# Patient Record
Sex: Male | Born: 1950
Health system: Southern US, Community
[De-identification: ages and names within clinical notes are randomized; demographics above are authoritative.]

## PROBLEM LIST (undated history)

## (undated) DIAGNOSIS — G4733 Obstructive sleep apnea (adult) (pediatric): Secondary | ICD-10-CM

## (undated) DIAGNOSIS — M1990 Unspecified osteoarthritis, unspecified site: Secondary | ICD-10-CM

## (undated) DIAGNOSIS — R351 Nocturia: Secondary | ICD-10-CM

## (undated) DIAGNOSIS — G4730 Sleep apnea, unspecified: Secondary | ICD-10-CM

## (undated) DIAGNOSIS — E785 Hyperlipidemia, unspecified: Secondary | ICD-10-CM

## (undated) DIAGNOSIS — C801 Malignant (primary) neoplasm, unspecified: Secondary | ICD-10-CM

## (undated) DIAGNOSIS — E119 Type 2 diabetes mellitus without complications: Secondary | ICD-10-CM

## (undated) DIAGNOSIS — R7303 Prediabetes: Secondary | ICD-10-CM

## (undated) DIAGNOSIS — N471 Phimosis: Secondary | ICD-10-CM

## (undated) DIAGNOSIS — I10 Essential (primary) hypertension: Secondary | ICD-10-CM

## (undated) DIAGNOSIS — E7800 Pure hypercholesterolemia, unspecified: Secondary | ICD-10-CM

## (undated) DIAGNOSIS — Z973 Presence of spectacles and contact lenses: Secondary | ICD-10-CM

## (undated) HISTORY — PX: ROTATOR CUFF REPAIR: SHX139

## (undated) HISTORY — DX: Sleep apnea, unspecified: G47.30

## (undated) HISTORY — PX: APPENDECTOMY: SHX54

## (undated) HISTORY — DX: Malignant (primary) neoplasm, unspecified: C80.1

---

## 2001-01-27 ENCOUNTER — Ambulatory Visit (HOSPITAL_COMMUNITY): Admission: RE | Admit: 2001-01-27 | Discharge: 2001-01-27 | Payer: Self-pay | Admitting: General Surgery

## 2001-08-25 ENCOUNTER — Ambulatory Visit (HOSPITAL_COMMUNITY): Admission: RE | Admit: 2001-08-25 | Discharge: 2001-08-25 | Payer: Self-pay | Admitting: Family Medicine

## 2001-08-25 ENCOUNTER — Encounter: Payer: Self-pay | Admitting: Family Medicine

## 2002-05-18 ENCOUNTER — Ambulatory Visit (HOSPITAL_COMMUNITY): Admission: RE | Admit: 2002-05-18 | Discharge: 2002-05-18 | Payer: Self-pay | Admitting: Family Medicine

## 2002-05-18 ENCOUNTER — Encounter: Payer: Self-pay | Admitting: Family Medicine

## 2011-07-26 ENCOUNTER — Emergency Department (HOSPITAL_COMMUNITY)
Admission: EM | Admit: 2011-07-26 | Discharge: 2011-07-27 | Disposition: A | Discharge status: Home / Self Care | Attending: Emergency Medicine | Admitting: Emergency Medicine

## 2011-07-26 ENCOUNTER — Encounter (HOSPITAL_COMMUNITY): Payer: Self-pay | Admitting: *Deleted

## 2011-07-26 DIAGNOSIS — S40029A Contusion of unspecified upper arm, initial encounter: Secondary | ICD-10-CM

## 2011-07-26 DIAGNOSIS — E7800 Pure hypercholesterolemia, unspecified: Secondary | ICD-10-CM | POA: Insufficient documentation

## 2011-07-26 DIAGNOSIS — X58XXXA Exposure to other specified factors, initial encounter: Secondary | ICD-10-CM | POA: Insufficient documentation

## 2011-07-26 DIAGNOSIS — Z7982 Long term (current) use of aspirin: Secondary | ICD-10-CM | POA: Insufficient documentation

## 2011-07-26 DIAGNOSIS — I10 Essential (primary) hypertension: Secondary | ICD-10-CM | POA: Insufficient documentation

## 2011-07-26 DIAGNOSIS — S5010XA Contusion of unspecified forearm, initial encounter: Secondary | ICD-10-CM | POA: Insufficient documentation

## 2011-07-26 DIAGNOSIS — F17200 Nicotine dependence, unspecified, uncomplicated: Secondary | ICD-10-CM | POA: Insufficient documentation

## 2011-07-26 HISTORY — DX: Pure hypercholesterolemia, unspecified: E78.00

## 2011-07-26 HISTORY — DX: Prediabetes: R73.03

## 2011-07-26 HISTORY — DX: Essential (primary) hypertension: I10

## 2011-07-26 NOTE — ED Notes (Signed)
Pt states he was getting into the shower and noticed that his left elbow was swollen. Pt states has not happened before. Pt denies injury or pain, just when it happened left elbow felt tight.

## 2011-07-27 LAB — PROTIME-INR
INR: 0.93 (ref 0.00–1.49)
Prothrombin Time: 12.7 seconds (ref 11.6–15.2)

## 2011-07-27 LAB — CBC WITH DIFFERENTIAL/PLATELET
Basophils Absolute: 0 10*3/uL (ref 0.0–0.1)
Basophils Relative: 1 % (ref 0–1)
Eosinophils Absolute: 0.2 10*3/uL (ref 0.0–0.7)
Eosinophils Relative: 4 % (ref 0–5)
HCT: 43 % (ref 39.0–52.0)
MCH: 29.3 pg (ref 26.0–34.0)
MCHC: 33.5 g/dL (ref 30.0–36.0)
MCV: 87.6 fL (ref 78.0–100.0)
Monocytes Absolute: 0.4 10*3/uL (ref 0.1–1.0)
RDW: 14.2 % (ref 11.5–15.5)

## 2011-07-27 LAB — COMPREHENSIVE METABOLIC PANEL
AST: 20 U/L (ref 0–37)
Albumin: 3.9 g/dL (ref 3.5–5.2)
Calcium: 9.3 mg/dL (ref 8.4–10.5)
Creatinine, Ser: 0.87 mg/dL (ref 0.50–1.35)

## 2011-07-27 NOTE — ED Provider Notes (Signed)
History     CSN: [id number]  Arrival date & time 07/26/11  2224   None     Chief Complaint  Patient presents with  . Joint Swelling    (Consider location/radiation/quality/duration/timing/severity/associated sxs/prior treatment) HPI Comments: Patient is a small area of bruising on the medial aspect of his left upper arm and elbow swelling.  Tonight, when he was about to take a shower designs any pain or trauma.  He does take an aspirin a day as her regular medical care through his physician.  He states he did not work today as he had the day off and he, basically napped all day  The history is provided by the patient.    Past Medical History  Diagnosis Date  . Hypertension   . High cholesterol   . Borderline diabetic     History reviewed. No pertinent past surgical history.  History reviewed. No pertinent family history.  History  Substance Use Topics  . Smoking status: Current Everyday Smoker    Types: Cigars  . Smokeless tobacco: Not on file  . Alcohol Use: Yes      Review of Systems  Constitutional: Negative for fever and activity change.  Musculoskeletal: Positive for joint swelling.  Skin: Negative for wound.  Neurological: Negative for dizziness, weakness and numbness.    Allergies  Review of patient's allergies indicates no known allergies.  Home Medications   Current Outpatient Rx  Name Route Sig Dispense Refill  . AMLODIPINE BESY-BENAZEPRIL HCL 10-20 MG PO CAPS Oral Take 1 capsule by mouth daily.    . ASPIRIN EC 81 MG PO TBEC Oral Take 81 mg by mouth daily.    . ATORVASTATIN CALCIUM 20 MG PO TABS Oral Take 20 mg by mouth daily.    . COLESEVELAM HCL 625 MG PO TABS Oral Take 1,875 mg by mouth 2 (two) times daily with a meal.    . METHOCARBAMOL 500 MG PO TABS Oral Take 500 mg by mouth every 6 (six) hours as needed. For spasms    . ADULT MULTIVITAMIN W/MINERALS CH Oral Take 1 tablet by mouth daily.    Marland Kitchen PIOGLITAZONE HCL-METFORMIN ER 30-1000 MG PO TB24  Oral Take 1 tablet by mouth daily.      BP 153/78  Pulse 94  Temp 98.2 F (36.8 C) (Oral)  Resp 16  SpO2 97%  Physical Exam  Constitutional: He appears well-developed and well-nourished.  HENT:  Head: Normocephalic.  Neck: Normal range of motion.  Cardiovascular: Normal rate.   Pulmonary/Chest: He is in respiratory distress.  Musculoskeletal: Normal range of motion. He exhibits edema. He exhibits no tenderness.       Arms: Skin: Skin is warm. No erythema.    ED Course  Procedures (including critical care time)  Labs Reviewed  COMPREHENSIVE METABOLIC PANEL - Abnormal; Notable for the following:    Glucose, Bld 147 (*)     Total Bilirubin 0.2 (*)     All other components within normal limits  CBC WITH DIFFERENTIAL  PROTIME-INR   No results found.   1. Hematoma of arm       MDM   Discussed lab findings with Dr. on her as well as physical findings.  I discussed this with patient and his wife is well, and stress.  The fact that if he develops, warm to the joint.  Decreased range of motion, pain, numbness or tingling distal to the hematoma site.  He is to return immediately to the emergency department for further  evaluation.  Otherwise, followup with his primary care physician on Monday        Arman Filter, NP 07/27/11 0103  Arman Filter, NP 07/27/11 0104

## 2011-07-27 NOTE — ED Notes (Signed)
Pt for discharge.GCS 15 

## 2011-07-27 NOTE — ED Provider Notes (Signed)
Medical screening examination/treatment/procedure(s) were performed by non-physician practitioner and as supervising physician I was immediately available for consultation/collaboration.  Kendall Justo M Canon Gola, MD 07/27/11 2153 

## 2014-05-04 ENCOUNTER — Ambulatory Visit (HOSPITAL_BASED_OUTPATIENT_CLINIC_OR_DEPARTMENT_OTHER): Attending: Family Medicine

## 2014-05-04 VITALS — Ht 71.0 in | Wt 265.0 lb

## 2014-05-04 DIAGNOSIS — G4710 Hypersomnia, unspecified: Secondary | ICD-10-CM | POA: Diagnosis present

## 2014-05-04 DIAGNOSIS — G4733 Obstructive sleep apnea (adult) (pediatric): Secondary | ICD-10-CM | POA: Diagnosis not present

## 2014-05-04 DIAGNOSIS — R0683 Snoring: Secondary | ICD-10-CM | POA: Insufficient documentation

## 2014-05-04 DIAGNOSIS — E119 Type 2 diabetes mellitus without complications: Secondary | ICD-10-CM

## 2014-05-06 DIAGNOSIS — E119 Type 2 diabetes mellitus without complications: Secondary | ICD-10-CM | POA: Diagnosis not present

## 2014-05-06 NOTE — Sleep Study (Signed)
   NAME: Maurice Fox DATE OF BIRTH:  1950-07-03 MEDICAL RECORD NUMBER [id number]  LOCATION: [facility]  PHYSICIAN: YOUNG,CLINTON D  DATE OF STUDY: 05/04/2014  SLEEP STUDY TYPE: Nocturnal Polysomnogram               REFERRING PHYSICIAN: Talmadge Chad, MD  INDICATION FOR STUDY: Hypersomnia with sleep apnea  EPWORTH SLEEPINESS SCORE:   6/24 HEIGHT: 5\' 11"  (180.3 cm)  WEIGHT: 265 lb (120.203 kg)    Body mass index is 36.98 kg/(m^2).  NECK SIZE: 19 in.  MEDICATIONS: Charted for review  SLEEP ARCHITECTURE: Total sleep time 242 minutes with sleep efficiency 61.3%. Stage I was 7%, stage II 68%, stage III absent, REM 25% of total sleep time. Sleep latency 37.5 minutes, REM latency 227.5 minutes, awake after sleep onset 110.5 minutes, arousal index 4.5, bedtime medication: None. Significant difficulty maintaining sleep until after 2 AM.  RESPIRATORY DATA: Apnea hypopnea index (AHI) 17.9 per hour. 72 total events scored including 39 obstructive apneas, 9 central apneas, 4 mixed apneas, 20 hypopneas. All events were while nonsupine. REM AHI 44.6 per hour. There was not enough early sleep to permit application of split protocol CPAP titration.  OXYGEN DATA: Loud snoring with oxygen desaturation to a nadir of 86% and mean saturation 95.4% on room air  CARDIAC DATA: Sinus rhythm with PVCs  MOVEMENT/PARASOMNIA: A few incidental limb jerks were noted with no effect on sleep. Bathroom 1  IMPRESSION/ RECOMMENDATION:   1) Moderate obstructive sleep apnea/hypopnea syndrome, AHI 17.9 per hour with nonsupine events. REM AHI 44.6 per hour. Loud snoring with oxygen desaturation to a nadir of 86% and mean saturation 95.4% on room air. 2) The patient had significant difficulty maintaining sleep until after 2 AM. This prevented application of split protocol CPAP titration on this study night. The patient can return for a dedicated CPAP titration study if appropriate.   Deneise Lever Diplomate, American Board of Sleep Medicine  ELECTRONICALLY SIGNED ON:  05/06/2014, 12:08 PM [place] PH: [phone]   FX: [phone] [place]

## 2019-11-22 ENCOUNTER — Other Ambulatory Visit: Payer: Self-pay | Admitting: Urology

## 2019-11-23 ENCOUNTER — Encounter (HOSPITAL_BASED_OUTPATIENT_CLINIC_OR_DEPARTMENT_OTHER): Payer: Self-pay | Admitting: Urology

## 2019-11-23 ENCOUNTER — Other Ambulatory Visit: Payer: Self-pay

## 2019-11-23 NOTE — Progress Notes (Addendum)
ADDENDUM:  Pt called back via phone with medication list, updated in epic.  Pt will take norvasc, lipitor, flomax, and do nasal spray/ eye drops as usual morning of surgery.  Do not take diabetic medication morning of surgery.   Spoke w/ via phone for pre-op interview--- PT Lab needs dos---- Istat and EKG              Lab results------ no COVID test ------ 11-25-2019 @ 0815 Arrive at ------- 2395 NPO after MN NO Solid Food.  Clear liquids from MN until--- 0945 Medications to take morning of surgery ----- pt to call back today after work @ 1630 to complete med list Diabetic medication -----  Patient Special Instructions -----asked to bring cpap/ mask/ tubing dos Pre-Op special Istructions ----- n/a Patient verbalized understanding of instructions that were given at this phone interview. Patient denies shortness of breath, chest pain, fever, cough at this phone interview.

## 2019-11-25 ENCOUNTER — Other Ambulatory Visit (HOSPITAL_COMMUNITY)
Admission: RE | Admit: 2019-11-25 | Discharge: 2019-11-25 | Disposition: A | Discharge status: Home / Self Care | Payer: Medicare HMO | Source: Ambulatory Visit | Attending: Urology | Admitting: Urology

## 2019-11-25 DIAGNOSIS — Z8042 Family history of malignant neoplasm of prostate: Secondary | ICD-10-CM | POA: Diagnosis not present

## 2019-11-25 DIAGNOSIS — Z01812 Encounter for preprocedural laboratory examination: Secondary | ICD-10-CM | POA: Insufficient documentation

## 2019-11-25 DIAGNOSIS — E119 Type 2 diabetes mellitus without complications: Secondary | ICD-10-CM | POA: Diagnosis not present

## 2019-11-25 DIAGNOSIS — R234 Changes in skin texture: Secondary | ICD-10-CM | POA: Diagnosis not present

## 2019-11-25 DIAGNOSIS — Z7984 Long term (current) use of oral hypoglycemic drugs: Secondary | ICD-10-CM | POA: Diagnosis not present

## 2019-11-25 DIAGNOSIS — Z20822 Contact with and (suspected) exposure to covid-19: Secondary | ICD-10-CM | POA: Diagnosis not present

## 2019-11-25 DIAGNOSIS — F17200 Nicotine dependence, unspecified, uncomplicated: Secondary | ICD-10-CM | POA: Diagnosis not present

## 2019-11-25 DIAGNOSIS — N481 Balanitis: Secondary | ICD-10-CM | POA: Diagnosis not present

## 2019-11-25 DIAGNOSIS — I10 Essential (primary) hypertension: Secondary | ICD-10-CM | POA: Diagnosis not present

## 2019-11-25 DIAGNOSIS — N471 Phimosis: Secondary | ICD-10-CM | POA: Diagnosis present

## 2019-11-25 LAB — SARS CORONAVIRUS 2 (TAT 6-24 HRS): SARS Coronavirus 2: NEGATIVE

## 2019-11-28 NOTE — H&P (Signed)
Patient is a 69 year old African American male seen today for evaluation of foreskin issue. The patient states that over the last few months he has noted some tearing of the overlying penile foreskin after intercourse. The foreskin is now more difficult to retract and is painful to retract. Here for evaluation and management.  Second issue is that of erectile dysfunction. The patient has had difficulty maintaining adequate erections for intercourse. He has tried generic sildenafil through his PCP provider and this seems to work for him but I think he is having some issues with the effectiveness due to food absorption problems. He wanted to look at other options for management. We discussed options including vacuum erection device injection therapy Muse therapy IPP. He is not in a monogamous relationship and most of these would not be satisfactory for him.     ALLERGIES: None   MEDICATIONS: Aspirin 81 mg tablet,chewable  Tamsulosin Hcl 0.4 mg capsule  Amlodipine Besylate 5 mg tablet  Atorvastatin Calcium 10 mg tablet  Benazepril Hcl 40 mg tablet  Centrum Silver  Metformin Er Gastric 500 mg tablet, er gastric retention 24 hr  Modafinil 200 mg tablet     GU PSH: None   NON-GU PSH: Appendectomy Rotator cuff surgery     GU PMH: None   NON-GU PMH: Arthritis Diabetes Type 2 GERD Hypercholesterolemia Hypertension Sleep Apnea    FAMILY HISTORY: 2 sons - Runs in Family Prostate Cancer - Brother   SOCIAL HISTORY: Marital Status: Divorced Preferred Language: English; Ethnicity: Not Hispanic Or Latino; Race: Black or African American Current Smoking Status: Patient has never smoked.   Tobacco Use Assessment Completed: Used Tobacco in last 30 days? Does not drink anymore.  Drinks 2 caffeinated drinks per day.    REVIEW OF SYSTEMS:    GU Review Male:   Patient reports frequent urination, hard to postpone urination, get up at night to urinate, leakage of urine, stream starts and stops,  and erection problems. Patient denies burning/ pain with urination, trouble starting your stream, have to strain to urinate , and penile pain.  Gastrointestinal (Upper):   Patient denies nausea, vomiting, and indigestion/ heartburn.  Gastrointestinal (Lower):   Patient denies diarrhea and constipation.  Constitutional:   Patient denies fever, fatigue, weight loss, and night sweats.  Skin:   Patient denies skin rash/ lesion and itching.  Eyes:   Patient denies blurred vision and double vision.  Ears/ Nose/ Throat:   Patient reports sinus problems. Patient denies sore throat.  Hematologic/Lymphatic:   Patient denies swollen glands and easy bruising.  Cardiovascular:   Patient denies leg swelling and chest pains.  Respiratory:   Patient reports cough. Patient denies shortness of breath.  Endocrine:   Patient denies excessive thirst.  Musculoskeletal:   Patient denies back pain and joint pain.  Neurological:   Patient reports dizziness. Patient denies headaches.  Psychologic:   Patient denies depression and anxiety.   VITAL SIGNS:      11/17/2019 08:33 AM  Weight 245 lb / 111.13 kg  Height 70 in / 177.8 cm  BP 160/81 mmHg  Pulse 93 /min  Temperature 98.4 F / 36.8 C  BMI 35.2 kg/m   GU PHYSICAL EXAMINATION:    Scrotum: No lesions. No edema. No cysts. No warts.  Epididymides: Right: no spermatocele, no masses, no cysts, no tenderness, no induration, no enlargement. Left: no spermatocele, no masses, no cysts, no tenderness, no induration, no enlargement.  Testes: No tenderness, no swelling, no enlargement left testes. No  tenderness, no swelling, no enlargement right testes. Normal location left testes. Normal location right testes. No mass, no cyst, no varicocele, no hydrocele left testes. No mass, no cyst, no varicocele, no hydrocele right testes.  Urethral Meatus: Normal size. No lesion, no wart, no discharge, no polyp. Normal location.  Penis: Uncircumcised penis. There is some cracking of  the penile foreskin dorsally and when the foreskin is retracted put she had stress on this area is painful for the patient. Has some mild balanitis noted as well no other glanular lesions noted.   MULTI-SYSTEM PHYSICAL EXAMINATION:    Constitutional: Well-nourished. No physical deformities. Normally developed. Good grooming.  Neck: Neck symmetrical, not swollen. Normal tracheal position.  Respiratory: No labored breathing, no use of accessory muscles.   Cardiovascular: Normal temperature, normal extremity pulses, no swelling, no varicosities.  Lymphatic: No enlargement of neck, axillae, groin.  Skin: No paleness, no jaundice, no cyanosis. No lesion, no ulcer, no rash.  Neurologic / Psychiatric: Oriented to time, oriented to place, oriented to person. No depression, no anxiety, no agitation.  Eyes: Normal conjunctivae. Normal eyelids.  Ears, Nose, Mouth, and Throat: Left ear no scars, no lesions, no masses. Right ear no scars, no lesions, no masses. Nose no scars, no lesions, no masses. Normal hearing. Normal lips.  Musculoskeletal: Normal gait and station of head and neck.     PAST DATA REVIEW: None   PROCEDURES:          Urinalysis w/Scope - 81001 Dipstick Dipstick Cont'd Micro  Color: Yellow Bilirubin: Neg mg/dL WBC/hpf: 0 - 5/hpf  Appearance: Clear Ketones: Neg mg/dL RBC/hpf: 3 - 10/hpf  Specific Gravity: 1.025 Blood: 1+ ery/uL Bacteria: NS (Not Seen)  pH: 5.5 Protein: Trace mg/dL Cystals: NS (Not Seen)  Glucose: 2+ mg/dL Urobilinogen: 0.2 mg/dL Casts: NS (Not Seen)    Nitrites: Neg Trichomonas: Not Present    Leukocyte Esterase: Neg leu/uL Mucous: Not Present      Epithelial Cells: NS (Not Seen)      Yeast: NS (Not Seen)      Sperm: Not Present    ASSESSMENT:      ICD-10 Details  1 GU:   ED due to arterial insufficiency - N52.01 Chronic, Worsening  2   Phimosis - C62.3 Acute, Complicated Injury   PLAN:            Medications New Meds: Tadalafil 20 mg tablet 1 tablet PO  Daily PRN   #30  3 Refill(s)            Document Letter(s):  Created for Patient: Clinical Summary         Notes:   We discussed treatment options for the phimosis and cracking foreskin. Based on the severity of the cracking of the foreskin I recommended proceeding with circumcision. Risks and benefits discussed as outlined below. Will schedule accordingly in the near future.  Circumcision consent: I have discussed with the patient the risks and benefits of the procedure including but not limited to bleeding, infection, damage to adjacent structures including the urethra wth possible need for further procedures. Risk of numbness and decreased sensation of the penis, scarring of the penile skin and pain associated with scarring. I have also discussed with the patient the alternatives to circumcision. Patient voices understanding of the risks and benefits of the above procedure and consents.  From ED standpoint we talked about treatment based approach. He does have some response to sildenafil but I think he is having some issues taking  this on a full stomach and not getting is full effectiveness. We are going to try some generic Tadalafil and see how this works for him. Prescription given today.

## 2019-11-29 ENCOUNTER — Ambulatory Visit (HOSPITAL_BASED_OUTPATIENT_CLINIC_OR_DEPARTMENT_OTHER): Payer: Medicare HMO | Admitting: Anesthesiology

## 2019-11-29 ENCOUNTER — Encounter (HOSPITAL_BASED_OUTPATIENT_CLINIC_OR_DEPARTMENT_OTHER): Payer: Self-pay | Admitting: Urology

## 2019-11-29 ENCOUNTER — Encounter (HOSPITAL_BASED_OUTPATIENT_CLINIC_OR_DEPARTMENT_OTHER)
Admission: RE | Disposition: A | Discharge status: Home / Self Care | Payer: Self-pay | Source: Home / Self Care | Attending: Urology

## 2019-11-29 ENCOUNTER — Ambulatory Visit (HOSPITAL_BASED_OUTPATIENT_CLINIC_OR_DEPARTMENT_OTHER)
Admission: RE | Admit: 2019-11-29 | Discharge: 2019-11-29 | Disposition: A | Discharge status: Home / Self Care | Payer: Medicare HMO | Attending: Urology | Admitting: Urology

## 2019-11-29 ENCOUNTER — Other Ambulatory Visit: Payer: Self-pay

## 2019-11-29 DIAGNOSIS — R234 Changes in skin texture: Secondary | ICD-10-CM | POA: Insufficient documentation

## 2019-11-29 DIAGNOSIS — Z7984 Long term (current) use of oral hypoglycemic drugs: Secondary | ICD-10-CM | POA: Insufficient documentation

## 2019-11-29 DIAGNOSIS — N471 Phimosis: Secondary | ICD-10-CM | POA: Diagnosis present

## 2019-11-29 DIAGNOSIS — E119 Type 2 diabetes mellitus without complications: Secondary | ICD-10-CM | POA: Diagnosis not present

## 2019-11-29 DIAGNOSIS — Z8042 Family history of malignant neoplasm of prostate: Secondary | ICD-10-CM | POA: Diagnosis not present

## 2019-11-29 DIAGNOSIS — F17200 Nicotine dependence, unspecified, uncomplicated: Secondary | ICD-10-CM | POA: Insufficient documentation

## 2019-11-29 DIAGNOSIS — I10 Essential (primary) hypertension: Secondary | ICD-10-CM | POA: Diagnosis not present

## 2019-11-29 DIAGNOSIS — N481 Balanitis: Secondary | ICD-10-CM | POA: Diagnosis not present

## 2019-11-29 HISTORY — DX: Unspecified osteoarthritis, unspecified site: M19.90

## 2019-11-29 HISTORY — DX: Obstructive sleep apnea (adult) (pediatric): G47.33

## 2019-11-29 HISTORY — DX: Nocturia: R35.1

## 2019-11-29 HISTORY — DX: Presence of spectacles and contact lenses: Z97.3

## 2019-11-29 HISTORY — DX: Phimosis: N47.1

## 2019-11-29 HISTORY — DX: Hyperlipidemia, unspecified: E78.5

## 2019-11-29 HISTORY — DX: Type 2 diabetes mellitus without complications: E11.9

## 2019-11-29 HISTORY — PX: CIRCUMCISION: SHX1350

## 2019-11-29 LAB — POCT I-STAT, CHEM 8
BUN: 20 mg/dL (ref 8–23)
Calcium, Ion: 1.18 mmol/L (ref 1.15–1.40)
Chloride: 107 mmol/L (ref 98–111)
Creatinine, Ser: 0.8 mg/dL (ref 0.61–1.24)
Glucose, Bld: 121 mg/dL — ABNORMAL HIGH (ref 70–99)
HCT: 54 % — ABNORMAL HIGH (ref 39.0–52.0)
Hemoglobin: 18.4 g/dL — ABNORMAL HIGH (ref 13.0–17.0)
Potassium: 3.6 mmol/L (ref 3.5–5.1)
Sodium: 143 mmol/L (ref 135–145)
TCO2: 23 mmol/L (ref 22–32)

## 2019-11-29 LAB — GLUCOSE, CAPILLARY: Glucose-Capillary: 128 mg/dL — ABNORMAL HIGH (ref 70–99)

## 2019-11-29 SURGERY — CIRCUMCISION, ADULT
Anesthesia: General | Site: Penis

## 2019-11-29 MED ORDER — OXYCODONE HCL 5 MG PO TABS: 5.0000 mg | ORAL_TABLET | Freq: Once | ORAL | Status: DC | PRN

## 2019-11-29 MED ORDER — BACIT-POLY-NEO HC 1 % EX OINT
TOPICAL_OINTMENT | CUTANEOUS | Status: DC | PRN
  Administered 2019-11-29: 1

## 2019-11-29 MED ORDER — FENTANYL CITRATE (PF) 100 MCG/2ML IJ SOLN
INTRAMUSCULAR | Status: DC | PRN
  Administered 2019-11-29 (×2): 50 ug via INTRAVENOUS

## 2019-11-29 MED ORDER — ONDANSETRON HCL 4 MG/2ML IJ SOLN
INTRAMUSCULAR | Status: AC
  Filled 2019-11-29: qty 2

## 2019-11-29 MED ORDER — DEXAMETHASONE SODIUM PHOSPHATE 10 MG/ML IJ SOLN
INTRAMUSCULAR | Status: DC | PRN
  Administered 2019-11-29: 5 mg via INTRAVENOUS

## 2019-11-29 MED ORDER — PROPOFOL 10 MG/ML IV BOLUS
INTRAVENOUS | Status: DC | PRN
  Administered 2019-11-29: 200 mg via INTRAVENOUS

## 2019-11-29 MED ORDER — PROPOFOL 10 MG/ML IV BOLUS
INTRAVENOUS | Status: AC
  Filled 2019-11-29: qty 20

## 2019-11-29 MED ORDER — LIDOCAINE 2% (20 MG/ML) 5 ML SYRINGE
INTRAMUSCULAR | Status: AC
  Filled 2019-11-29: qty 5

## 2019-11-29 MED ORDER — ONDANSETRON HCL 4 MG/2ML IJ SOLN: 4.0000 mg | Freq: Once | INTRAMUSCULAR | Status: DC | PRN

## 2019-11-29 MED ORDER — ONDANSETRON HCL 4 MG/2ML IJ SOLN
INTRAMUSCULAR | Status: DC | PRN
  Administered 2019-11-29: 4 mg via INTRAVENOUS

## 2019-11-29 MED ORDER — OXYCODONE-ACETAMINOPHEN 5-325 MG PO TABS
1.0000 | ORAL_TABLET | ORAL | 0 refills | Status: DC | PRN
Start: 2019-11-29 — End: 2020-07-10

## 2019-11-29 MED ORDER — FENTANYL CITRATE (PF) 100 MCG/2ML IJ SOLN
INTRAMUSCULAR | Status: AC
  Filled 2019-11-29: qty 2

## 2019-11-29 MED ORDER — HYDROMORPHONE HCL 1 MG/ML IJ SOLN: 0.2500 mg | INTRAMUSCULAR | Status: DC | PRN

## 2019-11-29 MED ORDER — OXYCODONE HCL 5 MG/5ML PO SOLN: 5.0000 mg | Freq: Once | ORAL | Status: DC | PRN

## 2019-11-29 MED ORDER — LACTATED RINGERS IV SOLN: INTRAVENOUS | Status: DC

## 2019-11-29 MED ORDER — CEFAZOLIN SODIUM-DEXTROSE 2-4 GM/100ML-% IV SOLN
2.0000 g | INTRAVENOUS | Status: AC
  Administered 2019-11-29: 2 g via INTRAVENOUS

## 2019-11-29 MED ORDER — ACETAMINOPHEN 10 MG/ML IV SOLN: 1000.0000 mg | Freq: Once | INTRAVENOUS | Status: DC | PRN

## 2019-11-29 MED ORDER — BUPIVACAINE HCL 0.5 % IJ SOLN
INTRAMUSCULAR | Status: DC | PRN
  Administered 2019-11-29: 20 mL

## 2019-11-29 MED ORDER — DEXAMETHASONE SODIUM PHOSPHATE 10 MG/ML IJ SOLN
INTRAMUSCULAR | Status: AC
  Filled 2019-11-29: qty 1

## 2019-11-29 MED ORDER — LIDOCAINE 2% (20 MG/ML) 5 ML SYRINGE
INTRAMUSCULAR | Status: DC | PRN
  Administered 2019-11-29: 60 mg via INTRAVENOUS

## 2019-11-29 MED ORDER — CEFAZOLIN SODIUM-DEXTROSE 2-4 GM/100ML-% IV SOLN
INTRAVENOUS | Status: AC
  Filled 2019-11-29: qty 100

## 2019-11-29 SURGICAL SUPPLY — 34 items
BLADE CLIPPER SENSICLIP SURGIC (BLADE) IMPLANT
BLADE SURG 15 STRL LF DISP TIS (BLADE) ×1 IMPLANT
BLADE SURG 15 STRL SS (BLADE) ×2
BNDG COHESIVE 1X5 TAN STRL LF (GAUZE/BANDAGES/DRESSINGS) ×2 IMPLANT
BNDG COHESIVE 2X5 TAN STRL LF (GAUZE/BANDAGES/DRESSINGS) ×1 IMPLANT
BNDG CONFORM 2 STRL LF (GAUZE/BANDAGES/DRESSINGS) ×2 IMPLANT
CLEANER CAUTERY TIP 5X5 PAD (MISCELLANEOUS) IMPLANT
COVER BACK TABLE 60X90IN (DRAPES) ×2 IMPLANT
COVER MAYO STAND STRL (DRAPES) ×2 IMPLANT
COVER WAND RF STERILE (DRAPES) ×2 IMPLANT
DRAPE LAPAROTOMY 100X72 PEDS (DRAPES) ×2 IMPLANT
ELECT REM PT RETURN 9FT ADLT (ELECTROSURGICAL) ×2
ELECTRODE REM PT RTRN 9FT ADLT (ELECTROSURGICAL) ×1 IMPLANT
GAUZE PETROLATUM 1 X8 (GAUZE/BANDAGES/DRESSINGS) ×2 IMPLANT
GAUZE SPONGE 4X4 12PLY STRL (GAUZE/BANDAGES/DRESSINGS) ×2 IMPLANT
GLOVE BIO SURGEON STRL SZ7.5 (GLOVE) ×2 IMPLANT
GLOVE SURG SS PI 7.0 STRL IVOR (GLOVE) ×1 IMPLANT
GOWN STRL REUS W/TWL LRG LVL3 (GOWN DISPOSABLE) ×1 IMPLANT
GOWN STRL REUS W/TWL XL LVL3 (GOWN DISPOSABLE) ×2 IMPLANT
KIT TURNOVER CYSTO (KITS) ×2 IMPLANT
NEEDLE HYPO 25X1 1.5 SAFETY (NEEDLE) ×2 IMPLANT
NS IRRIG 500ML POUR BTL (IV SOLUTION) ×2 IMPLANT
PACK BASIN DAY SURGERY FS (CUSTOM PROCEDURE TRAY) ×2 IMPLANT
PAD CLEANER CAUTERY TIP 5X5 (MISCELLANEOUS) ×1
PENCIL SMOKE EVACUATOR (MISCELLANEOUS) ×2 IMPLANT
SUT CHROMIC 3 0 PS 2 (SUTURE) IMPLANT
SUT CHROMIC 3 0 SH 27 (SUTURE) ×2 IMPLANT
SUT CHROMIC 4 0 RB 1X27 (SUTURE) ×2 IMPLANT
SUT SILK 2 0 TIES 17X18 (SUTURE)
SUT SILK 2-0 18XBRD TIE BLK (SUTURE) IMPLANT
SYR CONTROL 10ML LL (SYRINGE) IMPLANT
TOWEL OR 17X26 10 PK STRL BLUE (TOWEL DISPOSABLE) ×2 IMPLANT
TRAY DSU PREP LF (CUSTOM PROCEDURE TRAY) ×2 IMPLANT
WATER STERILE IRR 500ML POUR (IV SOLUTION) ×2 IMPLANT

## 2019-11-29 NOTE — Op Note (Signed)
Operative report  Preop diagnosis: Phimosis and chronic balanitis Postop diagnosis: Phimosis and chronic balanitis Procedure: Circumcision Surgeon Milford Cage Anesthesia: General Estimated blood loss: Minimal Operative findings: Tight foreskin circumcision performed without difficulty. Operative note: After obtaining for consent for the patient is taken the major OR suite placed under general anesthesia.  Genitalia prepped draped in usual sterile fashion.  Proper pause and timeout was performed.  The coronal sulcus could be seen through the overlying foreskin was marked with a marking pen.  Circumcising incision was then made over this line.  The foreskin was then retracted and a similar circumcising incision was made approximately 2 mm beneath the coronal sulcus circumferentially.  The ring of skin between the 2 incisions was sharply dissected free in the avascular plane and excised.  The proximal shaft skin was then reapproximated to the skin but beneath the coronal sulcus utilizing 4 and 3-0 chromic sutures taking care to align the frenular line ventrally.  Good hemostasis was noted.  A Vaseline gauze wrap and compression wrap with Kerlix and Coban was then placed at the termination of the case.  Dorsal block with half percent Marcaine was placed at the termination of the case for postoperative analgesia.  He tolerated procedure awakened from anesthesia and returned to the recovery room in stable condition.  No immediate complication from the procedure

## 2019-11-29 NOTE — Anesthesia Postprocedure Evaluation (Signed)
Anesthesia Post Note  Patient: SOUA CALTAGIRONE  Procedure(s) Performed: CIRCUMCISION ADULT (N/A Penis)     Patient location during evaluation: PACU Anesthesia Type: General Level of consciousness: awake and alert Pain management: pain level controlled Vital Signs Assessment: post-procedure vital signs reviewed and stable Respiratory status: spontaneous breathing, nonlabored ventilation and respiratory function stable Cardiovascular status: blood pressure returned to baseline and stable Postop Assessment: no apparent nausea or vomiting Anesthetic complications: no   No complications documented.  Last Vitals:  Vitals:   11/29/19 1042 11/29/19 1503  BP: (!) 150/93 (!) 155/73  Pulse: 98 (!) 108  Resp: 16 18  Temp: 36.9 C (!) 36.4 C  SpO2: 100% 94%    Last Pain:  Vitals:   11/29/19 1503  TempSrc:   PainSc: 0-No pain                 Lidia Collum

## 2019-11-29 NOTE — Anesthesia Procedure Notes (Signed)
Procedure Name: LMA Insertion Date/Time: 11/29/2019 1:56 PM Performed by: Mechele Claude, CRNA Pre-anesthesia Checklist: Patient identified, Emergency Drugs available, Suction available and Patient being monitored Patient Re-evaluated:Patient Re-evaluated prior to induction Oxygen Delivery Method: Circle system utilized Preoxygenation: Pre-oxygenation with 100% oxygen Induction Type: IV induction Ventilation: Mask ventilation without difficulty LMA: LMA inserted LMA Size: 5.0 Number of attempts: 1 Airway Equipment and Method: Bite block Placement Confirmation: positive ETCO2 Tube secured with: Tape Dental Injury: Teeth and Oropharynx as per pre-operative assessment

## 2019-11-29 NOTE — Interval H&P Note (Signed)
History and Physical Interval Note:  11/29/2019 1:22 PM  Maurice Fox  has presented today for surgery, with the diagnosis of PHIMOSIS.  The various methods of treatment have been discussed with the patient and family. After consideration of risks, benefits and other options for treatment, the patient has consented to  Procedure(s) with comments: CIRCUMCISION ADULT (N/A) - 45 MINS as a surgical intervention.  The patient's history has been reviewed, patient examined, no change in status, stable for surgery.  I have reviewed the patient's chart and labs.  Questions were answered to the patient's satisfaction.     [name]

## 2019-11-29 NOTE — Discharge Instructions (Signed)

## 2019-11-29 NOTE — Anesthesia Preprocedure Evaluation (Signed)
Anesthesia Evaluation  Patient identified by MRN, date of birth, ID band Patient awake    Reviewed: Patient's Chart, lab work & pertinent test results  Airway Mallampati: III  TM Distance: >3 FB Neck ROM: Full    Dental  (+) Poor Dentition, Teeth Intact   Pulmonary sleep apnea and Continuous Positive Airway Pressure Ventilation , Current Smoker,    Pulmonary exam normal        Cardiovascular hypertension, Pt. on medications  Rhythm:Regular Rate:Normal     Neuro/Psych negative neurological ROS  negative psych ROS   GI/Hepatic negative GI ROS, Neg liver ROS,   Endo/Other  diabetes, Well Controlled, Oral Hypoglycemic Agents  Renal/GU negative Renal ROS   phimosis    Musculoskeletal  (+) Arthritis ,   Abdominal (+)  Abdomen: soft. Bowel sounds: normal.  Peds  Hematology negative hematology ROS (+)   Anesthesia Other Findings   Reproductive/Obstetrics                             Anesthesia Physical Anesthesia Plan  ASA: II  Anesthesia Plan: General   Post-op Pain Management:    Induction: Intravenous  PONV Risk Score and Plan: 1 and Ondansetron, Dexamethasone and Treatment may vary due to age or medical condition  Airway Management Planned: Mask and LMA  Additional Equipment: None  Intra-op Plan:   Post-operative Plan: Extubation in OR  Informed Consent: I have reviewed the patients History and Physical, chart, labs and discussed the procedure including the risks, benefits and alternatives for the proposed anesthesia with the patient or authorized representative who has indicated his/her understanding and acceptance.     Dental advisory given  Plan Discussed with: CRNA  Anesthesia Plan Comments: (Lab Results      Component                Value               Date                      WBC                      5.1                 07/26/2011                HGB                       18.4 (H)            11/29/2019                HCT                      54.0 (H)            11/29/2019                MCV                      87.6                07/26/2011                PLT                      222  07/26/2011           Lab Results      Component                Value               Date                      NA                       143                 11/29/2019                K                        3.6                 11/29/2019                CO2                      23                  07/26/2011                GLUCOSE                  121 (H)             11/29/2019                BUN                      20                  11/29/2019                CREATININE               0.80                11/29/2019                CALCIUM                  9.3                 07/26/2011                GFRNONAA                 >90                 07/26/2011                GFRAA                    >90                 07/26/2011          )        Anesthesia Quick Evaluation

## 2019-11-29 NOTE — Transfer of Care (Signed)
Immediate Anesthesia Transfer of Care Note  Patient: Maurice Fox  Procedure(s) Performed: Procedure(s) (LRB): CIRCUMCISION ADULT (N/A)  Patient Location: PACU  Anesthesia Type: General  Level of Consciousness: awake, alert  and oriented  Airway & Oxygen Therapy: Patient Spontanous Breathing and Patient connected to face mask oxygen  Post-op Assessment: Report given to PACU RN and Post -op Vital signs reviewed and stable  Post vital signs: Reviewed and stable  Complications: No apparent anesthesia complications Last Vitals:  Vitals Value Taken Time  BP 155/73 11/29/19 1503  Temp 36.4 C 11/29/19 1503  Pulse 102 11/29/19 1505  Resp 19 11/29/19 1505  SpO2 95 % 11/29/19 1505  Vitals shown include unvalidated device data.  Last Pain:  Vitals:   11/29/19 1042  TempSrc: Oral  PainSc: 0-No pain         Complications: No complications documented.

## 2019-11-30 ENCOUNTER — Encounter (HOSPITAL_BASED_OUTPATIENT_CLINIC_OR_DEPARTMENT_OTHER): Payer: Self-pay | Admitting: Urology

## 2019-11-30 LAB — SURGICAL PATHOLOGY

## 2020-01-26 DIAGNOSIS — G4733 Obstructive sleep apnea (adult) (pediatric): Secondary | ICD-10-CM | POA: Diagnosis not present

## 2020-02-27 DIAGNOSIS — G4733 Obstructive sleep apnea (adult) (pediatric): Secondary | ICD-10-CM | POA: Diagnosis not present

## 2020-03-27 ENCOUNTER — Other Ambulatory Visit: Payer: Self-pay | Admitting: Family Medicine

## 2020-03-27 DIAGNOSIS — E782 Mixed hyperlipidemia: Secondary | ICD-10-CM | POA: Diagnosis not present

## 2020-03-27 DIAGNOSIS — E1143 Type 2 diabetes mellitus with diabetic autonomic (poly)neuropathy: Secondary | ICD-10-CM | POA: Diagnosis not present

## 2020-03-27 DIAGNOSIS — Z720 Tobacco use: Secondary | ICD-10-CM | POA: Diagnosis not present

## 2020-03-27 DIAGNOSIS — Z87891 Personal history of nicotine dependence: Secondary | ICD-10-CM

## 2020-03-27 DIAGNOSIS — N529 Male erectile dysfunction, unspecified: Secondary | ICD-10-CM | POA: Diagnosis not present

## 2020-03-27 DIAGNOSIS — J309 Allergic rhinitis, unspecified: Secondary | ICD-10-CM | POA: Diagnosis not present

## 2020-03-27 DIAGNOSIS — I10 Essential (primary) hypertension: Secondary | ICD-10-CM | POA: Diagnosis not present

## 2020-03-27 DIAGNOSIS — E1165 Type 2 diabetes mellitus with hyperglycemia: Secondary | ICD-10-CM | POA: Diagnosis not present

## 2020-03-28 DIAGNOSIS — I10 Essential (primary) hypertension: Secondary | ICD-10-CM | POA: Diagnosis not present

## 2020-03-28 DIAGNOSIS — R739 Hyperglycemia, unspecified: Secondary | ICD-10-CM | POA: Diagnosis not present

## 2020-03-28 DIAGNOSIS — E782 Mixed hyperlipidemia: Secondary | ICD-10-CM | POA: Diagnosis not present

## 2020-03-28 DIAGNOSIS — M19012 Primary osteoarthritis, left shoulder: Secondary | ICD-10-CM | POA: Diagnosis not present

## 2020-03-28 DIAGNOSIS — M19019 Primary osteoarthritis, unspecified shoulder: Secondary | ICD-10-CM | POA: Diagnosis not present

## 2020-03-28 DIAGNOSIS — E1169 Type 2 diabetes mellitus with other specified complication: Secondary | ICD-10-CM | POA: Diagnosis not present

## 2020-03-28 DIAGNOSIS — Z720 Tobacco use: Secondary | ICD-10-CM | POA: Diagnosis not present

## 2020-03-28 DIAGNOSIS — E1165 Type 2 diabetes mellitus with hyperglycemia: Secondary | ICD-10-CM | POA: Diagnosis not present

## 2020-03-28 DIAGNOSIS — M19011 Primary osteoarthritis, right shoulder: Secondary | ICD-10-CM | POA: Diagnosis not present

## 2020-04-12 ENCOUNTER — Ambulatory Visit
Admission: RE | Admit: 2020-04-12 | Discharge: 2020-04-12 | Disposition: A | Discharge status: Home / Self Care | Payer: Medicare HMO | Source: Ambulatory Visit | Attending: Family Medicine | Admitting: Family Medicine

## 2020-04-12 DIAGNOSIS — Z87891 Personal history of nicotine dependence: Secondary | ICD-10-CM

## 2020-04-12 DIAGNOSIS — F17210 Nicotine dependence, cigarettes, uncomplicated: Secondary | ICD-10-CM | POA: Diagnosis not present

## 2020-04-13 DIAGNOSIS — Z6839 Body mass index (BMI) 39.0-39.9, adult: Secondary | ICD-10-CM | POA: Diagnosis not present

## 2020-04-13 DIAGNOSIS — E1165 Type 2 diabetes mellitus with hyperglycemia: Secondary | ICD-10-CM | POA: Diagnosis not present

## 2020-04-13 DIAGNOSIS — I10 Essential (primary) hypertension: Secondary | ICD-10-CM | POA: Diagnosis not present

## 2020-04-13 DIAGNOSIS — E782 Mixed hyperlipidemia: Secondary | ICD-10-CM | POA: Diagnosis not present

## 2020-04-13 DIAGNOSIS — E6609 Other obesity due to excess calories: Secondary | ICD-10-CM | POA: Diagnosis not present

## 2020-04-16 ENCOUNTER — Other Ambulatory Visit: Payer: Medicare HMO

## 2020-04-16 ENCOUNTER — Ambulatory Visit: Payer: Medicare HMO | Admitting: Hematology and Oncology

## 2020-04-18 ENCOUNTER — Other Ambulatory Visit: Payer: Self-pay | Admitting: Family Medicine

## 2020-04-18 DIAGNOSIS — N289 Disorder of kidney and ureter, unspecified: Secondary | ICD-10-CM

## 2020-04-19 DIAGNOSIS — I10 Essential (primary) hypertension: Secondary | ICD-10-CM | POA: Diagnosis not present

## 2020-04-19 DIAGNOSIS — E782 Mixed hyperlipidemia: Secondary | ICD-10-CM | POA: Diagnosis not present

## 2020-04-23 DIAGNOSIS — I10 Essential (primary) hypertension: Secondary | ICD-10-CM | POA: Diagnosis not present

## 2020-04-23 DIAGNOSIS — E782 Mixed hyperlipidemia: Secondary | ICD-10-CM | POA: Diagnosis not present

## 2020-04-23 DIAGNOSIS — N529 Male erectile dysfunction, unspecified: Secondary | ICD-10-CM | POA: Diagnosis not present

## 2020-04-23 DIAGNOSIS — E1165 Type 2 diabetes mellitus with hyperglycemia: Secondary | ICD-10-CM | POA: Diagnosis not present

## 2020-05-05 ENCOUNTER — Ambulatory Visit
Admission: RE | Admit: 2020-05-05 | Discharge: 2020-05-05 | Disposition: A | Discharge status: Home / Self Care | Payer: Medicare HMO | Source: Ambulatory Visit | Attending: Family Medicine | Admitting: Family Medicine

## 2020-05-05 ENCOUNTER — Other Ambulatory Visit: Payer: Self-pay

## 2020-05-05 DIAGNOSIS — N289 Disorder of kidney and ureter, unspecified: Secondary | ICD-10-CM

## 2020-05-05 DIAGNOSIS — N281 Cyst of kidney, acquired: Secondary | ICD-10-CM | POA: Diagnosis not present

## 2020-05-05 DIAGNOSIS — K8020 Calculus of gallbladder without cholecystitis without obstruction: Secondary | ICD-10-CM | POA: Diagnosis not present

## 2020-05-05 MED ORDER — GADOBENATE DIMEGLUMINE 529 MG/ML IV SOLN
20.0000 mL | Freq: Once | INTRAVENOUS | Status: AC | PRN
  Administered 2020-05-05: 20 mL via INTRAVENOUS

## 2020-05-08 ENCOUNTER — Other Ambulatory Visit: Payer: Self-pay | Admitting: Family Medicine

## 2020-05-08 DIAGNOSIS — N281 Cyst of kidney, acquired: Secondary | ICD-10-CM | POA: Diagnosis not present

## 2020-05-08 DIAGNOSIS — K8080 Other cholelithiasis without obstruction: Secondary | ICD-10-CM

## 2020-05-08 DIAGNOSIS — K8020 Calculus of gallbladder without cholecystitis without obstruction: Secondary | ICD-10-CM | POA: Diagnosis not present

## 2020-05-08 DIAGNOSIS — I10 Essential (primary) hypertension: Secondary | ICD-10-CM | POA: Diagnosis not present

## 2020-05-28 DIAGNOSIS — G4733 Obstructive sleep apnea (adult) (pediatric): Secondary | ICD-10-CM | POA: Diagnosis not present

## 2020-06-04 ENCOUNTER — Ambulatory Visit
Admission: RE | Admit: 2020-06-04 | Discharge: 2020-06-04 | Disposition: A | Discharge status: Home / Self Care | Payer: TRICARE For Life (TFL) | Source: Ambulatory Visit | Attending: Family Medicine | Admitting: Family Medicine

## 2020-06-04 DIAGNOSIS — N281 Cyst of kidney, acquired: Secondary | ICD-10-CM | POA: Diagnosis not present

## 2020-06-04 DIAGNOSIS — K8080 Other cholelithiasis without obstruction: Secondary | ICD-10-CM

## 2020-06-04 DIAGNOSIS — K8020 Calculus of gallbladder without cholecystitis without obstruction: Secondary | ICD-10-CM | POA: Diagnosis not present

## 2020-06-07 DIAGNOSIS — G4733 Obstructive sleep apnea (adult) (pediatric): Secondary | ICD-10-CM | POA: Diagnosis not present

## 2020-06-07 DIAGNOSIS — N281 Cyst of kidney, acquired: Secondary | ICD-10-CM | POA: Diagnosis not present

## 2020-06-07 DIAGNOSIS — K8020 Calculus of gallbladder without cholecystitis without obstruction: Secondary | ICD-10-CM | POA: Diagnosis not present

## 2020-06-15 DIAGNOSIS — I10 Essential (primary) hypertension: Secondary | ICD-10-CM | POA: Diagnosis not present

## 2020-06-15 DIAGNOSIS — Z6834 Body mass index (BMI) 34.0-34.9, adult: Secondary | ICD-10-CM | POA: Diagnosis not present

## 2020-06-15 DIAGNOSIS — E1165 Type 2 diabetes mellitus with hyperglycemia: Secondary | ICD-10-CM | POA: Diagnosis not present

## 2020-06-15 DIAGNOSIS — K8020 Calculus of gallbladder without cholecystitis without obstruction: Secondary | ICD-10-CM | POA: Diagnosis not present

## 2020-06-15 DIAGNOSIS — E6609 Other obesity due to excess calories: Secondary | ICD-10-CM | POA: Diagnosis not present

## 2020-06-22 DIAGNOSIS — G4733 Obstructive sleep apnea (adult) (pediatric): Secondary | ICD-10-CM | POA: Diagnosis not present

## 2020-06-27 ENCOUNTER — Ambulatory Visit: Payer: Self-pay | Admitting: General Surgery

## 2020-06-27 DIAGNOSIS — K8020 Calculus of gallbladder without cholecystitis without obstruction: Secondary | ICD-10-CM | POA: Diagnosis not present

## 2020-07-07 ENCOUNTER — Other Ambulatory Visit: Payer: Self-pay

## 2020-07-07 ENCOUNTER — Emergency Department (HOSPITAL_COMMUNITY): Payer: Medicare HMO

## 2020-07-07 ENCOUNTER — Observation Stay (HOSPITAL_COMMUNITY)
Admission: EM | Admit: 2020-07-07 | Discharge: 2020-07-10 | Disposition: A | Discharge status: Home / Self Care | Payer: Medicare HMO | Attending: General Surgery | Admitting: General Surgery

## 2020-07-07 ENCOUNTER — Encounter (HOSPITAL_COMMUNITY): Payer: Self-pay | Admitting: *Deleted

## 2020-07-07 DIAGNOSIS — R1011 Right upper quadrant pain: Secondary | ICD-10-CM

## 2020-07-07 DIAGNOSIS — J9811 Atelectasis: Secondary | ICD-10-CM | POA: Diagnosis not present

## 2020-07-07 DIAGNOSIS — K810 Acute cholecystitis: Secondary | ICD-10-CM | POA: Diagnosis present

## 2020-07-07 DIAGNOSIS — K8012 Calculus of gallbladder with acute and chronic cholecystitis without obstruction: Secondary | ICD-10-CM | POA: Diagnosis not present

## 2020-07-07 DIAGNOSIS — I10 Essential (primary) hypertension: Secondary | ICD-10-CM | POA: Diagnosis present

## 2020-07-07 DIAGNOSIS — E119 Type 2 diabetes mellitus without complications: Secondary | ICD-10-CM

## 2020-07-07 DIAGNOSIS — Z419 Encounter for procedure for purposes other than remedying health state, unspecified: Secondary | ICD-10-CM

## 2020-07-07 DIAGNOSIS — R7989 Other specified abnormal findings of blood chemistry: Secondary | ICD-10-CM | POA: Diagnosis present

## 2020-07-07 DIAGNOSIS — Z20822 Contact with and (suspected) exposure to covid-19: Secondary | ICD-10-CM | POA: Insufficient documentation

## 2020-07-07 DIAGNOSIS — I517 Cardiomegaly: Secondary | ICD-10-CM | POA: Diagnosis not present

## 2020-07-07 DIAGNOSIS — C8339 Diffuse large B-cell lymphoma, extranodal and solid organ sites: Secondary | ICD-10-CM | POA: Diagnosis not present

## 2020-07-07 DIAGNOSIS — K439 Ventral hernia without obstruction or gangrene: Secondary | ICD-10-CM | POA: Diagnosis not present

## 2020-07-07 DIAGNOSIS — K8010 Calculus of gallbladder with chronic cholecystitis without obstruction: Secondary | ICD-10-CM | POA: Diagnosis not present

## 2020-07-07 DIAGNOSIS — Z79899 Other long term (current) drug therapy: Secondary | ICD-10-CM | POA: Diagnosis not present

## 2020-07-07 DIAGNOSIS — K8050 Calculus of bile duct without cholangitis or cholecystitis without obstruction: Secondary | ICD-10-CM | POA: Diagnosis not present

## 2020-07-07 DIAGNOSIS — R778 Other specified abnormalities of plasma proteins: Secondary | ICD-10-CM | POA: Diagnosis present

## 2020-07-07 DIAGNOSIS — R0789 Other chest pain: Secondary | ICD-10-CM | POA: Diagnosis not present

## 2020-07-07 DIAGNOSIS — C866 Primary cutaneous CD30-positive T-cell proliferations: Secondary | ICD-10-CM | POA: Diagnosis not present

## 2020-07-07 DIAGNOSIS — K8020 Calculus of gallbladder without cholecystitis without obstruction: Secondary | ICD-10-CM | POA: Diagnosis not present

## 2020-07-07 DIAGNOSIS — F17210 Nicotine dependence, cigarettes, uncomplicated: Secondary | ICD-10-CM | POA: Diagnosis not present

## 2020-07-07 DIAGNOSIS — G4733 Obstructive sleep apnea (adult) (pediatric): Secondary | ICD-10-CM

## 2020-07-07 DIAGNOSIS — K8080 Other cholelithiasis without obstruction: Secondary | ICD-10-CM

## 2020-07-07 DIAGNOSIS — R109 Unspecified abdominal pain: Secondary | ICD-10-CM | POA: Diagnosis not present

## 2020-07-07 DIAGNOSIS — E785 Hyperlipidemia, unspecified: Secondary | ICD-10-CM | POA: Diagnosis present

## 2020-07-07 DIAGNOSIS — R079 Chest pain, unspecified: Secondary | ICD-10-CM | POA: Diagnosis not present

## 2020-07-07 LAB — COMPREHENSIVE METABOLIC PANEL
ALT: 25 U/L (ref 0–44)
AST: 60 U/L — ABNORMAL HIGH (ref 15–41)
Albumin: 3.9 g/dL (ref 3.5–5.0)
Alkaline Phosphatase: 95 U/L (ref 38–126)
Anion gap: 8 (ref 5–15)
BUN: 20 mg/dL (ref 8–23)
CO2: 24 mmol/L (ref 22–32)
Calcium: 9.1 mg/dL (ref 8.9–10.3)
Chloride: 106 mmol/L (ref 98–111)
Creatinine, Ser: 1.08 mg/dL (ref 0.61–1.24)
GFR, Estimated: >60 mL/min (ref >60)
Glucose, Bld: 115 mg/dL — ABNORMAL HIGH (ref 70–99)
Potassium: 6.1 mmol/L — ABNORMAL HIGH (ref 3.5–5.1)
Sodium: 138 mmol/L (ref 135–145)
Total Bilirubin: 1.7 mg/dL — ABNORMAL HIGH (ref 0.3–1.2)
Total Protein: 7.7 g/dL (ref 6.5–8.1)

## 2020-07-07 LAB — URINALYSIS, ROUTINE W REFLEX MICROSCOPIC
Bacteria, UA: NONE SEEN
Bilirubin Urine: NEGATIVE
Glucose, UA: >=500 mg/dL — AB
Ketones, ur: 20 mg/dL — AB
Leukocytes,Ua: NEGATIVE
Nitrite: NEGATIVE
Protein, ur: NEGATIVE mg/dL
Specific Gravity, Urine: 1.02 (ref 1.005–1.030)
pH: 5 (ref 5.0–8.0)

## 2020-07-07 LAB — POTASSIUM: Potassium: 3.9 mmol/L (ref 3.5–5.1)

## 2020-07-07 LAB — RESP PANEL BY RT-PCR (FLU A&B, COVID) ARPGX2
Influenza A by PCR: NEGATIVE
Influenza B by PCR: NEGATIVE
SARS Coronavirus 2 by RT PCR: NEGATIVE

## 2020-07-07 LAB — CBC
HCT: 49.4 % (ref 39.0–52.0)
Hemoglobin: 16.3 g/dL (ref 13.0–17.0)
MCH: 29 pg (ref 26.0–34.0)
MCHC: 33 g/dL (ref 30.0–36.0)
MCV: 87.9 fL (ref 80.0–100.0)
Platelets: 240 10*3/uL (ref 150–400)
RBC: 5.62 MIL/uL (ref 4.22–5.81)
RDW: 13.6 % (ref 11.5–15.5)
WBC: 5.8 10*3/uL (ref 4.0–10.5)
nRBC: 0 % (ref 0.0–0.2)

## 2020-07-07 LAB — GLUCOSE, CAPILLARY
Glucose-Capillary: 118 mg/dL — ABNORMAL HIGH (ref 70–99)
Glucose-Capillary: 127 mg/dL — ABNORMAL HIGH (ref 70–99)
Glucose-Capillary: 132 mg/dL — ABNORMAL HIGH (ref 70–99)

## 2020-07-07 LAB — LIPASE, BLOOD: Lipase: 41 U/L (ref 11–51)

## 2020-07-07 LAB — TROPONIN I (HIGH SENSITIVITY)
Troponin I (High Sensitivity): 48 ng/L — ABNORMAL HIGH (ref <18)
Troponin I (High Sensitivity): 46 ng/L — ABNORMAL HIGH (ref <18)

## 2020-07-07 LAB — SURGICAL PCR SCREEN
MRSA, PCR: NEGATIVE
Staphylococcus aureus: NEGATIVE

## 2020-07-07 LAB — BRAIN NATRIURETIC PEPTIDE: B Natriuretic Peptide: 25 pg/mL (ref 0.0–100.0)

## 2020-07-07 MED ORDER — DIPHENHYDRAMINE HCL 12.5 MG/5ML PO ELIX: 12.5000 mg | ORAL_SOLUTION | Freq: Four times a day (QID) | ORAL | Status: DC | PRN

## 2020-07-07 MED ORDER — POLYVINYL ALCOHOL 1.4 % OP SOLN
1.0000 [drp] | Freq: Two times a day (BID) | OPHTHALMIC | Status: DC
  Administered 2020-07-07 – 2020-07-10 (×5): 1 [drp] via OPHTHALMIC
  Filled 2020-07-07: qty 15

## 2020-07-07 MED ORDER — HEPARIN SODIUM (PORCINE) 5000 UNIT/ML IJ SOLN
5000.0000 [IU] | Freq: Once | INTRAMUSCULAR | Status: AC
  Administered 2020-07-07: 5000 [IU] via SUBCUTANEOUS
  Filled 2020-07-07: qty 1

## 2020-07-07 MED ORDER — ONDANSETRON HCL 4 MG/2ML IJ SOLN: 4.0000 mg | Freq: Four times a day (QID) | INTRAMUSCULAR | Status: DC | PRN

## 2020-07-07 MED ORDER — SODIUM CHLORIDE (PF) 0.9 % IJ SOLN
INTRAMUSCULAR | Status: AC
  Filled 2020-07-07: qty 50

## 2020-07-07 MED ORDER — ATORVASTATIN CALCIUM 10 MG PO TABS
10.0000 mg | ORAL_TABLET | Freq: Every day | ORAL | Status: DC
  Administered 2020-07-07 – 2020-07-09 (×3): 10 mg via ORAL
  Filled 2020-07-07 (×3): qty 1

## 2020-07-07 MED ORDER — MORPHINE SULFATE (PF) 4 MG/ML IV SOLN
4.0000 mg | Freq: Once | INTRAVENOUS | Status: AC
Start: 2020-07-07 — End: 2020-07-07
  Administered 2020-07-07: 4 mg via INTRAVENOUS
  Filled 2020-07-07: qty 1

## 2020-07-07 MED ORDER — METRONIDAZOLE 500 MG/100ML IV SOLN
500.0000 mg | Freq: Three times a day (TID) | INTRAVENOUS | Status: DC
  Administered 2020-07-07 – 2020-07-10 (×9): 500 mg via INTRAVENOUS
  Filled 2020-07-07 (×9): qty 100

## 2020-07-07 MED ORDER — MORPHINE SULFATE (PF) 2 MG/ML IV SOLN: 1.0000 mg | INTRAVENOUS | Status: DC | PRN

## 2020-07-07 MED ORDER — DIPHENHYDRAMINE HCL 50 MG/ML IJ SOLN: 12.5000 mg | Freq: Four times a day (QID) | INTRAMUSCULAR | Status: DC | PRN

## 2020-07-07 MED ORDER — HYDROMORPHONE HCL 1 MG/ML IJ SOLN
1.0000 mg | INTRAMUSCULAR | Status: DC | PRN
  Administered 2020-07-07: 1 mg via INTRAVENOUS
  Filled 2020-07-07: qty 1

## 2020-07-07 MED ORDER — AMLODIPINE BESYLATE 5 MG PO TABS
5.0000 mg | ORAL_TABLET | Freq: Every day | ORAL | Status: DC
  Administered 2020-07-07 – 2020-07-09 (×3): 5 mg via ORAL
  Filled 2020-07-07 (×3): qty 1

## 2020-07-07 MED ORDER — HYDROMORPHONE HCL 1 MG/ML IJ SOLN
1.0000 mg | Freq: Once | INTRAMUSCULAR | Status: AC
  Administered 2020-07-07: 1 mg via INTRAVENOUS
  Filled 2020-07-07: qty 1

## 2020-07-07 MED ORDER — PANTOPRAZOLE SODIUM 40 MG IV SOLR
40.0000 mg | Freq: Every day | INTRAVENOUS | Status: DC
  Administered 2020-07-07 – 2020-07-09 (×3): 40 mg via INTRAVENOUS
  Filled 2020-07-07 (×3): qty 40

## 2020-07-07 MED ORDER — ACETAMINOPHEN 500 MG PO TABS
1000.0000 mg | ORAL_TABLET | Freq: Three times a day (TID) | ORAL | Status: DC
  Administered 2020-07-07 – 2020-07-09 (×5): 1000 mg via ORAL
  Filled 2020-07-07 (×5): qty 2

## 2020-07-07 MED ORDER — ACETAMINOPHEN 325 MG PO TABS: 650.0000 mg | ORAL_TABLET | Freq: Four times a day (QID) | ORAL | Status: DC | PRN

## 2020-07-07 MED ORDER — PROPYLENE GLYCOL 0.6 % OP SOLN: Freq: Two times a day (BID) | OPHTHALMIC | Status: DC

## 2020-07-07 MED ORDER — IOHEXOL 300 MG/ML  SOLN
100.0000 mL | Freq: Once | INTRAMUSCULAR | Status: AC | PRN
  Administered 2020-07-07: 100 mL via INTRAVENOUS

## 2020-07-07 MED ORDER — PIPERACILLIN-TAZOBACTAM 3.375 G IVPB: 3.3750 g | Freq: Three times a day (TID) | INTRAVENOUS | Status: DC

## 2020-07-07 MED ORDER — POTASSIUM CHLORIDE IN NACL 20-0.45 MEQ/L-% IV SOLN
INTRAVENOUS | Status: DC
  Filled 2020-07-07 (×2): qty 1000

## 2020-07-07 MED ORDER — OXYCODONE HCL 5 MG PO TABS
5.0000 mg | ORAL_TABLET | ORAL | Status: DC | PRN
  Administered 2020-07-08 (×2): 10 mg via ORAL
  Filled 2020-07-07 (×2): qty 2

## 2020-07-07 MED ORDER — SODIUM CHLORIDE 0.9 % IV SOLN: INTRAVENOUS | Status: DC

## 2020-07-07 MED ORDER — ENOXAPARIN SODIUM 40 MG/0.4ML IJ SOSY: 40.0000 mg | PREFILLED_SYRINGE | INTRAMUSCULAR | Status: DC

## 2020-07-07 MED ORDER — FLUTICASONE PROPIONATE 50 MCG/ACT NA SUSP
2.0000 | Freq: Every day | NASAL | Status: DC
  Administered 2020-07-08 – 2020-07-10 (×2): 2 via NASAL
  Filled 2020-07-07: qty 16

## 2020-07-07 MED ORDER — MELATONIN 3 MG PO TABS: 3.0000 mg | ORAL_TABLET | Freq: Every evening | ORAL | Status: DC | PRN

## 2020-07-07 MED ORDER — ONDANSETRON HCL 4 MG/2ML IJ SOLN
4.0000 mg | Freq: Once | INTRAMUSCULAR | Status: AC
  Administered 2020-07-07: 4 mg via INTRAVENOUS
  Filled 2020-07-07: qty 2

## 2020-07-07 MED ORDER — INSULIN ASPART 100 UNIT/ML IJ SOLN
0.0000 [IU] | INTRAMUSCULAR | Status: DC
  Administered 2020-07-07: 2 [IU] via SUBCUTANEOUS
  Administered 2020-07-08: 4 [IU] via SUBCUTANEOUS
  Administered 2020-07-08 (×2): 3 [IU] via SUBCUTANEOUS
  Administered 2020-07-09: 7 [IU] via SUBCUTANEOUS
  Administered 2020-07-09 – 2020-07-10 (×2): 3 [IU] via SUBCUTANEOUS

## 2020-07-07 MED ORDER — SIMETHICONE 80 MG PO CHEW: 40.0000 mg | CHEWABLE_TABLET | Freq: Four times a day (QID) | ORAL | Status: DC | PRN

## 2020-07-07 MED ORDER — ACETAMINOPHEN 650 MG RE SUPP: 650.0000 mg | Freq: Four times a day (QID) | RECTAL | Status: DC | PRN

## 2020-07-07 MED ORDER — SODIUM CHLORIDE 0.9 % IV SOLN
2.0000 g | INTRAVENOUS | Status: DC
  Administered 2020-07-07 – 2020-07-09 (×3): 2 g via INTRAVENOUS
  Filled 2020-07-07: qty 2
  Filled 2020-07-07: qty 20
  Filled 2020-07-07 (×2): qty 2

## 2020-07-07 MED ORDER — PIPERACILLIN-TAZOBACTAM 3.375 G IVPB 30 MIN: 3.3750 g | Freq: Three times a day (TID) | INTRAVENOUS | Status: DC

## 2020-07-07 MED ORDER — TAMSULOSIN HCL 0.4 MG PO CAPS
0.4000 mg | ORAL_CAPSULE | Freq: Every day | ORAL | Status: DC
  Administered 2020-07-07 – 2020-07-08 (×2): 0.4 mg via ORAL
  Filled 2020-07-07 (×3): qty 1

## 2020-07-07 MED ORDER — ONDANSETRON HCL 4 MG PO TABS
4.0000 mg | ORAL_TABLET | Freq: Four times a day (QID) | ORAL | Status: DC | PRN
Start: 2020-07-07 — End: 2020-07-10

## 2020-07-07 MED ORDER — KETOROLAC TROMETHAMINE 15 MG/ML IJ SOLN: 15.0000 mg | Freq: Four times a day (QID) | INTRAMUSCULAR | Status: DC | PRN

## 2020-07-07 MED ORDER — ONDANSETRON 4 MG PO TBDP: 4.0000 mg | ORAL_TABLET | Freq: Four times a day (QID) | ORAL | Status: DC | PRN

## 2020-07-07 NOTE — Consult Note (Addendum)
Medical Consultation   Maurice Fox  ACZ:[id number]  DOB: Aug 05, 1950  DOA: 07/07/2020  PCP: Fanny Bien, MD  Outpatient Specialists:   Requesting physician: Dr. Greer Pickerel.  Reason for consultation: Symptomatic cholelithiasis/early acute cholecystitis.   History of Present Illness: Maurice Fox is an 70 y.o. male osteoarthritis, hyperlipidemia, hypertension, nocturia, OSA on CPAP, phimosis, type II DM, appendectomy who stated he was recently diagnosed with cholelithiasis about 2 months ago, but since then has been experiencing increased frequency and intensity of RUQ pain.  The patient stated that he has been having frequent constipation lately.  Yesterday, he describes that he had to strain significantly to have partial BM.  He did not have any major discomfort afterwards and went to bed later in the evening.  Around 0300 the patient woke up with the most intense RUQ pain that he has had so far.  He rated it a 10 out of 10.  It was sharp and radiated to his back.  He was associated with nausea, but denies emesis, diarrhea, melena or hematochezia.  No GU symptoms other than nocturia.  He denies fever, chills, night sweats, but states his appetite has been decreased.  No rhinorrhea, sore throat, dyspnea wheezing or hemoptysis.  Denied typical chest pain, but felt like his abdominal pain was radiated upwards.  No dyspnea, dizziness, diaphoresis, PND, orthopnea or pitting edema of the lower extremities.  Denies polyuria, polydipsia, polyphagia or blurred vision.  He does not check his CBGs regularly at home.  ED course: Initial vital signs were temperature 98 F, pulse 73, respirations 20, BP 97/74 mmHg and O2 sat 99% on room air.  The patient received morphine 4 mg IVP x1, ondansetron 4 mg IVP x1 and then hydromorphone 1 mg IVP x1.  I ordered another milligram of hydromorphone IV and another dose of ondansetron 4 mg IV.  Lab work: CBC was normal with a white count of  5.8, hemoglobin 16.3 g/dL platelets 240.  Lipase was 41 units/L.  Troponin was 48 and then 46 ng/L.  BNP was 25.0 pg/mL.  CMP showed a sodium 6.1 mmol/L but repeat measurement was only 3.9 mmol/L.  Glucose 115 and total bilirubin 1.7 mg/dL.  AST was 60 units/L.  The rest of the CMP measurements are within expected values.  Imaging: A single view portable chest radiograph showed cardiomegaly with shallow inspiration and mild right perihilar/right basilar atelectasis.  No edema or airspace consolidation seen.  CT abdomen/pelvis with contrast showed cholelithiasis without evidence of acute cholecystitis.  There was moderate volume of stool in the colon.  RUQ ultrasound shows chronic cholelithiasis without evidence of acute cholecystitis.  Suspect increased size of hepatic veins from the prior study earlier this year.  Please see images and full radiology report for further detail.  Review of Systems:  ROS As per HPI otherwise 10 point review of systems negative.  Past Medical History: Past Medical History:  Diagnosis Date   Arthritis    Hyperlipidemia    Hypertension    followed by pcp  (11-23-2019  per pt had stress test greater than 20 yrs ago, told ok)   Nocturia    OSA on CPAP    per pt uses nightly   Phimosis    Type 2 diabetes mellitus ([place])    followed by pcp   (11-23-2019 per pt does not check blood sugar at home)   Wears glasses  Past Surgical History: Past Surgical History:  Procedure Laterality Date   APPENDECTOMY  child   CIRCUMCISION N/A 11/29/2019   Procedure: CIRCUMCISION ADULT;  Surgeon: [name], MD;  Location: [facility];  Service: Urology;  Laterality: N/A;   ROTATOR CUFF REPAIR Left chld   Allergies:  No Known Allergies  Social History:  reports that he has been smoking cigars and cigarettes. He has a 49.00 pack-year smoking history. He has never used smokeless tobacco. He reports previous alcohol use. He reports that he does not use  drugs.  Family History: Family History  Problem Relation Age of Onset   Hypotension Mother    Diabetes Mellitus II Father    Prostate cancer Brother    Prostate cancer Brother    Physical Exam: Vitals:   07/07/20 1100 07/07/20 1130 07/07/20 1403 07/07/20 1505  BP: (!) 156/88 (!) 161/88 (!) 156/89 (!) 165/89  Pulse: 89 91 99 (!) 105  Resp: 17 (!) 21 18 (!) 28  Temp:      TempSrc:      SpO2: 99% 97% 100% 100%   Constitutional: Alert and awake, oriented x3, not in any acute distress. Eyes: PERLA, EOMI, irises appear normal, anicteric sclera,  ENMT: external ears and nose appear normal, normal hearing.            Lips appears mildly dry, oropharynx mucosa, tongue, posterior pharynx appear normal  Neck: neck appears normal, no masses, normal ROM, no thyromegaly, no JVD  CVS: S1-S2 clear, no murmur rubs or gallops, no LE edema, normal pedal pulses  Respiratory: Decreased breath sounds in bases, otherwise clear to auscultation bilaterally, no wheezing, rales or rhonchi. Respiratory effort normal. No accessory muscle use.  Abdomen: No distention, obese, positive reducible supraumbilical hernia.  Bowel sounds positive.  Soft, positive RUQ tenderness without guarding or rebound, nondistended, normal bowel sounds, no hepatosplenomegaly, no hernias  Musculoskeletal: : Mild generalized weakness.  No cyanosis, clubbing or edema noted bilaterally Neuro: Cranial nerves II-XII intact, strength, sensation, reflexes Psych: judgement and insight appear normal, stable mood and affect, mental status Skin: no rashes or lesions or ulcers, no induration or nodules   Data reviewed:  I have personally reviewed following labs and imaging studies Labs:  CBC: Recent Labs  Lab 07/07/20 0811  WBC 5.8  HGB 16.3  HCT 49.4  MCV 87.9  PLT 546   Basic Metabolic Panel: Recent Labs  Lab 07/07/20 0811 07/07/20 1016  NA 138  --   K 6.1* 3.9  CL 106  --   CO2 24  --   GLUCOSE 115*  --   BUN 20  --    CREATININE 1.08  --   CALCIUM 9.1  --    GFR CrCl cannot be calculated (Unknown ideal weight.). Liver Function Tests: Recent Labs  Lab 07/07/20 0811  AST 60*  ALT 25  ALKPHOS 95  BILITOT 1.7*  PROT 7.7  ALBUMIN 3.9   Recent Labs  Lab 07/07/20 0811  LIPASE 41   No results for input(s): AMMONIA in the last 168 hours. Coagulation profile No results for input(s): INR, PROTIME in the last 168 hours.  Cardiac Enzymes: No results for input(s): CKTOTAL, CKMB, CKMBINDEX, TROPONINI in the last 168 hours. BNP: Invalid input(s): POCBNP CBG: No results for input(s): GLUCAP in the last 168 hours. D-Dimer No results for input(s): DDIMER in the last 72 hours. Hgb A1c No results for input(s): HGBA1C in the last 72 hours. Lipid Profile No results for  input(s): CHOL, HDL, LDLCALC, TRIG, CHOLHDL, LDLDIRECT in the last 72 hours. Thyroid function studies No results for input(s): TSH, T4TOTAL, T3FREE, THYROIDAB in the last 72 hours.  Invalid input(s): FREET3 Anemia work up No results for input(s): VITAMINB12, FOLATE, FERRITIN, TIBC, IRON, RETICCTPCT in the last 72 hours. Urinalysis    Component Value Date/Time   COLORURINE YELLOW 07/07/2020 0830   APPEARANCEUR CLEAR 07/07/2020 0830   LABSPEC 1.020 07/07/2020 0830   PHURINE 5.0 07/07/2020 0830   GLUCOSEU >=500 (A) 07/07/2020 0830   HGBUR SMALL (A) 07/07/2020 0830   BILIRUBINUR NEGATIVE 07/07/2020 0830   KETONESUR 20 (A) 07/07/2020 0830   PROTEINUR NEGATIVE 07/07/2020 0830   NITRITE NEGATIVE 07/07/2020 0830   LEUKOCYTESUR NEGATIVE 07/07/2020 0830   Sepsis Labs Invalid input(s): PROCALCITONIN,  WBC,  LACTICIDVEN Microbiology Recent Results (from the past 240 hour(s))  Resp Panel by RT-PCR (Flu A&B, Covid) Nasopharyngeal Swab     Status: None   Collection Time: 07/07/20  8:45 AM   Specimen: Nasopharyngeal Swab; Nasopharyngeal(NP) swabs in vial transport medium  Result Value Ref Range Status   SARS Coronavirus 2 by RT PCR  NEGATIVE NEGATIVE Final    Comment: (NOTE) SARS-CoV-2 target nucleic acids are NOT DETECTED.  The SARS-CoV-2 RNA is generally detectable in upper respiratory specimens during the acute phase of infection. The lowest concentration of SARS-CoV-2 viral copies this assay can detect is 138 copies/mL. A negative result does not preclude SARS-Cov-2 infection and should not be used as the sole basis for treatment or other patient management decisions. A negative result may occur with  improper specimen collection/handling, submission of specimen other than nasopharyngeal swab, presence of viral mutation(s) within the areas targeted by this assay, and inadequate number of viral copies(<138 copies/mL). A negative result must be combined with clinical observations, patient history, and epidemiological information. The expected result is Negative.  Fact Sheet for Patients:  EntrepreneurPulse.com.au  Fact Sheet for Healthcare Providers:  IncredibleEmployment.be  This test is no t yet approved or cleared by the [place] FDA and  has been authorized for detection and/or diagnosis of SARS-CoV-2 by FDA under an Emergency Use Authorization (EUA). This EUA will remain  in effect (meaning this test can be used) for the duration of the COVID-19 declaration under Section 564(b)(1) of the Act, 21 U.S.C.section 360bbb-3(b)(1), unless the authorization is terminated  or revoked sooner.       Influenza A by PCR NEGATIVE NEGATIVE Final   Influenza B by PCR NEGATIVE NEGATIVE Final    Comment: (NOTE) The Xpert Xpress SARS-CoV-2/FLU/RSV plus assay is intended as an aid in the diagnosis of influenza from Nasopharyngeal swab specimens and should not be used as a sole basis for treatment. Nasal washings and aspirates are unacceptable for Xpert Xpress SARS-CoV-2/FLU/RSV testing.  Fact Sheet for Patients: EntrepreneurPulse.com.au  Fact Sheet for  Healthcare Providers: IncredibleEmployment.be  This test is not yet approved or cleared by the [place] FDA and has been authorized for detection and/or diagnosis of SARS-CoV-2 by FDA under an Emergency Use Authorization (EUA). This EUA will remain in effect (meaning this test can be used) for the duration of the COVID-19 declaration under Section 564(b)(1) of the Act, 21 U.S.C. section 360bbb-3(b)(1), unless the authorization is terminated or revoked.  Performed at [facility], [place] [address], [place], [place] [address]    Inpatient Medications:   Scheduled Meds:  sodium chloride (PF)       Continuous Infusions:  sodium chloride     piperacillin-tazobactam (  ZOSYN)  IV     Radiological Exams on Admission: CT ABDOMEN PELVIS W CONTRAST  Result Date: 07/07/2020 CLINICAL DATA:  Upper abdominal pain for 1 month. History of gallstones EXAM: CT ABDOMEN AND PELVIS WITH CONTRAST TECHNIQUE: Multidetector CT imaging of the abdomen and pelvis was performed using the standard protocol following bolus administration of intravenous contrast. CONTRAST:  18mL OMNIPAQUE IOHEXOL 300 MG/ML  SOLN COMPARISON:  Ultrasound 07/07/2020, MRI 05/05/2020 FINDINGS: Lower chest: Mild dependent subsegmental atelectasis. Lung bases are otherwise clear. Patulous distal esophagus. Heart size within normal limits. Hepatobiliary: Numerous small primarily subcentimeter low-density lesions scattered throughout both hepatic lobes, possibly small cysts or biliary hamartomas. Multiple stones again seen within the gallbladder lumen. No pericholecystic inflammatory changes by CT. No biliary dilatation. Pancreas: Unremarkable. No pancreatic ductal dilatation or surrounding inflammatory changes. Spleen: Normal in size without focal abnormality. Adrenals/Urinary Tract: Indeterminate density 1.8 cm left adrenal nodule. Unremarkable right adrenal gland. Redemonstrated 2.4 cm hyperdense right  renal cyst, recently classified as a benign hyperdense cyst on MRI. Additional smaller cysts within the right kidney measuring up to 1.8 cm are unchanged. No left-sided renal lesion. No renal stone or hydronephrosis. Urinary bladder is unremarkable. Stomach/Bowel: Stomach is within normal limits. Appendix not definitively seen. Moderate volume of stool within the colon. No evidence of bowel wall thickening, distention, or inflammatory changes. Vascular/Lymphatic: Atherosclerosis throughout the aortoiliac axis without aneurysm. No abdominopelvic lymphadenopathy. Reproductive: Prostate is unremarkable. Other: No free fluid. No abdominopelvic fluid collection. No pneumoperitoneum. Small fat containing supraumbilical hernia. Musculoskeletal: No acute or significant osseous findings. Partially visualized intramuscular lipoma within the left quadratus femoris muscle measuring at least 5.4 x 2.7 cm. Numerous tiny intra-articular calcifications within the right hip joint suggesting synovial chondromatosis. IMPRESSION: 1. No acute abdominopelvic findings. 2. Cholelithiasis without evidence of acute cholecystitis. 3. Moderate volume of stool within the colon. 4. Small fat containing supraumbilical hernia. 5. Indeterminate 1.8 cm left adrenal nodule. 6. Partially visualized intramuscular lipoma within the left quadratus femoris muscle measuring at least 5.4 cm. 7. Numerous tiny intra-articular calcifications within the right hip joint suggesting synovial chondromatosis. Aortic Atherosclerosis (ICD10-I70.0). Electronically Signed   By: Davina Poke D.O.   On: 07/07/2020 12:31   DG Chest Portable 1 View  Result Date: 07/07/2020 CLINICAL DATA:  Chest pain. Additional provided: Patient reports upper abdominal pain intermittently for 1 month since being diagnosed with gallstones. Pain worse this morning. History of diabetes and hypertension. EXAM: PORTABLE CHEST 1 VIEW COMPARISON:  Prior chest CT 04/12/2020. FINDINGS:  Please note portions of the lateral costophrenic angles are excluded from the field of view bilaterally. Shallow inspiration radiograph. Cardiomegaly. Mild right perihilar/right basilar atelectasis. No appreciable airspace consolidation. No evidence of pleural effusion or pneumothorax. No acute bony abnormality identified IMPRESSION: Please note portions of the lateral costophrenic angles are excluded from the field of view bilaterally. Cardiomegaly. Shallow inspiration radiograph with mild right perihilar/right basilar atelectasis. No appreciable airspace consolidation or pulmonary edema. Electronically Signed   By: Kellie Simmering DO   On: 07/07/2020 10:02   US Abdomen Limited RUQ (LIVER/GB)  Result Date: 07/07/2020 CLINICAL DATA:  70 year old male with right upper quadrant abdominal pain this morning. EXAM: ULTRASOUND ABDOMEN LIMITED RIGHT UPPER QUADRANT COMPARISON:  Abdomen MRI 05/05/2020. ultrasound 06/04/2020. FINDINGS: Gallbladder: Gallstone again noted, this measured up to 2.7 cm on the April MRI. Gallbladder wall thickness remains normal at 2 mm. No pericholecystic fluid. No sonographic Murphy sign elicited. Common bile duct: Diameter: 4 mm, normal. Liver: There was no  discrete liver lesion on the April MRI without and with contrast, or the ultrasound last month. Therefore, the circumscribed anechoic appearing areas identified today (such as on image 21) are felt to be prominent hepatic veins. Portal vein is patent on color Doppler imaging with normal direction of blood flow towards the liver. Other: Negative visible right kidney. IMPRESSION: 1. Chronic cholelithiasis without evidence of acute cholecystitis. 2. Suspect increased size of the hepatic veins from the prior studies this year. Query volume overload, CHF. Electronically Signed   By: Genevie Ann M.D.   On: 07/07/2020 09:41    Impression/Recommendations Principal Problem:   Acute cholecystitis/symptomatic cholelithiasis Continue IV  fluids. Analgesics as needed. Antiemetics as needed. Continue ceftriaxone 2 g q 24 hours. Metronidazole 500 mg IVPB every 8 hours. Follow CBC and CMP. Preop/postop care per surgery.  Active Problems:   Hyperlipidemia On atorvastatin 10 mg p.o. daily.    Type 2 diabetes mellitus (HCC) Currently NPO. Hold Synjardy. Check hemoglobin A1c. CBG monitoring every 6 hours.    OSA on CPAP Continue CPAP at bedtime.    Hypertension On amlodipine 10 mg p.o. daily. Monitor blood pressure and heart rate..    Elevated troponin Has known RBBB on EKG. Check echocardiogram in AM.  Thank you for this consultation.  Our [facility] hospitalist team will follow the patient with you.  Time Spent: About 55 minutes were spent during the process of this consultation.  Reubin Milan M.D. [facility] 07/07/2020, 5:05 PM  This document was prepared using Dragon voice recognition software and may contain some unintended transcription errors

## 2020-07-07 NOTE — ED Notes (Signed)
Pt ambulatory to bathroom at this time.

## 2020-07-07 NOTE — ED Notes (Signed)
PA-C at the bedside.  ?

## 2020-07-07 NOTE — H&P (Signed)
CC: upper abdominal pain  Requesting provider: dr zammit  HPI: Maurice Fox is an 70 y.o. male who is here for evaluation for recurrent epigastric and right upper quadrant pain that started around 3 AM this morning.  The pain was most intense he had experienced to date.  It radiated to his back.  It was constant.  He had some nausea without emesis.  No fevers or chills.  He denies any angina or shortness of breath.  He has had similar episodes in the past but not nearly as intense as this episode.  He saw one of the surgeons in our office a few days ago and has already been booked for surgery for July 22.  He denies any jaw pain, left upper extremity pain, dyspnea on exertion, orthopnea.  Comorbidities include diabetes mellitus type 2, hypertension, hyperlipidemia, obstructive sleep apnea on CPAP.  He does smoke.  A pack last 1 day  No TIAs or amaurosis fugax  Although there was no signs of cholecystitis on imaging the patient has remained in fair amount of pain since presenting to the emergency room earlier this morning we were called for consultation.  T bili is elevated at 1.7 with mild elevation of his AST.  He also had mildly elevated troponins.  Per the ER team his EKG was unchanged  Past Medical History:  Diagnosis Date   Arthritis    Hyperlipidemia    Hypertension    followed by pcp  (11-23-2019  per pt had stress test greater than 20 yrs ago, told ok)   Nocturia    OSA on CPAP    per pt uses nightly   Phimosis    Type 2 diabetes mellitus ([place])    followed by pcp   (11-23-2019 per pt does not check blood sugar at home)   Wears glasses     Past Surgical History:  Procedure Laterality Date   APPENDECTOMY  child   CIRCUMCISION N/A 11/29/2019   Procedure: CIRCUMCISION ADULT;  Surgeon: [name], MD;  Location: [facility];  Service: Urology;  Laterality: N/A;   ROTATOR CUFF REPAIR Left chld    No family history on file.  Social:  reports that  he has been smoking cigars and cigarettes. He has a 49.00 pack-year smoking history. He has never used smokeless tobacco. He reports previous alcohol use. He reports that he does not use drugs.  Allergies: No Known Allergies  Medications: I have reviewed the patient's current medications.   ROS - all of the below systems have been reviewed with the patient and positives are indicated with bold text General: chills, fever or night sweats Eyes: blurry vision or double vision ENT: epistaxis or sore throat Allergy/Immunology: itchy/watery eyes or nasal congestion Hematologic/Lymphatic: bleeding problems, blood clots or swollen lymph nodes Endocrine: temperature intolerance or unexpected weight changes Breast: new or changing breast lumps or nipple discharge Resp: cough, shortness of breath, or wheezing CV: chest pain or dyspnea on exertion GI: as per HPI GU: dysuria, trouble voiding, or hematuria MSK: joint pain or joint stiffness Neuro: TIA or stroke symptoms Derm: pruritus and skin lesion changes Psych: anxiety and depression  PE Blood pressure (!) 156/89, pulse 99, temperature 98.4 F (36.9 C), temperature source Oral, resp. rate 18, SpO2 100 %. Constitutional: NAD; conversant; no deformities Eyes: Moist conjunctiva; no lid lag; anicteric; PERRL Neck: Trachea midline; no thyromegaly Lungs: Normal respiratory effort; no tactile fremitus CV: RRR; no palpable thrills; no pitting edema GI:  Abd soft, obese, +supraumbilical hernia - defect about 2 cm, soft, reducible, TTP RUQ; no palpable hepatosplenomegaly MSK: Normal gait; no clubbing/cyanosis Psychiatric: Appropriate affect; alert and oriented x3 Lymphatic: No palpable cervical or axillary lymphadenopathy Skin:no rash/jaundice/lesions  Results for orders placed or performed during the hospital encounter of 07/07/20 (from the past 48 hour(s))  Lipase, blood     Status: None   Collection Time: 07/07/20  8:11 AM  Result Value Ref  Range   Lipase 41 11 - 51 U/L    Comment: Performed at [facility], [place] [address], [place], [place] [address]  Comprehensive metabolic panel     Status: Abnormal   Collection Time: 07/07/20  8:11 AM  Result Value Ref Range   Sodium 138 135 - 145 mmol/L   Potassium 6.1 (H) 3.5 - 5.1 mmol/L   Chloride 106 98 - 111 mmol/L   CO2 24 22 - 32 mmol/L   Glucose, Bld 115 (H) 70 - 99 mg/dL    Comment: Glucose reference range applies only to samples taken after fasting for at least 8 hours.   BUN 20 8 - 23 mg/dL   Creatinine, Ser 1.08 0.61 - 1.24 mg/dL   Calcium 9.1 8.9 - 10.3 mg/dL   Total Protein 7.7 6.5 - 8.1 g/dL   Albumin 3.9 3.5 - 5.0 g/dL   AST 60 (H) 15 - 41 U/L   ALT 25 0 - 44 U/L   Alkaline Phosphatase 95 38 - 126 U/L   Total Bilirubin 1.7 (H) 0.3 - 1.2 mg/dL   GFR, Estimated >60 >60 mL/min    Comment: (NOTE) Calculated using the CKD-EPI Creatinine Equation (2021)    Anion gap 8 5 - 15    Comment: Performed at [facility], [place] [address], [place], [place] [address]  CBC     Status: None   Collection Time: 07/07/20  8:11 AM  Result Value Ref Range   WBC 5.8 4.0 - 10.5 K/uL   RBC 5.62 4.22 - 5.81 MIL/uL   Hemoglobin 16.3 13.0 - 17.0 g/dL   HCT 49.4 39.0 - 52.0 %   MCV 87.9 80.0 - 100.0 fL   MCH 29.0 26.0 - 34.0 pg   MCHC 33.0 30.0 - 36.0 g/dL   RDW 13.6 11.5 - 15.5 %   Platelets 240 150 - 400 K/uL   nRBC 0.0 0.0 - 0.2 %    Comment: Performed at [name], [place] [address] [place], [place] [address]  Troponin I (High Sensitivity)     Status: Abnormal   Collection Time: 07/07/20  8:16 AM  Result Value Ref Range   Troponin I (High Sensitivity) 48 (H) <18 ng/L    Comment: (NOTE) Elevated high sensitivity troponin I (hsTnI) values and significant  changes across serial measurements may suggest ACS but many other  chronic and acute conditions are known to elevate hsTnI results.  Refer to the "Links" section for chest  pain algorithms and additional  guidance. Performed at [facility], [place] [address], [place], [place] [address]   Urinalysis, Routine w reflex microscopic Nasopharyngeal Swab     Status: Abnormal   Collection Time: 07/07/20  8:30 AM  Result Value Ref Range   Color, Urine YELLOW YELLOW   APPearance CLEAR CLEAR   Specific Gravity, Urine 1.020 1.005 - 1.030   pH 5.0 5.0 - 8.0   Glucose, UA >=500 (A) NEGATIVE mg/dL   Hgb urine dipstick SMALL (A) NEGATIVE  Bilirubin Urine NEGATIVE NEGATIVE   Ketones, ur 20 (A) NEGATIVE mg/dL   Protein, ur NEGATIVE NEGATIVE mg/dL   Nitrite NEGATIVE NEGATIVE   Leukocytes,Ua NEGATIVE NEGATIVE   RBC / HPF 0-5 0 - 5 RBC/hpf   WBC, UA 0-5 0 - 5 WBC/hpf   Bacteria, UA NONE SEEN NONE SEEN   Squamous Epithelial / LPF 0-5 0 - 5   Mucus PRESENT     Comment: Performed at [facility], [place] [address], [place], [place] [address]  Resp Panel by RT-PCR (Flu A&B, Covid) Nasopharyngeal Swab     Status: None   Collection Time: 07/07/20  8:45 AM   Specimen: Nasopharyngeal Swab; Nasopharyngeal(NP) swabs in vial transport medium  Result Value Ref Range   SARS Coronavirus 2 by RT PCR NEGATIVE NEGATIVE    Comment: (NOTE) SARS-CoV-2 target nucleic acids are NOT DETECTED.  The SARS-CoV-2 RNA is generally detectable in upper respiratory specimens during the acute phase of infection. The lowest concentration of SARS-CoV-2 viral copies this assay can detect is 138 copies/mL. A negative result does not preclude SARS-Cov-2 infection and should not be used as the sole basis for treatment or other patient management decisions. A negative result may occur with  improper specimen collection/handling, submission of specimen other than nasopharyngeal swab, presence of viral mutation(s) within the areas targeted by this assay, and inadequate number of viral copies(<138 copies/mL). A negative result must be combined with clinical observations,  patient history, and epidemiological information. The expected result is Negative.  Fact Sheet for Patients:  EntrepreneurPulse.com.au  Fact Sheet for Healthcare Providers:  IncredibleEmployment.be  This test is no t yet approved or cleared by the [place] FDA and  has been authorized for detection and/or diagnosis of SARS-CoV-2 by FDA under an Emergency Use Authorization (EUA). This EUA will remain  in effect (meaning this test can be used) for the duration of the COVID-19 declaration under Section 564(b)(1) of the Act, 21 U.S.C.section 360bbb-3(b)(1), unless the authorization is terminated  or revoked sooner.       Influenza A by PCR NEGATIVE NEGATIVE   Influenza B by PCR NEGATIVE NEGATIVE    Comment: (NOTE) The Xpert Xpress SARS-CoV-2/FLU/RSV plus assay is intended as an aid in the diagnosis of influenza from Nasopharyngeal swab specimens and should not be used as a sole basis for treatment. Nasal washings and aspirates are unacceptable for Xpert Xpress SARS-CoV-2/FLU/RSV testing.  Fact Sheet for Patients: EntrepreneurPulse.com.au  Fact Sheet for Healthcare Providers: IncredibleEmployment.be  This test is not yet approved or cleared by the [place] FDA and has been authorized for detection and/or diagnosis of SARS-CoV-2 by FDA under an Emergency Use Authorization (EUA). This EUA will remain in effect (meaning this test can be used) for the duration of the COVID-19 declaration under Section 564(b)(1) of the Act, 21 U.S.C. section 360bbb-3(b)(1), unless the authorization is terminated or revoked.  Performed at [facility], [place] [address], [place], [place] [address]   Brain natriuretic peptide     Status: None   Collection Time: 07/07/20 10:00 AM  Result Value Ref Range   B Natriuretic Peptide 25.0 0.0 - 100.0 pg/mL    Comment: Performed at [facility], [place] [address] [place], [place] [address]  Troponin I (High Sensitivity)     Status: Abnormal   Collection Time: 07/07/20 10:16 AM  Result Value Ref Range   Troponin I (High Sensitivity) 46 (H) <18 ng/L    Comment: (NOTE) Elevated high  sensitivity troponin I (hsTnI) values and significant  changes across serial measurements may suggest ACS but many other  chronic and acute conditions are known to elevate hsTnI results.  Refer to the "Links" section for chest pain algorithms and additional  guidance. Performed at [facility], [place] [address] [place], [place] [address]   Potassium     Status: None   Collection Time: 07/07/20 10:16 AM  Result Value Ref Range   Potassium 3.9 3.5 - 5.1 mmol/L    Comment: DELTA CHECK NOTED Performed at [place] [address], [place], [place] [address]     CT ABDOMEN PELVIS W CONTRAST  Result Date: 07/07/2020 CLINICAL DATA:  Upper abdominal pain for 1 month. History of gallstones EXAM: CT ABDOMEN AND PELVIS WITH CONTRAST TECHNIQUE: Multidetector CT imaging of the abdomen and pelvis was performed using the standard protocol following bolus administration of intravenous contrast. CONTRAST:  177mL OMNIPAQUE IOHEXOL 300 MG/ML  SOLN COMPARISON:  Ultrasound 07/07/2020, MRI 05/05/2020 FINDINGS: Lower chest: Mild dependent subsegmental atelectasis. Lung bases are otherwise clear. Patulous distal esophagus. Heart size within normal limits. Hepatobiliary: Numerous small primarily subcentimeter low-density lesions scattered throughout both hepatic lobes, possibly small cysts or biliary hamartomas. Multiple stones again seen within the gallbladder lumen. No pericholecystic inflammatory changes by CT. No biliary dilatation. Pancreas: Unremarkable. No pancreatic ductal dilatation or surrounding inflammatory changes. Spleen: Normal in size without focal abnormality. Adrenals/Urinary Tract: Indeterminate density 1.8 cm left  adrenal nodule. Unremarkable right adrenal gland. Redemonstrated 2.4 cm hyperdense right renal cyst, recently classified as a benign hyperdense cyst on MRI. Additional smaller cysts within the right kidney measuring up to 1.8 cm are unchanged. No left-sided renal lesion. No renal stone or hydronephrosis. Urinary bladder is unremarkable. Stomach/Bowel: Stomach is within normal limits. Appendix not definitively seen. Moderate volume of stool within the colon. No evidence of bowel wall thickening, distention, or inflammatory changes. Vascular/Lymphatic: Atherosclerosis throughout the aortoiliac axis without aneurysm. No abdominopelvic lymphadenopathy. Reproductive: Prostate is unremarkable. Other: No free fluid. No abdominopelvic fluid collection. No pneumoperitoneum. Small fat containing supraumbilical hernia. Musculoskeletal: No acute or significant osseous findings. Partially visualized intramuscular lipoma within the left quadratus femoris muscle measuring at least 5.4 x 2.7 cm. Numerous tiny intra-articular calcifications within the right hip joint suggesting synovial chondromatosis. IMPRESSION: 1. No acute abdominopelvic findings. 2. Cholelithiasis without evidence of acute cholecystitis. 3. Moderate volume of stool within the colon. 4. Small fat containing supraumbilical hernia. 5. Indeterminate 1.8 cm left adrenal nodule. 6. Partially visualized intramuscular lipoma within the left quadratus femoris muscle measuring at least 5.4 cm. 7. Numerous tiny intra-articular calcifications within the right hip joint suggesting synovial chondromatosis. Aortic Atherosclerosis (ICD10-I70.0). Electronically Signed   By: Davina Poke D.O.   On: 07/07/2020 12:31   DG Chest Portable 1 View  Result Date: 07/07/2020 CLINICAL DATA:  Chest pain. Additional provided: Patient reports upper abdominal pain intermittently for 1 month since being diagnosed with gallstones. Pain worse this morning. History of diabetes and  hypertension. EXAM: PORTABLE CHEST 1 VIEW COMPARISON:  Prior chest CT 04/12/2020. FINDINGS: Please note portions of the lateral costophrenic angles are excluded from the field of view bilaterally. Shallow inspiration radiograph. Cardiomegaly. Mild right perihilar/right basilar atelectasis. No appreciable airspace consolidation. No evidence of pleural effusion or pneumothorax. No acute bony abnormality identified IMPRESSION: Please note portions of the lateral costophrenic angles are excluded from the field of view bilaterally. Cardiomegaly. Shallow inspiration radiograph with mild right perihilar/right basilar atelectasis. No appreciable airspace  consolidation or pulmonary edema. Electronically Signed   By: Kellie Simmering DO   On: 07/07/2020 10:02   US Abdomen Limited RUQ (LIVER/GB)  Result Date: 07/07/2020 CLINICAL DATA:  70 year old male with right upper quadrant abdominal pain this morning. EXAM: ULTRASOUND ABDOMEN LIMITED RIGHT UPPER QUADRANT COMPARISON:  Abdomen MRI 05/05/2020. ultrasound 06/04/2020. FINDINGS: Gallbladder: Gallstone again noted, this measured up to 2.7 cm on the April MRI. Gallbladder wall thickness remains normal at 2 mm. No pericholecystic fluid. No sonographic Murphy sign elicited. Common bile duct: Diameter: 4 mm, normal. Liver: There was no discrete liver lesion on the April MRI without and with contrast, or the ultrasound last month. Therefore, the circumscribed anechoic appearing areas identified today (such as on image 21) are felt to be prominent hepatic veins. Portal vein is patent on color Doppler imaging with normal direction of blood flow towards the liver. Other: Negative visible right kidney. IMPRESSION: 1. Chronic cholelithiasis without evidence of acute cholecystitis. 2. Suspect increased size of the hepatic veins from the prior studies this year. Query volume overload, CHF. Electronically Signed   By: Genevie Ann M.D.   On: 07/07/2020 09:41    Imaging: reviewed  A/P: JANCARLO BIERMANN is an 70 y.o. male with  Symptomatic cholelithiasis, probably early acute cholecystitis given the fact that he is still uncomfortable and having pain Obesity OSA on cpap HPL HTN DM2  He has failed outpatient management for his symptomatic cholelithiasis. Admit for observation IV antibiotic Plan laparoscopic cholecystectomy with possible cholangiogram for Sunday.  We discussed the possibility that it may be Monday depending on surgical acuity. Chemical VTE prophylaxis, SCDs Home medications ER will consult TRH to help manage his medical comorbidities Sliding scale insulin CPAP  I believe the patient's symptoms are consistent with gallbladder disease.  We discussed gallbladder disease. We discussed non-operative and operative management. We discussed the signs & symptoms of acute cholecystitis  I discussed laparoscopic cholecystectomy with IOC in detail.  The patient was shown diagrams detailing the procedure.  We discussed the risks and benefits of a laparoscopic cholecystectomy including, but not limited to bleeding, infection, injury to surrounding structures such as the intestine or liver, bile leak, retained gallstones, need to convert to an open procedure, prolonged diarrhea, blood clots such as  DVT, common bile duct injury, anesthesia risks, and possible need for additional procedures.  We discussed the typical post-operative recovery course. I explained that the likelihood of improvement of their symptoms is good.   Maurice Fox. Redmond Pulling, MD, FACS General, Bariatric, & Minimally Invasive Surgery [facility] Surgery, [place]

## 2020-07-07 NOTE — ED Notes (Signed)
EKG obtained

## 2020-07-07 NOTE — ED Triage Notes (Signed)
Pt complains of upper abdominal pain intermittently x 1 month since being diagnosed with gallstones. Reports pain was worse this morning.

## 2020-07-07 NOTE — ED Notes (Signed)
Report given to Alphonzo Lemmings, RN.

## 2020-07-07 NOTE — ED Notes (Signed)
Pt back from bathroom at this time. Family member at the bedside.

## 2020-07-07 NOTE — ED Provider Notes (Addendum)
Maurice Fox Provider Note   CSN: [id number] Arrival date & time: 07/07/20  9937     History Chief Complaint  Patient presents with   Abdominal Pain    Maurice Fox is a 70 y.o. male with past medical history significant for hyperlipidemia, hypertension, type 2 diabetes, obstructive sleep apnea.  Abdominal surgical history includes appendectomy.  Takes daily aspirin.  HPI Patient presents to emergency room today with chief complaint abdominal pain that has been intermittent x1 month.  The pain became worse this morning.  Pain is located in right upper abdomen and radiates around to his back.  He states the pain has been constant.  He also had chest pain when the pain first started.  He described the chest pain as a soreness.  He rates his abdominal pain 10 of 10 in severity.  He denies any associated shortness of breath.  Patient states he is scheduled to have his gallbladder removed next month.  He also feels like his abdomen is distended which started today.  He has not taken any over-the-counter medication for symptoms prior to arrival.  He endorses associated nausea without emesis.  He denies any fever, chills, gross hematuria, urinary frequency, dysuria.  Past Medical History:  Diagnosis Date   Arthritis    Hyperlipidemia    Hypertension    followed by pcp  (11-23-2019  per pt had stress test greater than 20 yrs ago, told ok)   Nocturia    OSA on CPAP    per pt uses nightly   Phimosis    Type 2 diabetes mellitus ([place])    followed by pcp   (11-23-2019 per pt does not check blood sugar at home)   Wears glasses     There are no problems to display for this patient.   Past Surgical History:  Procedure Laterality Date   APPENDECTOMY  child   CIRCUMCISION N/A 11/29/2019   Procedure: CIRCUMCISION ADULT;  Surgeon: [name], MD;  Location: [facility];  Service: Urology;  Laterality: N/A;   ROTATOR CUFF REPAIR Left chld        No family history on file.  Social History   Tobacco Use   Smoking status: Every Day    Packs/day: 1.00    Years: 49.00    Pack years: 49.00    Types: Cigars, Cigarettes   Smokeless tobacco: Never  Vaping Use   Vaping Use: Never used  Substance Use Topics   Alcohol use: Not Currently   Drug use: Never    Home Medications Prior to Admission medications   Medication Sig Start Date End Date Taking? Authorizing Provider  amLODipine (NORVASC) 5 MG tablet Take 5 mg by mouth daily.    [provider]  atorvastatin (LIPITOR) 10 MG tablet Take 10 mg by mouth daily.    [provider]  benazepril (LOTENSIN) 40 MG tablet Take 40 mg by mouth daily.    [provider]  Empagliflozin-metFORMIN HCl ER 25-1000 MG TB24 Take by mouth daily.    [provider]  fluticasone (FLONASE) 50 MCG/ACT nasal spray Place into both nostrils in the morning and at bedtime.    [provider]  modafinil (PROVIGIL) 200 MG tablet Take 200 mg by mouth daily.    [provider]  Multiple Vitamins-Minerals (CENTRUM SILVER PO) Take by mouth daily.    [provider]  oxyCODONE-acetaminophen (PERCOCET) 5-325 MG tablet Take 1 tablet by mouth every 4 (four) hours  as needed for severe pain. 11/29/19 11/28/20  [name], MD  Propylene Glycol (SYSTANE BALANCE OP) Apply to eye 4 (four) times daily.    [provider]  Sildenafil Citrate (VIAGRA PO) Take by mouth daily as needed.    [provider]  tamsulosin (FLOMAX) 0.4 MG CAPS capsule Take 0.4 mg by mouth daily.    [provider]    Allergies    Patient has no known allergies.  Review of Systems   Review of Systems  Cardiovascular:  Positive for chest pain.  Gastrointestinal:  Positive for abdominal pain, nausea and vomiting.  All other systems reviewed and are negative.  Physical Exam Updated Vital Signs BP (!) 161/88   Pulse 91   Temp 98.4 F (36.9 C)  (Oral)   Resp (!) 21   SpO2 97%   Physical Exam Vitals and nursing note reviewed.  Constitutional:      General: He is not in acute distress.    Appearance: He is not ill-appearing.  HENT:     Head: Normocephalic and atraumatic.     Right Ear: Tympanic membrane and external ear normal.     Left Ear: Tympanic membrane and external ear normal.     Nose: Nose normal.     Mouth/Throat:     Mouth: Mucous membranes are moist.     Pharynx: Oropharynx is clear.  Eyes:     General: No scleral icterus.       Right eye: No discharge.        Left eye: No discharge.     Extraocular Movements: Extraocular movements intact.     Conjunctiva/sclera: Conjunctivae normal.     Pupils: Pupils are equal, round, and reactive to light.  Neck:     Vascular: No JVD.  Cardiovascular:     Rate and Rhythm: Normal rate and regular rhythm.     Pulses: Normal pulses.          Radial pulses are 2+ on the right side and 2+ on the left side.     Heart sounds: Normal heart sounds.  Pulmonary:     Comments: Lungs clear to auscultation in all fields. Symmetric chest rise. No wheezing, rales, or rhonchi. Abdominal:     Comments: Abdomen is soft, minimally distended.  Tenderness to palpation of right upper quadrant and epigastric regions.  No peritoneal signs.  No CVA tenderness.  Musculoskeletal:        General: Normal range of motion.     Cervical back: Normal range of motion.  Skin:    General: Skin is warm and dry.     Capillary Refill: Capillary refill takes less than 2 seconds.  Neurological:     Mental Status: He is oriented to person, place, and time.     GCS: GCS eye subscore is 4. GCS verbal subscore is 5. GCS motor subscore is 6.     Comments: Fluent speech, no facial droop.  Psychiatric:        Behavior: Behavior normal.    ED Results / Procedures / Treatments   Labs (all labs ordered are listed, but only abnormal results are displayed) Labs Reviewed  COMPREHENSIVE METABOLIC PANEL -  Abnormal; Notable for the following components:      Result Value   Potassium 6.1 (*)    Glucose, Bld 115 (*)    AST 60 (*)    Total Bilirubin 1.7 (*)    All other components within normal limits  URINALYSIS, ROUTINE W  REFLEX MICROSCOPIC - Abnormal; Notable for the following components:   Glucose, UA >=500 (*)    Hgb urine dipstick SMALL (*)    Ketones, ur 20 (*)    All other components within normal limits  TROPONIN I (HIGH SENSITIVITY) - Abnormal; Notable for the following components:   Troponin I (High Sensitivity) 48 (*)    All other components within normal limits  TROPONIN I (HIGH SENSITIVITY) - Abnormal; Notable for the following components:   Troponin I (High Sensitivity) 46 (*)    All other components within normal limits  RESP PANEL BY RT-PCR (FLU A&B, COVID) ARPGX2  LIPASE, BLOOD  CBC  BRAIN NATRIURETIC PEPTIDE  POTASSIUM    EKG EKG Interpretation  Date/Time:  Saturday July 07 2020 08:10:35 EDT Ventricular Rate:  89 PR Interval:  211 QRS Duration: 142 QT Interval:  403 QTC Calculation: 491 R Axis:   -81 Text Interpretation: Sinus rhythm Consider left atrial enlargement Right bundle branch block Inferior infarct, old Lateral leads are also involved Confirmed by Milton Ferguson [phone]) on 07/07/2020 11:04:35 AM  Radiology CT ABDOMEN PELVIS W CONTRAST  Result Date: 07/07/2020 CLINICAL DATA:  Upper abdominal pain for 1 month. History of gallstones EXAM: CT ABDOMEN AND PELVIS WITH CONTRAST TECHNIQUE: Multidetector CT imaging of the abdomen and pelvis was performed using the standard protocol following bolus administration of intravenous contrast. CONTRAST:  162mL OMNIPAQUE IOHEXOL 300 MG/ML  SOLN COMPARISON:  Ultrasound 07/07/2020, MRI 05/05/2020 FINDINGS: Lower chest: Mild dependent subsegmental atelectasis. Lung bases are otherwise clear. Patulous distal esophagus. Heart size within normal limits. Hepatobiliary: Numerous small primarily subcentimeter low-density lesions  scattered throughout both hepatic lobes, possibly small cysts or biliary hamartomas. Multiple stones again seen within the gallbladder lumen. No pericholecystic inflammatory changes by CT. No biliary dilatation. Pancreas: Unremarkable. No pancreatic ductal dilatation or surrounding inflammatory changes. Spleen: Normal in size without focal abnormality. Adrenals/Urinary Tract: Indeterminate density 1.8 cm left adrenal nodule. Unremarkable right adrenal gland. Redemonstrated 2.4 cm hyperdense right renal cyst, recently classified as a benign hyperdense cyst on MRI. Additional smaller cysts within the right kidney measuring up to 1.8 cm are unchanged. No left-sided renal lesion. No renal stone or hydronephrosis. Urinary bladder is unremarkable. Stomach/Bowel: Stomach is within normal limits. Appendix not definitively seen. Moderate volume of stool within the colon. No evidence of bowel wall thickening, distention, or inflammatory changes. Vascular/Lymphatic: Atherosclerosis throughout the aortoiliac axis without aneurysm. No abdominopelvic lymphadenopathy. Reproductive: Prostate is unremarkable. Other: No free fluid. No abdominopelvic fluid collection. No pneumoperitoneum. Small fat containing supraumbilical hernia. Musculoskeletal: No acute or significant osseous findings. Partially visualized intramuscular lipoma within the left quadratus femoris muscle measuring at least 5.4 x 2.7 cm. Numerous tiny intra-articular calcifications within the right hip joint suggesting synovial chondromatosis. IMPRESSION: 1. No acute abdominopelvic findings. 2. Cholelithiasis without evidence of acute cholecystitis. 3. Moderate volume of stool within the colon. 4. Small fat containing supraumbilical hernia. 5. Indeterminate 1.8 cm left adrenal nodule. 6. Partially visualized intramuscular lipoma within the left quadratus femoris muscle measuring at least 5.4 cm. 7. Numerous tiny intra-articular calcifications within the right hip joint  suggesting synovial chondromatosis. Aortic Atherosclerosis (ICD10-I70.0). Electronically Signed   By: Davina Poke D.O.   On: 07/07/2020 12:31   DG Chest Portable 1 View  Result Date: 07/07/2020 CLINICAL DATA:  Chest pain. Additional provided: Patient reports upper abdominal pain intermittently for 1 month since being diagnosed with gallstones. Pain worse this morning. History of diabetes and hypertension. EXAM: PORTABLE CHEST 1 VIEW COMPARISON:  Prior chest CT 04/12/2020. FINDINGS: Please note portions of the lateral costophrenic angles are excluded from the field of view bilaterally. Shallow inspiration radiograph. Cardiomegaly. Mild right perihilar/right basilar atelectasis. No appreciable airspace consolidation. No evidence of pleural effusion or pneumothorax. No acute bony abnormality identified IMPRESSION: Please note portions of the lateral costophrenic angles are excluded from the field of view bilaterally. Cardiomegaly. Shallow inspiration radiograph with mild right perihilar/right basilar atelectasis. No appreciable airspace consolidation or pulmonary edema. Electronically Signed   By: Kellie Simmering DO   On: 07/07/2020 10:02   US Abdomen Limited RUQ (LIVER/GB)  Result Date: 07/07/2020 CLINICAL DATA:  70 year old male with right upper quadrant abdominal pain this morning. EXAM: ULTRASOUND ABDOMEN LIMITED RIGHT UPPER QUADRANT COMPARISON:  Abdomen MRI 05/05/2020. ultrasound 06/04/2020. FINDINGS: Gallbladder: Gallstone again noted, this measured up to 2.7 cm on the April MRI. Gallbladder wall thickness remains normal at 2 mm. No pericholecystic fluid. No sonographic Murphy sign elicited. Common bile duct: Diameter: 4 mm, normal. Liver: There was no discrete liver lesion on the April MRI without and with contrast, or the ultrasound last month. Therefore, the circumscribed anechoic appearing areas identified today (such as on image 21) are felt to be prominent hepatic veins. Portal vein is patent on  color Doppler imaging with normal direction of blood flow towards the liver. Other: Negative visible right kidney. IMPRESSION: 1. Chronic cholelithiasis without evidence of acute cholecystitis. 2. Suspect increased size of the hepatic veins from the prior studies this year. Query volume overload, CHF. Electronically Signed   By: Genevie Ann M.D.   On: 07/07/2020 09:41    Procedures Procedures   Medications Ordered in ED Medications  sodium chloride (PF) 0.9 % injection (has no administration in time range)  morphine 4 MG/ML injection 4 mg (4 mg Intravenous Given 07/07/20 0839)  ondansetron (ZOFRAN) injection 4 mg (4 mg Intravenous Given 07/07/20 0838)  HYDROmorphone (DILAUDID) injection 1 mg (1 mg Intravenous Given 07/07/20 1050)  iohexol (OMNIPAQUE) 300 MG/ML solution 100 mL (100 mLs Intravenous Contrast Given 07/07/20 1134)    ED Course  I have reviewed the triage vital signs and the nursing notes.  Pertinent labs & imaging results that were available during my care of the patient were reviewed by me and considered in my medical decision making (see chart for details).    MDM Rules/Calculators/A&P                         History provided by patient with additional history obtained from chart review.    Patient presenting with right upper quadrant abdominal pain and chest pain.  He has history of cholelithiasis and is scheduled to have gallbladder removed in 1 month.  Pain acutely worsened today.  On ED arrival he was afebrile, hemodynamically stable.  Patient has tenderness to palpation of right upper quadrant and epigastric regions.  No peritoneal signs.  Abdomen is mildly distended.  CBC unremarkable.  CMP shows hyperkalemia with potassium of 6.1, no renal insufficiency, AST slightly elevated at 60 and a total bili of 1.7.  Potassium was rechecked and is normal at 3.9.  Lipase is within normal range.  Troponin is 48, repeat was 46.  EKG shows no ischemic changes.  Chest x-ray shows cardiomegaly  with shallow inspiration radiograph with mild right perihilar/right basilar atelectasis.  I viewed image and agree with radiologist impression.  Right upper quadrant abdominal ultrasound shows chronic cholelithiasis without evidence of acute cholecystitis.  Radiologist also commented  on increased size of hepatic veins compared to prior studies.  Patient does not appear volume overload on chest x-ray.  BNP was added to work-up and is normal at 25.  He has no history of CHF diagnosis.  COVID and flu test are negative.  Patient has been given morphine which did not help his pain so he was given a dose of Dilaudid.  Patient also given Zofran for nausea.  UA without signs of infection. CT abdomen pelvis shows no acute findings.  This is patient and he still having significant pain. Consulted on-call general surgeon given ongoing pain and will case discussed with Dr. Redmond Pulling who is recommending surgical admission.  He will see the patient.  Patient updated on plan of care and is appreciative.  Discussed HPI, physical exam and plan of care for this patient with attending Dr. Roderic Palau. The attending physician evaluated this patient as part of a shared visit and agrees with plan of care.  Dr. Redmond Pulling has evaluated the patient is asking for medical consult for management of his chronic conditions including hypertension, diabetes and hyperlipidemia.  Consulted hospitalist Dr. Olevia Bowens who agrees to follow in consult.  Portions of this note were generated with [employer]. Dictation errors may occur despite best attempts at proofreading.    Final Clinical Impression(s) / ED Diagnoses Final diagnoses:  RUQ abdominal pain  Biliary calculus of other site without obstruction    Rx / DC Orders ED Discharge Orders     None        Barrie Folk, PA-C 07/07/20 1336    Barrie Folk, PA-C 07/07/20 1552    Milton Ferguson, MD 07/09/20 1005

## 2020-07-07 NOTE — ED Notes (Signed)
Report called to Alphonzo Lemmings, RN.

## 2020-07-08 ENCOUNTER — Observation Stay (HOSPITAL_BASED_OUTPATIENT_CLINIC_OR_DEPARTMENT_OTHER): Payer: Medicare HMO

## 2020-07-08 DIAGNOSIS — I10 Essential (primary) hypertension: Secondary | ICD-10-CM

## 2020-07-08 DIAGNOSIS — G4733 Obstructive sleep apnea (adult) (pediatric): Secondary | ICD-10-CM | POA: Diagnosis not present

## 2020-07-08 DIAGNOSIS — K810 Acute cholecystitis: Secondary | ICD-10-CM

## 2020-07-08 DIAGNOSIS — E1165 Type 2 diabetes mellitus with hyperglycemia: Secondary | ICD-10-CM | POA: Diagnosis not present

## 2020-07-08 DIAGNOSIS — R778 Other specified abnormalities of plasma proteins: Secondary | ICD-10-CM

## 2020-07-08 DIAGNOSIS — Z9989 Dependence on other enabling machines and devices: Secondary | ICD-10-CM | POA: Diagnosis not present

## 2020-07-08 DIAGNOSIS — C8339 Diffuse large B-cell lymphoma, extranodal and solid organ sites: Secondary | ICD-10-CM | POA: Diagnosis not present

## 2020-07-08 DIAGNOSIS — K812 Acute cholecystitis with chronic cholecystitis: Secondary | ICD-10-CM | POA: Diagnosis not present

## 2020-07-08 DIAGNOSIS — R9431 Abnormal electrocardiogram [ECG] [EKG]: Secondary | ICD-10-CM

## 2020-07-08 DIAGNOSIS — E785 Hyperlipidemia, unspecified: Secondary | ICD-10-CM | POA: Diagnosis not present

## 2020-07-08 DIAGNOSIS — K8010 Calculus of gallbladder with chronic cholecystitis without obstruction: Secondary | ICD-10-CM | POA: Diagnosis not present

## 2020-07-08 LAB — CBC WITH DIFFERENTIAL/PLATELET
Abs Immature Granulocytes: 0.02 10*3/uL (ref 0.00–0.07)
Basophils Absolute: 0 10*3/uL (ref 0.0–0.1)
Basophils Relative: 1 %
Eosinophils Absolute: 0.1 10*3/uL (ref 0.0–0.5)
Eosinophils Relative: 1 %
HCT: 48.8 % (ref 39.0–52.0)
Hemoglobin: 15.8 g/dL (ref 13.0–17.0)
Immature Granulocytes: 0 %
Lymphocytes Relative: 18 %
Lymphs Abs: 1.2 10*3/uL (ref 0.7–4.0)
MCH: 28.5 pg (ref 26.0–34.0)
MCHC: 32.4 g/dL (ref 30.0–36.0)
MCV: 88.1 fL (ref 80.0–100.0)
Monocytes Absolute: 0.9 10*3/uL (ref 0.1–1.0)
Monocytes Relative: 14 %
Neutro Abs: 4.3 10*3/uL (ref 1.7–7.7)
Neutrophils Relative %: 66 %
Platelets: 242 10*3/uL (ref 150–400)
RBC: 5.54 MIL/uL (ref 4.22–5.81)
RDW: 13.4 % (ref 11.5–15.5)
WBC: 6.5 10*3/uL (ref 4.0–10.5)
nRBC: 0 % (ref 0.0–0.2)

## 2020-07-08 LAB — COMPREHENSIVE METABOLIC PANEL
ALT: 24 U/L (ref 0–44)
AST: 39 U/L (ref 15–41)
Albumin: 3.8 g/dL (ref 3.5–5.0)
Alkaline Phosphatase: 98 U/L (ref 38–126)
Anion gap: 8 (ref 5–15)
BUN: 17 mg/dL (ref 8–23)
CO2: 26 mmol/L (ref 22–32)
Calcium: 9 mg/dL (ref 8.9–10.3)
Chloride: 104 mmol/L (ref 98–111)
Creatinine, Ser: 0.87 mg/dL (ref 0.61–1.24)
GFR, Estimated: >60 mL/min (ref >60)
Glucose, Bld: 77 mg/dL (ref 70–99)
Potassium: 3.5 mmol/L (ref 3.5–5.1)
Sodium: 138 mmol/L (ref 135–145)
Total Bilirubin: 0.7 mg/dL (ref 0.3–1.2)
Total Protein: 7.7 g/dL (ref 6.5–8.1)

## 2020-07-08 LAB — ECHOCARDIOGRAM COMPLETE
Area-P 1/2: 5.54 cm2
Calc EF: 51.4 %
S' Lateral: 3.7 cm
Single Plane A2C EF: 52.1 %
Single Plane A4C EF: 50.7 %

## 2020-07-08 LAB — GLUCOSE, CAPILLARY
Glucose-Capillary: 111 mg/dL — ABNORMAL HIGH (ref 70–99)
Glucose-Capillary: 126 mg/dL — ABNORMAL HIGH (ref 70–99)
Glucose-Capillary: 146 mg/dL — ABNORMAL HIGH (ref 70–99)
Glucose-Capillary: 162 mg/dL — ABNORMAL HIGH (ref 70–99)
Glucose-Capillary: 71 mg/dL (ref 70–99)
Glucose-Capillary: 79 mg/dL (ref 70–99)

## 2020-07-08 LAB — MAGNESIUM: Magnesium: 2 mg/dL (ref 1.7–2.4)

## 2020-07-08 LAB — PHOSPHORUS: Phosphorus: 3.9 mg/dL (ref 2.5–4.6)

## 2020-07-08 MED ORDER — SUGAMMADEX SODIUM 500 MG/5ML IV SOLN
INTRAVENOUS | Status: AC
  Filled 2020-07-08: qty 5

## 2020-07-08 MED ORDER — FENTANYL CITRATE (PF) 250 MCG/5ML IJ SOLN
INTRAMUSCULAR | Status: AC
  Filled 2020-07-08: qty 5

## 2020-07-08 MED ORDER — DEXAMETHASONE SODIUM PHOSPHATE 10 MG/ML IJ SOLN
INTRAMUSCULAR | Status: AC
  Filled 2020-07-08: qty 1

## 2020-07-08 MED ORDER — ROCURONIUM BROMIDE 10 MG/ML (PF) SYRINGE
PREFILLED_SYRINGE | INTRAVENOUS | Status: AC
  Filled 2020-07-08: qty 10

## 2020-07-08 MED ORDER — PHENYLEPHRINE HCL (PRESSORS) 10 MG/ML IV SOLN
INTRAVENOUS | Status: AC
  Filled 2020-07-08: qty 1

## 2020-07-08 MED ORDER — ONDANSETRON HCL 4 MG/2ML IJ SOLN
INTRAMUSCULAR | Status: AC
  Filled 2020-07-08: qty 2

## 2020-07-08 MED ORDER — PROPOFOL 10 MG/ML IV BOLUS
INTRAVENOUS | Status: AC
  Filled 2020-07-08: qty 20

## 2020-07-08 MED ORDER — ENOXAPARIN SODIUM 40 MG/0.4ML IJ SOSY
40.0000 mg | PREFILLED_SYRINGE | INTRAMUSCULAR | Status: DC
  Administered 2020-07-08 – 2020-07-10 (×2): 40 mg via SUBCUTANEOUS
  Filled 2020-07-08 (×2): qty 0.4

## 2020-07-08 MED ORDER — LIDOCAINE 2% (20 MG/ML) 5 ML SYRINGE
INTRAMUSCULAR | Status: AC
  Filled 2020-07-08: qty 5

## 2020-07-08 NOTE — Progress Notes (Signed)
Was talking with patient about what time he would like for me to come and assist with putting his CPAP machine on patient statement "I am 70 years old, I am not a child, I do not need your help, I can put it on myself." I responded "ok thank you sir"

## 2020-07-08 NOTE — Progress Notes (Signed)
PROGRESS NOTE    Maurice Fox  TKP:[id number] DOB: [birth year] DOA: 07/07/2020 PCP: Fanny Bien, MD  Brief Narrative:  The patient is an obese 70 year old African-American male with a past medical history significant for but not limited to osteoarthritis, hyperlipidemia, hypertension, nocturia, OSA on CPAP, phimosis, type 2 diabetes mellitus, history of appendectomy as well as other comorbidities who states that he was recently diagnosed with cholelithiasis about 2 months ago but then since then has been experiencing increased frequency and intensity of right upper quadrant pain.  Patient states that he been having frequent constipation lately and yesterday he states that he had to strain the have a partial bowel movement.  He did not have any major discomfort afterwards and went to bed later in the evening.  Around 3 AM he woke up with the most intense right upper quadrant pain is overhead so far and he rated a 10 out of 10 with sharp and that radiated to his back.  It is associate with nausea but denies any emesis, diarrhea, melena or hematochezia.  He had no other GU symptoms other than nocturia.  Because of his symptoms he presented to the ED and received IV morphine and ondansetron as well as an IV hydromorphone.  Patient underwent further imaging and had a x-ray which showed shallow ulceration and mild right perihilar/basilar atelectasis.  CT of the abdomen pelvis was done with contrast and showed cholelithiasis without evidence of acute cholecystitis.  There is moderate volume of stool in the colon.  Right upper quadrant ultrasound shows chronic cholelithiasis without evidence of acute cholecystitis.  Patient was admitted by general surgery and TRH was consulted given his elevated troponins and medical management.  Currently he is being admitted and treated for acute cholecystitis with symptomatic cholelithiasis and he has been placed on IV fluids and analgesics.  He started on IV antibiotics  and surgery is planning to take the patient for surgical intervention in the morning.   Assessment & Plan:   Principal Problem:   Acute cholecystitis Active Problems:   Hyperlipidemia   Type 2 diabetes mellitus (HCC)   OSA on CPAP   Hypertension   Elevated troponin  Acute cholecystitis/symptomatic cholelithiasis -Continue IV fluids but reduce rate to 75 mL/hr -Analgesics as needed with Ketorolac, Morphine and Acetaminophen -Antiemetics as needed with po/IV Ondansetron -Continue ceftriaxone 2 g q 24 hours and Metronidazole 500 mg IVPB every 8 hours. -Follow CBC and CMP. -Preop/postop care per surgery.   GERD -C/w Pantoprazole 40 mg IV   BPH -C/w Tamsulosin 0.4 mg sq qHS  Hyperlipidemia -On atorvastatin 10 mg p.o. daily.   Type 2 diabetes mellitus (HCC) -Was NPO But because Surgery is going to be tomorrow will be placed on a Diet. -Holding Synjardy. -Check hemoglobin A1c in the AM. -CBG monitoring every 6 hours; CBGs ranging from 71-162 -Continuing Resistant SSI q4h and will need to change to AC/HS   OSA on CPAP -Continue CPAP at bedtime.   Hypertension -On amlodipine 10 mg p.o. daily. -Continue to Monitor BP per Protocol and heart rate. -Last BP was 160/86 -If necessary will place on PRN Hydralazine   Elevated Troponin -Has known RBBB on EKG. -Troponin was Flat and went from 48 -> 46; Not indicative of ACS and likely demand ischemia  -Checked Echocardiogram and showed EF of 50-55% with No Regional Wall motion Abnormalities but did have Grade 1 DD -Currently Asymptomatic and has no Chest Pain   Obesity -Complicates overall prognosis and care -Estimated body  mass index is 34.72 kg/m as calculated from the following:   Height as of 11/29/19: 5\' 10"  (1.778 m).   Weight as of 11/29/19: 109.8 kg. -Weight Loss and Dietary Counseling given    DVT prophylaxis: Enoxaparin 40 mg sq q24h Code Status: FULL CODE Family Communication: No family present at bedside   Disposition Plan: Pending Surgery and Further Surgical Clearance   Status is: Observation  The patient will require care spanning > 2 midnights and should be moved to inpatient because: Unsafe d/c plan, IV treatments appropriate due to intensity of illness or inability to take PO, and Inpatient level of care appropriate due to severity of illness  Dispo: The patient is from: Home              Anticipated d/c is to: Home              Patient currently is not medically stable to d/c.   Difficult to place patient No   Consultants:  [facility] is Primary   Procedures:  ECHOCARDIOGRAM IMPRESSIONS     1. Left ventricular ejection fraction, by estimation, is 50 to 55%. The  left ventricle has low normal function. The left ventricle has no regional  wall motion abnormalities. There is moderate asymmetric left ventricular  hypertrophy of the basal-septal  segment. Left ventricular diastolic parameters are consistent with Grade I  diastolic dysfunction (impaired relaxation). Elevated left ventricular  end-diastolic pressure.   2. Right ventricular systolic function is normal. The right ventricular  size is normal.   3. The mitral valve is grossly normal. Trivial mitral valve  regurgitation.   4. The aortic valve is tricuspid. Aortic valve regurgitation is not  visualized.   5. Aortic dilatation noted. There is borderline dilatation of the aortic  root, measuring 38 mm.   6. The inferior vena cava is normal in size with greater than 50%  respiratory variability, suggesting right atrial pressure of 3 mmHg.   Comparison(s): No prior Echocardiogram.   FINDINGS   Left Ventricle: Left ventricular ejection fraction, by estimation, is 50  to 55%. The left ventricle has low normal function. The left ventricle has  no regional wall motion abnormalities. The left ventricular internal  cavity size was normal in size.  There is moderate asymmetric left ventricular hypertrophy of the   basal-septal segment. Left ventricular diastolic parameters are consistent  with Grade I diastolic dysfunction (impaired relaxation). Elevated left  ventricular end-diastolic pressure.   Right Ventricle: The right ventricular size is normal. No increase in  right ventricular wall thickness. Right ventricular systolic function is  normal.   Left Atrium: Left atrial size was normal in size.   Right Atrium: Right atrial size was normal in size.   Pericardium: There is no evidence of pericardial effusion.   Mitral Valve: The mitral valve is grossly normal. Trivial mitral valve  regurgitation.   Tricuspid Valve: The tricuspid valve is grossly normal. Tricuspid valve  regurgitation is trivial.   Aortic Valve: The aortic valve is tricuspid. Aortic valve regurgitation is  not visualized.   Pulmonic Valve: The pulmonic valve was grossly normal. Pulmonic valve  regurgitation is not visualized.   Aorta: Aortic dilatation noted. There is borderline dilatation of the  aortic root, measuring 38 mm.   Venous: The inferior vena cava is normal in size with greater than 50%  respiratory variability, suggesting right atrial pressure of 3 mmHg.   IAS/Shunts: No atrial level shunt detected by color flow Doppler.  LEFT VENTRICLE  PLAX 2D  LVIDd:         4.70 cm      Diastology  LVIDs:         3.70 cm      LV e' medial:    4.87 cm/s  LV PW:         1.20 cm      LV E/e' medial:  16.3  LV IVS:        1.80 cm      LV e' lateral:   4.56 cm/s  LVOT diam:     2.40 cm      LV E/e' lateral: 17.4  LV SV:         79  LV SV Index:   35  LVOT Area:     4.52 cm     LV Volumes (MOD)  LV vol d, MOD A2C: 153.0 ml  LV vol d, MOD A4C: 201.0 ml  LV vol s, MOD A2C: 73.3 ml  LV vol s, MOD A4C: 99.1 ml  LV SV MOD A2C:     79.7 ml  LV SV MOD A4C:     201.0 ml  LV SV MOD BP:      90.3 ml   RIGHT VENTRICLE  RV S prime:     13.30 cm/s  TAPSE (M-mode): 1.7 cm   LEFT ATRIUM             Index  LA  diam:        3.90 cm 1.72 cm/m  LA Vol (A2C):   39.7 ml 17.54 ml/m  LA Vol (A4C):   29.5 ml 13.03 ml/m  LA Biplane Vol: 34.8 ml 15.37 ml/m   AORTIC VALVE  LVOT Vmax:   93.55 cm/s  LVOT Vmean:  61.650 cm/s  LVOT VTI:    0.174 m     AORTA  Ao Root diam: 3.90 cm  Ao Asc diam:  3.70 cm   MITRAL VALVE  MV Area (PHT): 5.54 cm     SHUNTS  MV Decel Time: 137 msec     Systemic VTI:  0.17 m  MV E velocity: 79.40 cm/s   Systemic Diam: 2.40 cm  MV A velocity: 114.00 cm/s  MV E/A ratio:  0.70   Antimicrobials:  Anti-infectives (From admission, onward)    Start     Dose/Rate Route Frequency Ordered Stop   07/07/20 1800  cefTRIAXone (ROCEPHIN) 2 g in sodium chloride 0.9 % 100 mL IVPB        2 g 200 mL/hr over 30 Minutes Intravenous Every 24 hours 07/07/20 1721     07/07/20 1800  metroNIDAZOLE (FLAGYL) IVPB 500 mg        500 mg 100 mL/hr over 60 Minutes Intravenous Every 8 hours 07/07/20 1732     07/07/20 1700  piperacillin-tazobactam (ZOSYN) IVPB 3.375 g  Status:  Discontinued        3.375 g 12.5 mL/hr over 240 Minutes Intravenous Every 8 hours 07/07/20 1612 07/07/20 1732   07/07/20 1615  piperacillin-tazobactam (ZOSYN) IVPB 3.375 g  Status:  Discontinued        3.375 g 100 mL/hr over 30 Minutes Intravenous Every 8 hours 07/07/20 1606 07/07/20 1611        Subjective: Seen and examined at bedside and is still complaining some right upper quadrant pain.  No nausea or vomiting but had some earlier.  Denies any lightheadedness or dizziness.  No shortness breath or chest pain but states  that he had pain radiating to chest from his abdomen prior to admission.  No other concerns or complaints at this time.  Objective: Vitals:   07/07/20 2057 07/08/20 0146 07/08/20 0540 07/08/20 1334  BP: (!) 166/89 135/72 (!) 147/88 (!) 160/86  Pulse: 92 87 93 89  Resp: 20 18 18 18   Temp: 99.8 F (37.7 C) 98.7 F (37.1 C) 98.4 F (36.9 C) (!) 97.5 F (36.4 C)  TempSrc: Oral Oral Oral Oral  SpO2:  96% 100% 99% 99%    Intake/Output Summary (Last 24 hours) at 07/08/2020 1735 Last data filed at 07/08/2020 1400 Gross per 24 hour  Intake 3614.68 ml  Output 0 ml  Net 3614.68 ml   There were no vitals filed for this visit.  Examination: Physical Exam:  Constitutional: WN/WD obese AAM in NAD and appears calm and comfortable Eyes: Lids and conjunctivae normal, sclerae anicteric  ENMT: External Ears, Nose appear normal. Grossly normal hearing.  Neck: Appears normal, supple, no cervical masses, normal ROM, no appreciable thyromegaly; no JVD Respiratory: Diminished to auscultation bilaterally, no wheezing, rales, rhonchi or crackles. Normal respiratory effort and patient is not tachypenic. No accessory muscle use. Unlabored breathing  Cardiovascular: RRR, no murmurs / rubs / gallops. S1 and S2 auscultated. Trace LE edema Abdomen: Soft, Tender in the RUQ, Distended 2/2 body habitus. Bowel sounds positive.  GU: Deferred. Musculoskeletal: No clubbing / cyanosis of digits/nails. No joint deformity upper and lower extremities. Skin: No rashes, lesions, ulcers on a limited skin evaluation. No induration; Warm and dry.  Neurologic: CN 2-12 grossly intact with no focal deficits. Romberg sign and cerebellar reflexes not assessed.  Psychiatric: Normal judgment and insight. Alert and oriented x 3. Normal mood and appropriate affect.   Data Reviewed: I have personally reviewed following labs and imaging studies  CBC: Recent Labs  Lab 07/07/20 0811 07/08/20 0527  WBC 5.8 6.5  NEUTROABS  --  4.3  HGB 16.3 15.8  HCT 49.4 48.8  MCV 87.9 88.1  PLT 240 825   Basic Metabolic Panel: Recent Labs  Lab 07/07/20 0811 07/07/20 1016 07/08/20 0527  NA 138  --  138  K 6.1* 3.9 3.5  CL 106  --  104  CO2 24  --  26  GLUCOSE 115*  --  77  BUN 20  --  17  CREATININE 1.08  --  0.87  CALCIUM 9.1  --  9.0  MG  --   --  2.0  PHOS  --   --  3.9   GFR: CrCl cannot be calculated (Unknown ideal  weight.). Liver Function Tests: Recent Labs  Lab 07/07/20 0811 07/08/20 0527  AST 60* 39  ALT 25 24  ALKPHOS 95 98  BILITOT 1.7* 0.7  PROT 7.7 7.7  ALBUMIN 3.9 3.8   Recent Labs  Lab 07/07/20 0811  LIPASE 41   No results for input(s): AMMONIA in the last 168 hours. Coagulation Profile: No results for input(s): INR, PROTIME in the last 168 hours. Cardiac Enzymes: No results for input(s): CKTOTAL, CKMB, CKMBINDEX, TROPONINI in the last 168 hours. BNP (last 3 results) No results for input(s): PROBNP in the last 8760 hours. HbA1C: No results for input(s): HGBA1C in the last 72 hours. CBG: Recent Labs  Lab 07/07/20 2357 07/08/20 0350 07/08/20 0737 07/08/20 1139 07/08/20 1608  GLUCAP 132* 79 71 111* 162*   Lipid Profile: No results for input(s): CHOL, HDL, LDLCALC, TRIG, CHOLHDL, LDLDIRECT in the last 72 hours. Thyroid Function Tests:  No results for input(s): TSH, T4TOTAL, FREET4, T3FREE, THYROIDAB in the last 72 hours. Anemia Panel: No results for input(s): VITAMINB12, FOLATE, FERRITIN, TIBC, IRON, RETICCTPCT in the last 72 hours. Sepsis Labs: No results for input(s): PROCALCITON, LATICACIDVEN in the last 168 hours.  Recent Results (from the past 240 hour(s))  Resp Panel by RT-PCR (Flu A&B, Covid) Nasopharyngeal Swab     Status: None   Collection Time: 07/07/20  8:45 AM   Specimen: Nasopharyngeal Swab; Nasopharyngeal(NP) swabs in vial transport medium  Result Value Ref Range Status   SARS Coronavirus 2 by RT PCR NEGATIVE NEGATIVE Final    Comment: (NOTE) SARS-CoV-2 target nucleic acids are NOT DETECTED.  The SARS-CoV-2 RNA is generally detectable in upper respiratory specimens during the acute phase of infection. The lowest concentration of SARS-CoV-2 viral copies this assay can detect is 138 copies/mL. A negative result does not preclude SARS-Cov-2 infection and should not be used as the sole basis for treatment or other patient management decisions. A negative  result may occur with  improper specimen collection/handling, submission of specimen other than nasopharyngeal swab, presence of viral mutation(s) within the areas targeted by this assay, and inadequate number of viral copies(<138 copies/mL). A negative result must be combined with clinical observations, patient history, and epidemiological information. The expected result is Negative.  Fact Sheet for Patients:  EntrepreneurPulse.com.au  Fact Sheet for Healthcare Providers:  IncredibleEmployment.be  This test is no t yet approved or cleared by the [place] FDA and  has been authorized for detection and/or diagnosis of SARS-CoV-2 by FDA under an Emergency Use Authorization (EUA). This EUA will remain  in effect (meaning this test can be used) for the duration of the COVID-19 declaration under Section 564(b)(1) of the Act, 21 U.S.C.section 360bbb-3(b)(1), unless the authorization is terminated  or revoked sooner.       Influenza A by PCR NEGATIVE NEGATIVE Final   Influenza B by PCR NEGATIVE NEGATIVE Final    Comment: (NOTE) The Xpert Xpress SARS-CoV-2/FLU/RSV plus assay is intended as an aid in the diagnosis of influenza from Nasopharyngeal swab specimens and should not be used as a sole basis for treatment. Nasal washings and aspirates are unacceptable for Xpert Xpress SARS-CoV-2/FLU/RSV testing.  Fact Sheet for Patients: EntrepreneurPulse.com.au  Fact Sheet for Healthcare Providers: IncredibleEmployment.be  This test is not yet approved or cleared by the [place] FDA and has been authorized for detection and/or diagnosis of SARS-CoV-2 by FDA under an Emergency Use Authorization (EUA). This EUA will remain in effect (meaning this test can be used) for the duration of the COVID-19 declaration under Section 564(b)(1) of the Act, 21 U.S.C. section 360bbb-3(b)(1), unless the authorization is  terminated or revoked.  Performed at [facility], [place] [address], [place], [place] [address]   Surgical pcr screen     Status: None   Collection Time: 07/07/20  8:06 PM   Specimen: Nasal Mucosa; Nasal Swab  Result Value Ref Range Status   MRSA, PCR NEGATIVE NEGATIVE Final   Staphylococcus aureus NEGATIVE NEGATIVE Final    Comment: (NOTE) The Xpert SA Assay (FDA approved for NASAL specimens in patients 10 years of age and older), is one component of a comprehensive surveillance program. It is not intended to diagnose infection nor to guide or monitor treatment. Performed at [facility], [place] [address], [place], [place] [address]     RN Pressure Injury Documentation:     Estimated body mass index is 34.72 kg/m as  calculated from the following:   Height as of 11/29/19: 5\' 10"  (1.778 m).   Weight as of 11/29/19: 109.8 kg.  Malnutrition Type:   Malnutrition Characteristics:   Nutrition Interventions:   Radiology Studies: CT ABDOMEN PELVIS W CONTRAST  Result Date: 07/07/2020 CLINICAL DATA:  Upper abdominal pain for 1 month. History of gallstones EXAM: CT ABDOMEN AND PELVIS WITH CONTRAST TECHNIQUE: Multidetector CT imaging of the abdomen and pelvis was performed using the standard protocol following bolus administration of intravenous contrast. CONTRAST:  16mL OMNIPAQUE IOHEXOL 300 MG/ML  SOLN COMPARISON:  Ultrasound 07/07/2020, MRI 05/05/2020 FINDINGS: Lower chest: Mild dependent subsegmental atelectasis. Lung bases are otherwise clear. Patulous distal esophagus. Heart size within normal limits. Hepatobiliary: Numerous small primarily subcentimeter low-density lesions scattered throughout both hepatic lobes, possibly small cysts or biliary hamartomas. Multiple stones again seen within the gallbladder lumen. No pericholecystic inflammatory changes by CT. No biliary dilatation. Pancreas: Unremarkable. No pancreatic ductal dilatation or surrounding  inflammatory changes. Spleen: Normal in size without focal abnormality. Adrenals/Urinary Tract: Indeterminate density 1.8 cm left adrenal nodule. Unremarkable right adrenal gland. Redemonstrated 2.4 cm hyperdense right renal cyst, recently classified as a benign hyperdense cyst on MRI. Additional smaller cysts within the right kidney measuring up to 1.8 cm are unchanged. No left-sided renal lesion. No renal stone or hydronephrosis. Urinary bladder is unremarkable. Stomach/Bowel: Stomach is within normal limits. Appendix not definitively seen. Moderate volume of stool within the colon. No evidence of bowel wall thickening, distention, or inflammatory changes. Vascular/Lymphatic: Atherosclerosis throughout the aortoiliac axis without aneurysm. No abdominopelvic lymphadenopathy. Reproductive: Prostate is unremarkable. Other: No free fluid. No abdominopelvic fluid collection. No pneumoperitoneum. Small fat containing supraumbilical hernia. Musculoskeletal: No acute or significant osseous findings. Partially visualized intramuscular lipoma within the left quadratus femoris muscle measuring at least 5.4 x 2.7 cm. Numerous tiny intra-articular calcifications within the right hip joint suggesting synovial chondromatosis. IMPRESSION: 1. No acute abdominopelvic findings. 2. Cholelithiasis without evidence of acute cholecystitis. 3. Moderate volume of stool within the colon. 4. Small fat containing supraumbilical hernia. 5. Indeterminate 1.8 cm left adrenal nodule. 6. Partially visualized intramuscular lipoma within the left quadratus femoris muscle measuring at least 5.4 cm. 7. Numerous tiny intra-articular calcifications within the right hip joint suggesting synovial chondromatosis. Aortic Atherosclerosis (ICD10-I70.0). Electronically Signed   By: Davina Poke D.O.   On: 07/07/2020 12:31   DG Chest Portable 1 View  Result Date: 07/07/2020 CLINICAL DATA:  Chest pain. Additional provided: Patient reports upper  abdominal pain intermittently for 1 month since being diagnosed with gallstones. Pain worse this morning. History of diabetes and hypertension. EXAM: PORTABLE CHEST 1 VIEW COMPARISON:  Prior chest CT 04/12/2020. FINDINGS: Please note portions of the lateral costophrenic angles are excluded from the field of view bilaterally. Shallow inspiration radiograph. Cardiomegaly. Mild right perihilar/right basilar atelectasis. No appreciable airspace consolidation. No evidence of pleural effusion or pneumothorax. No acute bony abnormality identified IMPRESSION: Please note portions of the lateral costophrenic angles are excluded from the field of view bilaterally. Cardiomegaly. Shallow inspiration radiograph with mild right perihilar/right basilar atelectasis. No appreciable airspace consolidation or pulmonary edema. Electronically Signed   By: Kellie Simmering DO   On: 07/07/2020 10:02   ECHOCARDIOGRAM COMPLETE  Result Date: 07/08/2020    ECHOCARDIOGRAM REPORT   Patient Name:   Maurice Fox Date of Exam: 07/08/2020 Medical Rec #:  [id number]       Height:       70.0 in Accession #:    [id number]  Weight:       242.0 lb Date of Birth:  06-27-1950       BSA:          2.263 m Patient Age:    70 years        BP:           147/88 mmHg Patient Gender: M               HR:           93 bpm. Exam Location:  Inpatient Procedure: 2D Echo, Cardiac Doppler, Color Doppler and Strain Analysis Indications:    Elevated Troponin                 Abnormal ECG R94.31  History:        Patient has no prior history of Echocardiogram examinations.                 Risk Factors:Hypertension, Dyslipidemia, Sleep Apnea and                 Diabetes. Cholelithiasis.  Sonographer:    Darlina Sicilian RDCS Referring Phys: [id number] DAVID MANUEL [place]  1. Left ventricular ejection fraction, by estimation, is 50 to 55%. The left ventricle has low normal function. The left ventricle has no regional wall motion abnormalities. There is moderate  asymmetric left ventricular hypertrophy of the basal-septal segment. Left ventricular diastolic parameters are consistent with Grade I diastolic dysfunction (impaired relaxation). Elevated left ventricular end-diastolic pressure.  2. Right ventricular systolic function is normal. The right ventricular size is normal.  3. The mitral valve is grossly normal. Trivial mitral valve regurgitation.  4. The aortic valve is tricuspid. Aortic valve regurgitation is not visualized.  5. Aortic dilatation noted. There is borderline dilatation of the aortic root, measuring 38 mm.  6. The inferior vena cava is normal in size with greater than 50% respiratory variability, suggesting right atrial pressure of 3 mmHg. Comparison(s): No prior Echocardiogram. FINDINGS  Left Ventricle: Left ventricular ejection fraction, by estimation, is 50 to 55%. The left ventricle has low normal function. The left ventricle has no regional wall motion abnormalities. The left ventricular internal cavity size was normal in size. There is moderate asymmetric left ventricular hypertrophy of the basal-septal segment. Left ventricular diastolic parameters are consistent with Grade I diastolic dysfunction (impaired relaxation). Elevated left ventricular end-diastolic pressure. Right Ventricle: The right ventricular size is normal. No increase in right ventricular wall thickness. Right ventricular systolic function is normal. Left Atrium: Left atrial size was normal in size. Right Atrium: Right atrial size was normal in size. Pericardium: There is no evidence of pericardial effusion. Mitral Valve: The mitral valve is grossly normal. Trivial mitral valve regurgitation. Tricuspid Valve: The tricuspid valve is grossly normal. Tricuspid valve regurgitation is trivial. Aortic Valve: The aortic valve is tricuspid. Aortic valve regurgitation is not visualized. Pulmonic Valve: The pulmonic valve was grossly normal. Pulmonic valve regurgitation is not visualized.  Aorta: Aortic dilatation noted. There is borderline dilatation of the aortic root, measuring 38 mm. Venous: The inferior vena cava is normal in size with greater than 50% respiratory variability, suggesting right atrial pressure of 3 mmHg. IAS/Shunts: No atrial level shunt detected by color flow Doppler.  LEFT VENTRICLE PLAX 2D LVIDd:         4.70 cm      Diastology LVIDs:         3.70 cm      LV e' medial:  4.87 cm/s LV PW:         1.20 cm      LV E/e' medial:  16.3 LV IVS:        1.80 cm      LV e' lateral:   4.56 cm/s LVOT diam:     2.40 cm      LV E/e' lateral: 17.4 LV SV:         79 LV SV Index:   35 LVOT Area:     4.52 cm  LV Volumes (MOD) LV vol d, MOD A2C: 153.0 ml LV vol d, MOD A4C: 201.0 ml LV vol s, MOD A2C: 73.3 ml LV vol s, MOD A4C: 99.1 ml LV SV MOD A2C:     79.7 ml LV SV MOD A4C:     201.0 ml LV SV MOD BP:      90.3 ml RIGHT VENTRICLE RV S prime:     13.30 cm/s TAPSE (M-mode): 1.7 cm LEFT ATRIUM             Index LA diam:        3.90 cm 1.72 cm/m LA Vol (A2C):   39.7 ml 17.54 ml/m LA Vol (A4C):   29.5 ml 13.03 ml/m LA Biplane Vol: 34.8 ml 15.37 ml/m  AORTIC VALVE LVOT Vmax:   93.55 cm/s LVOT Vmean:  61.650 cm/s LVOT VTI:    0.174 m  AORTA Ao Root diam: 3.90 cm Ao Asc diam:  3.70 cm MITRAL VALVE MV Area (PHT): 5.54 cm     SHUNTS MV Decel Time: 137 msec     Systemic VTI:  0.17 m MV E velocity: 79.40 cm/s   Systemic Diam: 2.40 cm MV A velocity: 114.00 cm/s MV E/A ratio:  0.70 Lyman Bishop MD Electronically signed by Lyman Bishop MD Signature Date/Time: 07/08/2020/12:17:34 PM    Final    US Abdomen Limited RUQ (LIVER/GB)  Result Date: 07/07/2020 CLINICAL DATA:  70 year old male with right upper quadrant abdominal pain this morning. EXAM: ULTRASOUND ABDOMEN LIMITED RIGHT UPPER QUADRANT COMPARISON:  Abdomen MRI 05/05/2020. ultrasound 06/04/2020. FINDINGS: Gallbladder: Gallstone again noted, this measured up to 2.7 cm on the April MRI. Gallbladder wall thickness remains normal at 2 mm. No  pericholecystic fluid. No sonographic Murphy sign elicited. Common bile duct: Diameter: 4 mm, normal. Liver: There was no discrete liver lesion on the April MRI without and with contrast, or the ultrasound last month. Therefore, the circumscribed anechoic appearing areas identified today (such as on image 21) are felt to be prominent hepatic veins. Portal vein is patent on color Doppler imaging with normal direction of blood flow towards the liver. Other: Negative visible right kidney. IMPRESSION: 1. Chronic cholelithiasis without evidence of acute cholecystitis. 2. Suspect increased size of the hepatic veins from the prior studies this year. Query volume overload, CHF. Electronically Signed   By: Genevie Ann M.D.   On: 07/07/2020 09:41    Scheduled Meds:  acetaminophen  1,000 mg Oral Q8H   amLODipine  5 mg Oral QHS   atorvastatin  10 mg Oral QHS   enoxaparin (LOVENOX) injection  40 mg Subcutaneous Q24H   fluticasone  2 spray Each Nare Daily   insulin aspart  0-20 Units Subcutaneous Q4H   pantoprazole (PROTONIX) IV  40 mg Intravenous QHS   polyvinyl alcohol  1 drop Both Eyes BID   tamsulosin  0.4 mg Oral QHS   Continuous Infusions:  sodium chloride 100 mL/hr at 07/08/20 0821   cefTRIAXone (ROCEPHIN)  IV Stopped (07/07/20 1949)   metronidazole 500 mg (07/08/20 1040)    LOS: 0 days   Kerney Elbe, DO [facility] PAGER is on AMION  If 7PM-7AM, please contact night-coverage [email or web address]

## 2020-07-08 NOTE — Progress Notes (Signed)
  Echocardiogram 2D Echocardiogram with strain has been performed.  Darlina Sicilian M 07/08/2020, 8:08 AM

## 2020-07-08 NOTE — H&P (View-Only) (Signed)
* Surgery Date in Future *   Subjective/Chief Complaint: Pain coming and going at times No n/v TRH ordered echo which was just completed   Objective: Vital signs in last 24 hours: Temp:  [98.4 F (36.9 C)-99.8 F (37.7 C)] 98.4 F (36.9 C) (06/19 0540) Pulse Rate:  [87-105] 93 (06/19 0540) Resp:  [17-28] 18 (06/19 0540) BP: (135-166)/(72-91) 147/88 (06/19 0540) SpO2:  [96 %-100 %] 99 % (06/19 0540) Last BM Date: 07/06/20  Intake/Output from previous day: 06/18 0701 - 06/19 0700 In: 1840.6 [P.O.:600; I.V.:840.6; IV Piggyback:400] Out: 0  Intake/Output this shift: No intake/output data recorded.  Alert, nontoxic, nad Looks more comfortable this am than yesterday in ed Symm chest rise Reg Soft, mild TTP in RUQ, periumbilical hernia -soft No edema  Lab Results:  Recent Labs    07/07/20 0811 07/08/20 0527  WBC 5.8 6.5  HGB 16.3 15.8  HCT 49.4 48.8  PLT 240 242   BMET Recent Labs    07/07/20 0811 07/07/20 1016 07/08/20 0527  NA 138  --  138  K 6.1* 3.9 3.5  CL 106  --  104  CO2 24  --  26  GLUCOSE 115*  --  77  BUN 20  --  17  CREATININE 1.08  --  0.87  CALCIUM 9.1  --  9.0   PT/INR No results for input(s): LABPROT, INR in the last 72 hours. ABG No results for input(s): PHART, HCO3 in the last 72 hours.  Invalid input(s): PCO2, PO2  Studies/Results: CT ABDOMEN PELVIS W CONTRAST  Result Date: 07/07/2020 CLINICAL DATA:  Upper abdominal pain for 1 month. History of gallstones EXAM: CT ABDOMEN AND PELVIS WITH CONTRAST TECHNIQUE: Multidetector CT imaging of the abdomen and pelvis was performed using the standard protocol following bolus administration of intravenous contrast. CONTRAST:  166mL OMNIPAQUE IOHEXOL 300 MG/ML  SOLN COMPARISON:  Ultrasound 07/07/2020, MRI 05/05/2020 FINDINGS: Lower chest: Mild dependent subsegmental atelectasis. Lung bases are otherwise clear. Patulous distal esophagus. Heart size within normal limits. Hepatobiliary: Numerous small  primarily subcentimeter low-density lesions scattered throughout both hepatic lobes, possibly small cysts or biliary hamartomas. Multiple stones again seen within the gallbladder lumen. No pericholecystic inflammatory changes by CT. No biliary dilatation. Pancreas: Unremarkable. No pancreatic ductal dilatation or surrounding inflammatory changes. Spleen: Normal in size without focal abnormality. Adrenals/Urinary Tract: Indeterminate density 1.8 cm left adrenal nodule. Unremarkable right adrenal gland. Redemonstrated 2.4 cm hyperdense right renal cyst, recently classified as a benign hyperdense cyst on MRI. Additional smaller cysts within the right kidney measuring up to 1.8 cm are unchanged. No left-sided renal lesion. No renal stone or hydronephrosis. Urinary bladder is unremarkable. Stomach/Bowel: Stomach is within normal limits. Appendix not definitively seen. Moderate volume of stool within the colon. No evidence of bowel wall thickening, distention, or inflammatory changes. Vascular/Lymphatic: Atherosclerosis throughout the aortoiliac axis without aneurysm. No abdominopelvic lymphadenopathy. Reproductive: Prostate is unremarkable. Other: No free fluid. No abdominopelvic fluid collection. No pneumoperitoneum. Small fat containing supraumbilical hernia. Musculoskeletal: No acute or significant osseous findings. Partially visualized intramuscular lipoma within the left quadratus femoris muscle measuring at least 5.4 x 2.7 cm. Numerous tiny intra-articular calcifications within the right hip joint suggesting synovial chondromatosis. IMPRESSION: 1. No acute abdominopelvic findings. 2. Cholelithiasis without evidence of acute cholecystitis. 3. Moderate volume of stool within the colon. 4. Small fat containing supraumbilical hernia. 5. Indeterminate 1.8 cm left adrenal nodule. 6. Partially visualized intramuscular lipoma within the left quadratus femoris muscle measuring at least 5.4 cm. 7.  Numerous tiny  intra-articular calcifications within the right hip joint suggesting synovial chondromatosis. Aortic Atherosclerosis (ICD10-I70.0). Electronically Signed   By: Davina Poke D.O.   On: 07/07/2020 12:31   DG Chest Portable 1 View  Result Date: 07/07/2020 CLINICAL DATA:  Chest pain. Additional provided: Patient reports upper abdominal pain intermittently for 1 month since being diagnosed with gallstones. Pain worse this morning. History of diabetes and hypertension. EXAM: PORTABLE CHEST 1 VIEW COMPARISON:  Prior chest CT 04/12/2020. FINDINGS: Please note portions of the lateral costophrenic angles are excluded from the field of view bilaterally. Shallow inspiration radiograph. Cardiomegaly. Mild right perihilar/right basilar atelectasis. No appreciable airspace consolidation. No evidence of pleural effusion or pneumothorax. No acute bony abnormality identified IMPRESSION: Please note portions of the lateral costophrenic angles are excluded from the field of view bilaterally. Cardiomegaly. Shallow inspiration radiograph with mild right perihilar/right basilar atelectasis. No appreciable airspace consolidation or pulmonary edema. Electronically Signed   By: Kellie Simmering DO   On: 07/07/2020 10:02   US Abdomen Limited RUQ (LIVER/GB)  Result Date: 07/07/2020 CLINICAL DATA:  70 year old male with right upper quadrant abdominal pain this morning. EXAM: ULTRASOUND ABDOMEN LIMITED RIGHT UPPER QUADRANT COMPARISON:  Abdomen MRI 05/05/2020. ultrasound 06/04/2020. FINDINGS: Gallbladder: Gallstone again noted, this measured up to 2.7 cm on the April MRI. Gallbladder wall thickness remains normal at 2 mm. No pericholecystic fluid. No sonographic Murphy sign elicited. Common bile duct: Diameter: 4 mm, normal. Liver: There was no discrete liver lesion on the April MRI without and with contrast, or the ultrasound last month. Therefore, the circumscribed anechoic appearing areas identified today (such as on image 21) are felt  to be prominent hepatic veins. Portal vein is patent on color Doppler imaging with normal direction of blood flow towards the liver. Other: Negative visible right kidney. IMPRESSION: 1. Chronic cholelithiasis without evidence of acute cholecystitis. 2. Suspect increased size of the hepatic veins from the prior studies this year. Query volume overload, CHF. Electronically Signed   By: Genevie Ann M.D.   On: 07/07/2020 09:41    Anti-infectives: Anti-infectives (From admission, onward)    Start     Dose/Rate Route Frequency Ordered Stop   07/07/20 1800  cefTRIAXone (ROCEPHIN) 2 g in sodium chloride 0.9 % 100 mL IVPB        2 g 200 mL/hr over 30 Minutes Intravenous Every 24 hours 07/07/20 1721     07/07/20 1800  metroNIDAZOLE (FLAGYL) IVPB 500 mg        500 mg 100 mL/hr over 60 Minutes Intravenous Every 8 hours 07/07/20 1732     07/07/20 1700  piperacillin-tazobactam (ZOSYN) IVPB 3.375 g  Status:  Discontinued        3.375 g 12.5 mL/hr over 240 Minutes Intravenous Every 8 hours 07/07/20 1612 07/07/20 1732   07/07/20 1615  piperacillin-tazobactam (ZOSYN) IVPB 3.375 g  Status:  Discontinued        3.375 g 100 mL/hr over 30 Minutes Intravenous Every 8 hours 07/07/20 1606 07/07/20 1611       Assessment/Plan: Symptomatic cholelithiasis, probably early acute cholecystitis given the fact that he is still uncomfortable and having pain Obesity OSA on cpap HPL HTN DM2  Await echo read  Therefore will postpone surgery to Monday AM Discussed reasoning for this with pt Can have diet today NPO x meds after MN Cont IV abx Cont vte prophylaxis - lovenox Cpap for osa Home meds Appreciate TRH assist  Leighton Ruff. Redmond Pulling, MD, FACS General, Bariatric, & Minimally  Invasive Surgery [facility], [place]   LOS: 0 days    Greer Pickerel 07/08/2020

## 2020-07-08 NOTE — Progress Notes (Addendum)
* Surgery Date in Future *   Subjective/Chief Complaint: Pain coming and going at times No n/v TRH ordered echo which was just completed   Objective: Vital signs in last 24 hours: Temp:  [98.4 F (36.9 C)-99.8 F (37.7 C)] 98.4 F (36.9 C) (06/19 0540) Pulse Rate:  [87-105] 93 (06/19 0540) Resp:  [17-28] 18 (06/19 0540) BP: (135-166)/(72-91) 147/88 (06/19 0540) SpO2:  [96 %-100 %] 99 % (06/19 0540) Last BM Date: 07/06/20  Intake/Output from previous day: 06/18 0701 - 06/19 0700 In: 1840.6 [P.O.:600; I.V.:840.6; IV Piggyback:400] Out: 0  Intake/Output this shift: No intake/output data recorded.  Alert, nontoxic, nad Looks more comfortable this am than yesterday in ed Symm chest rise Reg Soft, mild TTP in RUQ, periumbilical hernia -soft No edema  Lab Results:  Recent Labs    07/07/20 0811 07/08/20 0527  WBC 5.8 6.5  HGB 16.3 15.8  HCT 49.4 48.8  PLT 240 242   BMET Recent Labs    07/07/20 0811 07/07/20 1016 07/08/20 0527  NA 138  --  138  K 6.1* 3.9 3.5  CL 106  --  104  CO2 24  --  26  GLUCOSE 115*  --  77  BUN 20  --  17  CREATININE 1.08  --  0.87  CALCIUM 9.1  --  9.0   PT/INR No results for input(s): LABPROT, INR in the last 72 hours. ABG No results for input(s): PHART, HCO3 in the last 72 hours.  Invalid input(s): PCO2, PO2  Studies/Results: CT ABDOMEN PELVIS W CONTRAST  Result Date: 07/07/2020 CLINICAL DATA:  Upper abdominal pain for 1 month. History of gallstones EXAM: CT ABDOMEN AND PELVIS WITH CONTRAST TECHNIQUE: Multidetector CT imaging of the abdomen and pelvis was performed using the standard protocol following bolus administration of intravenous contrast. CONTRAST:  126mL OMNIPAQUE IOHEXOL 300 MG/ML  SOLN COMPARISON:  Ultrasound 07/07/2020, MRI 05/05/2020 FINDINGS: Lower chest: Mild dependent subsegmental atelectasis. Lung bases are otherwise clear. Patulous distal esophagus. Heart size within normal limits. Hepatobiliary: Numerous small  primarily subcentimeter low-density lesions scattered throughout both hepatic lobes, possibly small cysts or biliary hamartomas. Multiple stones again seen within the gallbladder lumen. No pericholecystic inflammatory changes by CT. No biliary dilatation. Pancreas: Unremarkable. No pancreatic ductal dilatation or surrounding inflammatory changes. Spleen: Normal in size without focal abnormality. Adrenals/Urinary Tract: Indeterminate density 1.8 cm left adrenal nodule. Unremarkable right adrenal gland. Redemonstrated 2.4 cm hyperdense right renal cyst, recently classified as a benign hyperdense cyst on MRI. Additional smaller cysts within the right kidney measuring up to 1.8 cm are unchanged. No left-sided renal lesion. No renal stone or hydronephrosis. Urinary bladder is unremarkable. Stomach/Bowel: Stomach is within normal limits. Appendix not definitively seen. Moderate volume of stool within the colon. No evidence of bowel wall thickening, distention, or inflammatory changes. Vascular/Lymphatic: Atherosclerosis throughout the aortoiliac axis without aneurysm. No abdominopelvic lymphadenopathy. Reproductive: Prostate is unremarkable. Other: No free fluid. No abdominopelvic fluid collection. No pneumoperitoneum. Small fat containing supraumbilical hernia. Musculoskeletal: No acute or significant osseous findings. Partially visualized intramuscular lipoma within the left quadratus femoris muscle measuring at least 5.4 x 2.7 cm. Numerous tiny intra-articular calcifications within the right hip joint suggesting synovial chondromatosis. IMPRESSION: 1. No acute abdominopelvic findings. 2. Cholelithiasis without evidence of acute cholecystitis. 3. Moderate volume of stool within the colon. 4. Small fat containing supraumbilical hernia. 5. Indeterminate 1.8 cm left adrenal nodule. 6. Partially visualized intramuscular lipoma within the left quadratus femoris muscle measuring at least 5.4 cm. 7.  Numerous tiny  intra-articular calcifications within the right hip joint suggesting synovial chondromatosis. Aortic Atherosclerosis (ICD10-I70.0). Electronically Signed   By: Davina Poke D.O.   On: 07/07/2020 12:31   DG Chest Portable 1 View  Result Date: 07/07/2020 CLINICAL DATA:  Chest pain. Additional provided: Patient reports upper abdominal pain intermittently for 1 month since being diagnosed with gallstones. Pain worse this morning. History of diabetes and hypertension. EXAM: PORTABLE CHEST 1 VIEW COMPARISON:  Prior chest CT 04/12/2020. FINDINGS: Please note portions of the lateral costophrenic angles are excluded from the field of view bilaterally. Shallow inspiration radiograph. Cardiomegaly. Mild right perihilar/right basilar atelectasis. No appreciable airspace consolidation. No evidence of pleural effusion or pneumothorax. No acute bony abnormality identified IMPRESSION: Please note portions of the lateral costophrenic angles are excluded from the field of view bilaterally. Cardiomegaly. Shallow inspiration radiograph with mild right perihilar/right basilar atelectasis. No appreciable airspace consolidation or pulmonary edema. Electronically Signed   By: Kellie Simmering DO   On: 07/07/2020 10:02   US Abdomen Limited RUQ (LIVER/GB)  Result Date: 07/07/2020 CLINICAL DATA:  70 year old male with right upper quadrant abdominal pain this morning. EXAM: ULTRASOUND ABDOMEN LIMITED RIGHT UPPER QUADRANT COMPARISON:  Abdomen MRI 05/05/2020. ultrasound 06/04/2020. FINDINGS: Gallbladder: Gallstone again noted, this measured up to 2.7 cm on the April MRI. Gallbladder wall thickness remains normal at 2 mm. No pericholecystic fluid. No sonographic Murphy sign elicited. Common bile duct: Diameter: 4 mm, normal. Liver: There was no discrete liver lesion on the April MRI without and with contrast, or the ultrasound last month. Therefore, the circumscribed anechoic appearing areas identified today (such as on image 21) are felt  to be prominent hepatic veins. Portal vein is patent on color Doppler imaging with normal direction of blood flow towards the liver. Other: Negative visible right kidney. IMPRESSION: 1. Chronic cholelithiasis without evidence of acute cholecystitis. 2. Suspect increased size of the hepatic veins from the prior studies this year. Query volume overload, CHF. Electronically Signed   By: Genevie Ann M.D.   On: 07/07/2020 09:41    Anti-infectives: Anti-infectives (From admission, onward)    Start     Dose/Rate Route Frequency Ordered Stop   07/07/20 1800  cefTRIAXone (ROCEPHIN) 2 g in sodium chloride 0.9 % 100 mL IVPB        2 g 200 mL/hr over 30 Minutes Intravenous Every 24 hours 07/07/20 1721     07/07/20 1800  metroNIDAZOLE (FLAGYL) IVPB 500 mg        500 mg 100 mL/hr over 60 Minutes Intravenous Every 8 hours 07/07/20 1732     07/07/20 1700  piperacillin-tazobactam (ZOSYN) IVPB 3.375 g  Status:  Discontinued        3.375 g 12.5 mL/hr over 240 Minutes Intravenous Every 8 hours 07/07/20 1612 07/07/20 1732   07/07/20 1615  piperacillin-tazobactam (ZOSYN) IVPB 3.375 g  Status:  Discontinued        3.375 g 100 mL/hr over 30 Minutes Intravenous Every 8 hours 07/07/20 1606 07/07/20 1611       Assessment/Plan: Symptomatic cholelithiasis, probably early acute cholecystitis given the fact that he is still uncomfortable and having pain Obesity OSA on cpap HPL HTN DM2  Await echo read  Therefore will postpone surgery to Monday AM Discussed reasoning for this with pt Can have diet today NPO x meds after MN Cont IV abx Cont vte prophylaxis - lovenox Cpap for osa Home meds Appreciate TRH assist  Leighton Ruff. Redmond Pulling, MD, FACS General, Bariatric, & Minimally  Invasive Surgery [facility], [place]   LOS: 0 days    Greer Pickerel 07/08/2020

## 2020-07-09 ENCOUNTER — Observation Stay (HOSPITAL_COMMUNITY): Payer: Medicare HMO | Admitting: Certified Registered Nurse Anesthetist

## 2020-07-09 ENCOUNTER — Observation Stay (HOSPITAL_COMMUNITY): Payer: Medicare HMO

## 2020-07-09 ENCOUNTER — Encounter (HOSPITAL_COMMUNITY)
Admission: EM | Disposition: A | Discharge status: Home / Self Care | Payer: Self-pay | Source: Home / Self Care | Attending: Emergency Medicine

## 2020-07-09 DIAGNOSIS — K812 Acute cholecystitis with chronic cholecystitis: Secondary | ICD-10-CM | POA: Diagnosis not present

## 2020-07-09 DIAGNOSIS — Z20822 Contact with and (suspected) exposure to covid-19: Secondary | ICD-10-CM | POA: Diagnosis not present

## 2020-07-09 DIAGNOSIS — E785 Hyperlipidemia, unspecified: Secondary | ICD-10-CM | POA: Diagnosis not present

## 2020-07-09 DIAGNOSIS — K8010 Calculus of gallbladder with chronic cholecystitis without obstruction: Secondary | ICD-10-CM | POA: Diagnosis not present

## 2020-07-09 DIAGNOSIS — R778 Other specified abnormalities of plasma proteins: Secondary | ICD-10-CM | POA: Diagnosis not present

## 2020-07-09 DIAGNOSIS — K439 Ventral hernia without obstruction or gangrene: Secondary | ICD-10-CM | POA: Diagnosis not present

## 2020-07-09 DIAGNOSIS — C866 Primary cutaneous CD30-positive T-cell proliferations: Secondary | ICD-10-CM | POA: Diagnosis not present

## 2020-07-09 DIAGNOSIS — C8339 Diffuse large B-cell lymphoma, extranodal and solid organ sites: Secondary | ICD-10-CM | POA: Diagnosis not present

## 2020-07-09 DIAGNOSIS — Z9049 Acquired absence of other specified parts of digestive tract: Secondary | ICD-10-CM | POA: Diagnosis not present

## 2020-07-09 DIAGNOSIS — K8012 Calculus of gallbladder with acute and chronic cholecystitis without obstruction: Secondary | ICD-10-CM | POA: Diagnosis not present

## 2020-07-09 DIAGNOSIS — E1165 Type 2 diabetes mellitus with hyperglycemia: Secondary | ICD-10-CM | POA: Diagnosis not present

## 2020-07-09 DIAGNOSIS — K769 Liver disease, unspecified: Secondary | ICD-10-CM | POA: Diagnosis not present

## 2020-07-09 DIAGNOSIS — Z79899 Other long term (current) drug therapy: Secondary | ICD-10-CM | POA: Diagnosis not present

## 2020-07-09 DIAGNOSIS — F17210 Nicotine dependence, cigarettes, uncomplicated: Secondary | ICD-10-CM | POA: Diagnosis not present

## 2020-07-09 DIAGNOSIS — K810 Acute cholecystitis: Secondary | ICD-10-CM | POA: Diagnosis not present

## 2020-07-09 DIAGNOSIS — G4733 Obstructive sleep apnea (adult) (pediatric): Secondary | ICD-10-CM | POA: Diagnosis not present

## 2020-07-09 DIAGNOSIS — I10 Essential (primary) hypertension: Secondary | ICD-10-CM | POA: Diagnosis not present

## 2020-07-09 DIAGNOSIS — E119 Type 2 diabetes mellitus without complications: Secondary | ICD-10-CM | POA: Diagnosis not present

## 2020-07-09 DIAGNOSIS — Z9989 Dependence on other enabling machines and devices: Secondary | ICD-10-CM | POA: Diagnosis not present

## 2020-07-09 HISTORY — PX: SUPRA-UMBILICAL HERNIA: SHX6105

## 2020-07-09 HISTORY — PX: LIVER BIOPSY: SHX301

## 2020-07-09 HISTORY — PX: CHOLECYSTECTOMY: SHX55

## 2020-07-09 LAB — GLUCOSE, CAPILLARY
Glucose-Capillary: 70 mg/dL (ref 70–99)
Glucose-Capillary: 85 mg/dL (ref 70–99)
Glucose-Capillary: 78 mg/dL (ref 70–99)
Glucose-Capillary: 149 mg/dL — ABNORMAL HIGH (ref 70–99)
Glucose-Capillary: 122 mg/dL — ABNORMAL HIGH (ref 70–99)
Glucose-Capillary: 219 mg/dL — ABNORMAL HIGH (ref 70–99)

## 2020-07-09 LAB — CBC WITH DIFFERENTIAL/PLATELET
Abs Immature Granulocytes: 0.02 10*3/uL (ref 0.00–0.07)
Basophils Absolute: 0 10*3/uL (ref 0.0–0.1)
Basophils Relative: 1 %
Eosinophils Absolute: 0.1 10*3/uL (ref 0.0–0.5)
Eosinophils Relative: 1 %
HCT: 44.9 % (ref 39.0–52.0)
Hemoglobin: 14.7 g/dL (ref 13.0–17.0)
Immature Granulocytes: 0 %
Lymphocytes Relative: 19 %
Lymphs Abs: 1.2 10*3/uL (ref 0.7–4.0)
MCH: 28.7 pg (ref 26.0–34.0)
MCHC: 32.7 g/dL (ref 30.0–36.0)
MCV: 87.5 fL (ref 80.0–100.0)
Monocytes Absolute: 1 10*3/uL (ref 0.1–1.0)
Monocytes Relative: 16 %
Neutro Abs: 4 10*3/uL (ref 1.7–7.7)
Neutrophils Relative %: 63 %
Platelets: 214 10*3/uL (ref 150–400)
RBC: 5.13 MIL/uL (ref 4.22–5.81)
RDW: 13 % (ref 11.5–15.5)
WBC: 6.4 10*3/uL (ref 4.0–10.5)
nRBC: 0 % (ref 0.0–0.2)

## 2020-07-09 LAB — COMPREHENSIVE METABOLIC PANEL
ALT: 26 U/L (ref 0–44)
AST: 36 U/L (ref 15–41)
Albumin: 3.4 g/dL — ABNORMAL LOW (ref 3.5–5.0)
Alkaline Phosphatase: 97 U/L (ref 38–126)
Anion gap: 9 (ref 5–15)
BUN: 19 mg/dL (ref 8–23)
CO2: 23 mmol/L (ref 22–32)
Calcium: 8.8 mg/dL — ABNORMAL LOW (ref 8.9–10.3)
Chloride: 106 mmol/L (ref 98–111)
Creatinine, Ser: 0.85 mg/dL (ref 0.61–1.24)
GFR, Estimated: >60 mL/min (ref >60)
Glucose, Bld: 79 mg/dL (ref 70–99)
Potassium: 3.5 mmol/L (ref 3.5–5.1)
Sodium: 138 mmol/L (ref 135–145)
Total Bilirubin: 0.7 mg/dL (ref 0.3–1.2)
Total Protein: 6.9 g/dL (ref 6.5–8.1)

## 2020-07-09 LAB — HEMOGLOBIN A1C
Hgb A1c MFr Bld: 7.1 % — ABNORMAL HIGH (ref 4.8–5.6)
Mean Plasma Glucose: 157 mg/dL

## 2020-07-09 LAB — PHOSPHORUS: Phosphorus: 3.9 mg/dL (ref 2.5–4.6)

## 2020-07-09 LAB — MAGNESIUM: Magnesium: 2 mg/dL (ref 1.7–2.4)

## 2020-07-09 SURGERY — LAPAROSCOPIC CHOLECYSTECTOMY WITH INTRAOPERATIVE CHOLANGIOGRAM
Anesthesia: General

## 2020-07-09 MED ORDER — DEXMEDETOMIDINE (PRECEDEX) IN NS 20 MCG/5ML (4 MCG/ML) IV SYRINGE
PREFILLED_SYRINGE | INTRAVENOUS | Status: DC | PRN
  Administered 2020-07-09: 8 ug via INTRAVENOUS
  Administered 2020-07-09: 12 ug via INTRAVENOUS

## 2020-07-09 MED ORDER — FENTANYL CITRATE (PF) 100 MCG/2ML IJ SOLN
INTRAMUSCULAR | Status: DC | PRN
  Administered 2020-07-09: 100 ug via INTRAVENOUS
  Administered 2020-07-09 (×2): 50 ug via INTRAVENOUS

## 2020-07-09 MED ORDER — ONDANSETRON HCL 4 MG/2ML IJ SOLN: 4.0000 mg | Freq: Once | INTRAMUSCULAR | Status: DC | PRN

## 2020-07-09 MED ORDER — DEXAMETHASONE SODIUM PHOSPHATE 10 MG/ML IJ SOLN
INTRAMUSCULAR | Status: AC
  Filled 2020-07-09: qty 1

## 2020-07-09 MED ORDER — OXYCODONE HCL 5 MG PO TABS: 5.0000 mg | ORAL_TABLET | Freq: Once | ORAL | Status: DC | PRN

## 2020-07-09 MED ORDER — PROPOFOL 10 MG/ML IV BOLUS
INTRAVENOUS | Status: DC | PRN
  Administered 2020-07-09: 150 mg via INTRAVENOUS
  Administered 2020-07-09: 50 mg via INTRAVENOUS

## 2020-07-09 MED ORDER — OXYCODONE HCL 5 MG/5ML PO SOLN: 5.0000 mg | Freq: Once | ORAL | Status: DC | PRN

## 2020-07-09 MED ORDER — MIDAZOLAM HCL 2 MG/2ML IJ SOLN
INTRAMUSCULAR | Status: AC
  Filled 2020-07-09: qty 2

## 2020-07-09 MED ORDER — BUPIVACAINE-EPINEPHRINE 0.25% -1:200000 IJ SOLN
INTRAMUSCULAR | Status: DC | PRN
  Administered 2020-07-09: 22 mL

## 2020-07-09 MED ORDER — SODIUM CHLORIDE 0.9 % IV SOLN
INTRAVENOUS | Status: DC | PRN
  Administered 2020-07-09: 10 mL

## 2020-07-09 MED ORDER — CHLORHEXIDINE GLUCONATE CLOTH 2 % EX PADS: 6.0000 | MEDICATED_PAD | Freq: Once | CUTANEOUS | Status: DC

## 2020-07-09 MED ORDER — MORPHINE SULFATE (PF) 2 MG/ML IV SOLN: 1.0000 mg | INTRAVENOUS | Status: DC | PRN

## 2020-07-09 MED ORDER — ACETAMINOPHEN 500 MG PO TABS: 1000.0000 mg | ORAL_TABLET | ORAL | Status: DC

## 2020-07-09 MED ORDER — ALBUMIN HUMAN 5 % IV SOLN
INTRAVENOUS | Status: AC
  Filled 2020-07-09: qty 250

## 2020-07-09 MED ORDER — CEFAZOLIN SODIUM-DEXTROSE 2-4 GM/100ML-% IV SOLN: 2.0000 g | INTRAVENOUS | Status: DC

## 2020-07-09 MED ORDER — FENTANYL CITRATE (PF) 100 MCG/2ML IJ SOLN
INTRAMUSCULAR | Status: AC
  Filled 2020-07-09: qty 2

## 2020-07-09 MED ORDER — ACETAMINOPHEN 160 MG/5ML PO SOLN: 325.0000 mg | ORAL | Status: DC | PRN

## 2020-07-09 MED ORDER — GABAPENTIN 300 MG PO CAPS: 300.0000 mg | ORAL_CAPSULE | ORAL | Status: DC

## 2020-07-09 MED ORDER — OXYCODONE HCL 5 MG PO TABS
5.0000 mg | ORAL_TABLET | ORAL | Status: DC | PRN
  Administered 2020-07-09: 10 mg via ORAL
  Filled 2020-07-09: qty 2

## 2020-07-09 MED ORDER — SUGAMMADEX SODIUM 200 MG/2ML IV SOLN
INTRAVENOUS | Status: DC | PRN
  Administered 2020-07-09: 200 mg via INTRAVENOUS

## 2020-07-09 MED ORDER — DEXAMETHASONE SODIUM PHOSPHATE 10 MG/ML IJ SOLN
INTRAMUSCULAR | Status: DC | PRN
  Administered 2020-07-09: 10 mg via INTRAVENOUS

## 2020-07-09 MED ORDER — ONDANSETRON HCL 4 MG/2ML IJ SOLN
INTRAMUSCULAR | Status: DC | PRN
  Administered 2020-07-09: 4 mg via INTRAVENOUS

## 2020-07-09 MED ORDER — MEPERIDINE HCL 50 MG/ML IJ SOLN: 6.2500 mg | INTRAMUSCULAR | Status: DC | PRN

## 2020-07-09 MED ORDER — LACTATED RINGERS IV SOLN: INTRAVENOUS | Status: DC | PRN

## 2020-07-09 MED ORDER — MIDAZOLAM HCL 5 MG/5ML IJ SOLN
INTRAMUSCULAR | Status: DC | PRN
  Administered 2020-07-09: 2 mg via INTRAVENOUS

## 2020-07-09 MED ORDER — BUPIVACAINE-EPINEPHRINE (PF) 0.25% -1:200000 IJ SOLN
INTRAMUSCULAR | Status: AC
  Filled 2020-07-09: qty 30

## 2020-07-09 MED ORDER — ROCURONIUM BROMIDE 10 MG/ML (PF) SYRINGE
PREFILLED_SYRINGE | INTRAVENOUS | Status: AC
  Filled 2020-07-09: qty 10

## 2020-07-09 MED ORDER — ROCURONIUM BROMIDE 10 MG/ML (PF) SYRINGE
PREFILLED_SYRINGE | INTRAVENOUS | Status: DC | PRN
  Administered 2020-07-09: 20 mg via INTRAVENOUS
  Administered 2020-07-09: 50 mg via INTRAVENOUS
  Administered 2020-07-09: 30 mg via INTRAVENOUS

## 2020-07-09 MED ORDER — CELECOXIB 200 MG PO CAPS: 200.0000 mg | ORAL_CAPSULE | ORAL | Status: DC

## 2020-07-09 MED ORDER — ONDANSETRON HCL 4 MG/2ML IJ SOLN
INTRAMUSCULAR | Status: AC
  Filled 2020-07-09: qty 2

## 2020-07-09 MED ORDER — LIDOCAINE 2% (20 MG/ML) 5 ML SYRINGE
INTRAMUSCULAR | Status: DC | PRN
  Administered 2020-07-09: 80 mg via INTRAVENOUS

## 2020-07-09 MED ORDER — ACETAMINOPHEN 325 MG PO TABS: 325.0000 mg | ORAL_TABLET | ORAL | Status: DC | PRN

## 2020-07-09 MED ORDER — LIDOCAINE 2% (20 MG/ML) 5 ML SYRINGE
INTRAMUSCULAR | Status: AC
  Filled 2020-07-09: qty 5

## 2020-07-09 MED ORDER — FENTANYL CITRATE (PF) 100 MCG/2ML IJ SOLN: 25.0000 ug | INTRAMUSCULAR | Status: DC | PRN

## 2020-07-09 SURGICAL SUPPLY — 64 items
ADH SKN CLS APL DERMABOND .7 (GAUZE/BANDAGES/DRESSINGS)
APL PRP STRL LF DISP 70% ISPRP (MISCELLANEOUS) ×1
APL SKNCLS STERI-STRIP NONHPOA (GAUZE/BANDAGES/DRESSINGS)
APL SRG 38 LTWT LNG FL B (MISCELLANEOUS)
APPLICATOR ARISTA FLEXITIP XL (MISCELLANEOUS) IMPLANT
APPLIER CLIP 5 13 M/L LIGAMAX5 (MISCELLANEOUS) ×2
APPLIER CLIP ROT 10 11.4 M/L (STAPLE)
APR CLP MED LRG 11.4X10 (STAPLE)
APR CLP MED LRG 5 ANG JAW (MISCELLANEOUS) ×1
BAG COUNTER SPONGE SURGICOUNT (BAG) IMPLANT
BAG SPEC RTRVL 10 TROC 200 (ENDOMECHANICALS) ×1
BAG SPNG CNTER NS LX DISP (BAG)
BENZOIN TINCTURE PRP APPL 2/3 (GAUZE/BANDAGES/DRESSINGS) IMPLANT
BNDG ADH 1X3 SHEER STRL LF (GAUZE/BANDAGES/DRESSINGS) ×8 IMPLANT
BNDG ADH THN 3X1 STRL LF (GAUZE/BANDAGES/DRESSINGS) ×4
CABLE HIGH FREQUENCY MONO STRZ (ELECTRODE) ×2 IMPLANT
CHLORAPREP W/TINT 26 (MISCELLANEOUS) ×2 IMPLANT
CLIP APPLIE 5 13 M/L LIGAMAX5 (MISCELLANEOUS) IMPLANT
CLIP APPLIE ROT 10 11.4 M/L (STAPLE) IMPLANT
CLIP VESOLOCK MED LG 6/CT (CLIP) IMPLANT
COVER MAYO STAND STRL (DRAPES) IMPLANT
COVER SURGICAL LIGHT HANDLE (MISCELLANEOUS) ×2 IMPLANT
DECANTER SPIKE VIAL GLASS SM (MISCELLANEOUS) ×2 IMPLANT
DERMABOND ADVANCED (GAUZE/BANDAGES/DRESSINGS)
DERMABOND ADVANCED .7 DNX12 (GAUZE/BANDAGES/DRESSINGS) IMPLANT
DRAPE C-ARM 42X120 X-RAY (DRAPES) IMPLANT
DRSG OPSITE POSTOP 3X4 (GAUZE/BANDAGES/DRESSINGS) ×1 IMPLANT
DRSG TEGADERM 2-3/8X2-3/4 SM (GAUZE/BANDAGES/DRESSINGS) IMPLANT
DRSG TELFA 3X8 NADH (GAUZE/BANDAGES/DRESSINGS) ×2 IMPLANT
ELECT REM PT RETURN 15FT ADLT (MISCELLANEOUS) ×2 IMPLANT
GAUZE SPONGE 2X2 8PLY STRL LF (GAUZE/BANDAGES/DRESSINGS) IMPLANT
GLOVE SURG POLY ORTHO LF SZ7.5 (GLOVE) ×2 IMPLANT
GOWN STRL REUS W/TWL XL LVL3 (GOWN DISPOSABLE) ×6 IMPLANT
GRASPER SUT TROCAR 14GX15 (MISCELLANEOUS) IMPLANT
HEMOSTAT ARISTA ABSORB 3G PWDR (HEMOSTASIS) IMPLANT
HEMOSTAT SNOW SURGICEL 2X4 (HEMOSTASIS) ×2 IMPLANT
KIT BASIN OR (CUSTOM PROCEDURE TRAY) ×2 IMPLANT
KIT TURNOVER KIT A (KITS) ×2 IMPLANT
L-HOOK LAP DISP 36CM (ELECTROSURGICAL)
LHOOK LAP DISP 36CM (ELECTROSURGICAL) IMPLANT
PAD DRESSING TELFA 3X8 NADH (GAUZE/BANDAGES/DRESSINGS) IMPLANT
POUCH RETRIEVAL ECOSAC 10 (ENDOMECHANICALS) ×1 IMPLANT
POUCH RETRIEVAL ECOSAC 10MM (ENDOMECHANICALS) ×2
SCISSORS LAP 5X35 DISP (ENDOMECHANICALS) ×2 IMPLANT
SET CHOLANGIOGRAPH MIX (MISCELLANEOUS) IMPLANT
SET IRRIG TUBING LAPAROSCOPIC (IRRIGATION / IRRIGATOR) ×2 IMPLANT
SET TUBE SMOKE EVAC HIGH FLOW (TUBING) ×2 IMPLANT
SHEARS HARMONIC ACE PLUS 45CM (MISCELLANEOUS) ×1 IMPLANT
SLEEVE XCEL OPT CAN 5 100 (ENDOMECHANICALS) ×4 IMPLANT
SPONGE GAUZE 2X2 8PLY STRL LF (GAUZE/BANDAGES/DRESSINGS) ×1 IMPLANT
SPONGE GAUZE 2X2 STER 10/PKG (GAUZE/BANDAGES/DRESSINGS)
STRIP CLOSURE SKIN 1/2X4 (GAUZE/BANDAGES/DRESSINGS) IMPLANT
SUT MNCRL AB 4-0 PS2 18 (SUTURE) ×2 IMPLANT
SUT VIC AB 0 UR5 27 (SUTURE) IMPLANT
SUT VICRYL 0 TIES 12 18 (SUTURE) ×1 IMPLANT
SUT VICRYL 0 UR6 27IN ABS (SUTURE) IMPLANT
SYR 20ML LL LF (SYRINGE) ×1 IMPLANT
TAPE STRIPS DRAPE STRL (GAUZE/BANDAGES/DRESSINGS) ×1 IMPLANT
TOWEL OR 17X26 10 PK STRL BLUE (TOWEL DISPOSABLE) ×2 IMPLANT
TOWEL OR NON WOVEN STRL DISP B (DISPOSABLE) ×2 IMPLANT
TRAY LAPAROSCOPIC (CUSTOM PROCEDURE TRAY) ×2 IMPLANT
TROCAR BLADELESS OPT 5 100 (ENDOMECHANICALS) ×2 IMPLANT
TROCAR XCEL BLUNT TIP 100MML (ENDOMECHANICALS) ×1 IMPLANT
TROCAR XCEL NON-BLD 11X100MML (ENDOMECHANICALS) IMPLANT

## 2020-07-09 NOTE — Transfer of Care (Signed)
Immediate Anesthesia Transfer of Care Note  Patient: Maurice Fox  Procedure(s) Performed: Procedure(s): LAPAROSCOPIC CHOLECYSTECTOMY WITH INTRAOPERATIVE CHOLANGIOGRAM, (N/A) LAP LIVER BIOPSY (N/A) PRIMARY REPAIR SUPRA-UMBILICAL HERNIA (N/A)  Patient Location: PACU  Anesthesia Type:General  Level of Consciousness: Alert, Awake, Oriented  Airway & Oxygen Therapy: Patient Spontanous Breathing  Post-op Assessment: Report given to RN  Post vital signs: Reviewed and stable  Last Vitals:  Vitals:   07/09/20 0448 07/09/20 0923  BP: 135/65 (!) 157/76  Pulse: 81 89  Resp: 18 18  Temp: 36.7 C 37.2 C  SpO2: 21% 11%    Complications: No apparent anesthesia complications

## 2020-07-09 NOTE — Plan of Care (Signed)
  Problem: Coping: Goal: Level of anxiety will decrease Outcome: Progressing   Problem: Pain Managment: Goal: General experience of comfort will improve Outcome: Progressing   Problem: Safety: Goal: Ability to remain free from injury will improve Outcome: Progressing   

## 2020-07-09 NOTE — Interval H&P Note (Signed)
History and Physical Interval Note:  07/09/2020 9:44 AM  Maurice Fox  has presented today for surgery, with the diagnosis of Cholecystitis.  The various methods of treatment have been discussed with the patient and family. After consideration of risks, benefits and other options for treatment, the patient has consented to  Procedure(s): LAPAROSCOPIC CHOLECYSTECTOMY WITH INTRAOPERATIVE CHOLANGIOGRAM (N/A) as a surgical intervention.  The patient's history has been reviewed, patient examined, no change in status, stable for surgery.  I have reviewed the patient's chart and labs.  Questions were answered to the patient's satisfaction.    Leighton Ruff. Redmond Pulling, MD, FACS General, Bariatric, & Minimally Invasive Surgery [facility], [place]  Greer Pickerel

## 2020-07-09 NOTE — Anesthesia Procedure Notes (Signed)
Procedure Name: Intubation Date/Time: 07/09/2020 10:20 AM Performed by: Gerald Leitz, CRNA Pre-anesthesia Checklist: Patient identified, Patient being monitored, Timeout performed, Emergency Drugs available and Suction available Patient Re-evaluated:Patient Re-evaluated prior to induction Oxygen Delivery Method: Circle system utilized Preoxygenation: Pre-oxygenation with 100% oxygen Induction Type: IV induction Ventilation: Mask ventilation without difficulty Laryngoscope Size: Mac and 3 Grade View: Grade I Tube type: Oral Tube size: 7.5 mm Number of attempts: 1 Airway Equipment and Method: Stylet Placement Confirmation: ETT inserted through vocal cords under direct vision, positive ETCO2 and breath sounds checked- equal and bilateral Secured at: 22 cm Tube secured with: Tape Dental Injury: Teeth and Oropharynx as per pre-operative assessment

## 2020-07-09 NOTE — Discharge Instructions (Signed)
[facility]  Please arrive at least 30 min before your appointment to complete your check in paperwork.  If you are unable to arrive 30 min prior to your appointment time we may have to cancel or reschedule you. LAPAROSCOPIC SURGERY: POST OP INSTRUCTIONS Always review your discharge instruction sheet given to you by the facility where your surgery was performed. IF YOU HAVE DISABILITY OR FAMILY LEAVE FORMS, YOU MUST BRING THEM TO THE OFFICE FOR PROCESSING.   DO NOT GIVE THEM TO YOUR DOCTOR.  PAIN CONTROL  First take acetaminophen (Tylenol) AND/or ibuprofen (Advil) to control your pain after surgery.  Follow directions on package.  Taking acetaminophen (Tylenol) and/or ibuprofen (Advil) regularly after surgery will help to control your pain and lower the amount of prescription pain medication you may need.  You should not take more than 4,000 mg (4 grams) of acetaminophen (Tylenol) in 24 hours.  You should not take ibuprofen (Advil), aleve, motrin, naprosyn or other NSAIDS if you have a history of stomach ulcers or chronic kidney disease.  A prescription for pain medication may be given to you upon discharge.  Take your pain medication as prescribed, if you still have uncontrolled pain after taking acetaminophen (Tylenol) or ibuprofen (Advil). Use ice packs to help control pain. If you need a refill on your pain medication, please contact your pharmacy.  They will contact our office to request authorization. Prescriptions will not be filled after 5pm or on week-ends.  HOME MEDICATIONS Take your usually prescribed medications unless otherwise directed.  DIET You should follow a light diet the first few days after arrival home.  Be sure to include lots of fluids daily. Avoid fatty, fried foods.   CONSTIPATION It is common to experience some constipation after surgery and if you are taking pain medication.  Increasing fluid intake and taking a stool softener (such as Colace)  will usually help or prevent this problem from occurring.  A mild laxative (Milk of Magnesia or Miralax) should be taken according to package instructions if there are no bowel movements after 48 hours.  WOUND/INCISION CARE Most patients will experience some swelling and bruising in the area of the incisions.  Ice packs will help.  Swelling and bruising can take several days to resolve.  Unless discharge instructions indicate otherwise, follow guidelines below  STERI-STRIPS - you may remove your outer bandages 48 hours after surgery, and you may shower at that time.  You have steri-strips (small skin tapes) in place directly over the incision.  These strips should be left on the skin for 7-10 days.   DERMABOND/SKIN GLUE - you may shower in 24 hours.  The glue will flake off over the next 2-3 weeks. Any sutures or staples will be removed at the office during your follow-up visit.  ACTIVITIES You may resume regular (light) daily activities beginning the next day--such as daily self-care, walking, climbing stairs--gradually increasing activities as tolerated.  You may have sexual intercourse when it is comfortable.  Refrain from any heavy lifting or straining until approved by your doctor. You may drive when you are no longer taking prescription pain medication, you can comfortably wear a seatbelt, and you can safely maneuver your car and apply brakes.  FOLLOW-UP You should see your doctor in the office for a follow-up appointment approximately 2-3 weeks after your surgery.  You should have been given your post-op/follow-up appointment when your surgery was scheduled.  If you did not receive a post-op/follow-up appointment, make sure   that you call for this appointment within a day or two after you arrive home to insure a convenient appointment time.   WHEN TO CALL YOUR DOCTOR: Fever over 101.0 Inability to urinate Continued bleeding from incision. Increased pain, redness, or drainage from the  incision. Increasing abdominal pain  The clinic staff is available to answer your questions during regular business hours.  Please don't hesitate to call and ask to speak to one of the nurses for clinical concerns.  If you have a medical emergency, go to the nearest emergency room or call 911.  A surgeon from [facility] is always on call at the hospital. [address], [address], [place], [place]  [address] ? [address] [address], [place], [place]   [address] [phone] ? [phone] ? FAX [phone]  

## 2020-07-09 NOTE — Progress Notes (Signed)
PROGRESS NOTE    Maurice Fox  LEX:[id number] DOB: Dec 05, 1950 DOA: 07/07/2020 PCP: Fanny Bien, MD  Brief Narrative:  The patient is an obese 70 year old African-American male with a past medical history significant for but not limited to osteoarthritis, hyperlipidemia, hypertension, nocturia, OSA on CPAP, phimosis, type 2 diabetes mellitus, history of appendectomy as well as other comorbidities who states that he was recently diagnosed with cholelithiasis about 2 months ago but then since then has been experiencing increased frequency and intensity of right upper quadrant pain.  Patient states that he been having frequent constipation lately and yesterday he states that he had to strain the have a partial bowel movement.  He did not have any major discomfort afterwards and went to bed later in the evening.  Around 3 AM he woke up with the most intense right upper quadrant pain is overhead so far and he rated a 10 out of 10 with sharp and that radiated to his back.  It is associate with nausea but denies any emesis, diarrhea, melena or hematochezia.  He had no other GU symptoms other than nocturia.  Because of his symptoms he presented to the ED and received IV morphine and ondansetron as well as an IV hydromorphone.  Patient underwent further imaging and had a x-ray which showed shallow ulceration and mild right perihilar/basilar atelectasis.  CT of the abdomen pelvis was done with contrast and showed cholelithiasis without evidence of acute cholecystitis.  There is moderate volume of stool in the colon.  Right upper quadrant ultrasound shows chronic cholelithiasis without evidence of acute cholecystitis.  Patient was admitted by general surgery and [facility] was consulted given his elevated troponins and medical management.  Currently he is being admitted and treated for acute cholecystitis with symptomatic cholelithiasis and he has been placed on IV fluids and analgesics.  He started on IV antibiotics  and surgery is planning to take the patient for surgical intervention today. Overnight he had a mild temp of 100.4. He remains on Antibiotics    Assessment & Plan:   Principal Problem:   Acute cholecystitis Active Problems:   Hyperlipidemia   Type 2 diabetes mellitus (HCC)   OSA on CPAP   Hypertension   Elevated troponin  Acute on Chronic Calculos cholecystitis/symptomatic cholelithiasis -Continue IV fluids with NS but reduced rate to 75 mL/hr yesterday -MRI of the Abdomen on 05/05/20 showed "Small benign Bosniak category 1 and 2 renal cysts, largest in right kidney corresponding with lesion seen on recent CT. No evidence of renal neoplasm or  hydronephrosis. Cholelithiasis. No radiographic evidence of cholecystitis or biliary ductal dilatation." -CT Scan showed "No acute abdominopelvic findings. Cholelithiasis without evidence of acute cholecystitis.  Moderate volume of stool within the colon. Small fat containing supraumbilical hernia.  Indeterminate 1.8 cm left adrenal nodule. Partially visualized intramuscular lipoma within the left quadratus femoris muscle measuring at least 5.4 cm. Numerous tiny intra-articular calcifications within the right hip joint suggesting synovial chondromatosis." -Analgesics as needed with Ketorolac, Morphine and Acetaminophen -Antiemetics as needed with po/IV Ondansetron -Continue ceftriaxone 2 g q 24 hours and Metronidazole 500 mg IVPB every 8 hours. -Follow CBC and CMP. -Preop/postop care per surgery. He is s/p Laparoscopic Cholecystectomy with IOC, laparoscopic wedge biopsy of left lateral hepatic lobe, laparoscopic assisted repair of supraumbilical hernia  -IOC was Normal appearing -Liver Bx done and will need to follow     Liver Cysts vs Biliary Hamartomas -Seen on CT Scan of the Abd/Pelvis; Read was showing "Numerous small  primarily subcentimeter low-density lesions scattered throughout both hepatic lobes, possibly small cysts or biliary hamartomas.  Multiple stones again seen within the gallbladder lumen. No pericholecystic inflammatory changes by CT. No biliary dilatation." -Patient under went Wed Bx of Left Lateral Hepatic Lobe -Follow up Bx Results  GERD -C/w Pantoprazole 40 mg IV for now as he is NPO  BPH -C/w Tamsulosin 0.4 mg sq qHS  Hyperlipidemia -On Atorvastatin 10 mg p.o. daily.   Type 2 diabetes mellitus ([place]) -Was NPO But because Surgery is going to be tomorrow will be placed on a Diet; NPO this AM but further diet advancement per General Surgery  -Holding Synjardy. -Check hemoglobin A1c in the AM. -CBG monitoring every 6 hours; CBGs ranging from 70-149 -Continuing Resistant SSI q4h and will need to change to AC/HS   OSA on CPAP -Continue CPAP at bedtime.   Hypertension -On amlodipine 10 mg p.o. daily. -Continue to Monitor BP per Protocol and heart rate. -Last BP was 147/75 -If necessary will place on PRN Hydralazine   Elevated Troponin -Has known RBBB on EKG. -Troponin was Flat and went from 48 -> 46; Not indicative of ACS and likely demand ischemia  -Checked Echocardiogram and showed EF of 50-55% with No Regional Wall motion Abnormalities but did have Grade 1 DD -Currently Asymptomatic and has no Chest Pain   Obesity -Complicates overall prognosis and care -Estimated body mass index is 34.72 kg/m as calculated from the following:   Height as of 11/29/19: 5\' 10"  (1.778 m).   Weight as of 11/29/19: 109.8 kg. -Weight Loss and Dietary Counseling given    DVT prophylaxis: Enoxaparin 40 mg sq q24h Code Status: FULL CODE Family Communication: No family present at bedside  Disposition Plan: Pending Surgery and Further Surgical Clearance   Status is: Observation  The patient will require care spanning > 2 midnights and should be moved to inpatient because: Unsafe d/c plan, IV treatments appropriate due to intensity of illness or inability to take PO, and Inpatient level of care appropriate due to severity  of illness  Dispo: The patient is from: Home              Anticipated d/c is to: Home              Patient currently is not medically stable to d/c.   Difficult to place patient No   Consultants:  [facility] is Primary   Procedures:  ECHOCARDIOGRAM IMPRESSIONS     1. Left ventricular ejection fraction, by estimation, is 50 to 55%. The  left ventricle has low normal function. The left ventricle has no regional  wall motion abnormalities. There is moderate asymmetric left ventricular  hypertrophy of the basal-septal  segment. Left ventricular diastolic parameters are consistent with Grade I  diastolic dysfunction (impaired relaxation). Elevated left ventricular  end-diastolic pressure.   2. Right ventricular systolic function is normal. The right ventricular  size is normal.   3. The mitral valve is grossly normal. Trivial mitral valve  regurgitation.   4. The aortic valve is tricuspid. Aortic valve regurgitation is not  visualized.   5. Aortic dilatation noted. There is borderline dilatation of the aortic  root, measuring 38 mm.   6. The inferior vena cava is normal in size with greater than 50%  respiratory variability, suggesting right atrial pressure of 3 mmHg.   Comparison(s): No prior Echocardiogram.   FINDINGS   Left Ventricle: Left ventricular ejection fraction, by estimation, is 50  to 55%. The  left ventricle has low normal function. The left ventricle has  no regional wall motion abnormalities. The left ventricular internal  cavity size was normal in size.  There is moderate asymmetric left ventricular hypertrophy of the  basal-septal segment. Left ventricular diastolic parameters are consistent  with Grade I diastolic dysfunction (impaired relaxation). Elevated left  ventricular end-diastolic pressure.   Right Ventricle: The right ventricular size is normal. No increase in  right ventricular wall thickness. Right ventricular systolic function is   normal.   Left Atrium: Left atrial size was normal in size.   Right Atrium: Right atrial size was normal in size.   Pericardium: There is no evidence of pericardial effusion.   Mitral Valve: The mitral valve is grossly normal. Trivial mitral valve  regurgitation.   Tricuspid Valve: The tricuspid valve is grossly normal. Tricuspid valve  regurgitation is trivial.   Aortic Valve: The aortic valve is tricuspid. Aortic valve regurgitation is  not visualized.   Pulmonic Valve: The pulmonic valve was grossly normal. Pulmonic valve  regurgitation is not visualized.   Aorta: Aortic dilatation noted. There is borderline dilatation of the  aortic root, measuring 38 mm.   Venous: The inferior vena cava is normal in size with greater than 50%  respiratory variability, suggesting right atrial pressure of 3 mmHg.   IAS/Shunts: No atrial level shunt detected by color flow Doppler.      LEFT VENTRICLE  PLAX 2D  LVIDd:         4.70 cm      Diastology  LVIDs:         3.70 cm      LV e' medial:    4.87 cm/s  LV PW:         1.20 cm      LV E/e' medial:  16.3  LV IVS:        1.80 cm      LV e' lateral:   4.56 cm/s  LVOT diam:     2.40 cm      LV E/e' lateral: 17.4  LV SV:         79  LV SV Index:   35  LVOT Area:     4.52 cm     LV Volumes (MOD)  LV vol d, MOD A2C: 153.0 ml  LV vol d, MOD A4C: 201.0 ml  LV vol s, MOD A2C: 73.3 ml  LV vol s, MOD A4C: 99.1 ml  LV SV MOD A2C:     79.7 ml  LV SV MOD A4C:     201.0 ml  LV SV MOD BP:      90.3 ml   RIGHT VENTRICLE  RV S prime:     13.30 cm/s  TAPSE (M-mode): 1.7 cm   LEFT ATRIUM             Index  LA diam:        3.90 cm 1.72 cm/m  LA Vol (A2C):   39.7 ml 17.54 ml/m  LA Vol (A4C):   29.5 ml 13.03 ml/m  LA Biplane Vol: 34.8 ml 15.37 ml/m   AORTIC VALVE  LVOT Vmax:   93.55 cm/s  LVOT Vmean:  61.650 cm/s  LVOT VTI:    0.174 m     AORTA  Ao Root diam: 3.90 cm  Ao Asc diam:  3.70 cm   MITRAL VALVE  MV Area (PHT): 5.54 cm      SHUNTS  MV Decel Time: 137 msec  Systemic VTI:  0.17 m  MV E velocity: 79.40 cm/s   Systemic Diam: 2.40 cm  MV A velocity: 114.00 cm/s  MV E/A ratio:  0.70   Laparoscopic Cholecystectomy with IOC, laparoscopic wedge biopsy of left lateral hepatic lobe, laparoscopic assisted repair of supraumbilical hernia Procedure Note per Dr. Greer Pickerel on 07/09/20    Antimicrobials:  Anti-infectives (From admission, onward)    Start     Dose/Rate Route Frequency Ordered Stop   07/07/20 1800  cefTRIAXone (ROCEPHIN) 2 g in sodium chloride 0.9 % 100 mL IVPB        2 g 200 mL/hr over 30 Minutes Intravenous Every 24 hours 07/07/20 1721     07/07/20 1800  metroNIDAZOLE (FLAGYL) IVPB 500 mg        500 mg 100 mL/hr over 60 Minutes Intravenous Every 8 hours 07/07/20 1732     07/07/20 1700  piperacillin-tazobactam (ZOSYN) IVPB 3.375 g  Status:  Discontinued        3.375 g 12.5 mL/hr over 240 Minutes Intravenous Every 8 hours 07/07/20 1612 07/07/20 1732   07/07/20 1615  piperacillin-tazobactam (ZOSYN) IVPB 3.375 g  Status:  Discontinued        3.375 g 100 mL/hr over 30 Minutes Intravenous Every 8 hours 07/07/20 1606 07/07/20 1611        Subjective: Seen and examined at bedside after his surgery and he is feeling well.  Any nausea, vomiting.  Had some soreness.  No chest pain or lightheadedness or dizziness.  No other concerns or complaints at this time.  Objective: Vitals:   07/08/20 0540 07/08/20 1334 07/08/20 2127 07/09/20 0448  BP: (!) 147/88 (!) 160/86 (!) 151/85 135/65  Pulse: 93 89 87 81  Resp: 18 18 18 18   Temp: 98.4 F (36.9 C) (!) 97.5 F (36.4 C) (!) 100.4 F (38 C) 98 F (36.7 C)  TempSrc: Oral Oral Oral Oral  SpO2: 99% 99% 99% 99%    Intake/Output Summary (Last 24 hours) at 07/09/2020 0759 Last data filed at 07/09/2020 0600 Gross per 24 hour  Intake 3679.18 ml  Output 0 ml  Net 3679.18 ml    There were no vitals filed for this visit.  Examination:  Physical  Exam:  Constitutional: WN/WD obese AAM in NAD and appears calm and comfortable Eyes: Lids and conjunctivae normal, sclerae anicteric  ENMT: External Ears, Nose appear normal. Grossly normal hearing.  Neck: Appears normal, supple, no cervical masses, normal ROM, no appreciable thyromegaly; no JVD Respiratory: Diminished to auscultation bilaterally, no wheezing, rales, rhonchi or crackles. Normal respiratory effort and patient is not tachypenic. No accessory muscle use. Unlabored breathing  Cardiovascular: RRR, no murmurs / rubs / gallops. S1 and S2 auscultated. No extremity edema. Abdomen: Soft, milldy-tender, distended 2/2 body habitus. Bowel sounds positive. Has 4 laproscopic incisions noted GU: Deferred. Musculoskeletal: No clubbing / cyanosis of digits/nails. No joint deformity upper and lower extremities.  Skin: No rashes, lesions, ulcers on a limited skin evaluation No induration; Warm and dry.  Neurologic: CN 2-12 grossly intact with no focal deficits. Romberg sign and cerebellar reflexes not assessed.  Psychiatric: Normal judgment and insight. Alert and oriented x 3. Normal mood and appropriate affect.   Data Reviewed: I have personally reviewed following labs and imaging studies  CBC: Recent Labs  Lab 07/07/20 0811 07/08/20 0527 07/09/20 0344  WBC 5.8 6.5 6.4  NEUTROABS  --  4.3 4.0  HGB 16.3 15.8 14.7  HCT 49.4 48.8 44.9  MCV 87.9 88.1  87.5  PLT 240 242 211    Basic Metabolic Panel: Recent Labs  Lab 07/07/20 0811 07/07/20 1016 07/08/20 0527 07/09/20 0344  NA 138  --  138 138  K 6.1* 3.9 3.5 3.5  CL 106  --  104 106  CO2 24  --  26 23  GLUCOSE 115*  --  77 79  BUN 20  --  17 19  CREATININE 1.08  --  0.87 0.85  CALCIUM 9.1  --  9.0 8.8*  MG  --   --  2.0 2.0  PHOS  --   --  3.9 3.9    GFR: CrCl cannot be calculated (Unknown ideal weight.). Liver Function Tests: Recent Labs  Lab 07/07/20 0811 07/08/20 0527 07/09/20 0344  AST 60* 39 36  ALT 25 24 26    ALKPHOS 95 98 97  BILITOT 1.7* 0.7 0.7  PROT 7.7 7.7 6.9  ALBUMIN 3.9 3.8 3.4*    Recent Labs  Lab 07/07/20 0811  LIPASE 41    No results for input(s): AMMONIA in the last 168 hours. Coagulation Profile: No results for input(s): INR, PROTIME in the last 168 hours. Cardiac Enzymes: No results for input(s): CKTOTAL, CKMB, CKMBINDEX, TROPONINI in the last 168 hours. BNP (last 3 results) No results for input(s): PROBNP in the last 8760 hours. HbA1C: No results for input(s): HGBA1C in the last 72 hours. CBG: Recent Labs  Lab 07/08/20 1608 07/08/20 1934 07/08/20 2353 07/09/20 0445 07/09/20 0731  GLUCAP 162* 146* 126* 70 85    Lipid Profile: No results for input(s): CHOL, HDL, LDLCALC, TRIG, CHOLHDL, LDLDIRECT in the last 72 hours. Thyroid Function Tests: No results for input(s): TSH, T4TOTAL, FREET4, T3FREE, THYROIDAB in the last 72 hours. Anemia Panel: No results for input(s): VITAMINB12, FOLATE, FERRITIN, TIBC, IRON, RETICCTPCT in the last 72 hours. Sepsis Labs: No results for input(s): PROCALCITON, LATICACIDVEN in the last 168 hours.  Recent Results (from the past 240 hour(s))  Resp Panel by RT-PCR (Flu A&B, Covid) Nasopharyngeal Swab     Status: None   Collection Time: 07/07/20  8:45 AM   Specimen: Nasopharyngeal Swab; Nasopharyngeal(NP) swabs in vial transport medium  Result Value Ref Range Status   SARS Coronavirus 2 by RT PCR NEGATIVE NEGATIVE Final    Comment: (NOTE) SARS-CoV-2 target nucleic acids are NOT DETECTED.  The SARS-CoV-2 RNA is generally detectable in upper respiratory specimens during the acute phase of infection. The lowest concentration of SARS-CoV-2 viral copies this assay can detect is 138 copies/mL. A negative result does not preclude SARS-Cov-2 infection and should not be used as the sole basis for treatment or other patient management decisions. A negative result may occur with  improper specimen collection/handling, submission of  specimen other than nasopharyngeal swab, presence of viral mutation(s) within the areas targeted by this assay, and inadequate number of viral copies(<138 copies/mL). A negative result must be combined with clinical observations, patient history, and epidemiological information. The expected result is Negative.  Fact Sheet for Patients:  EntrepreneurPulse.com.au  Fact Sheet for Healthcare Providers:  IncredibleEmployment.be  This test is no t yet approved or cleared by the [place] FDA and  has been authorized for detection and/or diagnosis of SARS-CoV-2 by FDA under an Emergency Use Authorization (EUA). This EUA will remain  in effect (meaning this test can be used) for the duration of the COVID-19 declaration under Section 564(b)(1) of the Act, 21 U.S.C.section 360bbb-3(b)(1), unless the authorization is terminated  or revoked sooner.  Influenza A by PCR NEGATIVE NEGATIVE Final   Influenza B by PCR NEGATIVE NEGATIVE Final    Comment: (NOTE) The Xpert Xpress SARS-CoV-2/FLU/RSV plus assay is intended as an aid in the diagnosis of influenza from Nasopharyngeal swab specimens and should not be used as a sole basis for treatment. Nasal washings and aspirates are unacceptable for Xpert Xpress SARS-CoV-2/FLU/RSV testing.  Fact Sheet for Patients: EntrepreneurPulse.com.au  Fact Sheet for Healthcare Providers: IncredibleEmployment.be  This test is not yet approved or cleared by the [place] FDA and has been authorized for detection and/or diagnosis of SARS-CoV-2 by FDA under an Emergency Use Authorization (EUA). This EUA will remain in effect (meaning this test can be used) for the duration of the COVID-19 declaration under Section 564(b)(1) of the Act, 21 U.S.C. section 360bbb-3(b)(1), unless the authorization is terminated or revoked.  Performed at [facility], [place]  [address] [place], [place] [address]   Surgical pcr screen     Status: None   Collection Time: 07/07/20  8:06 PM   Specimen: Nasal Mucosa; Nasal Swab  Result Value Ref Range Status   MRSA, PCR NEGATIVE NEGATIVE Final   Staphylococcus aureus NEGATIVE NEGATIVE Final    Comment: (NOTE) The Xpert SA Assay (FDA approved for NASAL specimens in patients 62 years of age and older), is one component of a comprehensive surveillance program. It is not intended to diagnose infection nor to guide or monitor treatment. Performed at [facility], [place] [address], [place], [place] [address]      RN Pressure Injury Documentation:     Estimated body mass index is 34.72 kg/m as calculated from the following:   Height as of 11/29/19: 5\' 10"  (1.778 m).   Weight as of 11/29/19: 109.8 kg.  Malnutrition Type:   Malnutrition Characteristics:   Nutrition Interventions:   Radiology Studies: CT ABDOMEN PELVIS W CONTRAST  Result Date: 07/07/2020 CLINICAL DATA:  Upper abdominal pain for 1 month. History of gallstones EXAM: CT ABDOMEN AND PELVIS WITH CONTRAST TECHNIQUE: Multidetector CT imaging of the abdomen and pelvis was performed using the standard protocol following bolus administration of intravenous contrast. CONTRAST:  165mL OMNIPAQUE IOHEXOL 300 MG/ML  SOLN COMPARISON:  Ultrasound 07/07/2020, MRI 05/05/2020 FINDINGS: Lower chest: Mild dependent subsegmental atelectasis. Lung bases are otherwise clear. Patulous distal esophagus. Heart size within normal limits. Hepatobiliary: Numerous small primarily subcentimeter low-density lesions scattered throughout both hepatic lobes, possibly small cysts or biliary hamartomas. Multiple stones again seen within the gallbladder lumen. No pericholecystic inflammatory changes by CT. No biliary dilatation. Pancreas: Unremarkable. No pancreatic ductal dilatation or surrounding inflammatory changes. Spleen: Normal in size without focal abnormality.  Adrenals/Urinary Tract: Indeterminate density 1.8 cm left adrenal nodule. Unremarkable right adrenal gland. Redemonstrated 2.4 cm hyperdense right renal cyst, recently classified as a benign hyperdense cyst on MRI. Additional smaller cysts within the right kidney measuring up to 1.8 cm are unchanged. No left-sided renal lesion. No renal stone or hydronephrosis. Urinary bladder is unremarkable. Stomach/Bowel: Stomach is within normal limits. Appendix not definitively seen. Moderate volume of stool within the colon. No evidence of bowel wall thickening, distention, or inflammatory changes. Vascular/Lymphatic: Atherosclerosis throughout the aortoiliac axis without aneurysm. No abdominopelvic lymphadenopathy. Reproductive: Prostate is unremarkable. Other: No free fluid. No abdominopelvic fluid collection. No pneumoperitoneum. Small fat containing supraumbilical hernia. Musculoskeletal: No acute or significant osseous findings. Partially visualized intramuscular lipoma within the left quadratus femoris muscle measuring at least 5.4 x 2.7 cm. Numerous tiny intra-articular calcifications within the right  hip joint suggesting synovial chondromatosis. IMPRESSION: 1. No acute abdominopelvic findings. 2. Cholelithiasis without evidence of acute cholecystitis. 3. Moderate volume of stool within the colon. 4. Small fat containing supraumbilical hernia. 5. Indeterminate 1.8 cm left adrenal nodule. 6. Partially visualized intramuscular lipoma within the left quadratus femoris muscle measuring at least 5.4 cm. 7. Numerous tiny intra-articular calcifications within the right hip joint suggesting synovial chondromatosis. Aortic Atherosclerosis (ICD10-I70.0). Electronically Signed   By: Davina Poke D.O.   On: 07/07/2020 12:31   DG Chest Portable 1 View  Result Date: 07/07/2020 CLINICAL DATA:  Chest pain. Additional provided: Patient reports upper abdominal pain intermittently for 1 month since being diagnosed with gallstones.  Pain worse this morning. History of diabetes and hypertension. EXAM: PORTABLE CHEST 1 VIEW COMPARISON:  Prior chest CT 04/12/2020. FINDINGS: Please note portions of the lateral costophrenic angles are excluded from the field of view bilaterally. Shallow inspiration radiograph. Cardiomegaly. Mild right perihilar/right basilar atelectasis. No appreciable airspace consolidation. No evidence of pleural effusion or pneumothorax. No acute bony abnormality identified IMPRESSION: Please note portions of the lateral costophrenic angles are excluded from the field of view bilaterally. Cardiomegaly. Shallow inspiration radiograph with mild right perihilar/right basilar atelectasis. No appreciable airspace consolidation or pulmonary edema. Electronically Signed   By: Kellie Simmering DO   On: 07/07/2020 10:02   ECHOCARDIOGRAM COMPLETE  Result Date: 07/08/2020    ECHOCARDIOGRAM REPORT   Patient Name:   CONNERY SHIFFLER Date of Exam: 07/08/2020 Medical Rec #:  [id number]       Height:       70.0 in Accession #:    [id number]      Weight:       242.0 lb Date of Birth:  [birth year]       BSA:          2.263 m Patient Age:    28 years        BP:           147/88 mmHg Patient Gender: M               HR:           93 bpm. Exam Location:  Inpatient Procedure: 2D Echo, Cardiac Doppler, Color Doppler and Strain Analysis Indications:    Elevated Troponin                 Abnormal ECG R94.31  History:        Patient has no prior history of Echocardiogram examinations.                 Risk Factors:Hypertension, Dyslipidemia, Sleep Apnea and                 Diabetes. Cholelithiasis.  Sonographer:    Darlina Sicilian RDCS Referring Phys: [id number] DAVID MANUEL [place]  1. Left ventricular ejection fraction, by estimation, is 50 to 55%. The left ventricle has low normal function. The left ventricle has no regional wall motion abnormalities. There is moderate asymmetric left ventricular hypertrophy of the basal-septal segment. Left ventricular  diastolic parameters are consistent with Grade I diastolic dysfunction (impaired relaxation). Elevated left ventricular end-diastolic pressure.  2. Right ventricular systolic function is normal. The right ventricular size is normal.  3. The mitral valve is grossly normal. Trivial mitral valve regurgitation.  4. The aortic valve is tricuspid. Aortic valve regurgitation is not visualized.  5. Aortic dilatation noted. There is borderline dilatation of the aortic root, measuring 38 mm.  6.  The inferior vena cava is normal in size with greater than 50% respiratory variability, suggesting right atrial pressure of 3 mmHg. Comparison(s): No prior Echocardiogram. FINDINGS  Left Ventricle: Left ventricular ejection fraction, by estimation, is 50 to 55%. The left ventricle has low normal function. The left ventricle has no regional wall motion abnormalities. The left ventricular internal cavity size was normal in size. There is moderate asymmetric left ventricular hypertrophy of the basal-septal segment. Left ventricular diastolic parameters are consistent with Grade I diastolic dysfunction (impaired relaxation). Elevated left ventricular end-diastolic pressure. Right Ventricle: The right ventricular size is normal. No increase in right ventricular wall thickness. Right ventricular systolic function is normal. Left Atrium: Left atrial size was normal in size. Right Atrium: Right atrial size was normal in size. Pericardium: There is no evidence of pericardial effusion. Mitral Valve: The mitral valve is grossly normal. Trivial mitral valve regurgitation. Tricuspid Valve: The tricuspid valve is grossly normal. Tricuspid valve regurgitation is trivial. Aortic Valve: The aortic valve is tricuspid. Aortic valve regurgitation is not visualized. Pulmonic Valve: The pulmonic valve was grossly normal. Pulmonic valve regurgitation is not visualized. Aorta: Aortic dilatation noted. There is borderline dilatation of the aortic root,  measuring 38 mm. Venous: The inferior vena cava is normal in size with greater than 50% respiratory variability, suggesting right atrial pressure of 3 mmHg. IAS/Shunts: No atrial level shunt detected by color flow Doppler.  LEFT VENTRICLE PLAX 2D LVIDd:         4.70 cm      Diastology LVIDs:         3.70 cm      LV e' medial:    4.87 cm/s LV PW:         1.20 cm      LV E/e' medial:  16.3 LV IVS:        1.80 cm      LV e' lateral:   4.56 cm/s LVOT diam:     2.40 cm      LV E/e' lateral: 17.4 LV SV:         79 LV SV Index:   35 LVOT Area:     4.52 cm  LV Volumes (MOD) LV vol d, MOD A2C: 153.0 ml LV vol d, MOD A4C: 201.0 ml LV vol s, MOD A2C: 73.3 ml LV vol s, MOD A4C: 99.1 ml LV SV MOD A2C:     79.7 ml LV SV MOD A4C:     201.0 ml LV SV MOD BP:      90.3 ml RIGHT VENTRICLE RV S prime:     13.30 cm/s TAPSE (M-mode): 1.7 cm LEFT ATRIUM             Index LA diam:        3.90 cm 1.72 cm/m LA Vol (A2C):   39.7 ml 17.54 ml/m LA Vol (A4C):   29.5 ml 13.03 ml/m LA Biplane Vol: 34.8 ml 15.37 ml/m  AORTIC VALVE LVOT Vmax:   93.55 cm/s LVOT Vmean:  61.650 cm/s LVOT VTI:    0.174 m  AORTA Ao Root diam: 3.90 cm Ao Asc diam:  3.70 cm MITRAL VALVE MV Area (PHT): 5.54 cm     SHUNTS MV Decel Time: 137 msec     Systemic VTI:  0.17 m MV E velocity: 79.40 cm/s   Systemic Diam: 2.40 cm MV A velocity: 114.00 cm/s MV E/A ratio:  0.70 Lyman Bishop MD Electronically signed by Lyman Bishop MD Signature Date/Time: 07/08/2020/12:17:34 PM  Final    US Abdomen Limited RUQ (LIVER/GB)  Result Date: 07/07/2020 CLINICAL DATA:  70 year old male with right upper quadrant abdominal pain this morning. EXAM: ULTRASOUND ABDOMEN LIMITED RIGHT UPPER QUADRANT COMPARISON:  Abdomen MRI 05/05/2020. ultrasound 06/04/2020. FINDINGS: Gallbladder: Gallstone again noted, this measured up to 2.7 cm on the April MRI. Gallbladder wall thickness remains normal at 2 mm. No pericholecystic fluid. No sonographic Murphy sign elicited. Common bile duct: Diameter: 4  mm, normal. Liver: There was no discrete liver lesion on the April MRI without and with contrast, or the ultrasound last month. Therefore, the circumscribed anechoic appearing areas identified today (such as on image 21) are felt to be prominent hepatic veins. Portal vein is patent on color Doppler imaging with normal direction of blood flow towards the liver. Other: Negative visible right kidney. IMPRESSION: 1. Chronic cholelithiasis without evidence of acute cholecystitis. 2. Suspect increased size of the hepatic veins from the prior studies this year. Query volume overload, CHF. Electronically Signed   By: Genevie Ann M.D.   On: 07/07/2020 09:41    Scheduled Meds:  acetaminophen  1,000 mg Oral Q8H   amLODipine  5 mg Oral QHS   atorvastatin  10 mg Oral QHS   enoxaparin (LOVENOX) injection  40 mg Subcutaneous Q24H   fluticasone  2 spray Each Nare Daily   insulin aspart  0-20 Units Subcutaneous Q4H   pantoprazole (PROTONIX) IV  40 mg Intravenous QHS   polyvinyl alcohol  1 drop Both Eyes BID   tamsulosin  0.4 mg Oral QHS   Continuous Infusions:  sodium chloride 75 mL/hr at 07/08/20 2141   cefTRIAXone (ROCEPHIN)  IV 2 g (07/08/20 1832)   metronidazole 500 mg (07/09/20 0438)    LOS: 0 days   Kerney Elbe, DO [facility] PAGER is on AMION  If 7PM-7AM, please contact night-coverage [email or web address]

## 2020-07-09 NOTE — Anesthesia Preprocedure Evaluation (Signed)
Anesthesia Evaluation  Patient identified by MRN, date of birth, ID band Patient awake    Reviewed: Patient's Chart, lab work & pertinent test results  Airway Mallampati: III  TM Distance: >3 FB Neck ROM: Full    Dental  (+) Poor Dentition, Teeth Intact   Pulmonary sleep apnea and Continuous Positive Airway Pressure Ventilation , Current Smoker,    Pulmonary exam normal        Cardiovascular hypertension, Pt. on medications  Rhythm:Regular Rate:Normal     Neuro/Psych negative neurological ROS  negative psych ROS   GI/Hepatic negative GI ROS, Neg liver ROS,   Endo/Other  diabetes, Well Controlled, Oral Hypoglycemic Agents  Renal/GU negative Renal ROS   phimosis    Musculoskeletal  (+) Arthritis ,   Abdominal (+)  Abdomen: soft. Bowel sounds: normal.  Peds  Hematology negative hematology ROS (+)   Anesthesia Other Findings   Reproductive/Obstetrics                             Anesthesia Physical  Anesthesia Plan  ASA: 3  Anesthesia Plan: General   Post-op Pain Management:    Induction: Intravenous  PONV Risk Score and Plan: 1 and Ondansetron, Dexamethasone and Treatment may vary due to age or medical condition  Airway Management Planned: Oral ETT and LMA  Additional Equipment: None  Intra-op Plan:   Post-operative Plan: Extubation in OR  Informed Consent: I have reviewed the patients History and Physical, chart, labs and discussed the procedure including the risks, benefits and alternatives for the proposed anesthesia with the patient or authorized representative who has indicated his/her understanding and acceptance.     Dental advisory given  Plan Discussed with: CRNA  Anesthesia Plan Comments:         Anesthesia Quick Evaluation

## 2020-07-09 NOTE — Anesthesia Postprocedure Evaluation (Signed)
Anesthesia Post Note  Patient: Maurice Fox  Procedure(s) Performed: LAPAROSCOPIC CHOLECYSTECTOMY WITH INTRAOPERATIVE CHOLANGIOGRAM, LAP LIVER BIOPSY PRIMARY REPAIR SUPRA-UMBILICAL HERNIA     Patient location during evaluation: PACU Anesthesia Type: General Level of consciousness: awake and alert Pain management: pain level controlled Vital Signs Assessment: post-procedure vital signs reviewed and stable Respiratory status: spontaneous breathing, nonlabored ventilation, respiratory function stable and patient connected to nasal cannula oxygen Cardiovascular status: blood pressure returned to baseline and stable Postop Assessment: no apparent nausea or vomiting Anesthetic complications: no   No notable events documented.  Last Vitals:  Vitals:   07/09/20 1757 07/09/20 2105  BP: (!) 164/81 (!) 164/81  Pulse: 88 93  Resp:  17  Temp: 36.8 C 37.5 C  SpO2: 97% 97%    Last Pain:  Vitals:   07/09/20 2105  TempSrc: Oral  PainSc:                  Korrie Hofbauer

## 2020-07-09 NOTE — Op Note (Signed)
Maurice Fox [id number] 05/13/1950 07/09/2020  Laparoscopic Cholecystectomy with IOC, laparoscopic wedge biopsy of left lateral hepatic lobe, laparoscopic assisted repair of supraumbilical hernia Procedure Note  Indications: This patient presents with symptomatic gallbladder disease and will undergo laparoscopic cholecystectomy.  He had been seen as an outpatient by one of my partners and scheduled for laparoscopic cholecystectomy in July but he presented to the emergency room over the weekend with a severe episode that was not getting better.  He had failed outpatient management so he was admitted and started on IV antibiotics.  Patient had a prior CT scan and MRI of his abdomen.  The CT scan demonstrated multiple bilateral lesions of the liver consistent with either benign cyst or hamartomas.  Pre-operative Diagnosis: Calculus of gallbladder with acute cholecystitis, without mention of obstruction  Post-operative Diagnosis: acute and chronic calculus cholecystitis, multiple bilateral liver lesions, supraumbilical hernia  Surgeon: Greer Pickerel MD FACS  Assistants: Judyann Munson, RNFA  Anesthesia: General endotracheal anesthesia  Procedure Details  The patient was seen again in the Holding Room. The risks, benefits, complications, treatment options, and expected outcomes were discussed with the patient. The possibilities of reaction to medication, pulmonary aspiration, perforation of viscus, bleeding, recurrent infection, finding a normal gallbladder, the need for additional procedures, failure to diagnose a condition, the possible need to convert to an open procedure, and creating a complication requiring transfusion or operation were discussed with the patient. The likelihood of improving the patient's symptoms with return to their baseline status is good.  The patient and/or family concurred with the proposed plan, giving informed consent. The site of surgery properly noted. The patient was  taken to Operating Room, identified as Maurice Fox and the procedure verified as Laparoscopic Cholecystectomy with Intraoperative Cholangiogram. A Time Out was held and the above information confirmed. Antibiotic prophylaxis was administered.   Prior to the induction of general anesthesia, antibiotic prophylaxis was administered. General endotracheal anesthesia was then administered and tolerated well. After the induction, the abdomen was prepped with Chloraprep and draped in the sterile fashion. The patient was positioned in the supine position.  He had a small umbilical hernia so therefore I decided to make an incision above the umbilicus supraumbilical position.  Local anesthetic agent was injected into the skin above the umbilicus and an incision made. We dissected down to the abdominal fascia with blunt dissection.  He was also found to have a supraumbilical hernia as well.  Contain fat.  This point I decided to Optiview in the left upper quadrant.  A small incision was made near the midline to the left.  Then using a 0 degree 5 mm laparoscope I advanced it through all layers of the abdominal wall and entered the abdominal cavity.  Pneumoperitoneum was smoothly established without any change in patient vital signs.  The laparoscope was advanced and the abdominal cavity was surveilled.  He had the known small umbilical hernia but also the supraumbilical hernia.  At this point I was able to place my [facility] trocar through this and placed a sutures on the fascia to anchor the [place] trocar.  The camera was switched to the [facility] trocar location.  The liver was visualized.  The patient had numerous bilateral lesions.  They were somewhat atypical in appearance.  2 of my partners came into the operating room and also agreed therefore I decided that I would do a liver biopsy at the conclusion of the procedure.   Two 5-mm ports were placed in the right  upper quadrant. All skin incisions were infiltrated with a  local anesthetic agent before making the incision and placing the trocars.   We positioned the patient in reverse Trendelenburg, tilted slightly to the patient's left.  The gallbladder was intrahepatic.  Very long gallbladder.  The gallbladder was identified, the fundus grasped and retracted cephalad. Adhesions were lysed bluntly and with the electrocautery where indicated, taking care not to injure any adjacent organs or viscus. The infundibulum was grasped and retracted laterally, exposing the peritoneum overlying the triangle of Calot. This was then divided and exposed in a blunt fashion. A large critical view of the cystic duct and cystic artery was obtained.  He had both an anterior and posterior branch of the cystic artery going into the gallbladder.  There were no other structures entering the gallbladder.  The cystic duct was clearly identified and bluntly dissected circumferentially. The cystic duct was ligated with a clip distally.   An incision was made in the cystic duct and the [facility] cholangiogram catheter introduced. The catheter was secured using a clip. A cholangiogram was then obtained which showed good visualization of the distal and proximal biliary tree with no sign of filling defects or obstruction.  There was initially what appeared to be an air bubble in the common hepatic duct so another run of the cholangiogram was performed which demonstrated no air bubbles.  Contrast flowed easily into the duodenum. The catheter was then removed.   The cystic duct was then ligated with clips and divided. The cystic artery both an anterior and posterior branch which had been identified & dissected free were ligated with clips and divided as well.   The gallbladder was dissected from the liver bed in retrograde fashion with the electrocautery. The gallbladder was removed and placed in an Ecco sac.  The gallbladder and Ecco sac were then removed through the umbilical port site. The liver bed was  irrigated and inspected. Hemostasis was achieved with the electrocautery. Copious irrigation was utilized and was repeatedly aspirated until clear.   At this point I decided to do a wedge biopsy of the left lateral hepatic lobe.  It was and sized with harmonic scalpel in a serial fashion for about a 1 and half centimeter section of tissue.  The edges were cauterized with high cautery.  A piece of surgical snow was placed in the gallbladder fossa and along where the liver biopsy wedge was performed.  He had omentum adhesed from the falciform down to the umbilicus to the anterior abdominal wall this was taken down with harmonic scalpel.  This was done in order to facilitate closure of the supraumbilical fascial defect.  In doing so I found another epigastric hernia and reduced the fat from that.  Several interrupted interrupted 0 Vicryl sutures were placed in the supraumbilical & epigastric fascia location.  I then placed 2 additional interrupted 0 Vicryl's using the PMI suture passer with laparoscopic assistance.  We again inspected the right upper quadrant for hemostasis.  The umbilical closure was inspected and there was no air leak and nothing trapped within the closure. Pneumoperitoneum was released as we removed the trocars.  I cleaned up the skin along the supraumbilical incision with a fresh blade.  The RNFA close the skin.  4-0 Monocryl was used to close the skin.    steri-strips, and clean dressings were applied. The patient was then extubated and brought to the recovery room in stable condition. Instrument, sponge, and needle counts were correct at closure  and at the conclusion of the case.   Findings: Chronic cholecystitis with Cholelithiasis Normal IOC Abnormal appearing bilateral liver lesions biopsy taken  Estimated Blood Loss: Minimal         Drains: none         Specimens: Gallbladder           Complications: None; patient tolerated the procedure well.         Disposition: PACU -  hemodynamically stable.         Condition: stable  Maurice Ruff. Redmond Pulling, MD, FACS General, Bariatric, & Minimally Invasive Surgery [facility] Surgery, [place]

## 2020-07-10 ENCOUNTER — Encounter (HOSPITAL_COMMUNITY): Payer: Self-pay | Admitting: General Surgery

## 2020-07-10 DIAGNOSIS — I10 Essential (primary) hypertension: Secondary | ICD-10-CM | POA: Diagnosis not present

## 2020-07-10 DIAGNOSIS — K812 Acute cholecystitis with chronic cholecystitis: Secondary | ICD-10-CM

## 2020-07-10 DIAGNOSIS — R778 Other specified abnormalities of plasma proteins: Secondary | ICD-10-CM | POA: Diagnosis not present

## 2020-07-10 DIAGNOSIS — E785 Hyperlipidemia, unspecified: Secondary | ICD-10-CM | POA: Diagnosis not present

## 2020-07-10 DIAGNOSIS — G4733 Obstructive sleep apnea (adult) (pediatric): Secondary | ICD-10-CM | POA: Diagnosis not present

## 2020-07-10 DIAGNOSIS — Z9989 Dependence on other enabling machines and devices: Secondary | ICD-10-CM | POA: Diagnosis not present

## 2020-07-10 DIAGNOSIS — E1165 Type 2 diabetes mellitus with hyperglycemia: Secondary | ICD-10-CM | POA: Diagnosis not present

## 2020-07-10 DIAGNOSIS — K810 Acute cholecystitis: Secondary | ICD-10-CM | POA: Diagnosis not present

## 2020-07-10 LAB — COMPREHENSIVE METABOLIC PANEL
ALT: 63 U/L — ABNORMAL HIGH (ref 0–44)
AST: 85 U/L — ABNORMAL HIGH (ref 15–41)
Albumin: 3.6 g/dL (ref 3.5–5.0)
Alkaline Phosphatase: 97 U/L (ref 38–126)
Anion gap: 10 (ref 5–15)
BUN: 15 mg/dL (ref 8–23)
CO2: 21 mmol/L — ABNORMAL LOW (ref 22–32)
Calcium: 9.1 mg/dL (ref 8.9–10.3)
Chloride: 108 mmol/L (ref 98–111)
Creatinine, Ser: 0.87 mg/dL (ref 0.61–1.24)
GFR, Estimated: >60 mL/min (ref >60)
Glucose, Bld: 105 mg/dL — ABNORMAL HIGH (ref 70–99)
Potassium: 4.1 mmol/L (ref 3.5–5.1)
Sodium: 139 mmol/L (ref 135–145)
Total Bilirubin: 0.6 mg/dL (ref 0.3–1.2)
Total Protein: 7.4 g/dL (ref 6.5–8.1)

## 2020-07-10 LAB — CBC
HCT: 47.1 % (ref 39.0–52.0)
Hemoglobin: 15.5 g/dL (ref 13.0–17.0)
MCH: 29 pg (ref 26.0–34.0)
MCHC: 32.9 g/dL (ref 30.0–36.0)
MCV: 88.2 fL (ref 80.0–100.0)
Platelets: 252 10*3/uL (ref 150–400)
RBC: 5.34 MIL/uL (ref 4.22–5.81)
RDW: 13.3 % (ref 11.5–15.5)
WBC: 8 10*3/uL (ref 4.0–10.5)
nRBC: 0 % (ref 0.0–0.2)

## 2020-07-10 LAB — GLUCOSE, CAPILLARY
Glucose-Capillary: 100 mg/dL — ABNORMAL HIGH (ref 70–99)
Glucose-Capillary: 117 mg/dL — ABNORMAL HIGH (ref 70–99)
Glucose-Capillary: 119 mg/dL — ABNORMAL HIGH (ref 70–99)
Glucose-Capillary: 136 mg/dL — ABNORMAL HIGH (ref 70–99)

## 2020-07-10 MED ORDER — ATORVASTATIN CALCIUM 10 MG PO TABS: 10.0000 mg | ORAL_TABLET | Freq: Every day | ORAL | Status: DC

## 2020-07-10 MED ORDER — OXYCODONE HCL 5 MG PO TABS: 5.0000 mg | ORAL_TABLET | Freq: Four times a day (QID) | ORAL | 0 refills | Status: DC | PRN

## 2020-07-10 NOTE — Progress Notes (Signed)
PROGRESS NOTE    Maurice Fox  QVZ:[id number] DOB: 21-Mar-1950 DOA: 07/07/2020 PCP: Fanny Bien, MD  Brief Narrative:  The patient is an obese 70 year old African-American male with a past medical history significant for but not limited to osteoarthritis, hyperlipidemia, hypertension, nocturia, OSA on CPAP, phimosis, type 2 diabetes mellitus, history of appendectomy as well as other comorbidities who states that he was recently diagnosed with cholelithiasis about 2 months ago but then since then has been experiencing increased frequency and intensity of right upper quadrant pain.  Patient states that he been having frequent constipation lately and yesterday he states that he had to strain the have a partial bowel movement.  He did not have any major discomfort afterwards and went to bed later in the evening.  Around 3 AM he woke up with the most intense right upper quadrant pain is overhead so far and he rated a 10 out of 10 with sharp and that radiated to his back.  It is associate with nausea but denies any emesis, diarrhea, melena or hematochezia.  He had no other GU symptoms other than nocturia.  Because of his symptoms he presented to the ED and received IV morphine and ondansetron as well as an IV hydromorphone.  Patient underwent further imaging and had a x-ray which showed shallow ulceration and mild right perihilar/basilar atelectasis.  CT of the abdomen pelvis was done with contrast and showed cholelithiasis without evidence of acute cholecystitis.  There is moderate volume of stool in the colon.  Right upper quadrant ultrasound shows chronic cholelithiasis without evidence of acute cholecystitis.  Patient was admitted by general surgery and [facility] was consulted given his elevated troponins and medical management.  Currently he is being admitted and treated for acute cholecystitis with symptomatic cholelithiasis and he has been placed on IV fluids and analgesics.  He started on IV antibiotics  and surgery is planning to take the patient for surgical intervention today. The night before last he had a mild temp of 100.4. He remains on Antibiotics and is improved after his surgery. He is stable from a Medical perspective to D/C Home and follow up with his PCP and have repeat bloodwork done within 1-week. He will need to be followed and have Primary or General Surgery discuss liver bx findings with him.    Assessment & Plan:   Principal Problem:   Acute cholecystitis Active Problems:   Hyperlipidemia   Type 2 diabetes mellitus (HCC)   OSA on CPAP   Hypertension   Elevated troponin  Acute on Chronic Calculos cholecystitis/symptomatic cholelithiasis -Continue IV fluids with NS but reduced rate to 75 mL/hr yesterday -MRI of the Abdomen on 05/05/20 showed "Small benign Bosniak category 1 and 2 renal cysts, largest in right kidney corresponding with lesion seen on recent CT. No evidence of renal neoplasm or  hydronephrosis. Cholelithiasis. No radiographic evidence of cholecystitis or biliary ductal dilatation." -CT Scan showed "No acute abdominopelvic findings. Cholelithiasis without evidence of acute cholecystitis.  Moderate volume of stool within the colon. Small fat containing supraumbilical hernia.  Indeterminate 1.8 cm left adrenal nodule. Partially visualized intramuscular lipoma within the left quadratus femoris muscle measuring at least 5.4 cm. Numerous tiny intra-articular calcifications within the right hip joint suggesting synovial chondromatosis." -Analgesics as needed with Ketorolac, Morphine and Acetaminophen -Antiemetics as needed with po/IV Ondansetron -Continue ceftriaxone 2 g q 24 hours and Metronidazole 500 mg IVPB every 8 hours. -Follow CBC and CMP. -Preop/postop care per surgery. He is s/p Laparoscopic Cholecystectomy  with IOC, laparoscopic wedge biopsy of left lateral hepatic lobe, laparoscopic assisted repair of supraumbilical hernia  -IOC was Normal appearing -Liver  Bx done and will need to follow     Liver Cysts vs Biliary Hamartomas -Seen on CT Scan of the Abd/Pelvis; Read was showing "Numerous small primarily subcentimeter low-density lesions scattered throughout both hepatic lobes, possibly small cysts or biliary hamartomas. Multiple stones again seen within the gallbladder lumen. No pericholecystic inflammatory changes by CT. No biliary dilatation." -Patient under went Wed Bx of Left Lateral Hepatic Lobe -Follow up Bx Results  GERD -C/w Pantoprazole 40 mg IV for now as he is NPO and change back to PO   BPH -C/w Tamsulosin 0.4 mg sq qHS  Hyperlipidemia -On Atorvastatin 10 mg p.o. daily but recommending holding for a week until LFTs improve and follow with PCP to recheck  Abnormal LFTs, mild  -Mild. In the setting of Surgery -Patient's AST went from 36 -> 85 -Patient's ALT went from 26 -> 63 -Continue to Monitor and Trend and Repeat CMP within 1 week. Hold Statin until re-evaluated and LFTs normalize    Type 2 diabetes mellitus ([place]) -Was NPO But because Surgery is going to be tomorrow will be placed on a Diet; NPO this AM but further diet advancement per General Surgery  -Holding Synjardy. -Check hemoglobin A1c in the AM. -CBG monitoring every 6 hours; CBGs ranging from 100-219 -Continuing Resistant SSI q4h and will need to change to AC/HS while hospitalized but can be Discharged on Home Med    OSA on CPAP -Continue CPAP at bedtime.   Hypertension -On Amlodipine 10 mg p.o. daily and an continue at D/C  -Continue to Monitor BP per Protocol and heart rate. -Last BP was 134/57 -If necessary will place on PRN Hydralazine   Elevated Troponin -Has known RBBB on EKG. -Troponin was Flat and went from 48 -> 46; Not indicative of ACS and likely demand ischemia  -Checked Echocardiogram and showed EF of 50-55% with No Regional Wall motion Abnormalities but did have Grade 1 DD -Currently Asymptomatic and has no Chest Pain    Obesity -Complicates overall prognosis and care -Estimated body mass index is 34.72 kg/m as calculated from the following:   Height as of 11/29/19: 5\' 10"  (1.778 m).   Weight as of 11/29/19: 109.8 kg. -Weight Loss and Dietary Counseling given   DVT prophylaxis: Enoxaparin 40 mg sq q24h Code Status: FULL CODE Family Communication: Discussed with wife at bedside Disposition Plan: Anticipating D/C home   Status is: Observation  The patient will require care spanning > 2 midnights and should be moved to inpatient because: Unsafe d/c plan, IV treatments appropriate due to intensity of illness or inability to take PO, and Inpatient level of care appropriate due to severity of illness  Dispo: The patient is from: Home              Anticipated d/c is to: Home              Patient currently is not medically stable to d/c.   Difficult to place patient No   Consultants:  [facility] is Primary   Procedures:  ECHOCARDIOGRAM IMPRESSIONS     1. Left ventricular ejection fraction, by estimation, is 50 to 55%. The  left ventricle has low normal function. The left ventricle has no regional  wall motion abnormalities. There is moderate asymmetric left ventricular  hypertrophy of the basal-septal  segment. Left ventricular diastolic parameters are consistent with  Grade I  diastolic dysfunction (impaired relaxation). Elevated left ventricular  end-diastolic pressure.   2. Right ventricular systolic function is normal. The right ventricular  size is normal.   3. The mitral valve is grossly normal. Trivial mitral valve  regurgitation.   4. The aortic valve is tricuspid. Aortic valve regurgitation is not  visualized.   5. Aortic dilatation noted. There is borderline dilatation of the aortic  root, measuring 38 mm.   6. The inferior vena cava is normal in size with greater than 50%  respiratory variability, suggesting right atrial pressure of 3 mmHg.   Comparison(s): No prior  Echocardiogram.   FINDINGS   Left Ventricle: Left ventricular ejection fraction, by estimation, is 50  to 55%. The left ventricle has low normal function. The left ventricle has  no regional wall motion abnormalities. The left ventricular internal  cavity size was normal in size.  There is moderate asymmetric left ventricular hypertrophy of the  basal-septal segment. Left ventricular diastolic parameters are consistent  with Grade I diastolic dysfunction (impaired relaxation). Elevated left  ventricular end-diastolic pressure.   Right Ventricle: The right ventricular size is normal. No increase in  right ventricular wall thickness. Right ventricular systolic function is  normal.   Left Atrium: Left atrial size was normal in size.   Right Atrium: Right atrial size was normal in size.   Pericardium: There is no evidence of pericardial effusion.   Mitral Valve: The mitral valve is grossly normal. Trivial mitral valve  regurgitation.   Tricuspid Valve: The tricuspid valve is grossly normal. Tricuspid valve  regurgitation is trivial.   Aortic Valve: The aortic valve is tricuspid. Aortic valve regurgitation is  not visualized.   Pulmonic Valve: The pulmonic valve was grossly normal. Pulmonic valve  regurgitation is not visualized.   Aorta: Aortic dilatation noted. There is borderline dilatation of the  aortic root, measuring 38 mm.   Venous: The inferior vena cava is normal in size with greater than 50%  respiratory variability, suggesting right atrial pressure of 3 mmHg.   IAS/Shunts: No atrial level shunt detected by color flow Doppler.      LEFT VENTRICLE  PLAX 2D  LVIDd:         4.70 cm      Diastology  LVIDs:         3.70 cm      LV e' medial:    4.87 cm/s  LV PW:         1.20 cm      LV E/e' medial:  16.3  LV IVS:        1.80 cm      LV e' lateral:   4.56 cm/s  LVOT diam:     2.40 cm      LV E/e' lateral: 17.4  LV SV:         79  LV SV Index:   35  LVOT Area:      4.52 cm     LV Volumes (MOD)  LV vol d, MOD A2C: 153.0 ml  LV vol d, MOD A4C: 201.0 ml  LV vol s, MOD A2C: 73.3 ml  LV vol s, MOD A4C: 99.1 ml  LV SV MOD A2C:     79.7 ml  LV SV MOD A4C:     201.0 ml  LV SV MOD BP:      90.3 ml   RIGHT VENTRICLE  RV S prime:     13.30 cm/s  TAPSE (M-mode):  1.7 cm   LEFT ATRIUM             Index  LA diam:        3.90 cm 1.72 cm/m  LA Vol (A2C):   39.7 ml 17.54 ml/m  LA Vol (A4C):   29.5 ml 13.03 ml/m  LA Biplane Vol: 34.8 ml 15.37 ml/m   AORTIC VALVE  LVOT Vmax:   93.55 cm/s  LVOT Vmean:  61.650 cm/s  LVOT VTI:    0.174 m     AORTA  Ao Root diam: 3.90 cm  Ao Asc diam:  3.70 cm   MITRAL VALVE  MV Area (PHT): 5.54 cm     SHUNTS  MV Decel Time: 137 msec     Systemic VTI:  0.17 m  MV E velocity: 79.40 cm/s   Systemic Diam: 2.40 cm  MV A velocity: 114.00 cm/s  MV E/A ratio:  0.70   Laparoscopic Cholecystectomy with IOC, laparoscopic wedge biopsy of left lateral hepatic lobe, laparoscopic assisted repair of supraumbilical hernia Procedure Note per Dr. Greer Pickerel on 07/09/20    Antimicrobials:  Anti-infectives (From admission, onward)    Start     Dose/Rate Route Frequency Ordered Stop   07/09/20 1000  ceFAZolin (ANCEF) IVPB 2g/100 mL premix  Status:  Discontinued        2 g 200 mL/hr over 30 Minutes Intravenous On call to O.R. 07/09/20 0957 07/09/20 1002   07/07/20 1800  cefTRIAXone (ROCEPHIN) 2 g in sodium chloride 0.9 % 100 mL IVPB        2 g 200 mL/hr over 30 Minutes Intravenous Every 24 hours 07/07/20 1721     07/07/20 1800  metroNIDAZOLE (FLAGYL) IVPB 500 mg        500 mg 100 mL/hr over 60 Minutes Intravenous Every 8 hours 07/07/20 1732     07/07/20 1700  piperacillin-tazobactam (ZOSYN) IVPB 3.375 g  Status:  Discontinued        3.375 g 12.5 mL/hr over 240 Minutes Intravenous Every 8 hours 07/07/20 1612 07/07/20 1732   07/07/20 1615  piperacillin-tazobactam (ZOSYN) IVPB 3.375 g  Status:  Discontinued        3.375 g 100  mL/hr over 30 Minutes Intravenous Every 8 hours 07/07/20 1606 07/07/20 1611        Subjective: Seen and examined at bedside and doing well. Awaiting to eat his breakfast. No nausea or vomiting. Denies any other concerns or complaints at this time and is ready to go home.   Objective: Vitals:   07/09/20 1757 07/09/20 2105 07/10/20 0118 07/10/20 0544  BP: (!) 164/81 (!) 164/81 (!) 156/79 (!) 134/57  Pulse: 88 93 87 82  Resp:  17 20 17   Temp: 98.2 F (36.8 C) 99.5 F (37.5 C) 98.7 F (37.1 C) 98 F (36.7 C)  TempSrc: Oral Oral Oral Oral  SpO2: 97% 97% 100% 96%    Intake/Output Summary (Last 24 hours) at 07/10/2020 0855 Last data filed at 07/10/2020 0600 Gross per 24 hour  Intake 2924.62 ml  Output 15 ml  Net 2909.62 ml    There were no vitals filed for this visit.  Examination: Physical Exam:  Constitutional: WN/WD obese AAM in NAD and appears calm and comfortable Eyes: Lids and conjunctivae normal, sclerae anicteric  ENMT: External Ears, Nose appear normal. Grossly normal hearing. Neck: Appears normal, supple, no cervical masses, normal ROM, no appreciable thyromegaly; no JVD Respiratory: Diminished to auscultation bilaterally, no wheezing, rales, rhonchi or crackles. Normal respiratory  effort and patient is not tachypenic. No accessory muscle use. Unlabored breathing Cardiovascular: RRR, no murmurs / rubs / gallops. S1 and S2 auscultated. No extremity edema.  Abdomen: Soft, non-tender, Distended 2/2 to body habitus. Bowel sounds positive.  GU: Deferred. Musculoskeletal: No clubbing / cyanosis of digits/nails. No joint deformity upper and lower extremities.  Skin: No rashes, lesions, ulcers on a limited skin evaluation. No induration; Warm and dry.  Neurologic: CN 2-12 grossly intact with no focal deficits. Romberg sign and cerebellar reflexes not assessed.  Psychiatric: Normal judgment and insight. Alert and oriented x 3. Normal mood and appropriate affect.   Data  Reviewed: I have personally reviewed following labs and imaging studies  CBC: Recent Labs  Lab 07/07/20 0811 07/08/20 0527 07/09/20 0344 07/10/20 0333  WBC 5.8 6.5 6.4 8.0  NEUTROABS  --  4.3 4.0  --   HGB 16.3 15.8 14.7 15.5  HCT 49.4 48.8 44.9 47.1  MCV 87.9 88.1 87.5 88.2  PLT 240 242 214 761    Basic Metabolic Panel: Recent Labs  Lab 07/07/20 0811 07/07/20 1016 07/08/20 0527 07/09/20 0344 07/10/20 0333  NA 138  --  138 138 139  K 6.1* 3.9 3.5 3.5 4.1  CL 106  --  104 106 108  CO2 24  --  26 23 21*  GLUCOSE 115*  --  77 79 105*  BUN 20  --  17 19 15   CREATININE 1.08  --  0.87 0.85 0.87  CALCIUM 9.1  --  9.0 8.8* 9.1  MG  --   --  2.0 2.0  --   PHOS  --   --  3.9 3.9  --     GFR: CrCl cannot be calculated (Unknown ideal weight.). Liver Function Tests: Recent Labs  Lab 07/07/20 0811 07/08/20 0527 07/09/20 0344 07/10/20 0333  AST 60* 39 36 85*  ALT 25 24 26  63*  ALKPHOS 95 98 97 97  BILITOT 1.7* 0.7 0.7 0.6  PROT 7.7 7.7 6.9 7.4  ALBUMIN 3.9 3.8 3.4* 3.6    Recent Labs  Lab 07/07/20 0811  LIPASE 41    No results for input(s): AMMONIA in the last 168 hours. Coagulation Profile: No results for input(s): INR, PROTIME in the last 168 hours. Cardiac Enzymes: No results for input(s): CKTOTAL, CKMB, CKMBINDEX, TROPONINI in the last 168 hours. BNP (last 3 results) No results for input(s): PROBNP in the last 8760 hours. HbA1C: Recent Labs    07/08/20 0527  HGBA1C 7.1*   CBG: Recent Labs  Lab 07/09/20 1557 07/09/20 2017 07/10/20 0001 07/10/20 0335 07/10/20 0736  GLUCAP 122* 219* 136* 100* 119*    Lipid Profile: No results for input(s): CHOL, HDL, LDLCALC, TRIG, CHOLHDL, LDLDIRECT in the last 72 hours. Thyroid Function Tests: No results for input(s): TSH, T4TOTAL, FREET4, T3FREE, THYROIDAB in the last 72 hours. Anemia Panel: No results for input(s): VITAMINB12, FOLATE, FERRITIN, TIBC, IRON, RETICCTPCT in the last 72 hours. Sepsis  Labs: No results for input(s): PROCALCITON, LATICACIDVEN in the last 168 hours.  Recent Results (from the past 240 hour(s))  Resp Panel by RT-PCR (Flu A&B, Covid) Nasopharyngeal Swab     Status: None   Collection Time: 07/07/20  8:45 AM   Specimen: Nasopharyngeal Swab; Nasopharyngeal(NP) swabs in vial transport medium  Result Value Ref Range Status   SARS Coronavirus 2 by RT PCR NEGATIVE NEGATIVE Final    Comment: (NOTE) SARS-CoV-2 target nucleic acids are NOT DETECTED.  The SARS-CoV-2 RNA is generally detectable  in upper respiratory specimens during the acute phase of infection. The lowest concentration of SARS-CoV-2 viral copies this assay can detect is 138 copies/mL. A negative result does not preclude SARS-Cov-2 infection and should not be used as the sole basis for treatment or other patient management decisions. A negative result may occur with  improper specimen collection/handling, submission of specimen other than nasopharyngeal swab, presence of viral mutation(s) within the areas targeted by this assay, and inadequate number of viral copies(<138 copies/mL). A negative result must be combined with clinical observations, patient history, and epidemiological information. The expected result is Negative.  Fact Sheet for Patients:  EntrepreneurPulse.com.au  Fact Sheet for Healthcare Providers:  IncredibleEmployment.be  This test is no t yet approved or cleared by the [place] FDA and  has been authorized for detection and/or diagnosis of SARS-CoV-2 by FDA under an Emergency Use Authorization (EUA). This EUA will remain  in effect (meaning this test can be used) for the duration of the COVID-19 declaration under Section 564(b)(1) of the Act, 21 U.S.C.section 360bbb-3(b)(1), unless the authorization is terminated  or revoked sooner.       Influenza A by PCR NEGATIVE NEGATIVE Final   Influenza B by PCR NEGATIVE NEGATIVE Final     Comment: (NOTE) The Xpert Xpress SARS-CoV-2/FLU/RSV plus assay is intended as an aid in the diagnosis of influenza from Nasopharyngeal swab specimens and should not be used as a sole basis for treatment. Nasal washings and aspirates are unacceptable for Xpert Xpress SARS-CoV-2/FLU/RSV testing.  Fact Sheet for Patients: EntrepreneurPulse.com.au  Fact Sheet for Healthcare Providers: IncredibleEmployment.be  This test is not yet approved or cleared by the [place] FDA and has been authorized for detection and/or diagnosis of SARS-CoV-2 by FDA under an Emergency Use Authorization (EUA). This EUA will remain in effect (meaning this test can be used) for the duration of the COVID-19 declaration under Section 564(b)(1) of the Act, 21 U.S.C. section 360bbb-3(b)(1), unless the authorization is terminated or revoked.  Performed at [facility], [place] [address] [place], [place] [address]   Surgical pcr screen     Status: None   Collection Time: 07/07/20  8:06 PM   Specimen: Nasal Mucosa; Nasal Swab  Result Value Ref Range Status   MRSA, PCR NEGATIVE NEGATIVE Final   Staphylococcus aureus NEGATIVE NEGATIVE Final    Comment: (NOTE) The Xpert SA Assay (FDA approved for NASAL specimens in patients 50 years of age and older), is one component of a comprehensive surveillance program. It is not intended to diagnose infection nor to guide or monitor treatment. Performed at [facility], [place] [address], [place], [place] [address]      RN Pressure Injury Documentation:     Estimated body mass index is 34.72 kg/m as calculated from the following:   Height as of 11/29/19: 5\' 10"  (1.778 m).   Weight as of 11/29/19: 109.8 kg.  Malnutrition Type:   Malnutrition Characteristics:   Nutrition Interventions:   Radiology Studies: DG Cholangiogram Operative  Result Date: 07/09/2020 CLINICAL DATA:  Cholecystectomy for  cholelithiasis and cholecystitis. EXAM: INTRAOPERATIVE CHOLANGIOGRAM TECHNIQUE: Cholangiographic images from the C-arm fluoroscopic device were submitted for interpretation post-operatively. Please see the procedural report for the amount of contrast and the fluoroscopy time utilized. COMPARISON:  Ultrasound and CT studies on 07/07/2020 FINDINGS: Intraoperative imaging with a C-arm demonstrates normal caliber of the common bile duct without evidence of obstruction or filling defect. Initial imaging demonstrates a probable air bubble in the proper  hepatic duct superior to the cystic duct insertion which is not present on the second injection. There is mild smooth dilatation of the proper hepatic duct. Contrast enters the duodenum normally. No contrast extravasation. IMPRESSION: Unremarkable intraoperative cholangiogram. Electronically Signed   By: Aletta Edouard M.D.   On: 07/09/2020 12:12    Scheduled Meds:  amLODipine  5 mg Oral QHS   atorvastatin  10 mg Oral QHS   enoxaparin (LOVENOX) injection  40 mg Subcutaneous Q24H   fluticasone  2 spray Each Nare Daily   insulin aspart  0-20 Units Subcutaneous Q4H   pantoprazole (PROTONIX) IV  40 mg Intravenous QHS   polyvinyl alcohol  1 drop Both Eyes BID   tamsulosin  0.4 mg Oral QHS   Continuous Infusions:  sodium chloride 75 mL/hr at 07/10/20 0543   cefTRIAXone (ROCEPHIN)  IV 2 g (07/09/20 1724)   metronidazole 500 mg (07/10/20 0213)    LOS: 0 days   Kerney Elbe, DO [facility] PAGER is on [place]  If 7PM-7AM, please contact night-coverage [email or web address]

## 2020-07-10 NOTE — Plan of Care (Signed)
  Problem: Activity: Goal: Risk for activity intolerance will decrease Outcome: Progressing   Problem: Pain Managment: Goal: General experience of comfort will improve Outcome: Progressing   Problem: Safety: Goal: Ability to remain free from injury will improve Outcome: Progressing   

## 2020-07-10 NOTE — Progress Notes (Signed)
I have reviewed and concur with students documentation.  

## 2020-07-10 NOTE — Progress Notes (Signed)
Discharge package printed and instructions given to pt. Verbalizes understanding. 

## 2020-07-10 NOTE — Discharge Summary (Signed)
Patient ID: Maurice Fox [id number] 05-17-1950 70 y.o.  Admit date: 07/07/2020 Discharge date: 07/10/2020  Admitting Diagnosis: Symptomatic cholelithiasis, probably early acute cholecystitis given the fact that he is still uncomfortable and having pain Obesity OSA on cpap HPL HTN DM2  Discharge Diagnosis Acute and chronic calculus cholecystitis Multiple bilateral liver lesions Supraumbilical hernia Elevated LFT's Obesity OSA on cpap HPL HTN DM2  Consultants [facility]  Reason for Admission: Maurice Fox is an 70 y.o. male who is here for evaluation for recurrent epigastric and right upper quadrant pain that started around 3 AM this morning.  The pain was most intense he had experienced to date.  It radiated to his back.  It was constant.  He had some nausea without emesis.  No fevers or chills.  He denies any angina or shortness of breath.  He has had similar episodes in the past but not nearly as intense as this episode.  He saw one of the surgeons in our office a few days ago and has already been booked for surgery for July 22.  He denies any jaw pain, left upper extremity pain, dyspnea on exertion, orthopnea.   Comorbidities include diabetes mellitus type 2, hypertension, hyperlipidemia, obstructive sleep apnea on CPAP.  He does smoke.  A pack last 1 day   No TIAs or amaurosis fugax   Although there was no signs of cholecystitis on imaging the patient has remained in fair amount of pain since presenting to the emergency room earlier this morning we were called for consultation.   T bili is elevated at 1.7 with mild elevation of his AST.  He also had mildly elevated troponins.  Per the ER team his EKG was unchanged  Procedures Dr. Redmond Pulling - Laparoscopic Cholecystectomy with IOC, laparoscopic wedge biopsy of left lateral hepatic lobe, laparoscopic assisted repair of supraumbilical hernia - 8/46/65  Hospital Course:  Patient admitted for failed outpatient management of  symptomatic cholelithiasis. He was started on abx. [facility] consulted to assist with medical comorbidities. Echo was obtained for elevated Tn. Repeat Tn flat. Echo w/ EF of 50-55% with no Regional Wall motion. He was felt candidate for surgery and Dr. Redmond Pulling performed Laparoscopic Cholecystectomy with IOC, laparoscopic wedge biopsy of left lateral hepatic lobe, laparoscopic assisted repair of supraumbilical hernia on 9/93. Patient tolerated the procedure well. He was transferred back to the floor after surgery. His diet was advanced and tolerated. On POD 1, the patient was voiding well, tolerating diet, ambulating well, pain well controlled, vital signs stable, incisions c/d/i and felt stable for discharge home. At time of discharge path was pending. He was recommended by [facility] to hold atorvastatin for 1 week after d/c and get repeat blood work with PCP in 1 week. Follow up as arranged below.   Physical Exam: Gen:  Alert, NAD, pleasant Card:  RRR Pulm:  CTAB, no W/R/R, effort normal Abd: Soft, ND, appropriately tender, +BS, incisions c/d/i Ext:  No LE edema  Psych: A&Ox3  Skin: no rashes noted, warm and dry   Allergies as of 07/10/2020   No Known Allergies      Medication List     STOP taking these medications    oxyCODONE-acetaminophen 5-325 MG tablet Commonly known as: Percocet       TAKE these medications    amLODipine 5 MG tablet Commonly known as: NORVASC Take 5 mg by mouth at bedtime.   aspirin 81 MG EC tablet Take 81 mg by mouth at bedtime.  atorvastatin 10 MG tablet Commonly known as: LIPITOR Take 1 tablet (10 mg total) by mouth at bedtime. Start taking on: July 17, 2020 What changed: These instructions start on July 17, 2020. If you are unsure what to do until then, ask your doctor or other care provider.   CENTRUM SILVER PO Take 1 tablet by mouth at bedtime.   diclofenac Sodium 1 % Gel Commonly known as: VOLTAREN Apply 2 g topically 4 (four) times daily as needed  for pain.   Empagliflozin-metFORMIN HCl ER 25-1000 MG Tb24 Take 1 tablet by mouth at bedtime.   fluticasone 50 MCG/ACT nasal spray Commonly known as: FLONASE Place 2 sprays into both nostrils in the morning and at bedtime.   oxyCODONE 5 MG immediate release tablet Commonly known as: Oxy IR/ROXICODONE Take 1 tablet (5 mg total) by mouth every 6 (six) hours as needed for breakthrough pain.   sildenafil 100 MG tablet Commonly known as: VIAGRA Take 100 mg by mouth daily as needed (ED).   SYSTANE BALANCE OP Place 1 drop into both eyes 2 (two) times daily.   tamsulosin 0.4 MG Caps capsule Commonly known as: FLOMAX Take 0.4 mg by mouth at bedtime.          Follow-up [facility] Follow up on 07/31/2020.   Specialty: General Surgery Why: 1145am. Please bring a copy of your photo ID and insurance card. Please arrive 30 minutes prior to your appointment for paperwork. Contact information: [address] [place] [phone]        Fanny Bien, MD Follow up.   Specialty: Family Medicine Why: For follow up. Please hold your atorvastatin for 1 week and have LFT's rechecked with your primary care provider. Contact information: [address] [place] [address] [phone]                 Signed: Alferd Apa, [facility] 07/10/2020, 10:25 AM Please see Amion for pager number during day hours 7:00am-4:30pm

## 2020-07-12 LAB — SURGICAL PATHOLOGY

## 2020-07-16 DIAGNOSIS — R630 Anorexia: Secondary | ICD-10-CM | POA: Diagnosis not present

## 2020-07-16 DIAGNOSIS — C229 Malignant neoplasm of liver, not specified as primary or secondary: Secondary | ICD-10-CM | POA: Diagnosis not present

## 2020-07-16 DIAGNOSIS — K8020 Calculus of gallbladder without cholecystitis without obstruction: Secondary | ICD-10-CM | POA: Diagnosis not present

## 2020-07-16 DIAGNOSIS — Z09 Encounter for follow-up examination after completed treatment for conditions other than malignant neoplasm: Secondary | ICD-10-CM | POA: Diagnosis not present

## 2020-07-16 DIAGNOSIS — K5900 Constipation, unspecified: Secondary | ICD-10-CM | POA: Diagnosis not present

## 2020-07-16 DIAGNOSIS — M79659 Pain in unspecified thigh: Secondary | ICD-10-CM | POA: Diagnosis not present

## 2020-07-17 DIAGNOSIS — M79652 Pain in left thigh: Secondary | ICD-10-CM | POA: Diagnosis not present

## 2020-07-19 ENCOUNTER — Telehealth: Payer: Self-pay | Admitting: Hematology and Oncology

## 2020-07-19 DIAGNOSIS — D179 Benign lipomatous neoplasm, unspecified: Secondary | ICD-10-CM | POA: Diagnosis not present

## 2020-07-19 DIAGNOSIS — E1165 Type 2 diabetes mellitus with hyperglycemia: Secondary | ICD-10-CM | POA: Diagnosis not present

## 2020-07-19 DIAGNOSIS — I10 Essential (primary) hypertension: Secondary | ICD-10-CM | POA: Diagnosis not present

## 2020-07-19 DIAGNOSIS — E782 Mixed hyperlipidemia: Secondary | ICD-10-CM | POA: Diagnosis not present

## 2020-07-19 DIAGNOSIS — C8590 Non-Hodgkin lymphoma, unspecified, unspecified site: Secondary | ICD-10-CM | POA: Diagnosis not present

## 2020-07-19 DIAGNOSIS — M79659 Pain in unspecified thigh: Secondary | ICD-10-CM | POA: Diagnosis not present

## 2020-07-19 DIAGNOSIS — Z09 Encounter for follow-up examination after completed treatment for conditions other than malignant neoplasm: Secondary | ICD-10-CM | POA: Diagnosis not present

## 2020-07-19 DIAGNOSIS — M542 Cervicalgia: Secondary | ICD-10-CM | POA: Diagnosis not present

## 2020-07-19 NOTE — Telephone Encounter (Signed)
Received a new pt referral from Dr. Redmond Pulling at [facility] for positive T-cell lymphoproliferative disorder. Maurice Fox has been cld and scheduled to see Dr. Lorenso Courier on 7/6 at 9am. Pt aware to arrive 20 minutes early.

## 2020-07-20 DIAGNOSIS — R739 Hyperglycemia, unspecified: Secondary | ICD-10-CM | POA: Diagnosis not present

## 2020-07-20 DIAGNOSIS — Z125 Encounter for screening for malignant neoplasm of prostate: Secondary | ICD-10-CM | POA: Diagnosis not present

## 2020-07-20 DIAGNOSIS — Z0000 Encounter for general adult medical examination without abnormal findings: Secondary | ICD-10-CM | POA: Diagnosis not present

## 2020-07-25 ENCOUNTER — Inpatient Hospital Stay: Payer: Medicare HMO

## 2020-07-25 ENCOUNTER — Other Ambulatory Visit: Payer: Self-pay

## 2020-07-25 ENCOUNTER — Inpatient Hospital Stay: Payer: Medicare HMO | Attending: Hematology and Oncology | Admitting: Hematology and Oncology

## 2020-07-25 ENCOUNTER — Encounter (HOSPITAL_COMMUNITY): Admission: RE | Admit: 2020-07-25 | Payer: Medicare HMO | Source: Ambulatory Visit

## 2020-07-25 VITALS — BP 138/70 | HR 117 | Temp 98.3°F | Resp 18 | Wt 213.8 lb

## 2020-07-25 DIAGNOSIS — C8499 Mature T/NK-cell lymphomas, unspecified, extranodal and solid organ sites: Secondary | ICD-10-CM

## 2020-07-25 DIAGNOSIS — Z7984 Long term (current) use of oral hypoglycemic drugs: Secondary | ICD-10-CM | POA: Insufficient documentation

## 2020-07-25 DIAGNOSIS — F17210 Nicotine dependence, cigarettes, uncomplicated: Secondary | ICD-10-CM | POA: Diagnosis not present

## 2020-07-25 DIAGNOSIS — Z79899 Other long term (current) drug therapy: Secondary | ICD-10-CM | POA: Diagnosis not present

## 2020-07-25 DIAGNOSIS — I119 Hypertensive heart disease without heart failure: Secondary | ICD-10-CM | POA: Insufficient documentation

## 2020-07-25 DIAGNOSIS — Z5111 Encounter for antineoplastic chemotherapy: Secondary | ICD-10-CM | POA: Insufficient documentation

## 2020-07-25 DIAGNOSIS — Z7982 Long term (current) use of aspirin: Secondary | ICD-10-CM | POA: Diagnosis not present

## 2020-07-25 DIAGNOSIS — Z5189 Encounter for other specified aftercare: Secondary | ICD-10-CM | POA: Insufficient documentation

## 2020-07-25 DIAGNOSIS — Z5112 Encounter for antineoplastic immunotherapy: Secondary | ICD-10-CM | POA: Insufficient documentation

## 2020-07-25 DIAGNOSIS — G4733 Obstructive sleep apnea (adult) (pediatric): Secondary | ICD-10-CM | POA: Diagnosis not present

## 2020-07-25 DIAGNOSIS — E785 Hyperlipidemia, unspecified: Secondary | ICD-10-CM | POA: Diagnosis not present

## 2020-07-25 DIAGNOSIS — E119 Type 2 diabetes mellitus without complications: Secondary | ICD-10-CM | POA: Diagnosis not present

## 2020-07-25 LAB — CBC WITH DIFFERENTIAL (CANCER CENTER ONLY)
Abs Immature Granulocytes: 0.03 10*3/uL (ref 0.00–0.07)
Basophils Absolute: 0.1 10*3/uL (ref 0.0–0.1)
Basophils Relative: 1 %
Eosinophils Absolute: 0 10*3/uL (ref 0.0–0.5)
Eosinophils Relative: 0 %
HCT: 45.6 % (ref 39.0–52.0)
Hemoglobin: 15.2 g/dL (ref 13.0–17.0)
Immature Granulocytes: 1 %
Lymphocytes Relative: 13 %
Lymphs Abs: 0.9 10*3/uL (ref 0.7–4.0)
MCH: 28.7 pg (ref 26.0–34.0)
MCHC: 33.3 g/dL (ref 30.0–36.0)
MCV: 86 fL (ref 80.0–100.0)
Monocytes Absolute: 0.7 10*3/uL (ref 0.1–1.0)
Monocytes Relative: 11 %
Neutro Abs: 4.8 10*3/uL (ref 1.7–7.7)
Neutrophils Relative %: 74 %
Platelet Count: 315 10*3/uL (ref 150–400)
RBC: 5.3 MIL/uL (ref 4.22–5.81)
RDW: 13.2 % (ref 11.5–15.5)
WBC Count: 6.5 10*3/uL (ref 4.0–10.5)
nRBC: 0 % (ref 0.0–0.2)

## 2020-07-25 LAB — CMP (CANCER CENTER ONLY)
ALT: 102 U/L — ABNORMAL HIGH (ref 0–44)
AST: 85 U/L — ABNORMAL HIGH (ref 15–41)
Albumin: 3.1 g/dL — ABNORMAL LOW (ref 3.5–5.0)
Alkaline Phosphatase: 319 U/L — ABNORMAL HIGH (ref 38–126)
Anion gap: 16 — ABNORMAL HIGH (ref 5–15)
BUN: 31 mg/dL — ABNORMAL HIGH (ref 8–23)
CO2: 19 mmol/L — ABNORMAL LOW (ref 22–32)
Calcium: 10.4 mg/dL — ABNORMAL HIGH (ref 8.9–10.3)
Chloride: 102 mmol/L (ref 98–111)
Creatinine: 0.91 mg/dL (ref 0.61–1.24)
GFR, Estimated: >60 mL/min (ref >60)
Glucose, Bld: 105 mg/dL — ABNORMAL HIGH (ref 70–99)
Potassium: 4 mmol/L (ref 3.5–5.1)
Sodium: 137 mmol/L (ref 135–145)
Total Bilirubin: 0.7 mg/dL (ref 0.3–1.2)
Total Protein: 7.7 g/dL (ref 6.5–8.1)

## 2020-07-25 LAB — HEPATITIS B SURFACE ANTIGEN: Hepatitis B Surface Ag: NONREACTIVE

## 2020-07-25 LAB — SEDIMENTATION RATE: Sed Rate: 55 mm/hr — ABNORMAL HIGH (ref 0–16)

## 2020-07-25 LAB — HEPATITIS C ANTIBODY: HCV Ab: NONREACTIVE

## 2020-07-25 LAB — HEPATITIS B SURFACE ANTIBODY,QUALITATIVE: Hep B S Ab: NONREACTIVE

## 2020-07-25 LAB — URIC ACID: Uric Acid, Serum: 6.6 mg/dL (ref 3.7–8.6)

## 2020-07-25 LAB — LACTATE DEHYDROGENASE: LDH: 3315 U/L — ABNORMAL HIGH (ref 98–192)

## 2020-07-25 LAB — HEPATITIS B CORE ANTIBODY, TOTAL: Hep B Core Total Ab: NONREACTIVE

## 2020-07-25 NOTE — Progress Notes (Signed)
[place] [phone]) [phone]   Fax:([phone]) [place] NOTE  Patient Care Team: Fanny Bien, MD as PCP - General (Family Medicine)  Hematological/Oncological History # CD30-Positive T-cell Lymphoproliferative Disorder 07/09/2020: patient underwent cholecystectomy. Liver wedge biopsy during the procedure revealed involvement of a CD30-positive T-cell lymphoproliferative disorder 07/25/2020: establish care with Dr. Lorenso Courier   CHIEF COMPLAINTS/PURPOSE OF CONSULTATION:  "CD30-Positive T-cell Lymphoproliferative Disorder "  HISTORY OF PRESENTING ILLNESS:  Maurice MCMAINS 70 y.o. male with medical history significant for HTN, HLD, OSA on CPAP, DM type II, and arthritis who presents for evaluation of a CD30-Positive T-cell Lymphoproliferative Disorder.   On review of the previous records Maurice Fox underwent a cholecystectomy due to gallstone on 07/09/2020.  At that time a liver wedge biopsy was performed during the procedure which pathology later revealed to contain a CD30 positive T-cell lymphoproliferative disorder.  Due to concern for these findings the patient was referred to hematology for further evaluation and management.  On exam today Maurice Fox reports that he has been having issues with his gallbladder for approximately 3 months or so.  He notes it is predominantly abdominal pain.  He reports that he does still have some residual soreness at the site of the surgery but otherwise he is recovering well.  He is not having any issues with nausea, vomiting, or diarrhea but does endorse having continued constipation.  He also denies having any fevers, chills, sweats.  He reports that his weight has dropped considerably from 245 pounds down to 213 pounds from March until now.  He notes that his appetite has been poor and unfortunately not coming back after the surgery.  On further discussion he notes that his sister has history of colon cancer and his  other sister had a cancer that was "everywhere".  He had 2 brothers with prostate cancer.  He is a current smoker smokes about 1 pack every 3 days.  He used to drink alcohol but quit in the 1980s.  He may on occasion have a mixed drink.  He notes that he was previously employed by the [employer] as a Training and development officer and worked in [place], [place], [place], and hand or wrist.  He is currently tied to the [place].    He does have enlarged lymphadenopathy under his left mandible which is quite firm.  He notes that is developed over the last several weeks.  A full 10 point ROS is listed below.  MEDICAL HISTORY:  Past Medical History:  Diagnosis Date   Arthritis    Hyperlipidemia    Hypertension    followed by pcp  (11-23-2019  per pt had stress test greater than 20 yrs ago, told ok)   Nocturia    OSA on CPAP    per pt uses nightly   Phimosis    Type 2 diabetes mellitus ([place])    followed by pcp   (11-23-2019 per pt does not check blood sugar at home)   Wears glasses     SURGICAL HISTORY: Past Surgical History:  Procedure Laterality Date   APPENDECTOMY  child   CHOLECYSTECTOMY N/A 07/09/2020   Procedure: LAPAROSCOPIC CHOLECYSTECTOMY WITH INTRAOPERATIVE CHOLANGIOGRAM,;  Surgeon: Greer Pickerel, MD;  Location: [facility];  Service: General;  Laterality: N/A;   CIRCUMCISION N/A 11/29/2019   Procedure: CIRCUMCISION ADULT;  Surgeon: [name], MD;  Location: [facility];  Service: Urology;  Laterality: N/A;   LIVER BIOPSY N/A 07/09/2020   Procedure: LAP LIVER BIOPSY;  Surgeon:  Greer Pickerel, MD;  Location: [facility];  Service: General;  Laterality: N/A;   ROTATOR CUFF REPAIR Left chld   SUPRA-UMBILICAL HERNIA N/A 6/59/9357   Procedure: PRIMARY REPAIR SUPRA-UMBILICAL HERNIA;  Surgeon: Greer Pickerel, MD;  Location: [facility];  Service: General;  Laterality: N/A;    SOCIAL HISTORY: Social History   Socioeconomic History   Marital status: Divorced    Spouse name: Not on file   Number of children: Not on file    Years of education: Not on file   Highest education level: Not on file  Occupational History   Not on file  Tobacco Use   Smoking status: Every Day    Packs/day: 1.00    Years: 49.00    Pack years: 49.00    Types: Cigars, Cigarettes   Smokeless tobacco: Never  Vaping Use   Vaping Use: Never used  Substance and Sexual Activity   Alcohol use: Not Currently   Drug use: Never   Sexual activity: Not on file  Other Topics Concern   Not on file  Social History Narrative   Not on file   Social Determinants of Health   Financial Resource Strain: Not on file  Food Insecurity: Not on file  Transportation Needs: Not on file  Physical Activity: Not on file  Stress: Not on file  Social Connections: Not on file  Intimate Partner Violence: Not on file    FAMILY HISTORY: Family History  Problem Relation Age of Onset   Hypotension Mother    Diabetes Mellitus II Father    Prostate cancer Brother    Prostate cancer Brother     ALLERGIES:  has No Known Allergies.  MEDICATIONS:  Current Outpatient Medications  Medication Sig Dispense Refill   amLODipine (NORVASC) 10 MG tablet Take 10 mg by mouth daily.     aspirin 81 MG EC tablet Take 81 mg by mouth at bedtime.     diclofenac Sodium (VOLTAREN) 1 % GEL Apply 2 g topically 4 (four) times daily as needed for pain.     Empagliflozin-metFORMIN HCl ER 25-1000 MG TB24 Take 1 tablet by mouth at bedtime.     fluticasone (FLONASE) 50 MCG/ACT nasal spray Place 2 sprays into both nostrils in the morning and at bedtime.     Multiple Vitamins-Minerals (CENTRUM SILVER PO) Take 1 tablet by mouth at bedtime.     Propylene Glycol (SYSTANE BALANCE OP) Place 1 drop into both eyes 2 (two) times daily.     sildenafil (VIAGRA) 100 MG tablet Take 100 mg by mouth daily as needed (ED).     tamsulosin (FLOMAX) 0.4 MG CAPS capsule Take 0.4 mg by mouth at bedtime.     No current facility-administered medications for this visit.    REVIEW OF SYSTEMS:    Constitutional: ( - ) fevers, ( - )  chills , ( - ) night sweats Eyes: ( - ) blurriness of vision, ( - ) double vision, ( - ) watery eyes Ears, nose, mouth, throat, and face: ( - ) mucositis, ( - ) sore throat Respiratory: ( - ) cough, ( - ) dyspnea, ( - ) wheezes Cardiovascular: ( - ) palpitation, ( - ) chest discomfort, ( - ) lower extremity swelling Gastrointestinal:  ( - ) nausea, ( - ) heartburn, ( - ) change in bowel habits Skin: ( - ) abnormal skin rashes Lymphatics: ( - ) new lymphadenopathy, ( - ) easy bruising Neurological: ( - ) numbness, ( - ) tingling, ( - )  new weaknesses Behavioral/Psych: ( - ) mood change, ( - ) new changes  All other systems were reviewed with the patient and are negative.  PHYSICAL EXAMINATION: ECOG PERFORMANCE STATUS: 1 - Symptomatic but completely ambulatory  Vitals:   07/25/20 0906  BP: 138/70  Pulse: (!) 117  Resp: 18  Temp: 98.3 F (36.8 C)  SpO2: 99%   Filed Weights   07/25/20 0906  Weight: 213 lb 12.8 oz (97 kg)    GENERAL: well appearing elderly African-American male in NAD  SKIN: skin color, texture, turgor are normal, no rashes or significant lesions EYES: conjunctiva are pink and non-injected, sclera clear OROPHARYNX: no exudate, no erythema; lips, buccal mucosa, and tongue normal  NECK: supple, non-tender LYMPH:  palpable lymphadenopathy in the left submandibular lymph nodes, however no palpable cervical, axillary or supraclavicular lymph nodes.  LUNGS: clear to auscultation and percussion with normal breathing effort HEART: regular rate & rhythm and no murmurs and no lower extremity edema PSYCH: alert & oriented x 3, fluent speech NEURO: no focal motor/sensory deficits  LABORATORY DATA:  I have reviewed the data as listed CBC Latest Ref Rng & Units 07/25/2020 07/10/2020 07/09/2020  WBC 4.0 - 10.5 K/uL 6.5 8.0 6.4  Hemoglobin 13.0 - 17.0 g/dL 15.2 15.5 14.7  Hematocrit 39.0 - 52.0 % 45.6 47.1 44.9  Platelets 150 - 400 K/uL 315  252 214    CMP Latest Ref Rng & Units 07/25/2020 07/10/2020 07/09/2020  Glucose 70 - 99 mg/dL 105(H) 105(H) 79  BUN 8 - 23 mg/dL 31(H) 15 19  Creatinine 0.61 - 1.24 mg/dL 0.91 0.87 0.85  Sodium 135 - 145 mmol/L 137 139 138  Potassium 3.5 - 5.1 mmol/L 4.0 4.1 3.5  Chloride 98 - 111 mmol/L 102 108 106  CO2 22 - 32 mmol/L 19(L) 21(L) 23  Calcium 8.9 - 10.3 mg/dL 10.4(H) 9.1 8.8(L)  Total Protein 6.5 - 8.1 g/dL 7.7 7.4 6.9  Total Bilirubin 0.3 - 1.2 mg/dL 0.7 0.6 0.7  Alkaline Phos 38 - 126 U/L 319(H) 97 97  AST 15 - 41 U/L 85(H) 85(H) 36  ALT 0 - 44 U/L 102(H) 63(H) 26     PATHOLOGY: SURGICAL PATHOLOGY  CASE: [id number]  PATIENT: Vivien Rossetti  Surgical Pathology Report   Clinical History: cholecystitis  FINAL MICROSCOPIC DIAGNOSIS:   A. GALLBLADDER, CHOLECYSTECTOMY:  - Chronic cholecystitis with cholelithiasis.   B. LIVER, LEFT WEDGE, BIOPSY:  - CD30-positive T-cell lymphoproliferative disorder, see comment.   COMMENT:   B. Sections of liver display large foci with sheets of abnormal  lymphocytes. The lymphocytes are large with vesicular nuclei. There are  frequent larger multinucleated cells. Immunohistochemistry is positive  for CD30 (diffuse strong), CD15 (dot-like), CD4 (strong), CD3 (patchy  weak), CD5 (focal weak), CD45, MUM1, and bcl-2. They are negative for  ALK protein, CD20, PAX5, CD10, CD23, bcl-6, EBV in situ hybridization,  CDX2, pancytokeratin, CD56, and synaptophysin. The findings are  consistent with a CD30-positive T-cell lymphoproliferative disorder.  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 Dr. Dois Davenport nurse was notified on 07/12/2020.   GROSS DESCRIPTION:   A: Size/?Intact: A disrupted gallbladder measuring 10.8 x 3.0 x 1.8 cm  Serosal surface: Tan-pink, smooth, with multiple defects along the body  of the gallbladder   Mucosa/Wall: The mucosa is tan-red, and the wall measures up to 0.3 cm  in thickness  Contents: Gallbladder contains a minimal amount  of tan-red bile, and a  single tan-brown, ovoid calculus measuring 3.8 x 1.9 x 1.6 cm  Cystic duct: 0.3 cm in diameter  Block Summary:  1 block submitted   B: The specimen is received fresh and consists of a 1.6 x 1.0 x 0.9 cm  piece of tan-brown slightly granular parenchyma.  Sectioning reveals tan  parenchyma with 3 tan-white, glistening nodules ranging from 0.5 to 0.9  cm in greatest dimension.  The specimen is trisected and entirely  submitted in 1 cassette.   Craig Staggers 07/09/2020)   Final Diagnosis performed by Vicente Males, MD.    RADIOGRAPHIC STUDIES: I have personally reviewed the radiological images as listed and agreed with the findings in the report. DG Cholangiogram Operative  Result Date: 07/09/2020 CLINICAL DATA:  Cholecystectomy for cholelithiasis and cholecystitis. EXAM: INTRAOPERATIVE CHOLANGIOGRAM TECHNIQUE: Cholangiographic images from the C-arm fluoroscopic device were submitted for interpretation post-operatively. Please see the procedural report for the amount of contrast and the fluoroscopy time utilized. COMPARISON:  Ultrasound and CT studies on 07/07/2020 FINDINGS: Intraoperative imaging with a C-arm demonstrates normal caliber of the common bile duct without evidence of obstruction or filling defect. Initial imaging demonstrates a probable air bubble in the proper hepatic duct superior to the cystic duct insertion which is not present on the second injection. There is mild smooth dilatation of the proper hepatic duct. Contrast enters the duodenum normally. No contrast extravasation. IMPRESSION: Unremarkable intraoperative cholangiogram. Electronically Signed   By: Aletta Edouard M.D.   On: 07/09/2020 12:12   CT ABDOMEN PELVIS W CONTRAST  Result Date: 07/07/2020 CLINICAL DATA:  Upper abdominal pain for 1 month. History of gallstones EXAM:  CT ABDOMEN AND PELVIS WITH CONTRAST TECHNIQUE: Multidetector CT imaging of the abdomen and pelvis was performed using the standard protocol following bolus administration of intravenous contrast. CONTRAST:  140m OMNIPAQUE IOHEXOL 300 MG/ML  SOLN COMPARISON:  Ultrasound 07/07/2020, MRI 05/05/2020 FINDINGS: Lower chest: Mild dependent subsegmental atelectasis. Lung bases are otherwise clear. Patulous distal esophagus. Heart size within normal limits. Hepatobiliary: Numerous small primarily subcentimeter low-density lesions scattered throughout both hepatic lobes, possibly small cysts or biliary hamartomas. Multiple stones again seen within the gallbladder lumen. No pericholecystic inflammatory changes by CT. No biliary dilatation. Pancreas: Unremarkable. No pancreatic ductal dilatation or surrounding inflammatory changes. Spleen: Normal in size without focal abnormality. Adrenals/Urinary Tract: Indeterminate density 1.8 cm left adrenal nodule. Unremarkable right adrenal gland. Redemonstrated 2.4 cm hyperdense right renal cyst, recently classified as a benign hyperdense cyst on MRI. Additional smaller cysts within the right kidney measuring up to 1.8 cm are unchanged. No left-sided renal lesion. No renal stone or hydronephrosis. Urinary bladder is unremarkable. Stomach/Bowel: Stomach is within normal limits. Appendix not definitively seen. Moderate volume of stool within the colon. No evidence of bowel wall thickening, distention, or inflammatory changes. Vascular/Lymphatic: Atherosclerosis throughout the aortoiliac axis without aneurysm. No abdominopelvic lymphadenopathy. Reproductive: Prostate is unremarkable. Other: No free fluid. No abdominopelvic fluid collection. No pneumoperitoneum. Small fat containing supraumbilical hernia. Musculoskeletal: No acute or significant osseous findings. Partially visualized intramuscular lipoma within the left quadratus femoris muscle measuring at least 5.4 x 2.7 cm. Numerous tiny  intra-articular calcifications within the right hip joint suggesting synovial chondromatosis. IMPRESSION: 1. No acute abdominopelvic findings. 2. Cholelithiasis without evidence of acute cholecystitis. 3. Moderate volume of stool within the colon. 4. Small fat containing supraumbilical hernia. 5. Indeterminate 1.8 cm left adrenal nodule. 6. Partially visualized intramuscular lipoma within the left quadratus femoris muscle measuring at least 5.4 cm.  7. Numerous tiny intra-articular calcifications within the right hip joint suggesting synovial chondromatosis. Aortic Atherosclerosis (ICD10-I70.0). Electronically Signed   By: Davina Poke D.O.   On: 07/07/2020 12:31   DG Chest Portable 1 View  Result Date: 07/07/2020 CLINICAL DATA:  Chest pain. Additional provided: Patient reports upper abdominal pain intermittently for 1 month since being diagnosed with gallstones. Pain worse this morning. History of diabetes and hypertension. EXAM: PORTABLE CHEST 1 VIEW COMPARISON:  Prior chest CT 04/12/2020. FINDINGS: Please note portions of the lateral costophrenic angles are excluded from the field of view bilaterally. Shallow inspiration radiograph. Cardiomegaly. Mild right perihilar/right basilar atelectasis. No appreciable airspace consolidation. No evidence of pleural effusion or pneumothorax. No acute bony abnormality identified IMPRESSION: Please note portions of the lateral costophrenic angles are excluded from the field of view bilaterally. Cardiomegaly. Shallow inspiration radiograph with mild right perihilar/right basilar atelectasis. No appreciable airspace consolidation or pulmonary edema. Electronically Signed   By: Kellie Simmering DO   On: 07/07/2020 10:02   ECHOCARDIOGRAM COMPLETE  Result Date: 07/08/2020    ECHOCARDIOGRAM REPORT   Patient Name:   Maurice Fox Date of Exam: 07/08/2020 Medical Rec #:  [id number]       Height:       70.0 in Accession #:    [id number]      Weight:       242.0 lb Date of Birth:   [birth year]       BSA:          2.263 m Patient Age:    29 years        BP:           147/88 mmHg Patient Gender: M               HR:           93 bpm. Exam Location:  Inpatient Procedure: 2D Echo, Cardiac Doppler, Color Doppler and Strain Analysis Indications:    Elevated Troponin                 Abnormal ECG R94.31  History:        Patient has no prior history of Echocardiogram examinations.                 Risk Factors:Hypertension, Dyslipidemia, Sleep Apnea and                 Diabetes. Cholelithiasis.  Sonographer:    Darlina Sicilian RDCS Referring Phys: [id number] DAVID MANUEL [place]  1. Left ventricular ejection fraction, by estimation, is 50 to 55%. The left ventricle has low normal function. The left ventricle has no regional wall motion abnormalities. There is moderate asymmetric left ventricular hypertrophy of the basal-septal segment. Left ventricular diastolic parameters are consistent with Grade I diastolic dysfunction (impaired relaxation). Elevated left ventricular end-diastolic pressure.  2. Right ventricular systolic function is normal. The right ventricular size is normal.  3. The mitral valve is grossly normal. Trivial mitral valve regurgitation.  4. The aortic valve is tricuspid. Aortic valve regurgitation is not visualized.  5. Aortic dilatation noted. There is borderline dilatation of the aortic root, measuring 38 mm.  6. The inferior vena cava is normal in size with greater than 50% respiratory variability, suggesting right atrial pressure of 3 mmHg. Comparison(s): No prior Echocardiogram. FINDINGS  Left Ventricle: Left ventricular ejection fraction, by estimation, is 50 to 55%. The left ventricle has low normal function. The left ventricle has no regional wall motion abnormalities. The  left ventricular internal cavity size was normal in size. There is moderate asymmetric left ventricular hypertrophy of the basal-septal segment. Left ventricular diastolic parameters are consistent with  Grade I diastolic dysfunction (impaired relaxation). Elevated left ventricular end-diastolic pressure. Right Ventricle: The right ventricular size is normal. No increase in right ventricular wall thickness. Right ventricular systolic function is normal. Left Atrium: Left atrial size was normal in size. Right Atrium: Right atrial size was normal in size. Pericardium: There is no evidence of pericardial effusion. Mitral Valve: The mitral valve is grossly normal. Trivial mitral valve regurgitation. Tricuspid Valve: The tricuspid valve is grossly normal. Tricuspid valve regurgitation is trivial. Aortic Valve: The aortic valve is tricuspid. Aortic valve regurgitation is not visualized. Pulmonic Valve: The pulmonic valve was grossly normal. Pulmonic valve regurgitation is not visualized. Aorta: Aortic dilatation noted. There is borderline dilatation of the aortic root, measuring 38 mm. Venous: The inferior vena cava is normal in size with greater than 50% respiratory variability, suggesting right atrial pressure of 3 mmHg. IAS/Shunts: No atrial level shunt detected by color flow Doppler.  LEFT VENTRICLE PLAX 2D LVIDd:         4.70 cm      Diastology LVIDs:         3.70 cm      LV e' medial:    4.87 cm/s LV PW:         1.20 cm      LV E/e' medial:  16.3 LV IVS:        1.80 cm      LV e' lateral:   4.56 cm/s LVOT diam:     2.40 cm      LV E/e' lateral: 17.4 LV SV:         79 LV SV Index:   35 LVOT Area:     4.52 cm  LV Volumes (MOD) LV vol d, MOD A2C: 153.0 ml LV vol d, MOD A4C: 201.0 ml LV vol s, MOD A2C: 73.3 ml LV vol s, MOD A4C: 99.1 ml LV SV MOD A2C:     79.7 ml LV SV MOD A4C:     201.0 ml LV SV MOD BP:      90.3 ml RIGHT VENTRICLE RV S prime:     13.30 cm/s TAPSE (M-mode): 1.7 cm LEFT ATRIUM             Index LA diam:        3.90 cm 1.72 cm/m LA Vol (A2C):   39.7 ml 17.54 ml/m LA Vol (A4C):   29.5 ml 13.03 ml/m LA Biplane Vol: 34.8 ml 15.37 ml/m  AORTIC VALVE LVOT Vmax:   93.55 cm/s LVOT Vmean:  61.650 cm/s LVOT  VTI:    0.174 m  AORTA Ao Root diam: 3.90 cm Ao Asc diam:  3.70 cm MITRAL VALVE MV Area (PHT): 5.54 cm     SHUNTS MV Decel Time: 137 msec     Systemic VTI:  0.17 m MV E velocity: 79.40 cm/s   Systemic Diam: 2.40 cm MV A velocity: 114.00 cm/s MV E/A ratio:  0.70 Lyman Bishop MD Electronically signed by Lyman Bishop MD Signature Date/Time: 07/08/2020/12:17:34 PM    Final    US Abdomen Limited RUQ (LIVER/GB)  Result Date: 07/07/2020 CLINICAL DATA:  70 year old male with right upper quadrant abdominal pain this morning. EXAM: ULTRASOUND ABDOMEN LIMITED RIGHT UPPER QUADRANT COMPARISON:  Abdomen MRI 05/05/2020. ultrasound 06/04/2020. FINDINGS: Gallbladder: Gallstone again noted, this measured up to 2.7 cm on the  April MRI. Gallbladder wall thickness remains normal at 2 mm. No pericholecystic fluid. No sonographic Murphy sign elicited. Common bile duct: Diameter: 4 mm, normal. Liver: There was no discrete liver lesion on the April MRI without and with contrast, or the ultrasound last month. Therefore, the circumscribed anechoic appearing areas identified today (such as on image 21) are felt to be prominent hepatic veins. Portal vein is patent on color Doppler imaging with normal direction of blood flow towards the liver. Other: Negative visible right kidney. IMPRESSION: 1. Chronic cholelithiasis without evidence of acute cholecystitis. 2. Suspect increased size of the hepatic veins from the prior studies this year. Query volume overload, CHF. Electronically Signed   By: Genevie Ann M.D.   On: 07/07/2020 09:41    ASSESSMENT & PLAN JOSEPHUS HARRIGER 70 y.o. male with medical history significant for HTN, HLD, OSA on CPAP, DM type II, and arthritis who presents for evaluation of a CD30-Positive T-cell Lymphoproliferative Disorder.   After review of the labs, review of the records, and discussion with the patient the patients findings are most consistent with a peripheral CD30 positive T-cell lymphoma.  The neck step will  be to stage the patient appropriately with a PET CT scan.  He will also require a port placed.  A TTE was fortunately ordered last month and shows ejection fraction of 50 to 55%.  Once the above studies are completed as well as her blood work today we will plan to have the patient back for discussion of starting chemotherapy.  At this time the findings are most consistent with CD30 positive T-cell lymphoma and therefore BV CHP would be appropriate.  If there is any uncertainty about the diagnosis we could ask for an excisional biopsy of one of the lymph nodes in his neck.  # CD30-Positive T-cell Lymphoproliferative Disorder --prior CT abdomen showed no evidence of lymphadenopathy or clear involvement of a T cell lymphoma --will order PET CT scan today and port placement --TTE performed 07/08/2020, shows EF of 50-55% --will collect baseline CMP, CBC, LDH today --additionally will collect HIV, Hep B and C serologies.  --RTC pending the results of the above studies.   Orders Placed This Encounter  Procedures   NM PET Image Initial (PI) Skull Base To Thigh    Standing Status:   Future    Standing Expiration Date:   07/25/2021    Order Specific Question:   If indicated for the ordered procedure, I authorize the administration of a radiopharmaceutical per Radiology protocol    Answer:   Yes    Order Specific Question:   Preferred imaging location?    Answer:   [place]   IR IMAGING GUIDED PORT INSERTION    Standing Status:   Future    Standing Expiration Date:   07/25/2021    Order Specific Question:   Reason for Exam (SYMPTOM  OR DIAGNOSIS REQUIRED)    Answer:   need for chemotherapy for lymphoma    Order Specific Question:   Preferred Imaging Location?    Answer:   [facility]   CBC with Differential ([place] Only)    Standing Status:   Future    Number of Occurrences:   1    Standing Expiration Date:   07/25/2021   CMP ([place] only)    Standing Status:   Future     Number of Occurrences:   1    Standing Expiration Date:   07/25/2021   Lactate dehydrogenase (LDH)  Standing Status:   Future    Number of Occurrences:   1    Standing Expiration Date:   07/25/2021   Uric acid    Standing Status:   Future    Number of Occurrences:   1    Standing Expiration Date:   07/25/2021   Hepatitis C antibody    Standing Status:   Future    Number of Occurrences:   1    Standing Expiration Date:   07/25/2021   Hepatitis B surface antigen    Standing Status:   Future    Number of Occurrences:   1    Standing Expiration Date:   07/25/2021   Hepatitis B surface antibody    Standing Status:   Future    Number of Occurrences:   1    Standing Expiration Date:   07/25/2021   Hepatitis B core antibody, total    Standing Status:   Future    Number of Occurrences:   1    Standing Expiration Date:   07/25/2021   Sedimentation rate    Standing Status:   Future    Number of Occurrences:   1    Standing Expiration Date:   07/25/2021    All questions were answered. The patient knows to call the clinic with any problems, questions or concerns.  A total of more than 60 minutes were spent on this encounter with face-to-face time and non-face-to-face time, including preparing to see the patient, ordering tests and/or medications, counseling the patient and coordination of care as outlined above.   Ledell Peoples, MD Department of Hematology/Oncology Dent at [facility] Phone: [phone] Pager: [phone] Email: [email or web address]  07/25/2020 4:52 PM

## 2020-07-30 ENCOUNTER — Telehealth: Payer: Self-pay | Admitting: *Deleted

## 2020-07-30 NOTE — Telephone Encounter (Signed)
Received call from patient. He states he has developed an achiness around his waist and down his legs. It has gotten worse the last few days. He tried 1 advil daily w/o relief. Discussed with Dr. Lorenso Courier and he recommends 800mg  Ibuprofen every 6 hours with food. If no relief over the next couple of days. Pt is to call back. Advised that it is unclear if this pain is related to his cancer or not as he has not had a PET scan yet. Pet scan is scheduled for 08/08/20, port on 08/06/20. Pt voiced understanding.

## 2020-08-02 ENCOUNTER — Emergency Department (HOSPITAL_COMMUNITY)
Admission: EM | Admit: 2020-08-02 | Discharge: 2020-08-02 | Disposition: A | Discharge status: Home / Self Care | Payer: Medicare HMO | Attending: Emergency Medicine | Admitting: Emergency Medicine

## 2020-08-02 ENCOUNTER — Emergency Department (HOSPITAL_COMMUNITY): Payer: Medicare HMO

## 2020-08-02 DIAGNOSIS — M62830 Muscle spasm of back: Secondary | ICD-10-CM | POA: Diagnosis not present

## 2020-08-02 DIAGNOSIS — E119 Type 2 diabetes mellitus without complications: Secondary | ICD-10-CM | POA: Insufficient documentation

## 2020-08-02 DIAGNOSIS — N289 Disorder of kidney and ureter, unspecified: Secondary | ICD-10-CM | POA: Diagnosis not present

## 2020-08-02 DIAGNOSIS — M545 Low back pain, unspecified: Secondary | ICD-10-CM | POA: Insufficient documentation

## 2020-08-02 DIAGNOSIS — R079 Chest pain, unspecified: Secondary | ICD-10-CM | POA: Insufficient documentation

## 2020-08-02 DIAGNOSIS — I10 Essential (primary) hypertension: Secondary | ICD-10-CM | POA: Diagnosis not present

## 2020-08-02 DIAGNOSIS — R0789 Other chest pain: Secondary | ICD-10-CM | POA: Diagnosis not present

## 2020-08-02 DIAGNOSIS — R109 Unspecified abdominal pain: Secondary | ICD-10-CM | POA: Diagnosis not present

## 2020-08-02 DIAGNOSIS — I700 Atherosclerosis of aorta: Secondary | ICD-10-CM | POA: Diagnosis not present

## 2020-08-02 DIAGNOSIS — M549 Dorsalgia, unspecified: Secondary | ICD-10-CM

## 2020-08-02 DIAGNOSIS — F17210 Nicotine dependence, cigarettes, uncomplicated: Secondary | ICD-10-CM | POA: Diagnosis not present

## 2020-08-02 DIAGNOSIS — C8590 Non-Hodgkin lymphoma, unspecified, unspecified site: Secondary | ICD-10-CM | POA: Diagnosis not present

## 2020-08-02 DIAGNOSIS — E1165 Type 2 diabetes mellitus with hyperglycemia: Secondary | ICD-10-CM | POA: Diagnosis not present

## 2020-08-02 DIAGNOSIS — I517 Cardiomegaly: Secondary | ICD-10-CM | POA: Diagnosis not present

## 2020-08-02 DIAGNOSIS — K7689 Other specified diseases of liver: Secondary | ICD-10-CM | POA: Diagnosis not present

## 2020-08-02 DIAGNOSIS — R7989 Other specified abnormal findings of blood chemistry: Secondary | ICD-10-CM | POA: Diagnosis not present

## 2020-08-02 LAB — CBC
HCT: 41.8 % (ref 39.0–52.0)
Hemoglobin: 13.5 g/dL (ref 13.0–17.0)
MCH: 28.2 pg (ref 26.0–34.0)
MCHC: 32.3 g/dL (ref 30.0–36.0)
MCV: 87.3 fL (ref 80.0–100.0)
Platelets: 265 10*3/uL (ref 150–400)
RBC: 4.79 MIL/uL (ref 4.22–5.81)
RDW: 13.5 % (ref 11.5–15.5)
WBC: 4.9 10*3/uL (ref 4.0–10.5)
nRBC: 0 % (ref 0.0–0.2)

## 2020-08-02 LAB — TROPONIN I (HIGH SENSITIVITY)
Troponin I (High Sensitivity): 34 ng/L — ABNORMAL HIGH (ref <18)
Troponin I (High Sensitivity): 31 ng/L — ABNORMAL HIGH (ref <18)

## 2020-08-02 LAB — COMPREHENSIVE METABOLIC PANEL
ALT: 284 U/L — ABNORMAL HIGH (ref 0–44)
AST: 250 U/L — ABNORMAL HIGH (ref 15–41)
Albumin: 3.1 g/dL — ABNORMAL LOW (ref 3.5–5.0)
Alkaline Phosphatase: 532 U/L — ABNORMAL HIGH (ref 38–126)
Anion gap: 9 (ref 5–15)
BUN: 18 mg/dL (ref 8–23)
CO2: 24 mmol/L (ref 22–32)
Calcium: 9.4 mg/dL (ref 8.9–10.3)
Chloride: 108 mmol/L (ref 98–111)
Creatinine, Ser: 0.58 mg/dL — ABNORMAL LOW (ref 0.61–1.24)
GFR, Estimated: >60 mL/min (ref >60)
Glucose, Bld: 95 mg/dL (ref 70–99)
Potassium: 3.7 mmol/L (ref 3.5–5.1)
Sodium: 141 mmol/L (ref 135–145)
Total Bilirubin: 0.8 mg/dL (ref 0.3–1.2)
Total Protein: 6.6 g/dL (ref 6.5–8.1)

## 2020-08-02 LAB — LIPASE, BLOOD: Lipase: 83 U/L — ABNORMAL HIGH (ref 11–51)

## 2020-08-02 MED ORDER — METHOCARBAMOL 500 MG PO TABS: 500.0000 mg | ORAL_TABLET | Freq: Three times a day (TID) | ORAL | 0 refills | Status: DC | PRN

## 2020-08-02 MED ORDER — KETOROLAC TROMETHAMINE 15 MG/ML IJ SOLN
15.0000 mg | Freq: Once | INTRAMUSCULAR | Status: AC
  Administered 2020-08-02: 15 mg via INTRAMUSCULAR
  Filled 2020-08-02: qty 1

## 2020-08-02 MED ORDER — HYDROMORPHONE HCL 1 MG/ML IJ SOLN
1.0000 mg | Freq: Once | INTRAMUSCULAR | Status: AC
  Administered 2020-08-02: 1 mg via INTRAMUSCULAR
  Filled 2020-08-02: qty 1

## 2020-08-02 NOTE — ED Provider Notes (Signed)
[facility] Emergency Department Provider Note MRN:  [id number]  Arrival date & time: 08/02/20     Chief Complaint   Spasms   History of Present Illness   Maurice Fox is a 70 y.o. year-old male with a history of type 2 diabetes, lymphoma presenting to the ED with chief complaint of spasms.  Patient is having lower back spasms, upper back spasms, chest pain described as spasms.  Has had this pain in these areas multiple times over the past several months.  Much worse starting this evening at 9 PM.  Currently 10 out of 10 in severity, constant, worse with certain motions or positions.  Denies any shortness of breath, no fever or cough, no abdominal pain, no numbness or weakness to the arms or legs, no bowel or bladder dysfunction.  Diagnosed with a T-cell lymphoproliferative disease 2 weeks ago.  Review of Systems  A complete 10 system review of systems was obtained and all systems are negative except as noted in the HPI and PMH.   Patient's Health History    Past Medical History:  Diagnosis Date   Arthritis    Hyperlipidemia    Hypertension    followed by pcp  (11-23-2019  per pt had stress test greater than 20 yrs ago, told ok)   Nocturia    OSA on CPAP    per pt uses nightly   Phimosis    Type 2 diabetes mellitus ([place])    followed by pcp   (11-23-2019 per pt does not check blood sugar at home)   Wears glasses     Past Surgical History:  Procedure Laterality Date   APPENDECTOMY  child   CHOLECYSTECTOMY N/A 07/09/2020   Procedure: LAPAROSCOPIC CHOLECYSTECTOMY WITH INTRAOPERATIVE CHOLANGIOGRAM,;  Surgeon: Greer Pickerel, MD;  Location: [facility];  Service: General;  Laterality: N/A;   CIRCUMCISION N/A 11/29/2019   Procedure: CIRCUMCISION ADULT;  Surgeon: [name], MD;  Location: [facility];  Service: Urology;  Laterality: N/A;   LIVER BIOPSY N/A 07/09/2020   Procedure: LAP LIVER BIOPSY;  Surgeon: Greer Pickerel, MD;  Location: [facility];  Service: General;  Laterality: N/A;   ROTATOR CUFF REPAIR Left chld   SUPRA-UMBILICAL HERNIA N/A [id number]/86/5784   Procedure: PRIMARY REPAIR SUPRA-UMBILICAL HERNIA;  Surgeon: Greer Pickerel, MD;  Location: [facility];  Service: General;  Laterality: N/A;    Family History  Problem Relation Age of Onset   Hypotension Mother    Diabetes Mellitus II Father    Prostate cancer Brother    Prostate cancer Brother     Social History   Socioeconomic History   Marital status: Divorced    Spouse name: Not on file   Number of children: Not on file   Years of education: Not on file   Highest education level: Not on file  Occupational History   Not on file  Tobacco Use   Smoking status: Every Day    Packs/day: 1.00    Years: 49.00    Pack years: 49.00    Types: Cigars, Cigarettes   Smokeless tobacco: Never  Vaping Use   Vaping Use: Never used  Substance and Sexual Activity   Alcohol use: Not Currently   Drug use: Never   Sexual activity: Not on file  Other Topics Concern   Not on file  Social History Narrative   Not on file   Social Determinants of Health   Financial Resource Strain: Not on file  Food Insecurity:  Not on file  Transportation Needs: Not on file  Physical Activity: Not on file  Stress: Not on file  Social Connections: Not on file  Intimate Partner Violence: Not on file     Physical Exam   Vitals:   08/02/20 0430 08/02/20 0445  BP: (!) 160/86 (!) 132/108  Pulse: 90 87  Resp: 20 15  Temp:    SpO2: 99% 99%    CONSTITUTIONAL: Well-appearing, NAD NEURO:  Alert and oriented x 3, normal and symmetric strength and sensation, normal coordination, normal speech EYES:  eyes equal and reactive ENT/NECK:  no LAD, no JVD CARDIO: Regular rate, well-perfused, normal S1 and S2 PULM:  CTAB no wheezing or rhonchi GI/GU:  normal bowel sounds, non-distended, non-tender MSK/SPINE:  No gross deformities, no edema SKIN:  no rash, atraumatic PSYCH:  Appropriate speech and  behavior  *Additional and/or pertinent findings included in MDM below  Diagnostic and Interventional Summary    EKG Interpretation  Date/Time:  Thursday August 02 2020 02:51:02 EDT Ventricular Rate:  86 PR Interval:  210 QRS Duration: 141 QT Interval:  398 QTC Calculation: 476 R Axis:   -76 Text Interpretation: Sinus rhythm Right bundle branch block Inferior infarct, old Lateral leads are also involved No significant change was found Confirmed by Gerlene Fee [phone]) on 08/02/2020 2:53:37 AM       Labs Reviewed  COMPREHENSIVE METABOLIC PANEL - Abnormal; Notable for the following components:      Result Value   Creatinine, Ser 0.58 (*)    Albumin 3.1 (*)    AST 250 (*)    ALT 284 (*)    Alkaline Phosphatase 532 (*)    All other components within normal limits  LIPASE, BLOOD - Abnormal; Notable for the following components:   Lipase 83 (*)    All other components within normal limits  TROPONIN I (HIGH SENSITIVITY) - Abnormal; Notable for the following components:   Troponin I (High Sensitivity) 34 (*)    All other components within normal limits  TROPONIN I (HIGH SENSITIVITY) - Abnormal; Notable for the following components:   Troponin I (High Sensitivity) 31 (*)    All other components within normal limits  CBC    CT ABDOMEN PELVIS WO CONTRAST  Final Result    DG Chest Port 1 View  Final Result      Medications  HYDROmorphone (DILAUDID) injection 1 mg (1 mg Intramuscular Given 08/02/20 0230)  ketorolac (TORADOL) 15 MG/ML injection 15 mg (15 mg Intramuscular Given 08/02/20 0228)     Procedures  /  Critical Care Procedures  ED Course and Medical Decision Making  I have reviewed the triage vital signs, the nursing notes, and pertinent available records from the EMR.  Listed above are laboratory and imaging tests that I personally ordered, reviewed, and interpreted and then considered in my medical decision making (see below for details).  Favoring MSK etiology of  patient's pain, which he has experienced multiple times in the past.  Given the chest pain and age and risk factors will obtain EKG, troponin.  Does have a new diagnosis of cancer, PE is considered but felt to be very unlikely, no tachycardia, no tachypnea, no hypoxia, no evidence of DVT, no shortness of breath.  Will provide symptomatic management and reassess.     On reassessment pain is resolved, first troponin minimally elevated, will repeat.  LFTs somewhat elevated as well.  Does not drink alcohol.  Some mild epigastric tenderness on exam, awaiting CT.  Lipase  is added on and is minimally elevated, CT without signs of pancreatitis, overall reassuring.  Could have an early case of pancreatitis, or laboratory abnormalities could be explained by cirrhosis seen on imaging.  Either way patient is sitting comfortably, no pain, normal vital signs, abdominal exam reassuring, soft, nontender.  Has close follow-up with primary care doctor and oncology, strict return precautions for worsening epigastric pain.  Barth Kirks. Sedonia Small, [place] [email or web address]  Final Clinical Impressions(s) / ED Diagnoses     ICD-10-CM   1. Acute back pain, unspecified back location, unspecified back pain laterality  M54.9     2. Chest pain, unspecified type  R07.9     3. Elevated LFTs  R79.89       ED Discharge Orders          Ordered    methocarbamol (ROBAXIN) 500 MG tablet  Every 8 hours PRN        08/02/20 0557             Discharge Instructions Discussed with and Provided to Patient:     Discharge Instructions      You were evaluated in the Emergency Department and after careful evaluation, we did not find any emergent condition requiring admission or further testing in the hospital.  Your exam/testing today was overall reassuring.  Recommend close follow-up with your regular doctors to discuss your symptoms and your blood tests  today.  Please return to the Emergency Department if you experience any worsening of your condition.  Thank you for allowing [place] to be a part of your care.         Maudie Flakes, MD 08/02/20 [phone]

## 2020-08-02 NOTE — ED Triage Notes (Signed)
Pt arrived to ED tonight from home with c/o muscle spasms in chest and lower back.  Pt states this has been going on for a while, but tonight he took motrin and could not get the pain to stop.

## 2020-08-02 NOTE — Discharge Instructions (Addendum)
You were evaluated in the Emergency Department and after careful evaluation, we did not find any emergent condition requiring admission or further testing in the hospital.  Your exam/testing today was overall reassuring.  Recommend close follow-up with your regular doctors to discuss your symptoms and your blood tests today.  Please return to the Emergency Department if you experience any worsening of your condition.  Thank you for allowing [place] to be a part of your care.

## 2020-08-03 ENCOUNTER — Other Ambulatory Visit: Payer: Self-pay | Admitting: Radiology

## 2020-08-06 ENCOUNTER — Encounter (HOSPITAL_COMMUNITY): Payer: Self-pay

## 2020-08-06 ENCOUNTER — Other Ambulatory Visit: Payer: Self-pay

## 2020-08-06 ENCOUNTER — Ambulatory Visit (HOSPITAL_COMMUNITY)
Admission: RE | Admit: 2020-08-06 | Discharge: 2020-08-06 | Disposition: A | Discharge status: Home / Self Care | Payer: Medicare HMO | Source: Ambulatory Visit | Attending: Hematology and Oncology | Admitting: Hematology and Oncology

## 2020-08-06 DIAGNOSIS — Z833 Family history of diabetes mellitus: Secondary | ICD-10-CM | POA: Insufficient documentation

## 2020-08-06 DIAGNOSIS — E119 Type 2 diabetes mellitus without complications: Secondary | ICD-10-CM | POA: Diagnosis not present

## 2020-08-06 DIAGNOSIS — G4733 Obstructive sleep apnea (adult) (pediatric): Secondary | ICD-10-CM | POA: Insufficient documentation

## 2020-08-06 DIAGNOSIS — Z9049 Acquired absence of other specified parts of digestive tract: Secondary | ICD-10-CM | POA: Diagnosis not present

## 2020-08-06 DIAGNOSIS — I119 Hypertensive heart disease without heart failure: Secondary | ICD-10-CM | POA: Insufficient documentation

## 2020-08-06 DIAGNOSIS — Z8249 Family history of ischemic heart disease and other diseases of the circulatory system: Secondary | ICD-10-CM | POA: Diagnosis not present

## 2020-08-06 DIAGNOSIS — C8440 Peripheral T-cell lymphoma, not classified, unspecified site: Secondary | ICD-10-CM | POA: Diagnosis not present

## 2020-08-06 DIAGNOSIS — Z79899 Other long term (current) drug therapy: Secondary | ICD-10-CM | POA: Insufficient documentation

## 2020-08-06 DIAGNOSIS — E785 Hyperlipidemia, unspecified: Secondary | ICD-10-CM | POA: Insufficient documentation

## 2020-08-06 DIAGNOSIS — C8499 Mature T/NK-cell lymphomas, unspecified, extranodal and solid organ sites: Secondary | ICD-10-CM | POA: Insufficient documentation

## 2020-08-06 DIAGNOSIS — Z8042 Family history of malignant neoplasm of prostate: Secondary | ICD-10-CM | POA: Diagnosis not present

## 2020-08-06 DIAGNOSIS — F17210 Nicotine dependence, cigarettes, uncomplicated: Secondary | ICD-10-CM | POA: Insufficient documentation

## 2020-08-06 DIAGNOSIS — Z7982 Long term (current) use of aspirin: Secondary | ICD-10-CM | POA: Insufficient documentation

## 2020-08-06 DIAGNOSIS — Z452 Encounter for adjustment and management of vascular access device: Secondary | ICD-10-CM | POA: Diagnosis not present

## 2020-08-06 DIAGNOSIS — Z7984 Long term (current) use of oral hypoglycemic drugs: Secondary | ICD-10-CM | POA: Diagnosis not present

## 2020-08-06 HISTORY — PX: IR IMAGING GUIDED PORT INSERTION: IMG5740

## 2020-08-06 LAB — GLUCOSE, CAPILLARY: Glucose-Capillary: 126 mg/dL — ABNORMAL HIGH (ref 70–99)

## 2020-08-06 MED ORDER — FENTANYL CITRATE (PF) 100 MCG/2ML IJ SOLN
INTRAMUSCULAR | Status: AC | PRN
  Administered 2020-08-06 (×2): 50 ug via INTRAVENOUS

## 2020-08-06 MED ORDER — HEPARIN SOD (PORK) LOCK FLUSH 100 UNIT/ML IV SOLN
INTRAVENOUS | Status: AC | PRN
  Administered 2020-08-06: 500 [IU] via INTRAVENOUS

## 2020-08-06 MED ORDER — MIDAZOLAM HCL 2 MG/2ML IJ SOLN
INTRAMUSCULAR | Status: AC
  Filled 2020-08-06: qty 2

## 2020-08-06 MED ORDER — LIDOCAINE HCL (PF) 1 % IJ SOLN
INTRAMUSCULAR | Status: AC | PRN
  Administered 2020-08-06 (×2): 10 mL via INTRADERMAL

## 2020-08-06 MED ORDER — HEPARIN SOD (PORK) LOCK FLUSH 100 UNIT/ML IV SOLN
INTRAVENOUS | Status: AC
  Filled 2020-08-06: qty 5

## 2020-08-06 MED ORDER — SODIUM CHLORIDE 0.9 % IV SOLN: INTRAVENOUS | Status: DC

## 2020-08-06 MED ORDER — MIDAZOLAM HCL 2 MG/2ML IJ SOLN
INTRAMUSCULAR | Status: AC | PRN
  Administered 2020-08-06 (×2): 1 mg via INTRAVENOUS

## 2020-08-06 MED ORDER — LIDOCAINE HCL 1 % IJ SOLN
INTRAMUSCULAR | Status: AC
  Filled 2020-08-06: qty 20

## 2020-08-06 MED ORDER — FENTANYL CITRATE (PF) 100 MCG/2ML IJ SOLN
INTRAMUSCULAR | Status: AC
  Filled 2020-08-06: qty 2

## 2020-08-06 NOTE — Discharge Instructions (Signed)
Interventional radiology phone numbers °[phone] °After hours [phone] ° ° ° °You have skin glue (dermabond) over your new port. Do not use the lidocaine cream (EMLA cream) over the skin glue until it has healed. The petroleum in the lidocaine cream will dissolve the skin glue resulting in an infection of your new port. Use ice in a zip lock bag for 1-2 minutes over your new port before the cancer center nurses access your port. ° ° °Implanted Port Insertion, Care After °This sheet gives you information about how to care for yourself after your procedure. Your health care provider may also give you more specific instructions. If you have problems or questions, contact your health care provider. °What can I expect after the procedure? °After the procedure, it is common to have: °Discomfort at the port insertion site. °Bruising on the skin over the port. This should improve over 3-4 days. °Follow these instructions at home: °Port care °After your port is placed, you will get a manufacturer's information card. The card has information about your port. Keep this card with you at all times. °Take care of the port as told by your health care provider. Ask your health care provider if you or a family member can get training for taking care of the port at home. A home health care nurse may also take care of the port. °Make sure to remember what type of port you have. °Incision care °Follow instructions from your health care provider about how to take care of your port insertion site. Make sure you: °Wash your hands with soap and water before and after you change your bandage (dressing). If soap and water are not available, use hand sanitizer. °Change your dressing as told by your health care provider. °Leave skin glue in place. These skin closures may need to stay in place for 2 weeks or longer.  °Check your port insertion site every day for signs of infection. Check for: °Redness, swelling, or pain. °Fluid or  blood. °Warmth. °Pus or a bad smell.  °  °  °Activity °Return to your normal activities as told by your health care provider. Ask your health care provider what activities are safe for you. °Do not lift anything that is heavier than 10 lb (4.5 kg), or the limit that you are told, until your health care provider says that it is safe. °General instructions °Take over-the-counter and prescription medicines only as told by your health care provider. °Do not take baths, swim, or use a hot tub until your health care provider approves.You may remove your dressing tomorrow and shower 24 hours after your procedure. °Do not drive for 24 hours if you were given a sedative during your procedure. °Wear a medical alert bracelet in case of an emergency. This will tell any health care providers that you have a port. °Keep all follow-up visits as told by your health care provider. This is important. °Contact a health care provider if: °You cannot flush your port with saline as directed, or you cannot draw blood from the port. °You have a fever or chills. °You have redness, swelling, or pain around your port insertion site. °You have fluid or blood coming from your port insertion site. °Your port insertion site feels warm to the touch. °You have pus or a bad smell coming from the port insertion site. °Get help right away if: °You have chest pain or shortness of breath. °You have bleeding from your port that you cannot control. °Summary °Take care of   the port as told by your health care provider. Keep the manufacturer's information card with you at all times. °Change your dressing as told by your health care provider. °Contact a health care provider if you have a fever or chills or if you have redness, swelling, or pain around your port insertion site. °Keep all follow-up visits as told by your health care provider. °This information is not intended to replace advice given to you by your health care provider. Make sure you discuss any  questions you have with your health care provider. °Document Revised: 08/04/2017 Document Reviewed: 08/04/2017 °Elsevier Patient Education © 2021 [employer] ° ° ° °Moderate Conscious Sedation, Adult, Care After °This sheet gives you information about how to care for yourself after your procedure. Your health care provider may also give you more specific instructions. If you have problems or questions, contact your health care provider. °What can I expect after the procedure? °After the procedure, it is common to have: °Sleepiness for several hours. °Impaired judgment for several hours. °Difficulty with balance. °Vomiting if you eat too soon. °Follow these instructions at home: °For the time period you were told by your health care provider: °Rest. °Do not participate in activities where you could fall or become injured. °Do not drive or use machinery. °Do not drink alcohol. °Do not take sleeping pills or medicines that cause drowsiness. °Do not make important decisions or sign legal documents. °Do not take care of children on your own.  °  °  °Eating and drinking °Follow the diet recommended by your health care provider. °Drink enough fluid to keep your urine pale yellow. °If you vomit: °Drink water, juice, or soup when you can drink without vomiting. °Make sure you have little or no nausea before eating solid foods.   °General instructions °Take over-the-counter and prescription medicines only as told by your health care provider. °Have a responsible adult stay with you for the time you are told. It is important to have someone help care for you until you are awake and alert. °Do not smoke. °Keep all follow-up visits as told by your health care provider. This is important. °Contact a health care provider if: °You are still sleepy or having trouble with balance after 24 hours. °You feel light-headed. °You keep feeling nauseous or you keep vomiting. °You develop a rash. °You have a fever. °You have redness or  swelling around the IV site. °Get help right away if: °You have trouble breathing. °You have new-onset confusion at home. °Summary °After the procedure, it is common to feel sleepy, have impaired judgment, or feel nauseous if you eat too soon. °Rest after you get home. Know the things you should not do after the procedure. °Follow the diet recommended by your health care provider and drink enough fluid to keep your urine pale yellow. °Get help right away if you have trouble breathing or new-onset confusion at home. °This information is not intended to replace advice given to you by your health care provider. Make sure you discuss any questions you have with your health care provider. °Document Revised: 05/06/2019 Document Reviewed: 12/02/2018 °Elsevier Patient Education © 2021 [employer]  °

## 2020-08-06 NOTE — Procedures (Signed)
Interventional Radiology Procedure Note  Procedure: Single Lumen Power Port Placement    Access:  Right IJ vein.  Findings: Catheter tip positioned at SVC/RA junction. Port is ready for immediate use.   Complications: None  EBL: < 10 mL  Recommendations:  - Ok to shower in 24 hours - Do not submerge for 7 days - Routine line care   Maurice Fox, M.D Pager:  [phone]   

## 2020-08-06 NOTE — H&P (Signed)
Chief Complaint: Patient was seen in consultation today for port placement at the request of [name],[name]  Referring Physician(s): [name],[name]  Supervising Physician: Aletta Edouard  Patient Status: [facility] - Out-pt  History of Present Illness: Maurice Fox is a 70 y.o. male with lymphoma. He is referred for port placement. PMHx, meds, labs, imaging, allergies reviewed. Feels well, no recent fevers, chills, illness. Has been NPO today as directed.   Past Medical History:  Diagnosis Date   Arthritis    Hyperlipidemia    Hypertension    followed by pcp  (11-23-2019  per pt had stress test greater than 20 yrs ago, told ok)   Nocturia    OSA on CPAP    per pt uses nightly   Phimosis    Type 2 diabetes mellitus ([place])    followed by pcp   (11-23-2019 per pt does not check blood sugar at home)   Wears glasses     Past Surgical History:  Procedure Laterality Date   APPENDECTOMY  child   CHOLECYSTECTOMY N/A 07/09/2020   Procedure: LAPAROSCOPIC CHOLECYSTECTOMY WITH INTRAOPERATIVE CHOLANGIOGRAM,;  Surgeon: Greer Pickerel, MD;  Location: [facility];  Service: General;  Laterality: N/A;   CIRCUMCISION N/A 11/29/2019   Procedure: CIRCUMCISION ADULT;  Surgeon: [name], MD;  Location: [facility];  Service: Urology;  Laterality: N/A;   LIVER BIOPSY N/A 07/09/2020   Procedure: LAP LIVER BIOPSY;  Surgeon: Greer Pickerel, MD;  Location: [facility];  Service: General;  Laterality: N/A;   ROTATOR CUFF REPAIR Left chld   SUPRA-UMBILICAL HERNIA N/A 7/40/8144   Procedure: PRIMARY REPAIR SUPRA-UMBILICAL HERNIA;  Surgeon: Greer Pickerel, MD;  Location: [facility];  Service: General;  Laterality: N/A;    Allergies: Patient has no known allergies.  Medications: Prior to Admission medications   Medication Sig Start Date End Date Taking? Authorizing Provider  diclofenac Sodium (VOLTAREN) 1 % GEL Apply 2 g topically 4 (four) times daily as needed for pain. 03/28/20  Yes  [provider]  methocarbamol (ROBAXIN) 500 MG tablet Take 1 tablet (500 mg total) by mouth every 8 (eight) hours as needed for muscle spasms. 08/02/20  Yes Maudie Flakes, MD  Propylene Glycol (SYSTANE BALANCE OP) Place 1 drop into both eyes 2 (two) times daily.   Yes [provider]  sildenafil (VIAGRA) 100 MG tablet Take 100 mg by mouth daily as needed (ED).   Yes [provider]  amLODipine (NORVASC) 10 MG tablet Take 10 mg by mouth daily. 07/03/20   [provider]  aspirin 81 MG EC tablet Take 81 mg by mouth at bedtime.    [provider]  Empagliflozin-metFORMIN HCl ER 25-1000 MG TB24 Take 1 tablet by mouth at bedtime.    [provider]  fluticasone (FLONASE) 50 MCG/ACT nasal spray Place 2 sprays into both nostrils in the morning and at bedtime.    [provider]  Multiple Vitamins-Minerals (CENTRUM SILVER PO) Take 1 tablet by mouth at bedtime.    [provider]  rosuvastatin (CRESTOR) 5 MG tablet Take 5 mg by mouth at bedtime. 07/26/20   [provider]  tamsulosin (FLOMAX) 0.4 MG CAPS capsule Take 0.4 mg by mouth at bedtime.    [provider]     Family History  Problem Relation Age of Onset   Hypotension Mother    Diabetes Mellitus II Father    Prostate cancer Brother    Prostate cancer Brother  Social History   Socioeconomic History   Marital status: Divorced    Spouse name: Not on file   Number of children: Not on file   Years of education: Not on file   Highest education level: Not on file  Occupational History   Not on file  Tobacco Use   Smoking status: Every Day    Packs/day: 1.00    Years: 49.00    Pack years: 49.00    Types: Cigars, Cigarettes   Smokeless tobacco: Never  Vaping Use   Vaping Use: Never used  Substance and Sexual Activity   Alcohol use: Not Currently   Drug use: Never   Sexual activity: Not on file  Other Topics Concern   Not on file  Social  History Narrative   Not on file   Social Determinants of Health   Financial Resource Strain: Not on file  Food Insecurity: Not on file  Transportation Needs: Not on file  Physical Activity: Not on file  Stress: Not on file  Social Connections: Not on file    Review of Systems: A 12 point ROS discussed and pertinent positives are indicated in the HPI above.  All other systems are negative.  Review of Systems  Vital Signs: BP (!) 149/78   Pulse (!) 107   Temp 98.1 F (36.7 C) (Oral)   Resp (!) 22   SpO2 99%   Physical Exam Constitutional:      Appearance: Normal appearance. He is not ill-appearing.  HENT:     Mouth/Throat:     Mouth: Mucous membranes are moist.     Pharynx: Oropharynx is clear.  Cardiovascular:     Rate and Rhythm: Normal rate and regular rhythm.     Heart sounds: Normal heart sounds.  Pulmonary:     Effort: Pulmonary effort is normal. No respiratory distress.     Breath sounds: Normal breath sounds.  Abdominal:     General: Abdomen is flat.     Palpations: Abdomen is soft.     Tenderness: There is no abdominal tenderness.  Skin:    General: Skin is warm and dry.  Neurological:     General: No focal deficit present.     Mental Status: He is alert and oriented to person, place, and time.  Psychiatric:        Mood and Affect: Mood normal.        Thought Content: Thought content normal.        Judgment: Judgment normal.      Imaging: CT ABDOMEN PELVIS WO CONTRAST  Result Date: 08/02/2020 CLINICAL DATA:  70 year old male with history of acute onset of nonlocalized abdominal pain and bilateral flank pain. Elevated liver function tests. EXAM: CT ABDOMEN AND PELVIS WITHOUT CONTRAST TECHNIQUE: Multidetector CT imaging of the abdomen and pelvis was performed following the standard protocol without IV contrast. COMPARISON:  CT the abdomen and pelvis 07/07/2020 FINDINGS: Lower chest: Linear areas of scarring are noted in the lower lobes of the lungs  bilaterally. Atherosclerotic calcifications in the descending thoracic aorta as well as the right coronary artery. Hepatobiliary: Diffuse low attenuation throughout the hepatic parenchyma, indicative of a background of hepatic steatosis. Liver has a shrunken appearance and very nodular contour, indicative of underlying cirrhosis. No discrete cystic or solid hepatic lesions are confidently identified on today's noncontrast CT examination, although the parenchyma does appear diffusely heterogeneous, suggesting a background of regenerative of nodules in the setting of cirrhosis. Status post cholecystectomy. Pancreas: No pancreatic mass. No  pancreatic ductal dilatation. No pancreatic or peripancreatic fluid collections or inflammatory changes. Spleen: Unremarkable. Adrenals/Urinary Tract: Multiple small renal lesions bilaterally ranging from low to high attenuation, incompletely characterized on today's non-contrast CT examination, but likely to represent a combination of simple cysts and proteinaceous/hemorrhagic cysts. No calcifications are identified within the collecting system of either kidney, along the course of either ureter, or within the lumen of the urinary bladder. No hydroureteronephrosis. Unenhanced appearance of the urinary bladder is normal. Right adrenal gland is normal in appearance. 1.6 x 1.2 cm intermediate attenuation (27 HU) left adrenal nodule, stable compared to the prior study. Stomach/Bowel: Unenhanced appearance of the stomach is normal. No pathologic dilatation of small bowel or colon. The appendix is not confidently identified and may be surgically absent. Regardless, there are no inflammatory changes noted adjacent to the cecum to suggest the presence of an acute appendicitis at this time. Vascular/Lymphatic: Aortic atherosclerosis. No lymphadenopathy noted in the abdomen or pelvis. Reproductive: Prostate gland and seminal vesicles are unremarkable in appearance. Other: No significant  volume of ascites.  No pneumoperitoneum. Musculoskeletal: There are no aggressive appearing lytic or blastic lesions noted in the visualized portions of the skeleton. IMPRESSION: 1. No acute findings are noted in the abdomen or pelvis to account for the patient's symptoms. 2. Morphologic changes in the liver indicative of cirrhosis, as well as a background of hepatic steatosis. 3. Status post cholecystectomy since the prior study. 4. Multiple small renal lesions bilaterally, incompletely characterize, but favored to represent a combination of small cysts and proteinaceous/hemorrhagic cysts. There is also an indeterminate left adrenal nodule which is stable. These findings could be definitively characterized with follow-up nonemergent outpatient MRI of the abdomen with and without IV gadolinium if of clinical concern. 5. Aortic atherosclerosis, in addition to least right coronary artery disease. Please note that although the presence of coronary artery calcium documents the presence of coronary artery disease, the severity of this disease and any potential stenosis cannot be assessed on this non-gated CT examination. Assessment for potential risk factor modification, dietary therapy or pharmacologic therapy may be warranted, if clinically indicated. Electronically Signed   By: Vinnie Langton M.D.   On: 08/02/2020 05:37   DG Cholangiogram Operative  Result Date: 07/09/2020 CLINICAL DATA:  Cholecystectomy for cholelithiasis and cholecystitis. EXAM: INTRAOPERATIVE CHOLANGIOGRAM TECHNIQUE: Cholangiographic images from the C-arm fluoroscopic device were submitted for interpretation post-operatively. Please see the procedural report for the amount of contrast and the fluoroscopy time utilized. COMPARISON:  Ultrasound and CT studies on 07/07/2020 FINDINGS: Intraoperative imaging with a C-arm demonstrates normal caliber of the common bile duct without evidence of obstruction or filling defect. Initial imaging  demonstrates a probable air bubble in the proper hepatic duct superior to the cystic duct insertion which is not present on the second injection. There is mild smooth dilatation of the proper hepatic duct. Contrast enters the duodenum normally. No contrast extravasation. IMPRESSION: Unremarkable intraoperative cholangiogram. Electronically Signed   By: Aletta Edouard M.D.   On: 07/09/2020 12:12   CT ABDOMEN PELVIS W CONTRAST  Result Date: 07/07/2020 CLINICAL DATA:  Upper abdominal pain for 1 month. History of gallstones EXAM: CT ABDOMEN AND PELVIS WITH CONTRAST TECHNIQUE: Multidetector CT imaging of the abdomen and pelvis was performed using the standard protocol following bolus administration of intravenous contrast. CONTRAST:  129mL OMNIPAQUE IOHEXOL 300 MG/ML  SOLN COMPARISON:  Ultrasound 07/07/2020, MRI 05/05/2020 FINDINGS: Lower chest: Mild dependent subsegmental atelectasis. Lung bases are otherwise clear. Patulous distal esophagus. Heart size within  normal limits. Hepatobiliary: Numerous small primarily subcentimeter low-density lesions scattered throughout both hepatic lobes, possibly small cysts or biliary hamartomas. Multiple stones again seen within the gallbladder lumen. No pericholecystic inflammatory changes by CT. No biliary dilatation. Pancreas: Unremarkable. No pancreatic ductal dilatation or surrounding inflammatory changes. Spleen: Normal in size without focal abnormality. Adrenals/Urinary Tract: Indeterminate density 1.8 cm left adrenal nodule. Unremarkable right adrenal gland. Redemonstrated 2.4 cm hyperdense right renal cyst, recently classified as a benign hyperdense cyst on MRI. Additional smaller cysts within the right kidney measuring up to 1.8 cm are unchanged. No left-sided renal lesion. No renal stone or hydronephrosis. Urinary bladder is unremarkable. Stomach/Bowel: Stomach is within normal limits. Appendix not definitively seen. Moderate volume of stool within the colon. No  evidence of bowel wall thickening, distention, or inflammatory changes. Vascular/Lymphatic: Atherosclerosis throughout the aortoiliac axis without aneurysm. No abdominopelvic lymphadenopathy. Reproductive: Prostate is unremarkable. Other: No free fluid. No abdominopelvic fluid collection. No pneumoperitoneum. Small fat containing supraumbilical hernia. Musculoskeletal: No acute or significant osseous findings. Partially visualized intramuscular lipoma within the left quadratus femoris muscle measuring at least 5.4 x 2.7 cm. Numerous tiny intra-articular calcifications within the right hip joint suggesting synovial chondromatosis. IMPRESSION: 1. No acute abdominopelvic findings. 2. Cholelithiasis without evidence of acute cholecystitis. 3. Moderate volume of stool within the colon. 4. Small fat containing supraumbilical hernia. 5. Indeterminate 1.8 cm left adrenal nodule. 6. Partially visualized intramuscular lipoma within the left quadratus femoris muscle measuring at least 5.4 cm. 7. Numerous tiny intra-articular calcifications within the right hip joint suggesting synovial chondromatosis. Aortic Atherosclerosis (ICD10-I70.0). Electronically Signed   By: Davina Poke D.O.   On: 07/07/2020 12:31   DG Chest Port 1 View  Result Date: 08/02/2020 CLINICAL DATA:  70 year old male with chest pain. EXAM: PORTABLE CHEST 1 VIEW COMPARISON:  Chest radiograph dated 07/08/2018 FINDINGS: There is cardiomegaly with mild vascular congestion. No focal consolidation, pleural effusion, or pneumothorax. Atherosclerotic calcification of the aortic arch. Degenerative changes of the spine. No acute osseous pathology. IMPRESSION: Cardiomegaly with mild vascular congestion. No focal consolidation. Electronically Signed   By: Anner Crete M.D.   On: 08/02/2020 01:46   ECHOCARDIOGRAM COMPLETE  Result Date: 07/08/2020    ECHOCARDIOGRAM REPORT   Patient Name:   ROWEN WILMER Date of Exam: 07/08/2020 Medical Rec #:  [id number]        Height:       70.0 in Accession #:    [id number]      Weight:       242.0 lb Date of Birth:  12/11/1950       BSA:          2.263 m Patient Age:    89 years        BP:           147/88 mmHg Patient Gender: M               HR:           93 bpm. Exam Location:  Inpatient Procedure: 2D Echo, Cardiac Doppler, Color Doppler and Strain Analysis Indications:    Elevated Troponin                 Abnormal ECG R94.31  History:        Patient has no prior history of Echocardiogram examinations.                 Risk Factors:Hypertension, Dyslipidemia, Sleep Apnea and  Diabetes. Cholelithiasis.  Sonographer:    Darlina Sicilian RDCS Referring Phys: [id number] DAVID MANUEL [place]  1. Left ventricular ejection fraction, by estimation, is 50 to 55%. The left ventricle has low normal function. The left ventricle has no regional wall motion abnormalities. There is moderate asymmetric left ventricular hypertrophy of the basal-septal segment. Left ventricular diastolic parameters are consistent with Grade I diastolic dysfunction (impaired relaxation). Elevated left ventricular end-diastolic pressure.  2. Right ventricular systolic function is normal. The right ventricular size is normal.  3. The mitral valve is grossly normal. Trivial mitral valve regurgitation.  4. The aortic valve is tricuspid. Aortic valve regurgitation is not visualized.  5. Aortic dilatation noted. There is borderline dilatation of the aortic root, measuring 38 mm.  6. The inferior vena cava is normal in size with greater than 50% respiratory variability, suggesting right atrial pressure of 3 mmHg. Comparison(s): No prior Echocardiogram. FINDINGS  Left Ventricle: Left ventricular ejection fraction, by estimation, is 50 to 55%. The left ventricle has low normal function. The left ventricle has no regional wall motion abnormalities. The left ventricular internal cavity size was normal in size. There is moderate asymmetric left ventricular  hypertrophy of the basal-septal segment. Left ventricular diastolic parameters are consistent with Grade I diastolic dysfunction (impaired relaxation). Elevated left ventricular end-diastolic pressure. Right Ventricle: The right ventricular size is normal. No increase in right ventricular wall thickness. Right ventricular systolic function is normal. Left Atrium: Left atrial size was normal in size. Right Atrium: Right atrial size was normal in size. Pericardium: There is no evidence of pericardial effusion. Mitral Valve: The mitral valve is grossly normal. Trivial mitral valve regurgitation. Tricuspid Valve: The tricuspid valve is grossly normal. Tricuspid valve regurgitation is trivial. Aortic Valve: The aortic valve is tricuspid. Aortic valve regurgitation is not visualized. Pulmonic Valve: The pulmonic valve was grossly normal. Pulmonic valve regurgitation is not visualized. Aorta: Aortic dilatation noted. There is borderline dilatation of the aortic root, measuring 38 mm. Venous: The inferior vena cava is normal in size with greater than 50% respiratory variability, suggesting right atrial pressure of 3 mmHg. IAS/Shunts: No atrial level shunt detected by color flow Doppler.  LEFT VENTRICLE PLAX 2D LVIDd:         4.70 cm      Diastology LVIDs:         3.70 cm      LV e' medial:    4.87 cm/s LV PW:         1.20 cm      LV E/e' medial:  16.3 LV IVS:        1.80 cm      LV e' lateral:   4.56 cm/s LVOT diam:     2.40 cm      LV E/e' lateral: 17.4 LV SV:         79 LV SV Index:   35 LVOT Area:     4.52 cm  LV Volumes (MOD) LV vol d, MOD A2C: 153.0 ml LV vol d, MOD A4C: 201.0 ml LV vol s, MOD A2C: 73.3 ml LV vol s, MOD A4C: 99.1 ml LV SV MOD A2C:     79.7 ml LV SV MOD A4C:     201.0 ml LV SV MOD BP:      90.3 ml RIGHT VENTRICLE RV S prime:     13.30 cm/s TAPSE (M-mode): 1.7 cm LEFT ATRIUM             Index LA diam:  3.90 cm 1.72 cm/m LA Vol (A2C):   39.7 ml 17.54 ml/m LA Vol (A4C):   29.5 ml 13.03 ml/m LA  Biplane Vol: 34.8 ml 15.37 ml/m  AORTIC VALVE LVOT Vmax:   93.55 cm/s LVOT Vmean:  61.650 cm/s LVOT VTI:    0.174 m  AORTA Ao Root diam: 3.90 cm Ao Asc diam:  3.70 cm MITRAL VALVE MV Area (PHT): 5.54 cm     SHUNTS MV Decel Time: 137 msec     Systemic VTI:  0.17 m MV E velocity: 79.40 cm/s   Systemic Diam: 2.40 cm MV A velocity: 114.00 cm/s MV E/A ratio:  0.70 Lyman Bishop MD Electronically signed by Lyman Bishop MD Signature Date/Time: 07/08/2020/12:17:34 PM    Final     Labs:  CBC: Recent Labs    07/09/20 0344 07/10/20 0333 07/25/20 0946 08/02/20 0232  WBC 6.4 8.0 6.5 4.9  HGB 14.7 15.5 15.2 13.5  HCT 44.9 47.1 45.6 41.8  PLT 214 252 315 265    COAGS: No results for input(s): INR, APTT in the last 8760 hours.  BMP: Recent Labs    07/09/20 0344 07/10/20 0333 07/25/20 0946 08/02/20 0232  NA 138 139 137 141  K 3.5 4.1 4.0 3.7  CL 106 108 102 108  CO2 23 21* 19* 24  GLUCOSE 79 105* 105* 95  BUN 19 15 31* 18  CALCIUM 8.8* 9.1 10.4* 9.4  CREATININE 0.85 0.87 0.91 0.58*  GFRNONAA >60 >60 >60 >60    LIVER FUNCTION TESTS: Recent Labs    07/09/20 0344 07/10/20 0333 07/25/20 0946 08/02/20 0232  BILITOT 0.7 0.6 0.7 0.8  AST 36 85* 85* 250*  ALT 26 63* 102* 284*  ALKPHOS 97 97 319* 532*  PROT 6.9 7.4 7.7 6.6  ALBUMIN 3.4* 3.6 3.1* 3.1*    TUMOR MARKERS: No results for input(s): AFPTM, CEA, CA199, CHROMGRNA in the last 8760 hours.  Assessment and Plan: Lymphoma For port. Risks and benefits of image guided port-a-catheter placement was discussed with the patient including, but not limited to bleeding, infection, pneumothorax, or fibrin sheath development and need for additional procedures.  All of the patient's questions were answered, patient is agreeable to proceed. Consent signed and in chart.   Thank you for this interesting consult.  I greatly enjoyed meeting Maurice Fox and look forward to participating in their care.  A copy of this report was sent to  the requesting provider on this date.  Electronically Signed: Ascencion Dike, PA-C 08/06/2020, 11:11 AM   I spent a total of 20 minutes in face to face in clinical consultation, greater than 50% of which was counseling/coordinating care for port

## 2020-08-08 ENCOUNTER — Ambulatory Visit (HOSPITAL_COMMUNITY)
Admission: RE | Admit: 2020-08-08 | Discharge: 2020-08-08 | Disposition: A | Discharge status: Home / Self Care | Payer: Medicare HMO | Source: Ambulatory Visit | Attending: Hematology and Oncology | Admitting: Hematology and Oncology

## 2020-08-08 ENCOUNTER — Other Ambulatory Visit: Payer: Self-pay

## 2020-08-08 DIAGNOSIS — C8440 Peripheral T-cell lymphoma, not classified, unspecified site: Secondary | ICD-10-CM | POA: Diagnosis not present

## 2020-08-08 DIAGNOSIS — I700 Atherosclerosis of aorta: Secondary | ICD-10-CM | POA: Insufficient documentation

## 2020-08-08 DIAGNOSIS — C8499 Mature T/NK-cell lymphomas, unspecified, extranodal and solid organ sites: Secondary | ICD-10-CM | POA: Diagnosis not present

## 2020-08-08 DIAGNOSIS — M47814 Spondylosis without myelopathy or radiculopathy, thoracic region: Secondary | ICD-10-CM | POA: Diagnosis not present

## 2020-08-08 DIAGNOSIS — R911 Solitary pulmonary nodule: Secondary | ICD-10-CM | POA: Insufficient documentation

## 2020-08-08 DIAGNOSIS — I2510 Atherosclerotic heart disease of native coronary artery without angina pectoris: Secondary | ICD-10-CM | POA: Insufficient documentation

## 2020-08-08 DIAGNOSIS — R590 Localized enlarged lymph nodes: Secondary | ICD-10-CM | POA: Insufficient documentation

## 2020-08-08 LAB — GLUCOSE, CAPILLARY: Glucose-Capillary: 127 mg/dL — ABNORMAL HIGH (ref 70–99)

## 2020-08-08 MED ORDER — FLUDEOXYGLUCOSE F - 18 (FDG) INJECTION
10.5000 | Freq: Once | INTRAVENOUS | Status: AC | PRN
  Administered 2020-08-08: 10.33 via INTRAVENOUS

## 2020-08-09 ENCOUNTER — Encounter: Payer: Self-pay | Admitting: Hematology and Oncology

## 2020-08-09 ENCOUNTER — Other Ambulatory Visit: Payer: Self-pay | Admitting: Hematology and Oncology

## 2020-08-09 ENCOUNTER — Ambulatory Visit: Admit: 2020-08-09 | Payer: Medicare HMO | Admitting: General Surgery

## 2020-08-09 DIAGNOSIS — C8442 Peripheral T-cell lymphoma, not classified, intrathoracic lymph nodes: Secondary | ICD-10-CM | POA: Insufficient documentation

## 2020-08-09 SURGERY — LAPAROSCOPIC CHOLECYSTECTOMY WITH INTRAOPERATIVE CHOLANGIOGRAM
Anesthesia: General

## 2020-08-09 NOTE — Progress Notes (Signed)
START ON PATHWAY REGIMEN - Lymphoma and CLL     A cycle is every 21 days:     Cyclophosphamide      Doxorubicin      Prednisone      Brentuximab vedotin   **Always confirm dose/schedule in your pharmacy ordering system**  Patient Characteristics: T-Cell Lymphoma (Systemic), First Line, AITL (Angioimmunoblastic T-Cell Lymphoma) or ALCL (Anaplastic Large Cell Lymphoma) or Peripheral T-Cell, NOS, CD30 Positive Disease Type: Not Applicable Disease Type: T-Cell Lymphoma (Systemic) Disease Type: Not Applicable Line of Therapy: First Line T-Cell Lymphoma Subtype: ALCL (Anaplastic Large Cell Lymphoma) CD30 Status: CD30 Positive Intent of Therapy: Curative Intent, Discussed with Patient 

## 2020-08-10 ENCOUNTER — Telehealth: Payer: Self-pay | Admitting: Hematology and Oncology

## 2020-08-10 ENCOUNTER — Encounter: Payer: Self-pay | Admitting: Hematology and Oncology

## 2020-08-10 NOTE — Telephone Encounter (Signed)
Called Maurice Fox to discuss the results of his PET CT scan.  Findings are most consistent with a stage IV T-cell lymphoma.  We are currently planning to start treatment next week.  The patient voiced his understanding of the results of the scan and the plan moving forward.  Ledell Peoples, MD Department of Hematology/Oncology [place] at [facility] Phone: [phone] Pager: [phone] Email: [email or web address]

## 2020-08-13 ENCOUNTER — Telehealth: Payer: Self-pay | Admitting: Hematology and Oncology

## 2020-08-13 NOTE — Telephone Encounter (Signed)
Scheduled appointment per 07/25 sch msg. Left message. 

## 2020-08-14 ENCOUNTER — Encounter: Payer: Self-pay | Admitting: Hematology and Oncology

## 2020-08-15 ENCOUNTER — Inpatient Hospital Stay: Payer: Medicare HMO

## 2020-08-15 ENCOUNTER — Other Ambulatory Visit: Payer: Self-pay | Admitting: Hematology and Oncology

## 2020-08-15 ENCOUNTER — Inpatient Hospital Stay (HOSPITAL_BASED_OUTPATIENT_CLINIC_OR_DEPARTMENT_OTHER): Payer: Medicare HMO | Admitting: Hematology and Oncology

## 2020-08-15 ENCOUNTER — Other Ambulatory Visit: Payer: Self-pay

## 2020-08-15 ENCOUNTER — Encounter: Payer: Self-pay | Admitting: Hematology and Oncology

## 2020-08-15 VITALS — BP 121/76 | HR 117 | Temp 98.2°F | Resp 18 | Wt 209.0 lb

## 2020-08-15 VITALS — HR 112

## 2020-08-15 DIAGNOSIS — E785 Hyperlipidemia, unspecified: Secondary | ICD-10-CM | POA: Diagnosis not present

## 2020-08-15 DIAGNOSIS — Z5111 Encounter for antineoplastic chemotherapy: Secondary | ICD-10-CM | POA: Diagnosis not present

## 2020-08-15 DIAGNOSIS — Z5189 Encounter for other specified aftercare: Secondary | ICD-10-CM | POA: Diagnosis not present

## 2020-08-15 DIAGNOSIS — C8499 Mature T/NK-cell lymphomas, unspecified, extranodal and solid organ sites: Secondary | ICD-10-CM

## 2020-08-15 DIAGNOSIS — Z5112 Encounter for antineoplastic immunotherapy: Secondary | ICD-10-CM | POA: Diagnosis not present

## 2020-08-15 DIAGNOSIS — C8442 Peripheral T-cell lymphoma, not classified, intrathoracic lymph nodes: Secondary | ICD-10-CM

## 2020-08-15 DIAGNOSIS — I119 Hypertensive heart disease without heart failure: Secondary | ICD-10-CM | POA: Diagnosis not present

## 2020-08-15 DIAGNOSIS — F17210 Nicotine dependence, cigarettes, uncomplicated: Secondary | ICD-10-CM | POA: Diagnosis not present

## 2020-08-15 DIAGNOSIS — E119 Type 2 diabetes mellitus without complications: Secondary | ICD-10-CM | POA: Diagnosis not present

## 2020-08-15 DIAGNOSIS — G4733 Obstructive sleep apnea (adult) (pediatric): Secondary | ICD-10-CM | POA: Diagnosis not present

## 2020-08-15 LAB — CMP (CANCER CENTER ONLY)
ALT: 328 U/L (ref 0–44)
AST: 286 U/L (ref 15–41)
Albumin: 2.7 g/dL — ABNORMAL LOW (ref 3.5–5.0)
Alkaline Phosphatase: 1038 U/L — ABNORMAL HIGH (ref 38–126)
Anion gap: 12 (ref 5–15)
BUN: 32 mg/dL — ABNORMAL HIGH (ref 8–23)
CO2: 26 mmol/L (ref 22–32)
Calcium: 10.7 mg/dL — ABNORMAL HIGH (ref 8.9–10.3)
Chloride: 101 mmol/L (ref 98–111)
Creatinine: 0.84 mg/dL (ref 0.61–1.24)
GFR, Estimated: >60 mL/min (ref >60)
Glucose, Bld: 158 mg/dL — ABNORMAL HIGH (ref 70–99)
Potassium: 4 mmol/L (ref 3.5–5.1)
Sodium: 139 mmol/L (ref 135–145)
Total Bilirubin: 1.8 mg/dL — ABNORMAL HIGH (ref 0.3–1.2)
Total Protein: 7.2 g/dL (ref 6.5–8.1)

## 2020-08-15 LAB — CBC WITH DIFFERENTIAL (CANCER CENTER ONLY)
Abs Immature Granulocytes: 0.06 10*3/uL (ref 0.00–0.07)
Basophils Absolute: 0.1 10*3/uL (ref 0.0–0.1)
Basophils Relative: 1 %
Eosinophils Absolute: 0.1 10*3/uL (ref 0.0–0.5)
Eosinophils Relative: 1 %
HCT: 43.2 % (ref 39.0–52.0)
Hemoglobin: 14.8 g/dL (ref 13.0–17.0)
Immature Granulocytes: 1 %
Lymphocytes Relative: 14 %
Lymphs Abs: 1 10*3/uL (ref 0.7–4.0)
MCH: 28.7 pg (ref 26.0–34.0)
MCHC: 34.3 g/dL (ref 30.0–36.0)
MCV: 83.7 fL (ref 80.0–100.0)
Monocytes Absolute: 0.9 10*3/uL (ref 0.1–1.0)
Monocytes Relative: 12 %
Neutro Abs: 5.1 10*3/uL (ref 1.7–7.7)
Neutrophils Relative %: 71 %
Platelet Count: 369 10*3/uL (ref 150–400)
RBC: 5.16 MIL/uL (ref 4.22–5.81)
RDW: 14.6 % (ref 11.5–15.5)
WBC Count: 7.2 10*3/uL (ref 4.0–10.5)
nRBC: 0 % (ref 0.0–0.2)

## 2020-08-15 LAB — LACTATE DEHYDROGENASE: LDH: 5040 U/L — ABNORMAL HIGH (ref 98–192)

## 2020-08-15 LAB — URIC ACID: Uric Acid, Serum: 6.5 mg/dL (ref 3.7–8.6)

## 2020-08-15 MED ORDER — PALONOSETRON HCL INJECTION 0.25 MG/5ML
INTRAVENOUS | Status: AC
  Filled 2020-08-15: qty 5

## 2020-08-15 MED ORDER — SODIUM CHLORIDE 0.9% FLUSH
10.0000 mL | INTRAVENOUS | Status: DC | PRN
  Administered 2020-08-15: 10 mL
  Filled 2020-08-15: qty 10

## 2020-08-15 MED ORDER — SODIUM CHLORIDE 0.9 % IV SOLN
10.0000 mg | Freq: Once | INTRAVENOUS | Status: AC
  Administered 2020-08-15: 10 mg via INTRAVENOUS
  Filled 2020-08-15: qty 10

## 2020-08-15 MED ORDER — PROCHLORPERAZINE MALEATE 10 MG PO TABS: 10.0000 mg | ORAL_TABLET | Freq: Four times a day (QID) | ORAL | 0 refills | Status: DC | PRN

## 2020-08-15 MED ORDER — HEPARIN SOD (PORK) LOCK FLUSH 100 UNIT/ML IV SOLN
500.0000 [IU] | Freq: Once | INTRAVENOUS | Status: AC | PRN
  Administered 2020-08-15: 500 [IU]
  Filled 2020-08-15: qty 5

## 2020-08-15 MED ORDER — BRENTUXIMAB VEDOTIN 50 MG IV SOLR
1.8000 mg/kg | Freq: Once | INTRAVENOUS | Status: AC
  Administered 2020-08-15: 170 mg via INTRAVENOUS
  Filled 2020-08-15: qty 34

## 2020-08-15 MED ORDER — PREDNISONE 20 MG PO TABS: 60.0000 mg | ORAL_TABLET | Freq: Every day | ORAL | 5 refills | Status: DC

## 2020-08-15 MED ORDER — SODIUM CHLORIDE 0.9 % IV SOLN
750.0000 mg/m2 | Freq: Once | INTRAVENOUS | Status: AC
  Administered 2020-08-15: 1620 mg via INTRAVENOUS
  Filled 2020-08-15: qty 81

## 2020-08-15 MED ORDER — SODIUM CHLORIDE 0.9 % IV SOLN
Freq: Once | INTRAVENOUS | Status: AC
  Filled 2020-08-15: qty 250

## 2020-08-15 MED ORDER — ONDANSETRON HCL 8 MG PO TABS: 8.0000 mg | ORAL_TABLET | Freq: Three times a day (TID) | ORAL | 0 refills | Status: DC | PRN

## 2020-08-15 MED ORDER — DOXORUBICIN HCL CHEMO IV INJECTION 2 MG/ML
50.0000 mg/m2 | Freq: Once | INTRAVENOUS | Status: AC
  Administered 2020-08-15: 108 mg via INTRAVENOUS
  Filled 2020-08-15: qty 54

## 2020-08-15 MED ORDER — ACETAMINOPHEN 325 MG PO TABS
650.0000 mg | ORAL_TABLET | Freq: Once | ORAL | Status: AC
  Administered 2020-08-15: 650 mg via ORAL

## 2020-08-15 MED ORDER — PALONOSETRON HCL INJECTION 0.25 MG/5ML
0.2500 mg | Freq: Once | INTRAVENOUS | Status: AC
  Administered 2020-08-15: 0.25 mg via INTRAVENOUS

## 2020-08-15 MED ORDER — ACETAMINOPHEN 325 MG PO TABS
ORAL_TABLET | ORAL | Status: AC
  Filled 2020-08-15: qty 2

## 2020-08-15 MED ORDER — FOSAPREPITANT DIMEGLUMINE INJECTION 150 MG
150.0000 mg | Freq: Once | INTRAVENOUS | Status: AC
  Administered 2020-08-15: 150 mg via INTRAVENOUS
  Filled 2020-08-15: qty 150

## 2020-08-15 MED ORDER — ALLOPURINOL 300 MG PO TABS: 300.0000 mg | ORAL_TABLET | Freq: Every day | ORAL | 1 refills | Status: DC

## 2020-08-15 MED ORDER — DIPHENHYDRAMINE HCL 50 MG/ML IJ SOLN
INTRAMUSCULAR | Status: AC
  Filled 2020-08-15: qty 1

## 2020-08-15 MED ORDER — LIDOCAINE-PRILOCAINE 2.5-2.5 % EX CREA: 1.0000 "application " | TOPICAL_CREAM | CUTANEOUS | 0 refills | Status: DC | PRN

## 2020-08-15 MED ORDER — DIPHENHYDRAMINE HCL 50 MG/ML IJ SOLN
50.0000 mg | Freq: Once | INTRAMUSCULAR | Status: AC
  Administered 2020-08-15: 50 mg via INTRAVENOUS

## 2020-08-15 NOTE — Patient Instructions (Signed)
[facility]  Discharge Instructions: Thank you for choosing [facility] to provide your oncology and hematology care.   If you have a lab appointment with the [facility], please go directly to the [facility] and check in at the registration area.   Wear comfortable clothing and clothing appropriate for easy access to any Portacath or PICC line.   We strive to give you quality time with your provider. You may need to reschedule your appointment if you arrive late (15 or more minutes).  Arriving late affects you and other patients whose appointments are after yours.  Also, if you miss three or more appointments without notifying the office, you may be dismissed from the clinic at the provider's discretion.      For prescription refill requests, have your pharmacy contact our office and allow 72 hours for refills to be completed.    Today you received the following chemotherapy and/or immunotherapy agents doxorubicin/cytoxan/adcetris      To help prevent nausea and vomiting after your treatment, we encourage you to take your nausea medication as directed.  BELOW ARE SYMPTOMS THAT SHOULD BE REPORTED IMMEDIATELY: *FEVER GREATER THAN 100.4 F (38 C) OR HIGHER *CHILLS OR SWEATING *NAUSEA AND VOMITING THAT IS NOT CONTROLLED WITH YOUR NAUSEA MEDICATION *UNUSUAL SHORTNESS OF BREATH *UNUSUAL BRUISING OR BLEEDING *URINARY PROBLEMS (pain or burning when urinating, or frequent urination) *BOWEL PROBLEMS (unusual diarrhea, constipation, pain near the anus) TENDERNESS IN MOUTH AND THROAT WITH OR WITHOUT PRESENCE OF ULCERS (sore throat, sores in mouth, or a toothache) UNUSUAL RASH, SWELLING OR PAIN  UNUSUAL VAGINAL DISCHARGE OR ITCHING   Items with * indicate a potential emergency and should be followed up as soon as possible or go to the Emergency Department if any problems should occur.  Please show the CHEMOTHERAPY ALERT CARD or IMMUNOTHERAPY ALERT  CARD at check-in to the Emergency Department and triage nurse.  Should you have questions after your visit or need to cancel or reschedule your appointment, please contact [facility]  Dept: [phone]  and follow the prompts.  Office hours are 8:00 a.m. to 4:30 p.m. Monday - Friday. Please note that voicemails left after 4:00 p.m. may not be returned until the following business day.  We are closed weekends and major holidays. You have access to a nurse at all times for urgent questions. Please call the main number to the clinic Dept: [phone] and follow the prompts.   For any non-urgent questions, you may also contact your provider using MyChart. We now offer e-Visits for anyone 33 and older to request care online for non-urgent symptoms. For details visit [email or web address].   Also download the MyChart app! Go to the app store, search "MyChart", open the app, select [facility], and log in with your MyChart username and password.  Due to Covid, a mask is required upon entering the hospital/clinic. If you do not have a mask, one will be given to you upon arrival. For doctor visits, patients may have 1 support person aged 37 or older with them. For treatment visits, patients cannot have anyone with them due to current Covid guidelines and our immunocompromised population.   Doxorubicin injection What is this medication? DOXORUBICIN (dox oh ROO bi sin) is a chemotherapy drug. It is used to treat many kinds of cancer like leukemia, lymphoma, neuroblastoma, sarcoma, and Wilms' tumor. It is also used to treat bladder cancer, breast cancer, lungcancer, ovarian cancer, stomach cancer, and  thyroid cancer. This medicine may be used for other purposes; ask your health care provider orpharmacist if you have questions. COMMON BRAND NAME(S): Adriamycin, Adriamycin PFS, Adriamycin RDF, Rubex What should I tell my care team before I take this medication? They need to know  if you have any of these conditions: heart disease history of low blood counts caused by a medicine liver disease recent or ongoing radiation therapy an unusual or allergic reaction to doxorubicin, other chemotherapy agents, other medicines, foods, dyes, or preservatives pregnant or trying to get pregnant breast-feeding How should I use this medication? This drug is given as an infusion into a vein. It is administered in a hospital or clinic by a specially trained health care professional. If you have pain, swelling, burning or any unusual feeling around the site of your injection,tell your health care professional right away. Talk to your pediatrician regarding the use of this medicine in children.Special care may be needed. Overdosage: If you think you have taken too much of this medicine contact apoison control center or emergency room at once. NOTE: This medicine is only for you. Do not share this medicine with others. What if I miss a dose? It is important not to miss your dose. Call your doctor or health careprofessional if you are unable to keep an appointment. What may interact with this medication? This medicine may interact with the following medications: 6-mercaptopurine paclitaxel phenytoin St. John's Wort trastuzumab verapamil This list may not describe all possible interactions. Give your health care provider a list of all the medicines, herbs, non-prescription drugs, or dietary supplements you use. Also tell them if you smoke, drink alcohol, or use illegaldrugs. Some items may interact with your medicine. What should I watch for while using this medication? This drug may make you feel generally unwell. This is not uncommon, as chemotherapy can affect healthy cells as well as cancer cells. Report any side effects. Continue your course of treatment even though you feel ill unless yourdoctor tells you to stop. There is a maximum amount of this medicine you should receive  throughout your life. The amount depends on the medical condition being treated and your overall health. Your doctor will watch how much of this medicine you receive inyour lifetime. Tell your doctor if you have taken this medicine before. You may need blood work done while you are taking this medicine. Your urine may turn red for a few days after your dose. This is not blood. Ifyour urine is dark or brown, call your doctor. In some cases, you may be given additional medicines to help with side effects.Follow all directions for their use. Call your doctor or health care professional for advice if you get a fever, chills or sore throat, or other symptoms of a cold or flu. Do not treat yourself. This drug decreases your body's ability to fight infections. Try toavoid being around people who are sick. This medicine may increase your risk to bruise or bleed. Call your doctor orhealth care professional if you notice any unusual bleeding. Talk to your doctor about your risk of cancer. You may be more at risk forcertain types of cancers if you take this medicine. Do not become pregnant while taking this medicine or for 6 months after stopping it. Women should inform their doctor if they wish to become pregnant or think they might be pregnant. Men should not father a child while taking this medicine and for 6 months after stopping it. There is a potential for serious  side effects to an unborn child. Talk to your health care professional or pharmacist for more information. Do not breast-feed an infant while takingthis medicine. This medicine has caused ovarian failure in some women and reduced sperm counts in some men This medicine may interfere with the ability to have a child. Talk with your doctor or health care professional if you are concerned about yourfertility. This medicine may cause a decrease in Co-Enzyme Q-10. You should make sure that you get enough Co-Enzyme Q-10 while you are taking this medicine.  Discuss thefoods you eat and the vitamins you take with your health care professional. What side effects may I notice from receiving this medication? Side effects that you should report to your doctor or health care professionalas soon as possible: allergic reactions like skin rash, itching or hives, swelling of the face, lips, or tongue breathing problems chest pain fast or irregular heartbeat low blood counts - this medicine may decrease the number of white blood cells, red blood cells and platelets. You may be at increased risk for infections and bleeding. pain, redness, or irritation at site where injected signs of infection - fever or chills, cough, sore throat, pain or difficulty passing urine signs of decreased platelets or bleeding - bruising, pinpoint red spots on the skin, black, tarry stools, blood in the urine swelling of the ankles, feet, hands tiredness weakness Side effects that usually do not require medical attention (report to yourdoctor or health care professional if they continue or are bothersome): diarrhea hair loss mouth sores nail discoloration or damage nausea red colored urine vomiting This list may not describe all possible side effects. Call your doctor for medical advice about side effects. You may report side effects to FDA at1-800-FDA-1088. Where should I keep my medication? This drug is given in a hospital or clinic and will not be stored at home. NOTE: This sheet is a summary. It may not cover all possible information. If you have questions about this medicine, talk to your doctor, pharmacist, orhealth care provider.  2022 Elsevier/Gold Standard (2016-08-20 11:01:26)  Cyclophosphamide Injection What is this medication? CYCLOPHOSPHAMIDE (sye kloe FOSS fa mide) is a chemotherapy drug. It slows the growth of cancer cells. This medicine is used to treat many types of cancer like lymphoma, myeloma, leukemia, breast cancer, and ovarian cancer, to name  afew. This medicine may be used for other purposes; ask your health care provider orpharmacist if you have questions. COMMON BRAND NAME(S): Cytoxan, Neosar What should I tell my care team before I take this medication? They need to know if you have any of these conditions: heart disease history of irregular heartbeat infection kidney disease liver disease low blood counts, like white cells, platelets, or red blood cells on hemodialysis recent or ongoing radiation therapy scarring or thickening of the lungs trouble passing urine an unusual or allergic reaction to cyclophosphamide, other medicines, foods, dyes, or preservatives pregnant or trying to get pregnant breast-feeding How should I use this medication? This drug is usually given as an injection into a vein or muscle or by infusion into a vein. It is administered in a hospital or clinic by a specially trainedhealth care professional. Talk to your pediatrician regarding the use of this medicine in children.Special care may be needed. Overdosage: If you think you have taken too much of this medicine contact apoison control center or emergency room at once. NOTE: This medicine is only for you. Do not share this medicine with others. What if I miss a  dose? It is important not to miss your dose. Call your doctor or health careprofessional if you are unable to keep an appointment. What may interact with this medication? amphotericin B azathioprine certain antivirals for HIV or hepatitis certain medicines for blood pressure, heart disease, irregular heart beat certain medicines that treat or prevent blood clots like warfarin certain other medicines for cancer cyclosporine etanercept indomethacin medicines that relax muscles for surgery medicines to increase blood counts metronidazole This list may not describe all possible interactions. Give your health care provider a list of all the medicines, herbs, non-prescription drugs, or  dietary supplements you use. Also tell them if you smoke, drink alcohol, or use illegaldrugs. Some items may interact with your medicine. What should I watch for while using this medication? Your condition will be monitored carefully while you are receiving thismedicine. You may need blood work done while you are taking this medicine. Drink water or other fluids as directed. Urinate often, even at night. Some products may contain alcohol. Ask your health care professional if this medicine contains alcohol. Be sure to tell all health care professionals you are taking this medicine. Certain medicines, like metronidazole and disulfiram, can cause an unpleasant reaction when taken with alcohol. The reaction includes flushing, headache, nausea, vomiting, sweating, and increased thirst. Thereaction can last from 30 minutes to several hours. Do not become pregnant while taking this medicine or for 1 year after stopping it. Women should inform their health care professional if they wish to become pregnant or think they might be pregnant. Men should not father a child while taking this medicine and for 4 months after stopping it. There is potential for serious side effects to an unborn child. Talk to your health care professionalfor more information. Do not breast-feed an infant while taking this medicine or for 1 week afterstopping it. This medicine has caused ovarian failure in some women. This medicine may make it more difficult to get pregnant. Talk to your health care professional if [place] concerned about your fertility. This medicine has caused decreased sperm counts in some men. This may make it more difficult to father a child. Talk to your health care professional if [place] concerned about your fertility. Call your health care professional for advice if you get a fever, chills, or sore throat, or other symptoms of a cold or flu. Do not treat yourself. This medicine decreases your body's ability to fight  infections. Try to avoid beingaround people who are sick. Avoid taking medicines that contain aspirin, acetaminophen, ibuprofen, naproxen, or ketoprofen unless instructed by your health care professional.These medicines may hide a fever. Talk to your health care professional about your risk of cancer. You may bemore at risk for certain types of cancer if you take this medicine. If you are going to need surgery or other procedure, tell your health careprofessional that you are using this medicine. Be careful brushing or flossing your teeth or using a toothpick because you may get an infection or bleed more easily. If you have any dental work done, Primary school teacher you are receiving this medicine. What side effects may I notice from receiving this medication? Side effects that you should report to your doctor or health care professionalas soon as possible: allergic reactions like skin rash, itching or hives, swelling of the face, lips, or tongue breathing problems nausea, vomiting signs and symptoms of bleeding such as bloody or black, tarry stools; red or dark brown urine; spitting up blood or brown material that looks like  coffee grounds; red spots on the skin; unusual bruising or bleeding from the eyes, gums, or nose signs and symptoms of heart failure like fast, irregular heartbeat, sudden weight gain; swelling of the ankles, feet, hands signs and symptoms of infection like fever; chills; cough; sore throat; pain or trouble passing urine signs and symptoms of kidney injury like trouble passing urine or change in the amount of urine signs and symptoms of liver injury like dark yellow or brown urine; general ill feeling or flu-like symptoms; light-colored stools; loss of appetite; nausea; right upper belly pain; unusually weak or tired; yellowing of the eyes or skin Side effects that usually do not require medical attention (report to yourdoctor or health care professional if they continue or are  bothersome): confusion decreased hearing diarrhea facial flushing hair loss headache loss of appetite missed menstrual periods signs and symptoms of low red blood cells or anemia such as unusually weak or tired; feeling faint or lightheaded; falls skin discoloration This list may not describe all possible side effects. Call your doctor for medical advice about side effects. You may report side effects to FDA at1-800-FDA-1088. Where should I keep my medication? This drug is given in a hospital or clinic and will not be stored at home. NOTE: This sheet is a summary. It may not cover all possible information. If you have questions about this medicine, talk to your doctor, pharmacist, orhealth care provider.  2022 Elsevier/Gold Standard (2018-10-11 09:53:29)  Brentuximab vedotin solution for injection What is this medication? BRENTUXIMAB VEDOTIN (bren TUX see mab ve DOE tin) is a monoclonal antibody and a chemotherapy drug. It is used for treating Hodgkin lymphoma and certain non-Hodgkin lymphomas, such as anaplastic large-cell lymphoma, mycosisfungoides, and peripheral T-cell lymphoma. This medicine may be used for other purposes; ask your health care provider orpharmacist if you have questions. COMMON BRAND NAME(S): ADCETRIS What should I tell my care team before I take this medication? They need to know if you have any of these conditions: immune system problems infection (especially a virus infection such as chickenpox, cold sores, or herpes) kidney disease liver disease low blood counts, like low white cell, platelet, or red cell counts tingling of the fingers or toes, or other nerve disorder an unusual or allergic reaction to brentuximab vedotin, other medicines, foods, dyes, or preservatives pregnant or trying to get pregnant breast-feeding How should I use this medication? This medicine is for infusion into a vein. It is given by a health careprofessional in a hospital or  clinic setting. Talk to your pediatrician regarding the use of this medicine in children.Special care may be needed. Overdosage: If you think you have taken too much of this medicine contact apoison control center or emergency room at once. NOTE: This medicine is only for you. Do not share this medicine with others. What if I miss a dose? It is important not to miss your dose. Call your doctor or health careprofessional if you are unable to keep an appointment. What may interact with this medication? Do not take this medicine with any of the following medications: bleomycin This medicine may also interact with the following medications: ketoconazole rifampin St. John's wort; Hypericum perforatum This list may not describe all possible interactions. Give your health care provider a list of all the medicines, herbs, non-prescription drugs, or dietary supplements you use. Also tell them if you smoke, drink alcohol, or use illegaldrugs. Some items may interact with your medicine. What should I watch for while using this medication?  Visit your doctor for checks on your progress. This drug may make you feel generally unwell. Report any side effects. Continue your course of treatmenteven though you feel ill unless your doctor tells you to stop. Call your doctor or health care professional for advice if you get a fever, chills or sore throat, or other symptoms of a cold or flu. Do not treat yourself. This drug decreases your body's ability to fight infections. Try toavoid being around people who are sick. This medicine may increase your risk to bruise or bleed. Call your doctor orhealth care professional if you notice any unusual bleeding. In some patients, this medicine may cause a serious brain infection that may cause death. If you have any problems seeing, thinking, speaking, walking, or standing, tell your doctor right away. If you cannot reach your doctor,urgently seek other source of medical  care. Do not become pregnant while taking this medicine or for 6 months after stopping it. Women should inform their doctor if they wish to become pregnant or think they might be pregnant. Men should not father a child while taking this medicine and for 6 months after stopping it. There is a potential for serious side effects to an unborn child. Talk to your health care professional or pharmacist for more information. Do not breast-feed an infant while takingthis medicine. This may interfere with the ability to father a child. You should talk to yourdoctor or health care professional if you are concerned about your fertility. What side effects may I notice from receiving this medication? Side effects that you should report to your doctor or health care professionalas soon as possible: allergic reactions like skin rash, itching or hives, swelling of the face, lips, or tongue changes in emotions or moods diarrhea low blood counts - this medicine may decrease the number of white blood cells, red blood cells and platelets. You may be at increased risk for infections and bleeding. pain, tingling, numbness in the hands or feet redness, blistering, peeling or loosening of the skin, including inside the mouth shortness of breath signs of infection - fever or chills, cough, sore throat, pain or difficulty passing urine signs of decreased platelets or bleeding - bruising, pinpoint red spots on the skin, black, tarry stools, blood in the urine signs of decreased red blood cells - unusually weak or tired, fainting spells, lightheadedness signs of liver injury like dark yellow or brown urine; general ill feeling or flu-like symptoms; light-colored stools; loss of appetite; nausea; right upper belly pain; yellowing of the eyes or skin stomach pain sudden numbness or weakness of the face, arm or leg vomiting Side effects that usually do not require medical attention (report to yourdoctor or health care  professional if they continue or are bothersome): constipation dizziness headache muscle pain tiredness This list may not describe all possible side effects. Call your doctor for medical advice about side effects. You may report side effects to FDA at1-800-FDA-1088. Where should I keep my medication? This drug is given in a hospital or clinic and will not be stored at home. NOTE: This sheet is a summary. It may not cover all possible information. If you have questions about this medicine, talk to your doctor, pharmacist, orhealth care provider.  2022 Elsevier/Gold Standard (2018-12-06 20:04:26)

## 2020-08-15 NOTE — Progress Notes (Signed)
Continue Doxorubicin and Adcetris at full dose.   Raul Del [place], [place], BCPS, BCOP 08/15/2020 1:55 PM

## 2020-08-15 NOTE — Progress Notes (Signed)
Met with patient/accompanying adults to introduce myself as Arboriculturist and to offer available resources.  Discussed one-time $1000 Radio broadcast assistant to assist with personal expenses while going through treatment.  Gave them my card if interested in applying and for any additional financial questions or concerns.

## 2020-08-15 NOTE — Progress Notes (Signed)
Per Dr.[name] ok to treat with elevated HR, elevated Liver enzymes, elevated bili., and current echo.

## 2020-08-16 ENCOUNTER — Encounter: Payer: Self-pay | Admitting: Hematology and Oncology

## 2020-08-16 ENCOUNTER — Telehealth: Payer: Self-pay | Admitting: Nutrition

## 2020-08-16 ENCOUNTER — Ambulatory Visit: Payer: Medicare HMO

## 2020-08-16 ENCOUNTER — Telehealth: Payer: Self-pay | Admitting: *Deleted

## 2020-08-16 NOTE — Telephone Encounter (Signed)
Patient identified to be at risk for malnutrition on the MST report secondary to poor appetite and weight loss.  Contacted patient by telephone who prefers to have an in person appointment.  Nutrition consult scheduled for Monday, August 8 at 3:15.

## 2020-08-16 NOTE — Progress Notes (Signed)
[place] Telephone:([phone]) [phone]   Fax:([phone]) [phone]  PROGRESS NOTE  Patient Care Team: Fanny Bien, MD as PCP - General (Family Medicine)  Hematological/Oncological History # CD30-Positive T-cell Lymphoma, Stage III/IV 07/09/2020: patient underwent cholecystectomy. Liver wedge biopsy during the procedure revealed involvement of a CD30-positive T-cell lymphoproliferative disorder 07/25/2020: establish care with Dr. Lorenso Courier  08/08/2020: PET CT scan showed innumerable hypermetabolic lesions (primarily Deauville 4) in the liver and skeleton. Left neck adenopathy including a dominant level IB lymph node which is Deauville 5. 08/15/2020: Cycle 1 Day 1 of BV-CHP   Interval History:  Maurice Fox 70 y.o. male with medical history significant for T cell lymphoma (Stage III/IV) presents for a follow up visit. The patient's last visit was on 07/25/2020 at which time he established care. In the interim since the last visit he has completed chemotherapy education, had a port placed, and had a PET CT scan which confirmed stage III/IV disease.  On exam today Maurice Fox is accompanied by his son.  He reports that he has "no energy".  He notes that he has been having some episodes of sweats in the morning.  He fortunately otherwise denies fevers or chills.  He is also had no nausea, vomiting, or diarrhea.  He denies being in any pain at this time.  His weight has dropped 4 pounds in the interim since her last visit.  The bulk of our discussion today focused on the treatment plan, supportive medications, and the long-term prognosis given the findings of the PET CT scan.  The patient and son voiced their understanding and his conversation is detailed below.  MEDICAL HISTORY:  Past Medical History:  Diagnosis Date   Arthritis    Hyperlipidemia    Hypertension    followed by pcp  (11-23-2019  per pt had stress test greater than 20 yrs ago, told ok)   Nocturia    OSA on CPAP     per pt uses nightly   Phimosis    Type 2 diabetes mellitus ([place])    followed by pcp   (11-23-2019 per pt does not check blood sugar at home)   Wears glasses     SURGICAL HISTORY: Past Surgical History:  Procedure Laterality Date   APPENDECTOMY  child   CHOLECYSTECTOMY N/A 07/09/2020   Procedure: LAPAROSCOPIC CHOLECYSTECTOMY WITH INTRAOPERATIVE CHOLANGIOGRAM,;  Surgeon: Greer Pickerel, MD;  Location: [facility];  Service: General;  Laterality: N/A;   CIRCUMCISION N/A 11/29/2019   Procedure: CIRCUMCISION ADULT;  Surgeon: [name], MD;  Location: [facility];  Service: Urology;  Laterality: N/A;   IR IMAGING GUIDED PORT INSERTION  08/06/2020   LIVER BIOPSY N/A 07/09/2020   Procedure: LAP LIVER BIOPSY;  Surgeon: Greer Pickerel, MD;  Location: [facility];  Service: General;  Laterality: N/A;   ROTATOR CUFF REPAIR Left chld   SUPRA-UMBILICAL HERNIA N/A 05/23/5463   Procedure: PRIMARY REPAIR SUPRA-UMBILICAL HERNIA;  Surgeon: Greer Pickerel, MD;  Location: [facility];  Service: General;  Laterality: N/A;    SOCIAL HISTORY: Social History   Socioeconomic History   Marital status: Divorced    Spouse name: Not on file   Number of children: Not on file   Years of education: Not on file   Highest education level: Not on file  Occupational History   Not on file  Tobacco Use   Smoking status: Every Day    Packs/day: 1.00    Years: 49.00    Pack years: 49.00  Types: Cigars, Cigarettes   Smokeless tobacco: Never  Vaping Use   Vaping Use: Never used  Substance and Sexual Activity   Alcohol use: Not Currently   Drug use: Never   Sexual activity: Not on file  Other Topics Concern   Not on file  Social History Narrative   Not on file   Social Determinants of Health   Financial Resource Strain: Not on file  Food Insecurity: Not on file  Transportation Needs: Not on file  Physical Activity: Not on file  Stress: Not on file  Social Connections: Not on file  Intimate Partner  Violence: Not on file    FAMILY HISTORY: Family History  Problem Relation Age of Onset   Hypotension Mother    Diabetes Mellitus II Father    Prostate cancer Brother    Prostate cancer Brother     ALLERGIES:  has No Known Allergies.  MEDICATIONS:  Current Outpatient Medications  Medication Sig Dispense Refill   allopurinol (ZYLOPRIM) 300 MG tablet Take 1 tablet (300 mg total) by mouth daily. 90 tablet 1   lidocaine-prilocaine (EMLA) cream Apply 1 application topically as needed. 30 g 0   ondansetron (ZOFRAN) 8 MG tablet Take 1 tablet (8 mg total) by mouth every 8 (eight) hours as needed for nausea or vomiting. 30 tablet 0   predniSONE (DELTASONE) 20 MG tablet Take 3 tablets (60 mg total) by mouth daily with breakfast. 15 tablet 5   prochlorperazine (COMPAZINE) 10 MG tablet Take 1 tablet (10 mg total) by mouth every 6 (six) hours as needed for nausea or vomiting. 30 tablet 0   amLODipine (NORVASC) 10 MG tablet Take 10 mg by mouth daily.     aspirin 81 MG EC tablet Take 81 mg by mouth at bedtime.     diclofenac Sodium (VOLTAREN) 1 % GEL Apply 2 g topically 4 (four) times daily as needed for pain.     Empagliflozin-metFORMIN HCl ER 25-1000 MG TB24 Take 1 tablet by mouth at bedtime.     fluticasone (FLONASE) 50 MCG/ACT nasal spray Place 2 sprays into both nostrils in the morning and at bedtime.     methocarbamol (ROBAXIN) 500 MG tablet Take 1 tablet (500 mg total) by mouth every 8 (eight) hours as needed for muscle spasms. 30 tablet 0   Multiple Vitamins-Minerals (CENTRUM SILVER PO) Take 1 tablet by mouth at bedtime.     Propylene Glycol (SYSTANE BALANCE OP) Place 1 drop into both eyes 2 (two) times daily.     rosuvastatin (CRESTOR) 5 MG tablet Take 5 mg by mouth at bedtime.     sildenafil (VIAGRA) 100 MG tablet Take 100 mg by mouth daily as needed (ED).     tamsulosin (FLOMAX) 0.4 MG CAPS capsule Take 0.4 mg by mouth at bedtime.     No current facility-administered medications for this  visit.    REVIEW OF SYSTEMS:   Constitutional: ( - ) fevers, ( - )  chills , ( - ) night sweats Eyes: ( - ) blurriness of vision, ( - ) double vision, ( - ) watery eyes Ears, nose, mouth, throat, and face: ( - ) mucositis, ( - ) sore throat Respiratory: ( - ) cough, ( - ) dyspnea, ( - ) wheezes Cardiovascular: ( - ) palpitation, ( - ) chest discomfort, ( - ) lower extremity swelling Gastrointestinal:  ( - ) nausea, ( - ) heartburn, ( - ) change in bowel habits Skin: ( - ) abnormal skin rashes  Lymphatics: ( - ) new lymphadenopathy, ( - ) easy bruising Neurological: ( - ) numbness, ( - ) tingling, ( - ) new weaknesses Behavioral/Psych: ( - ) mood change, ( - ) new changes  All other systems were reviewed with the patient and are negative.  PHYSICAL EXAMINATION: ECOG PERFORMANCE STATUS: 1 - Symptomatic but completely ambulatory  Vitals:   08/15/20 1051  BP: 121/76  Pulse: (!) 117  Resp: 18  Temp: 98.2 F (36.8 C)  SpO2: 100%   Filed Weights   08/15/20 1051  Weight: 209 lb (94.8 kg)    GENERAL: Well-appearing elderly African-American male, alert, no distress and comfortable SKIN: skin color, texture, turgor are normal, no rashes or significant lesions EYES: conjunctiva are pink and non-injected, sclera clear NECK: supple, non-tender LYMPH: Large palpable left submandibular lymph node (approximately 3 cm diameter), otherwise no palpable lymphadenopathy in the cervical, axillary or inguinal LUNGS: clear to auscultation and percussion with normal breathing effort HEART: regular rate & rhythm and no murmurs and no lower extremity edema PSYCH: alert & oriented x 3, fluent speech NEURO: no focal motor/sensory deficits  LABORATORY DATA:  I have reviewed the data as listed CBC Latest Ref Rng & Units 08/15/2020 08/02/2020 07/25/2020  WBC 4.0 - 10.5 K/uL 7.2 4.9 6.5  Hemoglobin 13.0 - 17.0 g/dL 14.8 13.5 15.2  Hematocrit 39.0 - 52.0 % 43.2 41.8 45.6  Platelets 150 - 400 K/uL 369 265  315    CMP Latest Ref Rng & Units 08/15/2020 08/02/2020 07/25/2020  Glucose 70 - 99 mg/dL 158(H) 95 105(H)  BUN 8 - 23 mg/dL 32(H) 18 31(H)  Creatinine 0.61 - 1.24 mg/dL 0.84 0.58(L) 0.91  Sodium 135 - 145 mmol/L 139 141 137  Potassium 3.5 - 5.1 mmol/L 4.0 3.7 4.0  Chloride 98 - 111 mmol/L 101 108 102  CO2 22 - 32 mmol/L 26 24 19(L)  Calcium 8.9 - 10.3 mg/dL 10.7(H) 9.4 10.4(H)  Total Protein 6.5 - 8.1 g/dL 7.2 6.6 7.7  Total Bilirubin 0.3 - 1.2 mg/dL 1.8(H) 0.8 0.7  Alkaline Phos 38 - 126 U/L 1,038(H) 532(H) 319(H)  AST 15 - 41 U/L 286(HH) 250(H) 85(H)  ALT 0 - 44 U/L 328(HH) 284(H) 102(H)   RADIOGRAPHIC STUDIES: I have personally reviewed the radiological images as listed and agreed with the findings in the report: Large PET avid left mandibular lymph node as well as extensive evolvement of the skeleton.  Liver also has several areas of FDG avidity.  Consistent with stage III or IV disease. CT ABDOMEN PELVIS WO CONTRAST  Result Date: 08/02/2020 CLINICAL DATA:  70 year old male with history of acute onset of nonlocalized abdominal pain and bilateral flank pain. Elevated liver function tests. EXAM: CT ABDOMEN AND PELVIS WITHOUT CONTRAST TECHNIQUE: Multidetector CT imaging of the abdomen and pelvis was performed following the standard protocol without IV contrast. COMPARISON:  CT the abdomen and pelvis 07/07/2020 FINDINGS: Lower chest: Linear areas of scarring are noted in the lower lobes of the lungs bilaterally. Atherosclerotic calcifications in the descending thoracic aorta as well as the right coronary artery. Hepatobiliary: Diffuse low attenuation throughout the hepatic parenchyma, indicative of a background of hepatic steatosis. Liver has a shrunken appearance and very nodular contour, indicative of underlying cirrhosis. No discrete cystic or solid hepatic lesions are confidently identified on today's noncontrast CT examination, although the parenchyma does appear diffusely heterogeneous,  suggesting a background of regenerative of nodules in the setting of cirrhosis. Status post cholecystectomy. Pancreas: No pancreatic mass. No  pancreatic ductal dilatation. No pancreatic or peripancreatic fluid collections or inflammatory changes. Spleen: Unremarkable. Adrenals/Urinary Tract: Multiple small renal lesions bilaterally ranging from low to high attenuation, incompletely characterized on today's non-contrast CT examination, but likely to represent a combination of simple cysts and proteinaceous/hemorrhagic cysts. No calcifications are identified within the collecting system of either kidney, along the course of either ureter, or within the lumen of the urinary bladder. No hydroureteronephrosis. Unenhanced appearance of the urinary bladder is normal. Right adrenal gland is normal in appearance. 1.6 x 1.2 cm intermediate attenuation (27 HU) left adrenal nodule, stable compared to the prior study. Stomach/Bowel: Unenhanced appearance of the stomach is normal. No pathologic dilatation of small bowel or colon. The appendix is not confidently identified and may be surgically absent. Regardless, there are no inflammatory changes noted adjacent to the cecum to suggest the presence of an acute appendicitis at this time. Vascular/Lymphatic: Aortic atherosclerosis. No lymphadenopathy noted in the abdomen or pelvis. Reproductive: Prostate gland and seminal vesicles are unremarkable in appearance. Other: No significant volume of ascites.  No pneumoperitoneum. Musculoskeletal: There are no aggressive appearing lytic or blastic lesions noted in the visualized portions of the skeleton. IMPRESSION: 1. No acute findings are noted in the abdomen or pelvis to account for the patient's symptoms. 2. Morphologic changes in the liver indicative of cirrhosis, as well as a background of hepatic steatosis. 3. Status post cholecystectomy since the prior study. 4. Multiple small renal lesions bilaterally, incompletely characterize,  but favored to represent a combination of small cysts and proteinaceous/hemorrhagic cysts. There is also an indeterminate left adrenal nodule which is stable. These findings could be definitively characterized with follow-up nonemergent outpatient MRI of the abdomen with and without IV gadolinium if of clinical concern. 5. Aortic atherosclerosis, in addition to least right coronary artery disease. Please note that although the presence of coronary artery calcium documents the presence of coronary artery disease, the severity of this disease and any potential stenosis cannot be assessed on this non-gated CT examination. Assessment for potential risk factor modification, dietary therapy or pharmacologic therapy may be warranted, if clinically indicated. Electronically Signed   By: Vinnie Langton M.D.   On: 08/02/2020 05:37   NM PET Image Initial (PI) Skull Base To Thigh  Result Date: 08/09/2020 CLINICAL DATA:  Initial treatment strategy for T-cell lymphoma. EXAM: NUCLEAR MEDICINE PET SKULL BASE TO THIGH TECHNIQUE: 10.3 mCi F-18 FDG was injected intravenously. Full-ring PET imaging was performed from the skull base to thigh after the radiotracer. CT data was obtained and used for attenuation correction and anatomic localization. Fasting blood glucose: 127 mg/dl COMPARISON:  CT abdomen 08/02/2020 and CT chest from 04/12/2020 FINDINGS: Mediastinal blood pool activity: SUV max 1.8 Liver activity: SUV max 4.2 (low accuracy in determining normal liver activity due to multinodular hyperactivity throughout the liver diffusely) NECK: Left level IB lymph node 3.4 cm in short axis on image 37 series 4, maximum SUV 8.5, Deauville 5. Left level IIa lymph node measuring about 0.8 cm in short axis on image 29 series 4, maximum SUV 8.7, Deauville 5. Left level V lymph node measuring 0.8 cm in short axis on image 38 series 4, maximum SUV 4.4, Deauville 4. Incidental CT findings: Bilateral common carotid atherosclerotic  calcification. Mildly prominent thyroid gland with small hypodense nodules measuring up to 1.0 cm in long axis. Not clinically significant; no follow-up imaging recommended (ref: J Am Coll Radiol. 2015 Feb;12(2): 143-50). CHEST: Right pericardial node 0.9 cm in short axis on image  91 series 4, maximum SUV 4.3, Deauville 4. A 0.4 cm right axillary node on image 65 series 4 has a maximum SUV of 2.8, Deauville 3. Incidental CT findings: Right Port-A-Cath tip: SVC. Coronary, aortic arch, and branch vessel atherosclerotic vascular disease. Mild atelectasis in both lower lobes. 6 by 4 mm right middle lobe pulmonary nodule on image 46 series 8, not well seen on CT chest from 04/12/2020 although relevant region is blurred by motion artifact on that exam. This lesion is not appreciably hypermetabolic but is below sensitive PET-CT size thresholds. ABDOMEN/PELVIS: Hypermetabolic nodularity scattered throughout the liver with a nodular margin of the liver. This nodular margin is readily apparent today but was not readily seen on 04/12/2020. 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 Index nodule most of the hypermetabolic foci measure at or less than 2.6 cm in long axis. A 2.6 cm in long axis lesion inferiorly in the lateral segment left hepatic lobe (segment 3) has a maximum SUV of 7.9, Deauville 4. No splenomegaly or focal splenic lesion. The patient has renal lesions of varying complexity on the CT data as shown on prior MRI, these are compatible with simple and complex cysts. No obviously hypermetabolic renal lesion is observed. No focal testicular lesion. Accentuated metabolic activity in portions of the bowel particularly the ascending colon, without appreciable associated CT abnormality, probably physiologic. Accentuated activity along a small umbilical hernia where there is some inflammatory stranding as on image  147 series 4, maximum SUV 2.7, likely inflammatory in nature. Incidental CT findings: Aortoiliac atherosclerotic vascular disease. Suspected lipoma at the junction of the left operator externus and left adductor magnus measuring 6.0 by 3.0 by 5.0 cm, with some speckled internal calcification but no hypermetabolic activity. SKELETON: Widespread multifocal hypermetabolic involvement of the visualized axial and appendicular skeleton with innumerable focal lesions scattered in the bony pelvis, vertebra, ribs, proximal femurs, proximal humerus bilaterally, scapula, and sternum. Index lesion in the right upper sternal manubrium with maximum SUV 8.3, Deauville 4 (borderline for Deauville 5). Index lesion in the L5 vertebral body with maximum SUV 7.6, Deauville 4. The innumerable bony lesions are either occult or demonstrates subtle lucency on the CT data. Incidental CT findings: Thoracic spondylosis with multilevel anterior bridging spurring. IMPRESSION: 1. Widespread innumerable hypermetabolic lesions (primarily Deauville 4) in the liver and skeleton. Left neck adenopathy including a dominant level IB lymph node which is Deauville 5. Small hypermetabolic right pericardial lymph node and very small Deauville 3 right axillary lymph node. The liver lesions cause a pseudo cirrhosis appearance. 2. No splenic involvement. 3. Approximately 0.5 cm right middle lobe nodule, not well seen on prior chest CT of 04/12/2020 although the relevant region is blurred by motion artifact on that exam. Not appreciably hypermetabolic but below sensitive PET-CT size thresholds. Surveillance may be warranted. 4. Other imaging findings of potential clinical significance: Other imaging findings of potential clinical significance: Aortic Atherosclerosis (ICD10-I70.0). Coronary atherosclerosis. Mild bibasilar atelectasis. Bilateral renal lesions of varying complexity but shown to be benign cysts on 05/05/2020 MRI. Lipoma along the left hip adductor  musculature. Thoracic spondylosis. Electronically Signed   By: Van Clines M.D.   On: 08/09/2020 09:15   DG Chest Port 1 View  Result Date: 08/02/2020 CLINICAL DATA:  70 year old male with chest pain. EXAM: PORTABLE CHEST 1 VIEW COMPARISON:  Chest radiograph dated 07/08/2018 FINDINGS: There is cardiomegaly with mild vascular congestion. No focal consolidation,  pleural effusion, or pneumothorax. Atherosclerotic calcification of the aortic arch. Degenerative changes of the spine. No acute osseous pathology. IMPRESSION: Cardiomegaly with mild vascular congestion. No focal consolidation. Electronically Signed   By: Anner Crete M.D.   On: 08/02/2020 01:46   IR IMAGING GUIDED PORT INSERTION  Result Date: 08/06/2020 CLINICAL DATA:  T-cell lymphoproliferative disorder/lymphoma and need for porta cath. EXAM: IMPLANTED PORT A CATH PLACEMENT WITH ULTRASOUND AND FLUOROSCOPIC GUIDANCE ANESTHESIA/SEDATION: 2.0 mg IV Versed; 100 mcg IV Fentanyl Total Moderate Sedation Time:  36 minutes The patient's level of consciousness and physiologic status were continuously monitored during the procedure by Radiology nursing. FLUOROSCOPY TIME:  36 seconds.  10.0 mGy. PROCEDURE: The procedure, risks, benefits, and alternatives were explained to the patient. Questions regarding the procedure were encouraged and answered. The patient understands and consents to the procedure. A time-out was performed prior to initiating the procedure. Ultrasound was utilized to confirm patency of the right internal jugular vein. The right neck and chest were prepped with chlorhexidine in a sterile fashion, and a sterile drape was applied covering the operative field. Maximum barrier sterile technique with sterile gowns and gloves were used for the procedure. Local anesthesia was provided with 1% lidocaine. After creating a small venotomy incision, a 21 gauge needle was advanced into the right internal jugular vein under direct, real-time  ultrasound guidance. Ultrasound image documentation was performed. After securing guidewire access, an 8 Fr dilator was placed. A J-wire was kinked to measure appropriate catheter length. A subcutaneous port pocket was then created along the upper chest wall utilizing sharp and blunt dissection. Portable cautery was utilized. The pocket was irrigated with sterile saline. A single lumen power injectable port was chosen for placement. The 8 Fr catheter was tunneled from the port pocket site to the venotomy incision. The port was placed in the pocket. External catheter was trimmed to appropriate length based on guidewire measurement. At the venotomy, an 8 Fr peel-away sheath was placed over a guidewire. The catheter was then placed through the sheath and the sheath removed. Final catheter positioning was confirmed and documented with a fluoroscopic spot image. The port was accessed with a needle and aspirated and flushed with heparinized saline. The access needle was removed. The venotomy and port pocket incisions were closed with subcutaneous 3-0 Monocryl and subcuticular 4-0 Vicryl. Dermabond was applied to both incisions. COMPLICATIONS: COMPLICATIONS None FINDINGS: After catheter placement, the tip lies at the cavo-atrial junction. The catheter aspirates normally and is ready for immediate use. IMPRESSION: Placement of single lumen port a cath via right internal jugular vein. The catheter tip lies at the cavo-atrial junction. A power injectable port a cath was placed and is ready for immediate use. Electronically Signed   By: Aletta Edouard M.D.   On: 08/06/2020 16:52    ASSESSMENT & PLAN Maurice Fox 70 y.o. male with medical history significant for T cell lymphoma (Stage III/IV) presents for a follow up visit.  After review of the labs, review of the records, and discussion with the patient the patients findings are most consistent with a peripheral CD30 positive T-cell lymphoma.  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  The patient has been staged with a PET CT scan which showed clear involvement of the skeleton and liver, most consistent with stage IV disease.  Given these findings we will plan for 6 cycles of BV CHP chemotherapy.  The regimen of BV CHP consists of rituximab 1.8 mg/kg  IV on day 1, cyclophosphamide 750 mg per metered squared on day 1, doxorubicin 50 mg per metered squared on day 1, and prednisone 60 mg p.o. day 1 through 5.  This is to be continued every 21 days with a plan for up to 6 cycles.  After the third or fourth cycle we will consider restaging PET CT scan in order to evaluate for response.  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 --TTE performed 07/08/2020, shows EF of 50-55% --negative HIV, Hep B and C serologies. Plan:  --will monitor CMP, CBC, LDH, and uric acid. Initially this will be weekly. Plan for q weekly for the first cycle --GCSF support on Day 3 of each cycle.  --RTC in 3 weeks for Cycle 2 Day 1 of treatment.   #Supportive Care -- chemotherapy education complete -- port placed .  -- zofran 82m q8H PRN and compazine 161mPO q6H for nausea -- allopurinol 30076mO daily for TLS prophylaxis -- EMLA cream for port -- no pain medication required at this time.    No orders of the defined types were placed in this encounter.  All questions were answered. The patient knows to call the clinic with any problems, questions or concerns.  A total of more than 30 minutes were spent on this encounter with face-to-face time and non-face-to-face time, including preparing to see the patient, ordering tests and/or medications, counseling the patient and coordination of care as outlined above.   JohLedell PeoplesD Department of Hematology/Oncology [place] [facility]: [phone]: [phone]: [email or web address]  08/16/2020 11:33 AM

## 2020-08-16 NOTE — Telephone Encounter (Signed)
Received call from pt's daughter -in-law, Olive Bass. She states that the pt gotten his medications a bit messed up and took 3 allopurinol this morning and 1 prednisone, instead of  3 prednisone and 1 allopurinol. Discussed with Dr, Lorenso Courier and advised that pt hold the next 2 days of allopurinol and to take the other 2 prednisone. Advised to make sure he takes all 5 days of the prednisone. Kandi voiced understanding and will make sure the pt takes his medicine correctly. Advised that he should not experience any ill effects from the allopurinol.

## 2020-08-17 ENCOUNTER — Telehealth: Payer: Self-pay | Admitting: Hematology and Oncology

## 2020-08-17 ENCOUNTER — Inpatient Hospital Stay: Payer: Medicare HMO

## 2020-08-17 ENCOUNTER — Other Ambulatory Visit: Payer: Self-pay

## 2020-08-17 ENCOUNTER — Encounter: Payer: Self-pay | Admitting: Physician Assistant

## 2020-08-17 VITALS — BP 158/75 | HR 110 | Temp 98.2°F | Resp 18

## 2020-08-17 DIAGNOSIS — G4733 Obstructive sleep apnea (adult) (pediatric): Secondary | ICD-10-CM | POA: Diagnosis not present

## 2020-08-17 DIAGNOSIS — C8499 Mature T/NK-cell lymphomas, unspecified, extranodal and solid organ sites: Secondary | ICD-10-CM | POA: Diagnosis not present

## 2020-08-17 DIAGNOSIS — F17210 Nicotine dependence, cigarettes, uncomplicated: Secondary | ICD-10-CM | POA: Diagnosis not present

## 2020-08-17 DIAGNOSIS — E119 Type 2 diabetes mellitus without complications: Secondary | ICD-10-CM | POA: Diagnosis not present

## 2020-08-17 DIAGNOSIS — Z5112 Encounter for antineoplastic immunotherapy: Secondary | ICD-10-CM | POA: Diagnosis not present

## 2020-08-17 DIAGNOSIS — E785 Hyperlipidemia, unspecified: Secondary | ICD-10-CM | POA: Diagnosis not present

## 2020-08-17 DIAGNOSIS — Z5111 Encounter for antineoplastic chemotherapy: Secondary | ICD-10-CM | POA: Diagnosis not present

## 2020-08-17 DIAGNOSIS — Z5189 Encounter for other specified aftercare: Secondary | ICD-10-CM | POA: Diagnosis not present

## 2020-08-17 DIAGNOSIS — I119 Hypertensive heart disease without heart failure: Secondary | ICD-10-CM | POA: Diagnosis not present

## 2020-08-17 DIAGNOSIS — C8442 Peripheral T-cell lymphoma, not classified, intrathoracic lymph nodes: Secondary | ICD-10-CM

## 2020-08-17 MED ORDER — PEGFILGRASTIM-CBQV 6 MG/0.6ML ~~LOC~~ SOSY
PREFILLED_SYRINGE | SUBCUTANEOUS | Status: AC
  Filled 2020-08-17: qty 0.6

## 2020-08-17 MED ORDER — PEGFILGRASTIM-CBQV 6 MG/0.6ML ~~LOC~~ SOSY
6.0000 mg | PREFILLED_SYRINGE | Freq: Once | SUBCUTANEOUS | Status: AC
  Administered 2020-08-17: 6 mg via SUBCUTANEOUS

## 2020-08-17 NOTE — Patient Instructions (Signed)

## 2020-08-17 NOTE — Progress Notes (Signed)
Met with patient at registration to complete grant paperwork.  Patient approved for one-time $1000 [employer] grant to assist with personal expenses while going through treatment. Discussed in detail expenses and how they are covered. Gave him a copy of the approval letter and expense sheet along with the Outpatient pharmacy information. He received a gift card today from the grant.  He has my card and paperwork in green folder for any additional financial questions or concerns.

## 2020-08-17 NOTE — Telephone Encounter (Signed)
Scheduled per los. Called and spoke with patient. Confirmed appt 

## 2020-08-22 ENCOUNTER — Other Ambulatory Visit: Payer: Self-pay

## 2020-08-22 ENCOUNTER — Encounter: Payer: Self-pay | Admitting: Hematology and Oncology

## 2020-08-22 ENCOUNTER — Inpatient Hospital Stay: Payer: Medicare HMO | Attending: Hematology and Oncology

## 2020-08-22 ENCOUNTER — Other Ambulatory Visit: Payer: Self-pay | Admitting: Hematology and Oncology

## 2020-08-22 DIAGNOSIS — Z5112 Encounter for antineoplastic immunotherapy: Secondary | ICD-10-CM | POA: Diagnosis not present

## 2020-08-22 DIAGNOSIS — Z7984 Long term (current) use of oral hypoglycemic drugs: Secondary | ICD-10-CM | POA: Diagnosis not present

## 2020-08-22 DIAGNOSIS — I10 Essential (primary) hypertension: Secondary | ICD-10-CM | POA: Diagnosis not present

## 2020-08-22 DIAGNOSIS — C8499 Mature T/NK-cell lymphomas, unspecified, extranodal and solid organ sites: Secondary | ICD-10-CM | POA: Insufficient documentation

## 2020-08-22 DIAGNOSIS — F17210 Nicotine dependence, cigarettes, uncomplicated: Secondary | ICD-10-CM | POA: Diagnosis not present

## 2020-08-22 DIAGNOSIS — E119 Type 2 diabetes mellitus without complications: Secondary | ICD-10-CM | POA: Diagnosis not present

## 2020-08-22 DIAGNOSIS — Z7982 Long term (current) use of aspirin: Secondary | ICD-10-CM | POA: Diagnosis not present

## 2020-08-22 DIAGNOSIS — Z5111 Encounter for antineoplastic chemotherapy: Secondary | ICD-10-CM | POA: Insufficient documentation

## 2020-08-22 DIAGNOSIS — C8442 Peripheral T-cell lymphoma, not classified, intrathoracic lymph nodes: Secondary | ICD-10-CM

## 2020-08-22 DIAGNOSIS — Z95828 Presence of other vascular implants and grafts: Secondary | ICD-10-CM | POA: Insufficient documentation

## 2020-08-22 DIAGNOSIS — Z5189 Encounter for other specified aftercare: Secondary | ICD-10-CM | POA: Diagnosis not present

## 2020-08-22 DIAGNOSIS — Z79899 Other long term (current) drug therapy: Secondary | ICD-10-CM | POA: Diagnosis not present

## 2020-08-22 LAB — CBC WITH DIFFERENTIAL (CANCER CENTER ONLY)
Abs Immature Granulocytes: 0.05 10*3/uL (ref 0.00–0.07)
Basophils Absolute: 0.1 10*3/uL (ref 0.0–0.1)
Basophils Relative: 3 %
Eosinophils Absolute: 0 10*3/uL (ref 0.0–0.5)
Eosinophils Relative: 1 %
HCT: 35.6 % — ABNORMAL LOW (ref 39.0–52.0)
Hemoglobin: 12.5 g/dL — ABNORMAL LOW (ref 13.0–17.0)
Immature Granulocytes: 2 %
Lymphocytes Relative: 16 %
Lymphs Abs: 0.4 10*3/uL — ABNORMAL LOW (ref 0.7–4.0)
MCH: 28.3 pg (ref 26.0–34.0)
MCHC: 35.1 g/dL (ref 30.0–36.0)
MCV: 80.5 fL (ref 80.0–100.0)
Monocytes Absolute: 0.1 10*3/uL (ref 0.1–1.0)
Monocytes Relative: 4 %
Neutro Abs: 1.9 10*3/uL (ref 1.7–7.7)
Neutrophils Relative %: 74 %
Platelet Count: 212 10*3/uL (ref 150–400)
RBC: 4.42 MIL/uL (ref 4.22–5.81)
RDW: 14.6 % (ref 11.5–15.5)
WBC Count: 2.6 10*3/uL — ABNORMAL LOW (ref 4.0–10.5)
nRBC: 0 % (ref 0.0–0.2)

## 2020-08-22 LAB — CMP (CANCER CENTER ONLY)
ALT: 156 U/L — ABNORMAL HIGH (ref 0–44)
AST: 122 U/L — ABNORMAL HIGH (ref 15–41)
Albumin: 2.8 g/dL — ABNORMAL LOW (ref 3.5–5.0)
Alkaline Phosphatase: 921 U/L — ABNORMAL HIGH (ref 38–126)
Anion gap: 10 (ref 5–15)
BUN: 35 mg/dL — ABNORMAL HIGH (ref 8–23)
CO2: 25 mmol/L (ref 22–32)
Calcium: 8.9 mg/dL (ref 8.9–10.3)
Chloride: 102 mmol/L (ref 98–111)
Creatinine: 0.84 mg/dL (ref 0.61–1.24)
GFR, Estimated: >60 mL/min (ref >60)
Glucose, Bld: 196 mg/dL — ABNORMAL HIGH (ref 70–99)
Potassium: 3.9 mmol/L (ref 3.5–5.1)
Sodium: 137 mmol/L (ref 135–145)
Total Bilirubin: 1.4 mg/dL — ABNORMAL HIGH (ref 0.3–1.2)
Total Protein: 6.5 g/dL (ref 6.5–8.1)

## 2020-08-22 LAB — LACTATE DEHYDROGENASE: LDH: 7555 U/L — ABNORMAL HIGH (ref 98–192)

## 2020-08-22 LAB — URIC ACID: Uric Acid, Serum: 5.7 mg/dL (ref 3.7–8.6)

## 2020-08-22 MED ORDER — HEPARIN SOD (PORK) LOCK FLUSH 100 UNIT/ML IV SOLN
500.0000 [IU] | Freq: Once | INTRAVENOUS | Status: AC
  Administered 2020-08-22: 500 [IU]
  Filled 2020-08-22: qty 5

## 2020-08-22 MED ORDER — SODIUM CHLORIDE 0.9% FLUSH
10.0000 mL | Freq: Once | INTRAVENOUS | Status: AC
Start: 2020-08-22 — End: 2020-08-22
  Administered 2020-08-22: 10 mL
  Filled 2020-08-22: qty 10

## 2020-08-27 ENCOUNTER — Inpatient Hospital Stay: Payer: Medicare HMO | Admitting: Nutrition

## 2020-08-27 ENCOUNTER — Other Ambulatory Visit: Payer: Self-pay

## 2020-08-27 NOTE — Progress Notes (Signed)
Patient was identified to be at risk for malnutrition on the MST secondary to weight loss and poor appetite.  Patient is a 70 year old male diagnosed with lymphoproliferative disorder/T-cell lymphoma.  He is followed by Dr. Lorenso Courier.  He is to receive 6 cycles of Brentuximab and Cyclophosphamide.  Past medical history includes hyperlipidemia, hypertension, diabetes type 2.  Medications include multivitamin, prednisone, Compazine, Crestor, Zofran.  Labs include glucose 196, BUN 35, albumin 2.5 on August 3.  Height: 5 feet 10 inches. Weight:197.4 lb. Usual body weight: 242 pounds November 29, 2019. BMI: 28.32 ECOG: 1.  Patient reports increased pain. States the main reason why he is not eating well is due to taste alterations.  He describes food tasting nasty and some foods tasting overly sweet. He thinks he may have developed lactose intolerance. He had one episode of diarrhea which improved after taking Imodium A-D. He denies other nutrition impact symptoms at this time. He is drinking Premier protein for additional calories and protein. He does not monitor his blood sugars.  Nutrition diagnosis: Unintended weight loss related to taste alterations as evidenced by 18% weight loss over 9 months.  Intervention: Educated on importance of increased calories and protein in small frequent meals and snacks. Continue no added sweets diet. Educated on strategies to improve taste alterations. Educated on low fiber diet if he develops diarrhea. Recommended Glucerna or [employer] 1.2 glucose control oral nutrition supplements and provided samples and coupons along with ordering information. I provided multiple nutrition fact sheets.  Questions were answered.  Contact information given.  Monitoring, evaluation, goals: Patient will tolerate increased calories and protein to minimize further weight loss.  Next visit: Tuesday, August 16 during infusion.  **Disclaimer: This note was dictated with  voice recognition software. Similar sounding words can inadvertently be transcribed and this note may contain transcription errors which may not have been corrected upon publication of note.**

## 2020-08-29 ENCOUNTER — Other Ambulatory Visit: Payer: Self-pay

## 2020-08-29 ENCOUNTER — Inpatient Hospital Stay: Payer: Medicare HMO

## 2020-08-29 ENCOUNTER — Other Ambulatory Visit: Payer: Self-pay | Admitting: Hematology and Oncology

## 2020-08-29 DIAGNOSIS — F17210 Nicotine dependence, cigarettes, uncomplicated: Secondary | ICD-10-CM | POA: Diagnosis not present

## 2020-08-29 DIAGNOSIS — C8442 Peripheral T-cell lymphoma, not classified, intrathoracic lymph nodes: Secondary | ICD-10-CM

## 2020-08-29 DIAGNOSIS — Z7984 Long term (current) use of oral hypoglycemic drugs: Secondary | ICD-10-CM | POA: Diagnosis not present

## 2020-08-29 DIAGNOSIS — Z5189 Encounter for other specified aftercare: Secondary | ICD-10-CM | POA: Diagnosis not present

## 2020-08-29 DIAGNOSIS — C8499 Mature T/NK-cell lymphomas, unspecified, extranodal and solid organ sites: Secondary | ICD-10-CM | POA: Diagnosis not present

## 2020-08-29 DIAGNOSIS — Z5111 Encounter for antineoplastic chemotherapy: Secondary | ICD-10-CM | POA: Diagnosis not present

## 2020-08-29 DIAGNOSIS — Z5112 Encounter for antineoplastic immunotherapy: Secondary | ICD-10-CM | POA: Diagnosis not present

## 2020-08-29 DIAGNOSIS — I10 Essential (primary) hypertension: Secondary | ICD-10-CM | POA: Diagnosis not present

## 2020-08-29 DIAGNOSIS — E119 Type 2 diabetes mellitus without complications: Secondary | ICD-10-CM | POA: Diagnosis not present

## 2020-08-29 DIAGNOSIS — Z7982 Long term (current) use of aspirin: Secondary | ICD-10-CM | POA: Diagnosis not present

## 2020-08-29 DIAGNOSIS — Z95828 Presence of other vascular implants and grafts: Secondary | ICD-10-CM

## 2020-08-29 LAB — CBC WITH DIFFERENTIAL (CANCER CENTER ONLY)
Abs Immature Granulocytes: 0.54 10*3/uL — ABNORMAL HIGH (ref 0.00–0.07)
Basophils Absolute: 0.1 10*3/uL (ref 0.0–0.1)
Basophils Relative: 1 %
Eosinophils Absolute: 0 10*3/uL (ref 0.0–0.5)
Eosinophils Relative: 0 %
HCT: 34.9 % — ABNORMAL LOW (ref 39.0–52.0)
Hemoglobin: 11.7 g/dL — ABNORMAL LOW (ref 13.0–17.0)
Immature Granulocytes: 3 %
Lymphocytes Relative: 6 %
Lymphs Abs: 1.2 10*3/uL (ref 0.7–4.0)
MCH: 28.1 pg (ref 26.0–34.0)
MCHC: 33.5 g/dL (ref 30.0–36.0)
MCV: 83.9 fL (ref 80.0–100.0)
Monocytes Absolute: 1.1 10*3/uL — ABNORMAL HIGH (ref 0.1–1.0)
Monocytes Relative: 6 %
Neutro Abs: 15.5 10*3/uL — ABNORMAL HIGH (ref 1.7–7.7)
Neutrophils Relative %: 84 %
Platelet Count: 364 10*3/uL (ref 150–400)
RBC: 4.16 MIL/uL — ABNORMAL LOW (ref 4.22–5.81)
RDW: 14.9 % (ref 11.5–15.5)
WBC Count: 18.5 10*3/uL — ABNORMAL HIGH (ref 4.0–10.5)
nRBC: 0 % (ref 0.0–0.2)

## 2020-08-29 LAB — CMP (CANCER CENTER ONLY)
ALT: 87 U/L — ABNORMAL HIGH (ref 0–44)
AST: 47 U/L — ABNORMAL HIGH (ref 15–41)
Albumin: 3.1 g/dL — ABNORMAL LOW (ref 3.5–5.0)
Alkaline Phosphatase: 762 U/L — ABNORMAL HIGH (ref 38–126)
Anion gap: 9 (ref 5–15)
BUN: 31 mg/dL — ABNORMAL HIGH (ref 8–23)
CO2: 25 mmol/L (ref 22–32)
Calcium: 8.6 mg/dL — ABNORMAL LOW (ref 8.9–10.3)
Chloride: 105 mmol/L (ref 98–111)
Creatinine: 0.83 mg/dL (ref 0.61–1.24)
GFR, Estimated: >60 mL/min (ref >60)
Glucose, Bld: 142 mg/dL — ABNORMAL HIGH (ref 70–99)
Potassium: 3.8 mmol/L (ref 3.5–5.1)
Sodium: 139 mmol/L (ref 135–145)
Total Bilirubin: 0.5 mg/dL (ref 0.3–1.2)
Total Protein: 6.5 g/dL (ref 6.5–8.1)

## 2020-08-29 LAB — URIC ACID: Uric Acid, Serum: 4.4 mg/dL (ref 3.7–8.6)

## 2020-08-29 LAB — LACTATE DEHYDROGENASE: LDH: 1705 U/L — ABNORMAL HIGH (ref 98–192)

## 2020-08-29 MED ORDER — HEPARIN SOD (PORK) LOCK FLUSH 100 UNIT/ML IV SOLN
500.0000 [IU] | Freq: Once | INTRAVENOUS | Status: AC
Start: 2020-08-29 — End: 2020-08-29
  Administered 2020-08-29: 500 [IU]
  Filled 2020-08-29: qty 5

## 2020-08-29 MED ORDER — SODIUM CHLORIDE 0.9% FLUSH
10.0000 mL | Freq: Once | INTRAVENOUS | Status: AC
Start: 2020-08-29 — End: 2020-08-29
  Administered 2020-08-29: 10 mL
  Filled 2020-08-29: qty 10

## 2020-09-02 ENCOUNTER — Other Ambulatory Visit: Payer: Self-pay | Admitting: Hematology and Oncology

## 2020-09-03 ENCOUNTER — Other Ambulatory Visit: Payer: Self-pay | Admitting: Physician Assistant

## 2020-09-03 DIAGNOSIS — C8442 Peripheral T-cell lymphoma, not classified, intrathoracic lymph nodes: Secondary | ICD-10-CM

## 2020-09-04 ENCOUNTER — Inpatient Hospital Stay: Payer: Medicare HMO | Admitting: Nutrition

## 2020-09-04 ENCOUNTER — Inpatient Hospital Stay: Payer: Medicare HMO

## 2020-09-04 ENCOUNTER — Inpatient Hospital Stay (HOSPITAL_BASED_OUTPATIENT_CLINIC_OR_DEPARTMENT_OTHER): Payer: Medicare HMO | Admitting: Physician Assistant

## 2020-09-04 ENCOUNTER — Other Ambulatory Visit: Payer: Self-pay

## 2020-09-04 VITALS — BP 137/68 | HR 101 | Temp 98.5°F | Resp 18 | Wt 198.8 lb

## 2020-09-04 VITALS — HR 93

## 2020-09-04 DIAGNOSIS — C8442 Peripheral T-cell lymphoma, not classified, intrathoracic lymph nodes: Secondary | ICD-10-CM | POA: Diagnosis not present

## 2020-09-04 DIAGNOSIS — Z7982 Long term (current) use of aspirin: Secondary | ICD-10-CM | POA: Diagnosis not present

## 2020-09-04 DIAGNOSIS — Z7984 Long term (current) use of oral hypoglycemic drugs: Secondary | ICD-10-CM | POA: Diagnosis not present

## 2020-09-04 DIAGNOSIS — Z5112 Encounter for antineoplastic immunotherapy: Secondary | ICD-10-CM | POA: Diagnosis not present

## 2020-09-04 DIAGNOSIS — Z5111 Encounter for antineoplastic chemotherapy: Secondary | ICD-10-CM | POA: Diagnosis not present

## 2020-09-04 DIAGNOSIS — Z95828 Presence of other vascular implants and grafts: Secondary | ICD-10-CM

## 2020-09-04 DIAGNOSIS — I10 Essential (primary) hypertension: Secondary | ICD-10-CM | POA: Diagnosis not present

## 2020-09-04 DIAGNOSIS — F17210 Nicotine dependence, cigarettes, uncomplicated: Secondary | ICD-10-CM | POA: Diagnosis not present

## 2020-09-04 DIAGNOSIS — C8499 Mature T/NK-cell lymphomas, unspecified, extranodal and solid organ sites: Secondary | ICD-10-CM | POA: Diagnosis not present

## 2020-09-04 DIAGNOSIS — Z5189 Encounter for other specified aftercare: Secondary | ICD-10-CM | POA: Diagnosis not present

## 2020-09-04 DIAGNOSIS — E119 Type 2 diabetes mellitus without complications: Secondary | ICD-10-CM | POA: Diagnosis not present

## 2020-09-04 LAB — CBC WITH DIFFERENTIAL (CANCER CENTER ONLY)
Abs Immature Granulocytes: 0.2 10*3/uL — ABNORMAL HIGH (ref 0.00–0.07)
Basophils Absolute: 0.2 10*3/uL — ABNORMAL HIGH (ref 0.0–0.1)
Basophils Relative: 1 %
Eosinophils Absolute: 0.4 10*3/uL (ref 0.0–0.5)
Eosinophils Relative: 3 %
HCT: 35 % — ABNORMAL LOW (ref 39.0–52.0)
Hemoglobin: 11.7 g/dL — ABNORMAL LOW (ref 13.0–17.0)
Immature Granulocytes: 1 %
Lymphocytes Relative: 8 %
Lymphs Abs: 1.2 10*3/uL (ref 0.7–4.0)
MCH: 28.1 pg (ref 26.0–34.0)
MCHC: 33.4 g/dL (ref 30.0–36.0)
MCV: 83.9 fL (ref 80.0–100.0)
Monocytes Absolute: 1.1 10*3/uL — ABNORMAL HIGH (ref 0.1–1.0)
Monocytes Relative: 7 %
Neutro Abs: 11.2 10*3/uL — ABNORMAL HIGH (ref 1.7–7.7)
Neutrophils Relative %: 80 %
Platelet Count: 461 10*3/uL — ABNORMAL HIGH (ref 150–400)
RBC: 4.17 MIL/uL — ABNORMAL LOW (ref 4.22–5.81)
RDW: 15.1 % (ref 11.5–15.5)
WBC Count: 14.2 10*3/uL — ABNORMAL HIGH (ref 4.0–10.5)
nRBC: 0 % (ref 0.0–0.2)

## 2020-09-04 LAB — CMP (CANCER CENTER ONLY)
ALT: 53 U/L — ABNORMAL HIGH (ref 0–44)
AST: 27 U/L (ref 15–41)
Albumin: 3.4 g/dL — ABNORMAL LOW (ref 3.5–5.0)
Alkaline Phosphatase: 670 U/L — ABNORMAL HIGH (ref 38–126)
Anion gap: 9 (ref 5–15)
BUN: 19 mg/dL (ref 8–23)
CO2: 25 mmol/L (ref 22–32)
Calcium: 8.8 mg/dL — ABNORMAL LOW (ref 8.9–10.3)
Chloride: 105 mmol/L (ref 98–111)
Creatinine: 0.78 mg/dL (ref 0.61–1.24)
GFR, Estimated: >60 mL/min (ref >60)
Glucose, Bld: 136 mg/dL — ABNORMAL HIGH (ref 70–99)
Potassium: 3.7 mmol/L (ref 3.5–5.1)
Sodium: 139 mmol/L (ref 135–145)
Total Bilirubin: 0.6 mg/dL (ref 0.3–1.2)
Total Protein: 6.9 g/dL (ref 6.5–8.1)

## 2020-09-04 LAB — LACTATE DEHYDROGENASE: LDH: 727 U/L — ABNORMAL HIGH (ref 98–192)

## 2020-09-04 LAB — URIC ACID: Uric Acid, Serum: 4.1 mg/dL (ref 3.7–8.6)

## 2020-09-04 MED ORDER — PALONOSETRON HCL INJECTION 0.25 MG/5ML
0.2500 mg | Freq: Once | INTRAVENOUS | Status: AC
  Administered 2020-09-04: 0.25 mg via INTRAVENOUS
  Filled 2020-09-04: qty 5

## 2020-09-04 MED ORDER — HEPARIN SOD (PORK) LOCK FLUSH 100 UNIT/ML IV SOLN
500.0000 [IU] | Freq: Once | INTRAVENOUS | Status: AC | PRN
  Administered 2020-09-04: 500 [IU]

## 2020-09-04 MED ORDER — SODIUM CHLORIDE 0.9% FLUSH
10.0000 mL | Freq: Once | INTRAVENOUS | Status: AC
  Administered 2020-09-04: 10 mL

## 2020-09-04 MED ORDER — ACETAMINOPHEN 325 MG PO TABS
650.0000 mg | ORAL_TABLET | Freq: Once | ORAL | Status: AC
  Administered 2020-09-04: 650 mg via ORAL
  Filled 2020-09-04: qty 2

## 2020-09-04 MED ORDER — SODIUM CHLORIDE 0.9 % IV SOLN: Freq: Once | INTRAVENOUS | Status: AC

## 2020-09-04 MED ORDER — DIPHENHYDRAMINE HCL 50 MG/ML IJ SOLN
50.0000 mg | Freq: Once | INTRAMUSCULAR | Status: AC
  Administered 2020-09-04: 50 mg via INTRAVENOUS
  Filled 2020-09-04: qty 1

## 2020-09-04 MED ORDER — SODIUM CHLORIDE 0.9 % IV SOLN
150.0000 mg | Freq: Once | INTRAVENOUS | Status: AC
  Administered 2020-09-04: 150 mg via INTRAVENOUS
  Filled 2020-09-04: qty 150

## 2020-09-04 MED ORDER — SODIUM CHLORIDE 0.9 % IV SOLN
10.0000 mg | Freq: Once | INTRAVENOUS | Status: AC
  Administered 2020-09-04: 10 mg via INTRAVENOUS
  Filled 2020-09-04: qty 10

## 2020-09-04 MED ORDER — SODIUM CHLORIDE 0.9 % IV SOLN
750.0000 mg/m2 | Freq: Once | INTRAVENOUS | Status: AC
  Administered 2020-09-04: 1620 mg via INTRAVENOUS
  Filled 2020-09-04: qty 81

## 2020-09-04 MED ORDER — DOXORUBICIN HCL CHEMO IV INJECTION 2 MG/ML
50.0000 mg/m2 | Freq: Once | INTRAVENOUS | Status: AC
  Administered 2020-09-04: 108 mg via INTRAVENOUS
  Filled 2020-09-04: qty 54

## 2020-09-04 MED ORDER — SODIUM CHLORIDE 0.9 % IV SOLN
1.8000 mg/kg | Freq: Once | INTRAVENOUS | Status: AC
  Administered 2020-09-04: 170 mg via INTRAVENOUS
  Filled 2020-09-04: qty 34

## 2020-09-04 MED ORDER — SODIUM CHLORIDE 0.9% FLUSH
10.0000 mL | INTRAVENOUS | Status: DC | PRN
  Administered 2020-09-04: 10 mL

## 2020-09-04 NOTE — Patient Instructions (Addendum)
[facility]  Discharge Instructions: Thank you for choosing [facility] to provide your oncology and hematology care.   If you have a lab appointment with the [facility], please go directly to the [facility] and check in at the registration area.   Wear comfortable clothing and clothing appropriate for easy access to any Portacath or PICC line.   We strive to give you quality time with your provider. You may need to reschedule your appointment if you arrive late (15 or more minutes).  Arriving late affects you and other patients whose appointments are after yours.  Also, if you miss three or more appointments without notifying the office, you may be dismissed from the clinic at the provider's discretion.      For prescription refill requests, have your pharmacy contact our office and allow 72 hours for refills to be completed.    Today you received the following chemotherapy and/or immunotherapy agents: Doxorubicin (Adriamycin), Cyclophosphamide (Cytoxan), Brentuxumab-vedotin (ADCETRIS)   To help prevent nausea and vomiting after your treatment, we encourage you to take your nausea medication as directed.  BELOW ARE SYMPTOMS THAT SHOULD BE REPORTED IMMEDIATELY: *FEVER GREATER THAN 100.4 F (38 C) OR HIGHER *CHILLS OR SWEATING *NAUSEA AND VOMITING THAT IS NOT CONTROLLED WITH YOUR NAUSEA MEDICATION *UNUSUAL SHORTNESS OF BREATH *UNUSUAL BRUISING OR BLEEDING *URINARY PROBLEMS (pain or burning when urinating, or frequent urination) *BOWEL PROBLEMS (unusual diarrhea, constipation, pain near the anus) TENDERNESS IN MOUTH AND THROAT WITH OR WITHOUT PRESENCE OF ULCERS (sore throat, sores in mouth, or a toothache) UNUSUAL RASH, SWELLING OR PAIN  UNUSUAL VAGINAL DISCHARGE OR ITCHING   Items with * indicate a potential emergency and should be followed up as soon as possible or go to the Emergency Department if any problems should occur.  Please show  the CHEMOTHERAPY ALERT CARD or IMMUNOTHERAPY ALERT CARD at check-in to the Emergency Department and triage nurse.  Should you have questions after your visit or need to cancel or reschedule your appointment, please contact [facility]  Dept: [phone]  and follow the prompts.  Office hours are 8:00 a.m. to 4:30 p.m. Monday - Friday. Please note that voicemails left after 4:00 p.m. may not be returned until the following business day.  We are closed weekends and major holidays. You have access to a nurse at all times for urgent questions. Please call the main number to the clinic Dept: [phone] and follow the prompts.   For any non-urgent questions, you may also contact your provider using MyChart. We now offer e-Visits for anyone 14 and older to request care online for non-urgent symptoms. For details visit [email or web address].   Also download the MyChart app! Go to the app store, search "MyChart", open the app, select [facility], and log in with your MyChart username and password.  Due to Covid, a mask is required upon entering the hospital/clinic. If you do not have a mask, one will be given to you upon arrival. For doctor visits, patients may have 1 support person aged 73 or older with them. For treatment visits, patients cannot have anyone with them due to current Covid guidelines and our immunocompromised population.

## 2020-09-04 NOTE — Progress Notes (Signed)
[facility] Telephone:([phone]) [phone]   Fax:([phone]) [phone]  PROGRESS NOTE  Patient Care Team: Fanny Bien, MD as PCP - General (Family Medicine)  Hematological/Oncological History # CD30-Positive T-cell Lymphoma, Stage III/IV 07/09/2020: patient underwent cholecystectomy. Liver wedge biopsy during the procedure revealed involvement of a CD30-positive T-cell lymphoproliferative disorder 07/25/2020: establish care with Dr. Lorenso Courier  08/08/2020: PET CT scan showed innumerable hypermetabolic lesions (primarily Deauville 4) in the liver and skeleton. Left neck adenopathy including a dominant level IB lymph node which is Deauville 5. 08/15/2020: Cycle 1 Day 1 of BV-CHP  09/04/2020: Cycle 2 Day 1 of BV-CHP  Interval History:  Maurice Fox 70 y.o. male with medical history significant for T cell lymphoma (Stage III/IV) presents for a follow up visit. The patient's last visit was on 08/15/2020 at which he started treatment with BV-CHP.   On exam today Maurice Fox is accompanied by his son. He reports that his energy and appetite have improved since starting treatment. He continues to loose weight. He is able to do more daily routines. He denies any nausea, vomiting or abdominal pain. He continues to have low back pain and hip pain that is managed with Tylenol q6h. His bowel movements are regular with BM every 2 days. He denies easy bruising or signs of bleeding. He notes having lightheadedness mainly in the morning when getting up. He denies any syncopal episodes or other focal deficits. Patient denies any fevers, chills, shortness of breath, chest pain or cough. He has no other complaints.  MEDICAL HISTORY:  Past Medical History:  Diagnosis Date   Arthritis    Hyperlipidemia    Hypertension    followed by pcp  (11-23-2019  per pt had stress test greater than 20 yrs ago, told ok)   Nocturia    OSA on CPAP    per pt uses nightly   Phimosis    Type 2 diabetes mellitus ([place])     followed by pcp   (11-23-2019 per pt does not check blood sugar at home)   Wears glasses     SURGICAL HISTORY: Past Surgical History:  Procedure Laterality Date   APPENDECTOMY  child   CHOLECYSTECTOMY N/A 07/09/2020   Procedure: LAPAROSCOPIC CHOLECYSTECTOMY WITH INTRAOPERATIVE CHOLANGIOGRAM,;  Surgeon: Greer Pickerel, MD;  Location: [facility];  Service: General;  Laterality: N/A;   CIRCUMCISION N/A 11/29/2019   Procedure: CIRCUMCISION ADULT;  Surgeon: [name], MD;  Location: [facility];  Service: Urology;  Laterality: N/A;   IR IMAGING GUIDED PORT INSERTION  08/06/2020   LIVER BIOPSY N/A 07/09/2020   Procedure: LAP LIVER BIOPSY;  Surgeon: Greer Pickerel, MD;  Location: [facility];  Service: General;  Laterality: N/A;   ROTATOR CUFF REPAIR Left chld   SUPRA-UMBILICAL HERNIA N/A 4/62/7035   Procedure: PRIMARY REPAIR SUPRA-UMBILICAL HERNIA;  Surgeon: Greer Pickerel, MD;  Location: [facility];  Service: General;  Laterality: N/A;    SOCIAL HISTORY: Social History   Socioeconomic History   Marital status: Divorced    Spouse name: Not on file   Number of children: Not on file   Years of education: Not on file   Highest education level: Not on file  Occupational History   Not on file  Tobacco Use   Smoking status: Every Day    Packs/day: 1.00    Years: 49.00    Pack years: 49.00    Types: Cigars, Cigarettes   Smokeless tobacco: Never  Vaping Use   Vaping Use: Never used  Substance and Sexual Activity   Alcohol use: Not Currently   Drug use: Never   Sexual activity: Not on file  Other Topics Concern   Not on file  Social History Narrative   Not on file   Social Determinants of Health   Financial Resource Strain: Not on file  Food Insecurity: Not on file  Transportation Needs: Not on file  Physical Activity: Not on file  Stress: Not on file  Social Connections: Not on file  Intimate Partner Violence: Not on file    FAMILY HISTORY: Family History  Problem  Relation Age of Onset   Hypotension Mother    Diabetes Mellitus II Father    Prostate cancer Brother    Prostate cancer Brother     ALLERGIES:  has No Known Allergies.  MEDICATIONS:  Current Outpatient Medications  Medication Sig Dispense Refill   allopurinol (ZYLOPRIM) 300 MG tablet Take 1 tablet (300 mg total) by mouth daily. 90 tablet 1   amLODipine (NORVASC) 10 MG tablet Take 10 mg by mouth daily.     aspirin 81 MG EC tablet Take 81 mg by mouth at bedtime.     diclofenac Sodium (VOLTAREN) 1 % GEL Apply 2 g topically 4 (four) times daily as needed for pain.     Empagliflozin-metFORMIN HCl ER 25-1000 MG TB24 Take 1 tablet by mouth at bedtime.     fluticasone (FLONASE) 50 MCG/ACT nasal spray Place 2 sprays into both nostrils in the morning and at bedtime.     lidocaine-prilocaine (EMLA) cream Apply 1 application topically as needed. 30 g 0   methocarbamol (ROBAXIN) 500 MG tablet Take 1 tablet (500 mg total) by mouth every 8 (eight) hours as needed for muscle spasms. 30 tablet 0   Multiple Vitamins-Minerals (CENTRUM SILVER PO) Take 1 tablet by mouth at bedtime.     ondansetron (ZOFRAN) 8 MG tablet Take 1 tablet (8 mg total) by mouth every 8 (eight) hours as needed for nausea or vomiting. 30 tablet 0   predniSONE (DELTASONE) 20 MG tablet Take 3 tablets (60 mg total) by mouth daily with breakfast. 15 tablet 5   prochlorperazine (COMPAZINE) 10 MG tablet Take 1 tablet (10 mg total) by mouth every 6 (six) hours as needed for nausea or vomiting. 30 tablet 0   Propylene Glycol (SYSTANE BALANCE OP) Place 1 drop into both eyes 2 (two) times daily.     rosuvastatin (CRESTOR) 5 MG tablet Take 5 mg by mouth at bedtime.     sildenafil (VIAGRA) 100 MG tablet Take 100 mg by mouth daily as needed (ED).     tamsulosin (FLOMAX) 0.4 MG CAPS capsule Take 0.4 mg by mouth at bedtime.     No current facility-administered medications for this visit.    REVIEW OF SYSTEMS:   Constitutional: ( - ) fevers, ( -  )  chills , ( - ) night sweats Eyes: ( - ) blurriness of vision, ( - ) double vision, ( - ) watery eyes Ears, nose, mouth, throat, and face: ( - ) mucositis, ( - ) sore throat Respiratory: ( - ) cough, ( - ) dyspnea, ( - ) wheezes Cardiovascular: ( - ) palpitation, ( - ) chest discomfort, ( - ) lower extremity swelling Gastrointestinal:  ( - ) nausea, ( - ) heartburn, ( - ) change in bowel habits Skin: ( - ) abnormal skin rashes Lymphatics: ( - ) new lymphadenopathy, ( - ) easy bruising Neurological: ( - ) numbness, ( - )  tingling, ( - ) new weaknesses Behavioral/Psych: ( - ) mood change, ( - ) new changes  All other systems were reviewed with the patient and are negative.  PHYSICAL EXAMINATION: ECOG PERFORMANCE STATUS: 1 - Symptomatic but completely ambulatory  Vitals:   09/04/20 1023  BP: 137/68  Pulse: (!) 101  Resp: 18  Temp: 98.5 F (36.9 C)  SpO2: 100%   Filed Weights   09/04/20 1023  Weight: 198 lb 12.8 oz (90.2 kg)    GENERAL: Well-appearing elderly African-American male, alert, no distress and comfortable SKIN: skin color, texture, turgor are normal, no rashes or significant lesions EYES: conjunctiva are pink and non-injected, sclera clear NECK: supple, non-tender LYMPH: Improving palpable left submandibular lymph node, otherwise no palpable lymphadenopathy in the cervical, axillary or inguinal LUNGS: clear to auscultation and percussion with normal breathing effort HEART: regular rate & rhythm and no murmurs and no lower extremity edema PSYCH: alert & oriented x 3, fluent speech NEURO: no focal motor/sensory deficits  LABORATORY DATA:  I have reviewed the data as listed CBC Latest Ref Rng & Units 09/04/2020 08/29/2020 08/22/2020  WBC 4.0 - 10.5 K/uL 14.2(H) 18.5(H) 2.6(L)  Hemoglobin 13.0 - 17.0 g/dL 11.7(L) 11.7(L) 12.5(L)  Hematocrit 39.0 - 52.0 % 35.0(L) 34.9(L) 35.6(L)  Platelets 150 - 400 K/uL 461(H) 364 212    CMP Latest Ref Rng & Units 08/29/2020 08/22/2020  08/15/2020  Glucose 70 - 99 mg/dL 142(H) 196(H) 158(H)  BUN 8 - 23 mg/dL 31(H) 35(H) 32(H)  Creatinine 0.61 - 1.24 mg/dL 0.83 0.84 0.84  Sodium 135 - 145 mmol/L 139 137 139  Potassium 3.5 - 5.1 mmol/L 3.8 3.9 4.0  Chloride 98 - 111 mmol/L 105 102 101  CO2 22 - 32 mmol/L _0 Calcium 8.9 - 10.3 mg/dL 8.6(L) 8.9 10.7(H)  Total Protein 6.5 - 8.1 g/dL 6.5 6.5 7.2  Total Bilirubin 0.3 - 1.2 mg/dL 0.5 1.4(H) 1.8(H)  Alkaline Phos 38 - 126 U/L 762(H) 921(H) 1,038(H)  AST 15 - 41 U/L 47(H) 122(H) 286(HH)  ALT 0 - 44 U/L 87(H) 156(H) 328(HH)   RADIOGRAPHIC STUDIES: No images were reviewed during this visit.   ASSESSMENT & PLAN REMY VOILES 70 y.o. male with medical history significant for T cell lymphoma (Stage III/IV) presents for a follow up visit.  After review of the labs, review of the records, and discussion with the patient the patients findings are most consistent with a peripheral CD30 positive T-cell lymphoma.  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  The patient has been staged with a PET CT scan which showed clear involvement of the skeleton and liver, most consistent with stage IV disease.  Given these findings we will plan for 6 cycles of BV CHP chemotherapy.  The regimen of BV CHP consists of brentuximab 1.8 mg/kg IV on day 1, cyclophosphamide 750 mg per metered squared on day 1, doxorubicin 50 mg per metered squared on day 1, and prednisone 60 mg p.o. day 1 through 5.  This is to be continued every 21 days with a plan for up to 6 cycles.  After the third or fourth cycle we will consider restaging PET CT scan in order to evaluate for response.  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 --TTE performed 07/08/2020, shows EF of 50-55% --negative HIV, Hep B  and C  serologies. --GCSF support on Day 3 of each cycle.  --Patient returns today, 09/04/2020, for Cycle 2 Day 1 of treatment. Labs were reviewed without any intervention needed. LDH has improved from 1705 to 727. Liver enzymes and Alk Phos are improving. Patient will proceed with treatment today.  --RTC in 3 weeks for Cycle 3 Day 1 of treatment.   #Supportive Care -- chemotherapy education complete -- port placed .  -- zofran 78m q8H PRN and compazine 160mPO q6H for nausea -- allopurinol 30024mO daily for TLS prophylaxis -- EMLA cream for port -- no pain medication required at this time.    No orders of the defined types were placed in this encounter.  All questions were answered. The patient knows to call the clinic with any problems, questions or concerns.  A total of more than 25 minutes were spent on this encounter with face-to-face time and non-face-to-face time, including preparing to see the patient, ordering medications, counseling the patient and coordination of care as outlined above.   IreLincoln BrighamA-C Hematology and Oncology [facility] [place]8/16/2022 10:28 AM

## 2020-09-04 NOTE — Progress Notes (Signed)
Brief nutrition follow-up completed with patient during infusion for lymphoproliferative disorder/T-cell lymphoma. Weight improved and documented as 198.8 pounds up from 197.4 pounds. Noted labs: Glucose 136 and albumin 3.4. Patient denies any nutrition impact symptoms.  He reports he is eating well overall and he has no questions or concerns today.  Nutrition diagnosis: Unintended weight loss is stabilized.  Encourage patient to continue strategies for adequate calorie and protein intake.  Recommended patient contact RD with further questions or concerns.  **Disclaimer: This note was dictated with voice recognition software. Similar sounding words can inadvertently be transcribed and this note may contain transcription errors which may not have been corrected upon publication of note.**

## 2020-09-06 ENCOUNTER — Other Ambulatory Visit: Payer: Self-pay

## 2020-09-06 ENCOUNTER — Inpatient Hospital Stay: Payer: Medicare HMO

## 2020-09-06 VITALS — BP 130/64 | HR 89 | Temp 99.1°F | Resp 16

## 2020-09-06 DIAGNOSIS — Z7984 Long term (current) use of oral hypoglycemic drugs: Secondary | ICD-10-CM | POA: Diagnosis not present

## 2020-09-06 DIAGNOSIS — Z7982 Long term (current) use of aspirin: Secondary | ICD-10-CM | POA: Diagnosis not present

## 2020-09-06 DIAGNOSIS — E119 Type 2 diabetes mellitus without complications: Secondary | ICD-10-CM | POA: Diagnosis not present

## 2020-09-06 DIAGNOSIS — Z5189 Encounter for other specified aftercare: Secondary | ICD-10-CM | POA: Diagnosis not present

## 2020-09-06 DIAGNOSIS — I10 Essential (primary) hypertension: Secondary | ICD-10-CM | POA: Diagnosis not present

## 2020-09-06 DIAGNOSIS — Z5111 Encounter for antineoplastic chemotherapy: Secondary | ICD-10-CM | POA: Diagnosis not present

## 2020-09-06 DIAGNOSIS — F17210 Nicotine dependence, cigarettes, uncomplicated: Secondary | ICD-10-CM | POA: Diagnosis not present

## 2020-09-06 DIAGNOSIS — C8442 Peripheral T-cell lymphoma, not classified, intrathoracic lymph nodes: Secondary | ICD-10-CM

## 2020-09-06 DIAGNOSIS — Z5112 Encounter for antineoplastic immunotherapy: Secondary | ICD-10-CM | POA: Diagnosis not present

## 2020-09-06 DIAGNOSIS — C8499 Mature T/NK-cell lymphomas, unspecified, extranodal and solid organ sites: Secondary | ICD-10-CM | POA: Diagnosis not present

## 2020-09-06 MED ORDER — PEGFILGRASTIM-CBQV 6 MG/0.6ML ~~LOC~~ SOSY
6.0000 mg | PREFILLED_SYRINGE | Freq: Once | SUBCUTANEOUS | Status: AC
  Administered 2020-09-06: 6 mg via SUBCUTANEOUS
  Filled 2020-09-06: qty 0.6

## 2020-09-19 DIAGNOSIS — Z720 Tobacco use: Secondary | ICD-10-CM | POA: Diagnosis not present

## 2020-09-19 DIAGNOSIS — E1165 Type 2 diabetes mellitus with hyperglycemia: Secondary | ICD-10-CM | POA: Diagnosis not present

## 2020-09-19 DIAGNOSIS — Z7185 Encounter for immunization safety counseling: Secondary | ICD-10-CM | POA: Diagnosis not present

## 2020-09-19 DIAGNOSIS — C8590 Non-Hodgkin lymphoma, unspecified, unspecified site: Secondary | ICD-10-CM | POA: Diagnosis not present

## 2020-09-19 DIAGNOSIS — I10 Essential (primary) hypertension: Secondary | ICD-10-CM | POA: Diagnosis not present

## 2020-09-20 DIAGNOSIS — Z23 Encounter for immunization: Secondary | ICD-10-CM | POA: Diagnosis not present

## 2020-09-27 ENCOUNTER — Inpatient Hospital Stay: Payer: Medicare HMO

## 2020-09-27 ENCOUNTER — Inpatient Hospital Stay: Payer: Medicare HMO | Attending: Hematology and Oncology | Admitting: Hematology and Oncology

## 2020-09-27 ENCOUNTER — Encounter: Payer: Self-pay | Admitting: Hematology and Oncology

## 2020-09-27 ENCOUNTER — Other Ambulatory Visit: Payer: Self-pay

## 2020-09-27 ENCOUNTER — Telehealth: Payer: Self-pay

## 2020-09-27 VITALS — BP 135/60 | HR 93 | Temp 97.3°F | Resp 20 | Wt 205.5 lb

## 2020-09-27 DIAGNOSIS — C8442 Peripheral T-cell lymphoma, not classified, intrathoracic lymph nodes: Secondary | ICD-10-CM

## 2020-09-27 DIAGNOSIS — E119 Type 2 diabetes mellitus without complications: Secondary | ICD-10-CM | POA: Insufficient documentation

## 2020-09-27 DIAGNOSIS — Z5111 Encounter for antineoplastic chemotherapy: Secondary | ICD-10-CM | POA: Insufficient documentation

## 2020-09-27 DIAGNOSIS — Z8042 Family history of malignant neoplasm of prostate: Secondary | ICD-10-CM | POA: Diagnosis not present

## 2020-09-27 DIAGNOSIS — C8499 Mature T/NK-cell lymphomas, unspecified, extranodal and solid organ sites: Secondary | ICD-10-CM | POA: Diagnosis not present

## 2020-09-27 DIAGNOSIS — I10 Essential (primary) hypertension: Secondary | ICD-10-CM | POA: Insufficient documentation

## 2020-09-27 DIAGNOSIS — Z79899 Other long term (current) drug therapy: Secondary | ICD-10-CM | POA: Diagnosis not present

## 2020-09-27 DIAGNOSIS — Z5112 Encounter for antineoplastic immunotherapy: Secondary | ICD-10-CM | POA: Diagnosis not present

## 2020-09-27 DIAGNOSIS — Z95828 Presence of other vascular implants and grafts: Secondary | ICD-10-CM

## 2020-09-27 DIAGNOSIS — Z7982 Long term (current) use of aspirin: Secondary | ICD-10-CM | POA: Diagnosis not present

## 2020-09-27 DIAGNOSIS — Z833 Family history of diabetes mellitus: Secondary | ICD-10-CM | POA: Insufficient documentation

## 2020-09-27 DIAGNOSIS — Z8249 Family history of ischemic heart disease and other diseases of the circulatory system: Secondary | ICD-10-CM | POA: Diagnosis not present

## 2020-09-27 DIAGNOSIS — F17210 Nicotine dependence, cigarettes, uncomplicated: Secondary | ICD-10-CM | POA: Insufficient documentation

## 2020-09-27 DIAGNOSIS — Z5189 Encounter for other specified aftercare: Secondary | ICD-10-CM | POA: Insufficient documentation

## 2020-09-27 LAB — CBC WITH DIFFERENTIAL (CANCER CENTER ONLY)
Abs Immature Granulocytes: 0.07 10*3/uL (ref 0.00–0.07)
Basophils Absolute: 0 10*3/uL (ref 0.0–0.1)
Basophils Relative: 0 %
Eosinophils Absolute: 0 10*3/uL (ref 0.0–0.5)
Eosinophils Relative: 0 %
HCT: 33 % — ABNORMAL LOW (ref 39.0–52.0)
Hemoglobin: 10.8 g/dL — ABNORMAL LOW (ref 13.0–17.0)
Immature Granulocytes: 1 %
Lymphocytes Relative: 5 %
Lymphs Abs: 0.7 10*3/uL (ref 0.7–4.0)
MCH: 28.6 pg (ref 26.0–34.0)
MCHC: 32.7 g/dL (ref 30.0–36.0)
MCV: 87.5 fL (ref 80.0–100.0)
Monocytes Absolute: 0.2 10*3/uL (ref 0.1–1.0)
Monocytes Relative: 2 %
Neutro Abs: 11.7 10*3/uL — ABNORMAL HIGH (ref 1.7–7.7)
Neutrophils Relative %: 92 %
Platelet Count: 444 10*3/uL — ABNORMAL HIGH (ref 150–400)
RBC: 3.77 MIL/uL — ABNORMAL LOW (ref 4.22–5.81)
RDW: 19.8 % — ABNORMAL HIGH (ref 11.5–15.5)
WBC Count: 12.7 10*3/uL — ABNORMAL HIGH (ref 4.0–10.5)
nRBC: 0 % (ref 0.0–0.2)

## 2020-09-27 LAB — CMP (CANCER CENTER ONLY)
ALT: 20 U/L (ref 0–44)
AST: 16 U/L (ref 15–41)
Albumin: 3.5 g/dL (ref 3.5–5.0)
Alkaline Phosphatase: 265 U/L — ABNORMAL HIGH (ref 38–126)
Anion gap: 10 (ref 5–15)
BUN: 26 mg/dL — ABNORMAL HIGH (ref 8–23)
CO2: 22 mmol/L (ref 22–32)
Calcium: 9.5 mg/dL (ref 8.9–10.3)
Chloride: 107 mmol/L (ref 98–111)
Creatinine: 0.81 mg/dL (ref 0.61–1.24)
GFR, Estimated: >60 mL/min (ref >60)
Glucose, Bld: 241 mg/dL — ABNORMAL HIGH (ref 70–99)
Potassium: 4 mmol/L (ref 3.5–5.1)
Sodium: 139 mmol/L (ref 135–145)
Total Bilirubin: 0.4 mg/dL (ref 0.3–1.2)
Total Protein: 7 g/dL (ref 6.5–8.1)

## 2020-09-27 LAB — LACTATE DEHYDROGENASE: LDH: 212 U/L — ABNORMAL HIGH (ref 98–192)

## 2020-09-27 LAB — URIC ACID: Uric Acid, Serum: 2.9 mg/dL — ABNORMAL LOW (ref 3.7–8.6)

## 2020-09-27 MED ORDER — DEXAMETHASONE SODIUM PHOSPHATE 100 MG/10ML IJ SOLN
10.0000 mg | Freq: Once | INTRAMUSCULAR | Status: AC
  Administered 2020-09-27: 10 mg via INTRAVENOUS
  Filled 2020-09-27: qty 10

## 2020-09-27 MED ORDER — DOXORUBICIN HCL CHEMO IV INJECTION 2 MG/ML
50.0000 mg/m2 | Freq: Once | INTRAVENOUS | Status: AC
  Administered 2020-09-27: 108 mg via INTRAVENOUS
  Filled 2020-09-27: qty 54

## 2020-09-27 MED ORDER — PALONOSETRON HCL INJECTION 0.25 MG/5ML
INTRAVENOUS | Status: AC
  Administered 2020-09-27: 0.25 mg via INTRAVENOUS
  Filled 2020-09-27: qty 5

## 2020-09-27 MED ORDER — DIPHENHYDRAMINE HCL 50 MG/ML IJ SOLN
50.0000 mg | Freq: Once | INTRAMUSCULAR | Status: AC
  Administered 2020-09-27: 50 mg via INTRAVENOUS
  Filled 2020-09-27: qty 1

## 2020-09-27 MED ORDER — ACETAMINOPHEN 325 MG PO TABS
650.0000 mg | ORAL_TABLET | Freq: Once | ORAL | Status: AC
  Administered 2020-09-27: 650 mg via ORAL

## 2020-09-27 MED ORDER — SODIUM CHLORIDE 0.9 % IV SOLN
1.8000 mg/kg | Freq: Once | INTRAVENOUS | Status: AC
  Administered 2020-09-27: 170 mg via INTRAVENOUS
  Filled 2020-09-27: qty 34

## 2020-09-27 MED ORDER — SODIUM CHLORIDE 0.9 % IV SOLN
150.0000 mg | Freq: Once | INTRAVENOUS | Status: AC
  Administered 2020-09-27: 150 mg via INTRAVENOUS
  Filled 2020-09-27: qty 150

## 2020-09-27 MED ORDER — SODIUM CHLORIDE 0.9% FLUSH
10.0000 mL | INTRAVENOUS | Status: DC | PRN
  Administered 2020-09-27: 10 mL

## 2020-09-27 MED ORDER — SODIUM CHLORIDE 0.9 % IV SOLN
750.0000 mg/m2 | Freq: Once | INTRAVENOUS | Status: AC
  Administered 2020-09-27: 1620 mg via INTRAVENOUS
  Filled 2020-09-27: qty 81

## 2020-09-27 MED ORDER — HEPARIN SOD (PORK) LOCK FLUSH 100 UNIT/ML IV SOLN
500.0000 [IU] | Freq: Once | INTRAVENOUS | Status: AC | PRN
  Administered 2020-09-27: 500 [IU]

## 2020-09-27 MED ORDER — ACETAMINOPHEN 325 MG PO TABS
ORAL_TABLET | ORAL | Status: AC
  Filled 2020-09-27: qty 2

## 2020-09-27 MED ORDER — PALONOSETRON HCL INJECTION 0.25 MG/5ML
0.2500 mg | Freq: Once | INTRAVENOUS | Status: AC
Start: 2020-09-27 — End: 2020-09-27

## 2020-09-27 MED ORDER — SODIUM CHLORIDE 0.9 % IV SOLN: Freq: Once | INTRAVENOUS | Status: AC

## 2020-09-27 NOTE — Patient Instructions (Signed)
Implanted Port Home Guide An implanted port is a device that is placed under the skin. It is usually placed in the chest. The device can be used to give IV medicine, to take blood, or for dialysis. You may have an implanted port if: You need IV medicine that would be irritating to the small veins in your hands or arms. You need IV medicines, such as antibiotics, for a long period of time. You need IV nutrition for a long period of time. You need dialysis. When you have a port, your health care provider can choose to use the port instead of veins in your arms for these procedures. You may have fewer limitations when using a port than you would if you used other types of long-term IVs, and you will likely be able to return to normal activities after your incision heals. An implanted port has two main parts: Reservoir. The reservoir is the part where a needle is inserted to give medicines or draw blood. The reservoir is round. After it is placed, it appears as a small, raised area under your skin. Catheter. The catheter is a thin, flexible tube that connects the reservoir to a vein. Medicine that is inserted into the reservoir goes into the catheter and then into the vein. How is my port accessed? To access your port: A numbing cream may be placed on the skin over the port site. Your health care provider will put on a mask and sterile gloves. The skin over your port will be cleaned carefully with a germ-killing soap and allowed to dry. Your health care provider will gently pinch the port and insert a needle into it. Your health care provider will check for a blood return to make sure the port is in the vein and is not clogged. If your port needs to remain accessed to get medicine continuously (constant infusion), your health care provider will place a clear bandage (dressing) over the needle site. The dressing and needle will need to be changed every week, or as told by your health care provider. What  is flushing? Flushing helps keep the port from getting clogged. Follow instructions from your health care provider about how and when to flush the port. Ports are usually flushed with saline solution or a medicine called heparin. The need for flushing will depend on how the port is used: If the port is only used from time to time to give medicines or draw blood, the port may need to be flushed: Before and after medicines have been given. Before and after blood has been drawn. As part of routine maintenance. Flushing may be recommended every 4-6 weeks. If a constant infusion is running, the port may not need to be flushed. Throw away any syringes in a disposal container that is meant for sharp items (sharps container). You can buy a sharps container from a pharmacy, or you can make one by using an empty hard plastic bottle with a cover. How long will my port stay implanted? The port can stay in for as long as your health care provider thinks it is needed. When it is time for the port to come out, a surgery will be done to remove it. The surgery will be similar to the procedure that was done to put the port in. Follow these instructions at home:  Flush your port as told by your health care provider. If you need an infusion over several days, follow instructions from your health care provider about how   to take care of your port site. Make sure you: Wash your hands with soap and water before you change your dressing. If soap and water are not available, use alcohol-based hand sanitizer. Change your dressing as told by your health care provider. Place any used dressings or infusion bags into a plastic bag. Throw that bag in the trash. Keep the dressing that covers the needle clean and dry. Do not get it wet. Do not use scissors or sharp objects near the tube. Keep the tube clamped, unless it is being used. Check your port site every day for signs of infection. Check for: Redness, swelling, or  pain. Fluid or blood. Pus or a bad smell. Protect the skin around the port site. Avoid wearing bra straps that rub or irritate the site. Protect the skin around your port from seat belts. Place a soft pad over your chest if needed. Bathe or shower as told by your health care provider. The site may get wet as long as you are not actively receiving an infusion. Return to your normal activities as told by your health care provider. Ask your health care provider what activities are safe for you. Carry a medical alert card or wear a medical alert bracelet at all times. This will let health care providers know that you have an implanted port in case of an emergency. Get help right away if: You have redness, swelling, or pain at the port site. You have fluid or blood coming from your port site. You have pus or a bad smell coming from the port site. You have a fever. Summary Implanted ports are usually placed in the chest for long-term IV access. Follow instructions from your health care provider about flushing the port and changing bandages (dressings). Take care of the area around your port by avoiding clothing that puts pressure on the area, and by watching for signs of infection. Protect the skin around your port from seat belts. Place a soft pad over your chest if needed. Get help right away if you have a fever or you have redness, swelling, pain, drainage, or a bad smell at the port site. This information is not intended to replace advice given to you by your health care provider. Make sure you discuss any questions you have with your health care provider. Document Revised: 03/28/2020 Document Reviewed: 05/23/2019 Elsevier Patient Education  2022 [employer]  

## 2020-09-27 NOTE — Progress Notes (Signed)
[place] [phone]   [phone]  PROGRESS NOTE  Patient Care Team: Fanny Bien, MD as PCP - General (Family Medicine)  Hematological/Oncological History # CD30-Positive T-cell Lymphoma, Stage III/IV 07/09/2020: patient underwent cholecystectomy. Liver wedge biopsy during the procedure revealed involvement of a CD30-positive T-cell lymphoproliferative disorder 07/25/2020: establish care with Dr. Lorenso Courier  08/08/2020: PET CT scan showed innumerable hypermetabolic lesions (primarily Deauville 4) in the liver and skeleton. Left neck adenopathy including a dominant level IB lymph node which is Deauville 5. 08/15/2020: Cycle 1 Day 1 of BV-CHP  09/04/2020: Cycle 2 Day 1 of BV-CHP 09/27/2020:  Cycle 3 Day 1 of BV-CHP  Interval History:  Maurice Fox 70 y.o. male with medical history significant for T cell lymphoma (Stage III/IV) presents for a follow up visit. The patient's last visit was on 09/04/2020 for Cycle 2 of BV-CHP.   On exam today Maurice Fox is accompanied by his son. He reports he tolerated his last cycle of chemotherapy well.  He did unfortunately develop a "cold" after being in the chemotherapy room last time.  He notes that the [facility] was up to high and he developed a runny nose fortunately did not experience any nausea, vomiting, or diarrhea.  His appetite has been good he is not currently in any pain.  He notes his energy levels are excellent.  His port has settled in well and is not causing him any trouble.  Otherwise he has noticed no ill effects as a result of the chemotherapy treatments.  Patient denies any fevers, chills, shortness of breath, chest pain or constipation. He has no other complaints.  A full 10 point ROS is listed below.  MEDICAL HISTORY:  Past Medical History:  Diagnosis Date   Arthritis    Hyperlipidemia    Hypertension    followed by pcp  (11-23-2019  per pt had stress test greater than 20 yrs ago, told ok)   Nocturia     OSA on CPAP    per pt uses nightly   Phimosis    Type 2 diabetes mellitus ([place])    followed by pcp   (11-23-2019 per pt does not check blood sugar at home)   Wears glasses     SURGICAL HISTORY: Past Surgical History:  Procedure Laterality Date   APPENDECTOMY  child   CHOLECYSTECTOMY N/A 07/09/2020   Procedure: LAPAROSCOPIC CHOLECYSTECTOMY WITH INTRAOPERATIVE CHOLANGIOGRAM,;  Surgeon: Greer Pickerel, MD;  Location: [facility];  Service: General;  Laterality: N/A;   CIRCUMCISION N/A 11/29/2019   Procedure: CIRCUMCISION ADULT;  Surgeon: [name], MD;  Location: [facility];  Service: Urology;  Laterality: N/A;   IR IMAGING GUIDED PORT INSERTION  08/06/2020   LIVER BIOPSY N/A 07/09/2020   Procedure: LAP LIVER BIOPSY;  Surgeon: Greer Pickerel, MD;  Location: [facility];  Service: General;  Laterality: N/A;   ROTATOR CUFF REPAIR Left chld   SUPRA-UMBILICAL HERNIA N/A 0/31/5945   Procedure: PRIMARY REPAIR SUPRA-UMBILICAL HERNIA;  Surgeon: Greer Pickerel, MD;  Location: [facility];  Service: General;  Laterality: N/A;    SOCIAL HISTORY: Social History   Socioeconomic History   Marital status: Divorced    Spouse name: Not on file   Number of children: Not on file   Years of education: Not on file   Highest education level: Not on file  Occupational History   Not on file  Tobacco Use   Smoking status: Every Day    Packs/day: 1.00  Years: 49.00    Pack years: 49.00    Types: Cigars, Cigarettes   Smokeless tobacco: Never  Vaping Use   Vaping Use: Never used  Substance and Sexual Activity   Alcohol use: Not Currently   Drug use: Never   Sexual activity: Not on file  Other Topics Concern   Not on file  Social History Narrative   Not on file   Social Determinants of Health   Financial Resource Strain: Not on file  Food Insecurity: Not on file  Transportation Needs: Not on file  Physical Activity: Not on file  Stress: Not on file  Social Connections: Not on file   Intimate Partner Violence: Not on file    FAMILY HISTORY: Family History  Problem Relation Age of Onset   Hypotension Mother    Diabetes Mellitus II Father    Prostate cancer Brother    Prostate cancer Brother     ALLERGIES:  has No Known Allergies.  MEDICATIONS:  Current Outpatient Medications  Medication Sig Dispense Refill   amLODipine (NORVASC) 5 MG tablet 1 tablet     atorvastatin (LIPITOR) 10 MG tablet Take 1 tablet by mouth daily.     allopurinol (ZYLOPRIM) 300 MG tablet Take 1 tablet (300 mg total) by mouth daily. 90 tablet 1   aspirin (BAYER ASPIRIN EC LOW DOSE) 81 MG EC tablet 1 tablet     azelastine (ASTELIN) 0.1 % nasal spray 1 puff in each nostril     diclofenac Sodium (VOLTAREN) 1 % GEL Apply 2 g topically 4 (four) times daily as needed for pain.     Empagliflozin-metFORMIN HCl ER 25-1000 MG TB24 Take 1 tablet by mouth at bedtime.     fluticasone (FLONASE) 50 MCG/ACT nasal spray Place 2 sprays into both nostrils in the morning and at bedtime.     lidocaine-prilocaine (EMLA) cream Apply 1 application topically as needed. 30 g 0   methocarbamol (ROBAXIN) 500 MG tablet Take 1 tablet (500 mg total) by mouth every 8 (eight) hours as needed for muscle spasms. 30 tablet 0   Multiple Vitamins-Minerals (CENTRUM SILVER PO) Take 1 tablet by mouth at bedtime.     ondansetron (ZOFRAN) 8 MG tablet Take 1 tablet (8 mg total) by mouth every 8 (eight) hours as needed for nausea or vomiting. 30 tablet 0   predniSONE (DELTASONE) 20 MG tablet Take 3 tablets (60 mg total) by mouth daily with breakfast. 15 tablet 5   prochlorperazine (COMPAZINE) 10 MG tablet Take 1 tablet (10 mg total) by mouth every 6 (six) hours as needed for nausea or vomiting. 30 tablet 0   Propylene Glycol (SYSTANE BALANCE OP) Place 1 drop into both eyes 2 (two) times daily.     sildenafil (VIAGRA) 100 MG tablet Take 100 mg by mouth daily as needed (ED).     tamsulosin (FLOMAX) 0.4 MG CAPS capsule Take 0.4 mg by mouth  at bedtime.     No current facility-administered medications for this visit.   Facility-Administered Medications Ordered in Other Visits  Medication Dose Route Frequency Provider Last Rate Last Admin   acetaminophen (TYLENOL) 325 MG tablet            brentuximab vedotin (ADCETRIS) 170 mg in sodium chloride 0.9 % 100 mL chemo infusion  1.8 mg/kg (Treatment Plan Recorded) Intravenous Once Maurice Fox IV, MD 268 mL/hr at 09/27/20 1432 170 mg at 09/27/20 1432   heparin lock flush 100 unit/mL  500 Units Intracatheter Once PRN Narda Rutherford  T IV, MD       sodium chloride flush (NS) 0.9 % injection 10 mL  10 mL Intracatheter PRN [name], MD        REVIEW OF SYSTEMS:   Constitutional: ( - ) fevers, ( - )  chills , ( - ) night sweats Eyes: ( - ) blurriness of vision, ( - ) double vision, ( - ) watery eyes Ears, nose, mouth, throat, and face: ( - ) mucositis, ( - ) sore throat Respiratory: ( - ) cough, ( - ) dyspnea, ( - ) wheezes Cardiovascular: ( - ) palpitation, ( - ) chest discomfort, ( - ) lower extremity swelling Gastrointestinal:  ( - ) nausea, ( - ) heartburn, ( - ) change in bowel habits Skin: ( - ) abnormal skin rashes Lymphatics: ( - ) new lymphadenopathy, ( - ) easy bruising Neurological: ( - ) numbness, ( - ) tingling, ( - ) new weaknesses Behavioral/Psych: ( - ) mood change, ( - ) new changes  All other systems were reviewed with the patient and are negative.  PHYSICAL EXAMINATION: ECOG PERFORMANCE STATUS: 1 - Symptomatic but completely ambulatory  Vitals:   09/27/20 1115  BP: 135/60  Pulse: 93  Resp: 20  Temp: (!) 97.3 F (36.3 C)  SpO2: 100%    Filed Weights   09/27/20 1115  Weight: 205 lb 8 oz (93.2 kg)   GENERAL: Well-appearing elderly African-American male, alert, no distress and comfortable SKIN: skin color, texture, turgor are normal, no rashes or significant lesions EYES: conjunctiva are pink and non-injected, sclera clear NECK: supple,  non-tender LYMPH: Improving palpable left submandibular lymph node, otherwise no palpable lymphadenopathy in the cervical, axillary or inguinal LUNGS: clear to auscultation and percussion with normal breathing effort HEART: regular rate & rhythm and no murmurs and no lower extremity edema PSYCH: alert & oriented x 3, fluent speech NEURO: no focal motor/sensory deficits  LABORATORY DATA:  I have reviewed the data as listed CBC Latest Ref Rng & Units 09/27/2020 09/04/2020 08/29/2020  WBC 4.0 - 10.5 K/uL 12.7(H) 14.2(H) 18.5(H)  Hemoglobin 13.0 - 17.0 g/dL 10.8(L) 11.7(L) 11.7(L)  Hematocrit 39.0 - 52.0 % 33.0(L) 35.0(L) 34.9(L)  Platelets 150 - 400 K/uL 444(H) 461(H) 364    CMP Latest Ref Rng & Units 09/27/2020 09/04/2020 08/29/2020  Glucose 70 - 99 mg/dL 241(H) 136(H) 142(H)  BUN 8 - 23 mg/dL 26(H) 19 31(H)  Creatinine 0.61 - 1.24 mg/dL 0.81 0.78 0.83  Sodium 135 - 145 mmol/L 139 139 139  Potassium 3.5 - 5.1 mmol/L 4.0 3.7 3.8  Chloride 98 - 111 mmol/L 107 105 105  CO2 22 - 32 mmol/L 22 25 25   Calcium 8.9 - 10.3 mg/dL 9.5 8.8(L) 8.6(L)  Total Protein 6.5 - 8.1 g/dL 7.0 6.9 6.5  Total Bilirubin 0.3 - 1.2 mg/dL 0.4 0.6 0.5  Alkaline Phos 38 - 126 U/L 265(H) 670(H) 762(H)  AST 15 - 41 U/L 16 27 47(H)  ALT 0 - 44 U/L 20 53(H) 87(H)   RADIOGRAPHIC STUDIES: No images were reviewed during this visit.   ASSESSMENT & PLAN Maurice Fox 70 y.o. male with medical history significant for T cell lymphoma (Stage III/IV) presents for a follow up visit.  After review of the labs, review of the records, and discussion with the patient the patients findings are most consistent with a peripheral CD30 positive T-cell lymphoma.  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  The patient has been staged with a PET CT scan which showed clear involvement of the skeleton and liver, most consistent with stage IV disease.  Given these findings we will plan for 6 cycles of BV  CHP chemotherapy.  The regimen of BV CHP consists of brentuximab 1.8 mg/kg IV on day 1, cyclophosphamide 750 mg per metered squared on day 1, doxorubicin 50 mg per metered squared on day 1, and prednisone 60 mg p.o. day 1 through 5.  This is to be continued every 21 days with a plan for up to 6 cycles.  After the third or fourth cycle we will consider restaging PET CT scan in order to evaluate for response.  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 --TTE performed 07/08/2020, shows EF of 50-55% --negative HIV, Hep B and C serologies. --GCSF support on Day 3 of each cycle.  --Patient returns today, 09/27/2020, for Cycle 3 Day 1 of treatment. Labs were reviewed without any intervention needed. LDH has improved from 727 to 212. Liver enzymes and Alk Phos are improving. Patient will proceed with treatment today.  --RTC in 3 weeks for Cycle 4 Day 1 of treatment.   #Supportive Care -- chemotherapy education complete -- port placed .  -- zofran 13m q8H PRN and compazine 166mPO q6H for nausea -- allopurinol 30019mO daily for TLS prophylaxis -- EMLA cream for port -- no pain medication required at this time.   No orders of the defined types were placed in this encounter.  All questions were answered. The patient knows to call the clinic with any problems, questions or concerns.  A total of more than 30 minutes were spent on this encounter with face-to-face time and non-face-to-face time, including preparing to see the patient, ordering medications, counseling the patient and coordination of care as outlined above.   JohLedell PeoplesD Department of Hematology/Oncology [facility]: [phone]: [phone]: [email or web address]  09/27/2020 3:01 PM

## 2020-09-27 NOTE — Telephone Encounter (Signed)
Notified Patient of completion of Cancer Information Form for Short Term Disability. Fax transmission confirmation received. Request for medical records forwarded to [employer] with signed Release of Information Form.

## 2020-09-27 NOTE — Patient Instructions (Signed)
[facility]  Discharge Instructions: Thank you for choosing [facility] to provide your oncology and hematology care.   If you have a lab appointment with the [facility], please go directly to the [facility] and check in at the registration area.   Wear comfortable clothing and clothing appropriate for easy access to any Portacath or PICC line.   We strive to give you quality time with your provider. You may need to reschedule your appointment if you arrive late (15 or more minutes).  Arriving late affects you and other patients whose appointments are after yours.  Also, if you miss three or more appointments without notifying the office, you may be dismissed from the clinic at the provider's discretion.      For prescription refill requests, have your pharmacy contact our office and allow 72 hours for refills to be completed.    Today you received the following chemotherapy and/or immunotherapy agents Doxorubicin, Cytoxan, and Brentuximab      To help prevent nausea and vomiting after your treatment, we encourage you to take your nausea medication as directed.  BELOW ARE SYMPTOMS THAT SHOULD BE REPORTED IMMEDIATELY: *FEVER GREATER THAN 100.4 F (38 C) OR HIGHER *CHILLS OR SWEATING *NAUSEA AND VOMITING THAT IS NOT CONTROLLED WITH YOUR NAUSEA MEDICATION *UNUSUAL SHORTNESS OF BREATH *UNUSUAL BRUISING OR BLEEDING *URINARY PROBLEMS (pain or burning when urinating, or frequent urination) *BOWEL PROBLEMS (unusual diarrhea, constipation, pain near the anus) TENDERNESS IN MOUTH AND THROAT WITH OR WITHOUT PRESENCE OF ULCERS (sore throat, sores in mouth, or a toothache) UNUSUAL RASH, SWELLING OR PAIN  UNUSUAL VAGINAL DISCHARGE OR ITCHING   Items with * indicate a potential emergency and should be followed up as soon as possible or go to the Emergency Department if any problems should occur.  Please show the CHEMOTHERAPY ALERT CARD or IMMUNOTHERAPY  ALERT CARD at check-in to the Emergency Department and triage nurse.  Should you have questions after your visit or need to cancel or reschedule your appointment, please contact [facility]  Dept: [phone]  and follow the prompts.  Office hours are 8:00 a.m. to 4:30 p.m. Monday - Friday. Please note that voicemails left after 4:00 p.m. may not be returned until the following business day.  We are closed weekends and major holidays. You have access to a nurse at all times for urgent questions. Please call the main number to the clinic Dept: [phone] and follow the prompts.   For any non-urgent questions, you may also contact your provider using MyChart. We now offer e-Visits for anyone 57 and older to request care online for non-urgent symptoms. For details visit [email or web address].   Also download the MyChart app! Go to the app store, search "MyChart", open the app, select [facility], and log in with your MyChart username and password.  Due to Covid, a mask is required upon entering the hospital/clinic. If you do not have a mask, one will be given to you upon arrival. For doctor visits, patients may have 1 support person aged 60 or older with them. For treatment visits, patients cannot have anyone with them due to current Covid guidelines and our immunocompromised population.

## 2020-09-29 ENCOUNTER — Inpatient Hospital Stay: Payer: Medicare HMO

## 2020-09-29 ENCOUNTER — Other Ambulatory Visit: Payer: Self-pay

## 2020-09-29 VITALS — BP 158/68 | HR 106 | Temp 97.9°F | Resp 18

## 2020-09-29 DIAGNOSIS — Z5112 Encounter for antineoplastic immunotherapy: Secondary | ICD-10-CM | POA: Diagnosis not present

## 2020-09-29 DIAGNOSIS — Z5111 Encounter for antineoplastic chemotherapy: Secondary | ICD-10-CM | POA: Diagnosis not present

## 2020-09-29 DIAGNOSIS — C8499 Mature T/NK-cell lymphomas, unspecified, extranodal and solid organ sites: Secondary | ICD-10-CM | POA: Diagnosis not present

## 2020-09-29 DIAGNOSIS — C8442 Peripheral T-cell lymphoma, not classified, intrathoracic lymph nodes: Secondary | ICD-10-CM

## 2020-09-29 DIAGNOSIS — F17210 Nicotine dependence, cigarettes, uncomplicated: Secondary | ICD-10-CM | POA: Diagnosis not present

## 2020-09-29 DIAGNOSIS — Z5189 Encounter for other specified aftercare: Secondary | ICD-10-CM | POA: Diagnosis not present

## 2020-09-29 DIAGNOSIS — I10 Essential (primary) hypertension: Secondary | ICD-10-CM | POA: Diagnosis not present

## 2020-09-29 DIAGNOSIS — Z7982 Long term (current) use of aspirin: Secondary | ICD-10-CM | POA: Diagnosis not present

## 2020-09-29 DIAGNOSIS — Z79899 Other long term (current) drug therapy: Secondary | ICD-10-CM | POA: Diagnosis not present

## 2020-09-29 DIAGNOSIS — E119 Type 2 diabetes mellitus without complications: Secondary | ICD-10-CM | POA: Diagnosis not present

## 2020-09-29 MED ORDER — PEGFILGRASTIM-CBQV 6 MG/0.6ML ~~LOC~~ SOSY
6.0000 mg | PREFILLED_SYRINGE | Freq: Once | SUBCUTANEOUS | Status: AC
  Administered 2020-09-29: 6 mg via SUBCUTANEOUS
  Filled 2020-09-29: qty 0.6

## 2020-10-18 ENCOUNTER — Inpatient Hospital Stay (HOSPITAL_BASED_OUTPATIENT_CLINIC_OR_DEPARTMENT_OTHER): Payer: Medicare HMO | Admitting: Hematology and Oncology

## 2020-10-18 ENCOUNTER — Inpatient Hospital Stay: Payer: Medicare HMO

## 2020-10-18 ENCOUNTER — Other Ambulatory Visit: Payer: Self-pay

## 2020-10-18 VITALS — BP 124/67 | HR 117 | Temp 97.6°F | Resp 18 | Wt 205.4 lb

## 2020-10-18 VITALS — HR 114

## 2020-10-18 DIAGNOSIS — I10 Essential (primary) hypertension: Secondary | ICD-10-CM | POA: Diagnosis not present

## 2020-10-18 DIAGNOSIS — Z5112 Encounter for antineoplastic immunotherapy: Secondary | ICD-10-CM

## 2020-10-18 DIAGNOSIS — Z7982 Long term (current) use of aspirin: Secondary | ICD-10-CM | POA: Diagnosis not present

## 2020-10-18 DIAGNOSIS — C8442 Peripheral T-cell lymphoma, not classified, intrathoracic lymph nodes: Secondary | ICD-10-CM

## 2020-10-18 DIAGNOSIS — Z5111 Encounter for antineoplastic chemotherapy: Secondary | ICD-10-CM | POA: Diagnosis not present

## 2020-10-18 DIAGNOSIS — E119 Type 2 diabetes mellitus without complications: Secondary | ICD-10-CM | POA: Diagnosis not present

## 2020-10-18 DIAGNOSIS — C8499 Mature T/NK-cell lymphomas, unspecified, extranodal and solid organ sites: Secondary | ICD-10-CM | POA: Diagnosis not present

## 2020-10-18 DIAGNOSIS — F17210 Nicotine dependence, cigarettes, uncomplicated: Secondary | ICD-10-CM | POA: Diagnosis not present

## 2020-10-18 DIAGNOSIS — Z5189 Encounter for other specified aftercare: Secondary | ICD-10-CM | POA: Diagnosis not present

## 2020-10-18 DIAGNOSIS — Z95828 Presence of other vascular implants and grafts: Secondary | ICD-10-CM

## 2020-10-18 DIAGNOSIS — Z79899 Other long term (current) drug therapy: Secondary | ICD-10-CM | POA: Diagnosis not present

## 2020-10-18 LAB — CBC WITH DIFFERENTIAL (CANCER CENTER ONLY)
Abs Immature Granulocytes: 0.03 10*3/uL (ref 0.00–0.07)
Basophils Absolute: 0.1 10*3/uL (ref 0.0–0.1)
Basophils Relative: 1 %
Eosinophils Absolute: 0.1 10*3/uL (ref 0.0–0.5)
Eosinophils Relative: 1 %
HCT: 36.1 % — ABNORMAL LOW (ref 39.0–52.0)
Hemoglobin: 11.7 g/dL — ABNORMAL LOW (ref 13.0–17.0)
Immature Granulocytes: 0 %
Lymphocytes Relative: 7 %
Lymphs Abs: 0.5 10*3/uL — ABNORMAL LOW (ref 0.7–4.0)
MCH: 29.7 pg (ref 26.0–34.0)
MCHC: 32.4 g/dL (ref 30.0–36.0)
MCV: 91.6 fL (ref 80.0–100.0)
Monocytes Absolute: 0.2 10*3/uL (ref 0.1–1.0)
Monocytes Relative: 3 %
Neutro Abs: 7.2 10*3/uL (ref 1.7–7.7)
Neutrophils Relative %: 88 %
Platelet Count: 330 10*3/uL (ref 150–400)
RBC: 3.94 MIL/uL — ABNORMAL LOW (ref 4.22–5.81)
RDW: 22.4 % — ABNORMAL HIGH (ref 11.5–15.5)
WBC Count: 8.1 10*3/uL (ref 4.0–10.5)
nRBC: 0 % (ref 0.0–0.2)

## 2020-10-18 LAB — CMP (CANCER CENTER ONLY)
ALT: 18 U/L (ref 0–44)
AST: 21 U/L (ref 15–41)
Albumin: 3.9 g/dL (ref 3.5–5.0)
Alkaline Phosphatase: 165 U/L — ABNORMAL HIGH (ref 38–126)
Anion gap: 13 (ref 5–15)
BUN: 25 mg/dL — ABNORMAL HIGH (ref 8–23)
CO2: 22 mmol/L (ref 22–32)
Calcium: 9.5 mg/dL (ref 8.9–10.3)
Chloride: 106 mmol/L (ref 98–111)
Creatinine: 0.91 mg/dL (ref 0.61–1.24)
GFR, Estimated: >60 mL/min (ref >60)
Glucose, Bld: 194 mg/dL — ABNORMAL HIGH (ref 70–99)
Potassium: 3.5 mmol/L (ref 3.5–5.1)
Sodium: 141 mmol/L (ref 135–145)
Total Bilirubin: 0.6 mg/dL (ref 0.3–1.2)
Total Protein: 7.1 g/dL (ref 6.5–8.1)

## 2020-10-18 LAB — URIC ACID: Uric Acid, Serum: 3.9 mg/dL (ref 3.7–8.6)

## 2020-10-18 LAB — LACTATE DEHYDROGENASE: LDH: 202 U/L — ABNORMAL HIGH (ref 98–192)

## 2020-10-18 MED ORDER — SODIUM CHLORIDE 0.9 % IV SOLN
1.8000 mg/kg | Freq: Once | INTRAVENOUS | Status: AC
  Administered 2020-10-18: 170 mg via INTRAVENOUS
  Filled 2020-10-18: qty 34

## 2020-10-18 MED ORDER — SODIUM CHLORIDE 0.9 % IV SOLN: Freq: Once | INTRAVENOUS | Status: AC

## 2020-10-18 MED ORDER — ACETAMINOPHEN 325 MG PO TABS
650.0000 mg | ORAL_TABLET | Freq: Once | ORAL | Status: AC
  Administered 2020-10-18: 650 mg via ORAL
  Filled 2020-10-18: qty 2

## 2020-10-18 MED ORDER — PALONOSETRON HCL INJECTION 0.25 MG/5ML
0.2500 mg | Freq: Once | INTRAVENOUS | Status: AC
  Administered 2020-10-18: 0.25 mg via INTRAVENOUS
  Filled 2020-10-18: qty 5

## 2020-10-18 MED ORDER — SODIUM CHLORIDE 0.9% FLUSH: 10.0000 mL | Freq: Once | INTRAVENOUS | Status: DC

## 2020-10-18 MED ORDER — SODIUM CHLORIDE 0.9 % IV SOLN
150.0000 mg | Freq: Once | INTRAVENOUS | Status: AC
  Administered 2020-10-18: 150 mg via INTRAVENOUS
  Filled 2020-10-18: qty 150

## 2020-10-18 MED ORDER — SODIUM CHLORIDE 0.9 % IV SOLN
10.0000 mg | Freq: Once | INTRAVENOUS | Status: AC
  Administered 2020-10-18: 10 mg via INTRAVENOUS
  Filled 2020-10-18: qty 10

## 2020-10-18 MED ORDER — DOXORUBICIN HCL CHEMO IV INJECTION 2 MG/ML
50.0000 mg/m2 | Freq: Once | INTRAVENOUS | Status: AC
  Administered 2020-10-18: 108 mg via INTRAVENOUS
  Filled 2020-10-18: qty 54

## 2020-10-18 MED ORDER — DIPHENHYDRAMINE HCL 50 MG/ML IJ SOLN
50.0000 mg | Freq: Once | INTRAMUSCULAR | Status: AC
  Administered 2020-10-18: 50 mg via INTRAVENOUS
  Filled 2020-10-18: qty 1

## 2020-10-18 MED ORDER — SODIUM CHLORIDE 0.9% FLUSH
10.0000 mL | INTRAVENOUS | Status: DC | PRN
  Administered 2020-10-18: 10 mL

## 2020-10-18 MED ORDER — HEPARIN SOD (PORK) LOCK FLUSH 100 UNIT/ML IV SOLN
500.0000 [IU] | Freq: Once | INTRAVENOUS | Status: AC | PRN
  Administered 2020-10-18: 500 [IU]

## 2020-10-18 MED ORDER — SODIUM CHLORIDE 0.9 % IV SOLN
750.0000 mg/m2 | Freq: Once | INTRAVENOUS | Status: AC
  Administered 2020-10-18: 1620 mg via INTRAVENOUS
  Filled 2020-10-18: qty 81

## 2020-10-18 NOTE — Progress Notes (Signed)
Per Dr. Lorenso Courier, ok to proceed with elevated heart rate.

## 2020-10-18 NOTE — Patient Instructions (Signed)
[facility]  Discharge Instructions: Thank you for choosing [facility] to provide your oncology and hematology care.   If you have a lab appointment with the [facility], please go directly to the [facility] and check in at the registration area.   Wear comfortable clothing and clothing appropriate for easy access to any Portacath or PICC line.   We strive to give you quality time with your provider. You may need to reschedule your appointment if you arrive late (15 or more minutes).  Arriving late affects you and other patients whose appointments are after yours.  Also, if you miss three or more appointments without notifying the office, you may be dismissed from the clinic at the provider's discretion.      For prescription refill requests, have your pharmacy contact our office and allow 72 hours for refills to be completed.    Today you received the following chemotherapy and/or immunotherapy agents Doxorubicin, Cytoxan, and Brentuximab      To help prevent nausea and vomiting after your treatment, we encourage you to take your nausea medication as directed.  BELOW ARE SYMPTOMS THAT SHOULD BE REPORTED IMMEDIATELY: *FEVER GREATER THAN 100.4 F (38 C) OR HIGHER *CHILLS OR SWEATING *NAUSEA AND VOMITING THAT IS NOT CONTROLLED WITH YOUR NAUSEA MEDICATION *UNUSUAL SHORTNESS OF BREATH *UNUSUAL BRUISING OR BLEEDING *URINARY PROBLEMS (pain or burning when urinating, or frequent urination) *BOWEL PROBLEMS (unusual diarrhea, constipation, pain near the anus) TENDERNESS IN MOUTH AND THROAT WITH OR WITHOUT PRESENCE OF ULCERS (sore throat, sores in mouth, or a toothache) UNUSUAL RASH, SWELLING OR PAIN  UNUSUAL VAGINAL DISCHARGE OR ITCHING   Items with * indicate a potential emergency and should be followed up as soon as possible or go to the Emergency Department if any problems should occur.  Please show the CHEMOTHERAPY ALERT CARD or IMMUNOTHERAPY  ALERT CARD at check-in to the Emergency Department and triage nurse.  Should you have questions after your visit or need to cancel or reschedule your appointment, please contact [facility]  Dept: [phone]  and follow the prompts.  Office hours are 8:00 a.m. to 4:30 p.m. Monday - Friday. Please note that voicemails left after 4:00 p.m. may not be returned until the following business day.  We are closed weekends and major holidays. You have access to a nurse at all times for urgent questions. Please call the main number to the clinic Dept: [phone] and follow the prompts.   For any non-urgent questions, you may also contact your provider using MyChart. We now offer e-Visits for anyone 57 and older to request care online for non-urgent symptoms. For details visit [email or web address].   Also download the MyChart app! Go to the app store, search "MyChart", open the app, select [facility], and log in with your MyChart username and password.  Due to Covid, a mask is required upon entering the hospital/clinic. If you do not have a mask, one will be given to you upon arrival. For doctor visits, patients may have 1 support person aged 60 or older with them. For treatment visits, patients cannot have anyone with them due to current Covid guidelines and our immunocompromised population.

## 2020-10-19 ENCOUNTER — Encounter: Payer: Self-pay | Admitting: Hematology and Oncology

## 2020-10-19 NOTE — Progress Notes (Signed)
[name] [phone]   [phone]  PROGRESS NOTE  Patient Care Team: Fanny Bien, MD as PCP - General (Family Medicine)  Hematological/Oncological History # CD30-Positive T-cell Lymphoma, Stage III/IV 07/09/2020: patient underwent cholecystectomy. Liver wedge biopsy during the procedure revealed involvement of a CD30-positive T-cell lymphoproliferative disorder 07/25/2020: establish care with Dr. Lorenso Courier  08/08/2020: PET CT scan showed innumerable hypermetabolic lesions (primarily Deauville 4) in the liver and skeleton. Left neck adenopathy including a dominant level IB lymph node which is Deauville 5. 08/15/2020: Cycle 1 Day 1 of BV-CHP  09/04/2020: Cycle 2 Day 1 of BV-CHP 09/27/2020:  Cycle 3 Day 1 of BV-CHP 10/18/2020: Cycle 4 Day 1 of BV-CHP  Interval History:  Maurice Fox 70 y.o. male with medical history significant for T cell lymphoma (Stage III/IV) presents for a follow up visit. The patient's last visit was on 09/27/2020 for Cycle 3 of BV-CHP.   On exam today Maurice Fox is accompanied by his son. He reports he tolerated his last cycle of chemotherapy well.  He has been having some difficulty with sleeping and notes that he does not sleep much more than 2 hours at a time.  He notes that he wakes up several times throughout the night.  He is unsure what the cause of this is.  He notes that his back has been sore but he has not noticed any clear side effects as result of the chemotherapy treatments.  Otherwise he has noticed no ill effects as a result of the chemotherapy treatments.  Patient denies any fevers, chills, shortness of breath, chest pain or constipation. He has no other complaints.  A full 10 point ROS is listed below.  MEDICAL HISTORY:  Past Medical History:  Diagnosis Date   Arthritis    Hyperlipidemia    Hypertension    followed by pcp  (11-23-2019  per pt had stress test greater than 20 yrs ago, told ok)   Nocturia    OSA  on CPAP    per pt uses nightly   Phimosis    Type 2 diabetes mellitus ([place])    followed by pcp   (11-23-2019 per pt does not check blood sugar at home)   Wears glasses     SURGICAL HISTORY: Past Surgical History:  Procedure Laterality Date   APPENDECTOMY  child   CHOLECYSTECTOMY N/A 07/09/2020   Procedure: LAPAROSCOPIC CHOLECYSTECTOMY WITH INTRAOPERATIVE CHOLANGIOGRAM,;  Surgeon: Greer Pickerel, MD;  Location: [facility];  Service: General;  Laterality: N/A;   CIRCUMCISION N/A 11/29/2019   Procedure: CIRCUMCISION ADULT;  Surgeon: [name], MD;  Location: [facility];  Service: Urology;  Laterality: N/A;   IR IMAGING GUIDED PORT INSERTION  08/06/2020   LIVER BIOPSY N/A 07/09/2020   Procedure: LAP LIVER BIOPSY;  Surgeon: Greer Pickerel, MD;  Location: [facility];  Service: General;  Laterality: N/A;   ROTATOR CUFF REPAIR Left chld   SUPRA-UMBILICAL HERNIA N/A 5/97/4163   Procedure: PRIMARY REPAIR SUPRA-UMBILICAL HERNIA;  Surgeon: Greer Pickerel, MD;  Location: [facility];  Service: General;  Laterality: N/A;    SOCIAL HISTORY: Social History   Socioeconomic History   Marital status: Divorced    Spouse name: Not on file   Number of children: Not on file   Years of education: Not on file   Highest education level: Not on file  Occupational History   Not on file  Tobacco Use   Smoking status: Every Day    Packs/day: 1.00  Years: 49.00    Pack years: 49.00    Types: Cigars, Cigarettes   Smokeless tobacco: Never  Vaping Use   Vaping Use: Never used  Substance and Sexual Activity   Alcohol use: Not Currently   Drug use: Never   Sexual activity: Not on file  Other Topics Concern   Not on file  Social History Narrative   Not on file   Social Determinants of Health   Financial Resource Strain: Not on file  Food Insecurity: Not on file  Transportation Needs: Not on file  Physical Activity: Not on file  Stress: Not on file  Social Connections: Not on file  Intimate  Partner Violence: Not on file    FAMILY HISTORY: Family History  Problem Relation Age of Onset   Hypotension Mother    Diabetes Mellitus II Father    Prostate cancer Brother    Prostate cancer Brother     ALLERGIES:  has No Known Allergies.  MEDICATIONS:  Current Outpatient Medications  Medication Sig Dispense Refill   allopurinol (ZYLOPRIM) 300 MG tablet Take 1 tablet (300 mg total) by mouth daily. 90 tablet 1   amLODipine (NORVASC) 5 MG tablet 1 tablet     aspirin (BAYER ASPIRIN EC LOW DOSE) 81 MG EC tablet 1 tablet     atorvastatin (LIPITOR) 10 MG tablet Take 1 tablet by mouth daily.     azelastine (ASTELIN) 0.1 % nasal spray 1 puff in each nostril     diclofenac Sodium (VOLTAREN) 1 % GEL Apply 2 g topically 4 (four) times daily as needed for pain.     Empagliflozin-metFORMIN HCl ER 25-1000 MG TB24 Take 1 tablet by mouth at bedtime.     fluticasone (FLONASE) 50 MCG/ACT nasal spray Place 2 sprays into both nostrils in the morning and at bedtime.     lidocaine-prilocaine (EMLA) cream Apply 1 application topically as needed. 30 g 0   methocarbamol (ROBAXIN) 500 MG tablet Take 1 tablet (500 mg total) by mouth every 8 (eight) hours as needed for muscle spasms. 30 tablet 0   Multiple Vitamins-Minerals (CENTRUM SILVER PO) Take 1 tablet by mouth at bedtime.     ondansetron (ZOFRAN) 8 MG tablet Take 1 tablet (8 mg total) by mouth every 8 (eight) hours as needed for nausea or vomiting. 30 tablet 0   predniSONE (DELTASONE) 20 MG tablet Take 3 tablets (60 mg total) by mouth daily with breakfast. 15 tablet 5   prochlorperazine (COMPAZINE) 10 MG tablet Take 1 tablet (10 mg total) by mouth every 6 (six) hours as needed for nausea or vomiting. 30 tablet 0   Propylene Glycol (SYSTANE BALANCE OP) Place 1 drop into both eyes 2 (two) times daily.     sildenafil (VIAGRA) 100 MG tablet Take 100 mg by mouth daily as needed (ED).     tamsulosin (FLOMAX) 0.4 MG CAPS capsule Take 0.4 mg by mouth at  bedtime.     No current facility-administered medications for this visit.    REVIEW OF SYSTEMS:   Constitutional: ( - ) fevers, ( - )  chills , ( - ) night sweats Eyes: ( - ) blurriness of vision, ( - ) double vision, ( - ) watery eyes Ears, nose, mouth, throat, and face: ( - ) mucositis, ( - ) sore throat Respiratory: ( - ) cough, ( - ) dyspnea, ( - ) wheezes Cardiovascular: ( - ) palpitation, ( - ) chest discomfort, ( - ) lower extremity swelling Gastrointestinal:  ( - )  nausea, ( - ) heartburn, ( - ) change in bowel habits Skin: ( - ) abnormal skin rashes Lymphatics: ( - ) new lymphadenopathy, ( - ) easy bruising Neurological: ( - ) numbness, ( - ) tingling, ( - ) new weaknesses Behavioral/Psych: ( - ) mood change, ( - ) new changes  All other systems were reviewed with the patient and are negative.  PHYSICAL EXAMINATION: ECOG PERFORMANCE STATUS: 1 - Symptomatic but completely ambulatory  Vitals:   10/18/20 1144  BP: 124/67  Pulse: (!) 117  Resp: 18  Temp: 97.6 F (36.4 C)  SpO2: 96%    Filed Weights   10/18/20 1144  Weight: 205 lb 6 oz (93.2 kg)   GENERAL: Well-appearing elderly African-American male, alert, no distress and comfortable SKIN: skin color, texture, turgor are normal, no rashes or significant lesions EYES: conjunctiva are pink and non-injected, sclera clear NECK: supple, non-tender LYMPH: Improving palpable left submandibular lymph node, otherwise no palpable lymphadenopathy in the cervical, axillary or inguinal LUNGS: clear to auscultation and percussion with normal breathing effort HEART: regular rate & rhythm and no murmurs and no lower extremity edema PSYCH: alert & oriented x 3, fluent speech NEURO: no focal motor/sensory deficits  LABORATORY DATA:  I have reviewed the data as listed CBC Latest Ref Rng & Units 10/18/2020 09/27/2020 09/04/2020  WBC 4.0 - 10.5 K/uL 8.1 12.7(H) 14.2(H)  Hemoglobin 13.0 - 17.0 g/dL 11.7(L) 10.8(L) 11.7(L)  Hematocrit  39.0 - 52.0 % 36.1(L) 33.0(L) 35.0(L)  Platelets 150 - 400 K/uL 330 444(H) 461(H)    CMP Latest Ref Rng & Units 10/18/2020 09/27/2020 09/04/2020  Glucose 70 - 99 mg/dL 194(H) 241(H) 136(H)  BUN 8 - 23 mg/dL 25(H) 26(H) 19  Creatinine 0.61 - 1.24 mg/dL 0.91 0.81 0.78  Sodium 135 - 145 mmol/L 141 139 139  Potassium 3.5 - 5.1 mmol/L 3.5 4.0 3.7  Chloride 98 - 111 mmol/L 106 107 105  CO2 22 - 32 mmol/L 22 22 25   Calcium 8.9 - 10.3 mg/dL 9.5 9.5 8.8(L)  Total Protein 6.5 - 8.1 g/dL 7.1 7.0 6.9  Total Bilirubin 0.3 - 1.2 mg/dL 0.6 0.4 0.6  Alkaline Phos 38 - 126 U/L 165(H) 265(H) 670(H)  AST 15 - 41 U/L 21 16 27   ALT 0 - 44 U/L 18 20 53(H)   RADIOGRAPHIC STUDIES: No images were reviewed during this visit.   ASSESSMENT & PLAN Maurice Fox 70 y.o. male with medical history significant for T cell lymphoma (Stage III/IV) presents for a follow up visit.  After review of the labs, review of the records, and discussion with the patient the patients findings are most consistent with a peripheral CD30 positive T-cell lymphoma.  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  The patient has been staged with a PET CT scan which showed clear involvement of the skeleton and liver, most consistent with stage IV disease.  Given these findings we will plan for 6 cycles of BV CHP chemotherapy.  The regimen of BV CHP consists of brentuximab 1.8 mg/kg IV on day 1, cyclophosphamide 750 mg per metered squared on day 1, doxorubicin 50 mg per metered squared on day 1, and prednisone 60 mg p.o. day 1 through 5.  This is to be continued every 21 days with a plan for up to 6 cycles.  After the third or fourth cycle we will consider restaging PET CT scan in order to evaluate for response.  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 --TTE performed 07/08/2020, shows EF of 50-55% --negative HIV, Hep B and C serologies. --GCSF support on Day 3 of each cycle.  --Patient returns today, 09/27/2020, for Cycle 3 Day 1 of treatment. Labs were reviewed without any intervention needed. LDH, Liver enzymes and Alk Phos are improving. Patient will proceed with treatment today.  --PET CT scan due at this time. --RTC in 3 weeks for Cycle 5 Day 1 of treatment.   #Chemotherapy Induced Anemia --Hgb 11.7, stable --continue to monitor   #Supportive Care -- chemotherapy education complete -- port placed .  -- zofran 23m q8H PRN and compazine 173mPO q6H for nausea -- allopurinol 30027mO daily for TLS prophylaxis -- EMLA cream for port -- no pain medication required at this time.   Orders Placed This Encounter  Procedures   NM PET Image Restag (PS) Skull Base To Thigh    Standing Status:   Future    Standing Expiration Date:   10/19/2021    Order Specific Question:   If indicated for the ordered procedure, I authorize the administration of a radiopharmaceutical per Radiology protocol    Answer:   Yes    Order Specific Question:   Preferred imaging location?    Answer:   [facility] All questions were answered. The patient knows to call the clinic with any problems, questions or concerns.  A total of more than 30 minutes were spent on this encounter with face-to-face time and non-face-to-face time, including preparing to see the patient, ordering medications, counseling the patient and coordination of care as outlined above.   JohLedell PeoplesD Department of Hematology/Oncology [facility]: [phone]: [phone]: [email or web address]  10/19/2020 9:23 AM

## 2020-10-20 ENCOUNTER — Inpatient Hospital Stay: Payer: Medicare HMO | Attending: Hematology and Oncology

## 2020-10-20 ENCOUNTER — Other Ambulatory Visit: Payer: Self-pay

## 2020-10-20 VITALS — BP 176/79 | HR 102 | Temp 97.9°F | Resp 18

## 2020-10-20 DIAGNOSIS — C8499 Mature T/NK-cell lymphomas, unspecified, extranodal and solid organ sites: Secondary | ICD-10-CM | POA: Diagnosis not present

## 2020-10-20 DIAGNOSIS — Z1589 Genetic susceptibility to other disease: Secondary | ICD-10-CM | POA: Insufficient documentation

## 2020-10-20 DIAGNOSIS — F17210 Nicotine dependence, cigarettes, uncomplicated: Secondary | ICD-10-CM | POA: Insufficient documentation

## 2020-10-20 DIAGNOSIS — Z79899 Other long term (current) drug therapy: Secondary | ICD-10-CM | POA: Diagnosis not present

## 2020-10-20 DIAGNOSIS — C8442 Peripheral T-cell lymphoma, not classified, intrathoracic lymph nodes: Secondary | ICD-10-CM

## 2020-10-20 DIAGNOSIS — Z7982 Long term (current) use of aspirin: Secondary | ICD-10-CM | POA: Diagnosis not present

## 2020-10-20 DIAGNOSIS — Z5112 Encounter for antineoplastic immunotherapy: Secondary | ICD-10-CM | POA: Diagnosis not present

## 2020-10-20 DIAGNOSIS — Z5111 Encounter for antineoplastic chemotherapy: Secondary | ICD-10-CM | POA: Diagnosis not present

## 2020-10-20 DIAGNOSIS — D6481 Anemia due to antineoplastic chemotherapy: Secondary | ICD-10-CM | POA: Insufficient documentation

## 2020-10-20 DIAGNOSIS — T451X5A Adverse effect of antineoplastic and immunosuppressive drugs, initial encounter: Secondary | ICD-10-CM | POA: Insufficient documentation

## 2020-10-20 MED ORDER — PEGFILGRASTIM-CBQV 6 MG/0.6ML ~~LOC~~ SOSY: 6.0000 mg | PREFILLED_SYRINGE | Freq: Once | SUBCUTANEOUS | Status: DC

## 2020-10-20 MED ORDER — PEGFILGRASTIM-CBQV 6 MG/0.6ML ~~LOC~~ SOSY
PREFILLED_SYRINGE | SUBCUTANEOUS | Status: AC
  Administered 2020-10-20: 6 mg
  Filled 2020-10-20: qty 0.6

## 2020-10-22 ENCOUNTER — Encounter: Payer: Self-pay | Admitting: Hematology and Oncology

## 2020-10-22 DIAGNOSIS — E6609 Other obesity due to excess calories: Secondary | ICD-10-CM | POA: Diagnosis not present

## 2020-10-22 DIAGNOSIS — E1165 Type 2 diabetes mellitus with hyperglycemia: Secondary | ICD-10-CM | POA: Diagnosis not present

## 2020-10-22 DIAGNOSIS — Z720 Tobacco use: Secondary | ICD-10-CM | POA: Diagnosis not present

## 2020-10-22 DIAGNOSIS — I10 Essential (primary) hypertension: Secondary | ICD-10-CM | POA: Diagnosis not present

## 2020-10-22 DIAGNOSIS — C8590 Non-Hodgkin lymphoma, unspecified, unspecified site: Secondary | ICD-10-CM | POA: Diagnosis not present

## 2020-11-05 ENCOUNTER — Ambulatory Visit (HOSPITAL_COMMUNITY)
Admission: RE | Admit: 2020-11-05 | Discharge: 2020-11-05 | Disposition: A | Discharge status: Home / Self Care | Payer: Medicare HMO | Source: Ambulatory Visit | Attending: Hematology and Oncology | Admitting: Hematology and Oncology

## 2020-11-05 ENCOUNTER — Other Ambulatory Visit: Payer: Self-pay

## 2020-11-05 DIAGNOSIS — K7689 Other specified diseases of liver: Secondary | ICD-10-CM | POA: Diagnosis not present

## 2020-11-05 DIAGNOSIS — G35 Multiple sclerosis: Secondary | ICD-10-CM | POA: Diagnosis not present

## 2020-11-05 DIAGNOSIS — C8442 Peripheral T-cell lymphoma, not classified, intrathoracic lymph nodes: Secondary | ICD-10-CM | POA: Insufficient documentation

## 2020-11-05 DIAGNOSIS — R590 Localized enlarged lymph nodes: Secondary | ICD-10-CM | POA: Diagnosis not present

## 2020-11-05 DIAGNOSIS — Z08 Encounter for follow-up examination after completed treatment for malignant neoplasm: Secondary | ICD-10-CM | POA: Insufficient documentation

## 2020-11-05 DIAGNOSIS — C8448 Peripheral T-cell lymphoma, not classified, lymph nodes of multiple sites: Secondary | ICD-10-CM | POA: Diagnosis not present

## 2020-11-05 LAB — GLUCOSE, CAPILLARY: Glucose-Capillary: 113 mg/dL — ABNORMAL HIGH (ref 70–99)

## 2020-11-05 MED ORDER — FLUDEOXYGLUCOSE F - 18 (FDG) INJECTION
10.0000 | Freq: Once | INTRAVENOUS | Status: AC | PRN
  Administered 2020-11-05: 11.02 via INTRAVENOUS

## 2020-11-08 ENCOUNTER — Other Ambulatory Visit: Payer: Self-pay

## 2020-11-08 ENCOUNTER — Inpatient Hospital Stay (HOSPITAL_BASED_OUTPATIENT_CLINIC_OR_DEPARTMENT_OTHER): Payer: Medicare HMO | Admitting: Hematology and Oncology

## 2020-11-08 ENCOUNTER — Inpatient Hospital Stay: Payer: Medicare HMO

## 2020-11-08 VITALS — HR 98

## 2020-11-08 VITALS — BP 150/70 | HR 109 | Temp 98.0°F | Resp 18 | Ht 70.0 in | Wt 212.4 lb

## 2020-11-08 DIAGNOSIS — C8442 Peripheral T-cell lymphoma, not classified, intrathoracic lymph nodes: Secondary | ICD-10-CM

## 2020-11-08 DIAGNOSIS — Z5111 Encounter for antineoplastic chemotherapy: Secondary | ICD-10-CM

## 2020-11-08 DIAGNOSIS — Z1589 Genetic susceptibility to other disease: Secondary | ICD-10-CM | POA: Diagnosis not present

## 2020-11-08 DIAGNOSIS — D6481 Anemia due to antineoplastic chemotherapy: Secondary | ICD-10-CM | POA: Diagnosis not present

## 2020-11-08 DIAGNOSIS — Z79899 Other long term (current) drug therapy: Secondary | ICD-10-CM | POA: Diagnosis not present

## 2020-11-08 DIAGNOSIS — Z95828 Presence of other vascular implants and grafts: Secondary | ICD-10-CM | POA: Diagnosis not present

## 2020-11-08 DIAGNOSIS — Z7982 Long term (current) use of aspirin: Secondary | ICD-10-CM | POA: Diagnosis not present

## 2020-11-08 DIAGNOSIS — Z5112 Encounter for antineoplastic immunotherapy: Secondary | ICD-10-CM | POA: Diagnosis not present

## 2020-11-08 DIAGNOSIS — T451X5A Adverse effect of antineoplastic and immunosuppressive drugs, initial encounter: Secondary | ICD-10-CM | POA: Diagnosis not present

## 2020-11-08 DIAGNOSIS — C8499 Mature T/NK-cell lymphomas, unspecified, extranodal and solid organ sites: Secondary | ICD-10-CM | POA: Diagnosis not present

## 2020-11-08 DIAGNOSIS — F17210 Nicotine dependence, cigarettes, uncomplicated: Secondary | ICD-10-CM | POA: Diagnosis not present

## 2020-11-08 LAB — CBC WITH DIFFERENTIAL (CANCER CENTER ONLY)
Abs Immature Granulocytes: 0.03 10*3/uL (ref 0.00–0.07)
Basophils Absolute: 0.1 10*3/uL (ref 0.0–0.1)
Basophils Relative: 1 %
Eosinophils Absolute: 0.1 10*3/uL (ref 0.0–0.5)
Eosinophils Relative: 1 %
HCT: 35.3 % — ABNORMAL LOW (ref 39.0–52.0)
Hemoglobin: 11.7 g/dL — ABNORMAL LOW (ref 13.0–17.0)
Immature Granulocytes: 0 %
Lymphocytes Relative: 9 %
Lymphs Abs: 0.7 10*3/uL (ref 0.7–4.0)
MCH: 30.8 pg (ref 26.0–34.0)
MCHC: 33.1 g/dL (ref 30.0–36.0)
MCV: 92.9 fL (ref 80.0–100.0)
Monocytes Absolute: 0.3 10*3/uL (ref 0.1–1.0)
Monocytes Relative: 5 %
Neutro Abs: 6 10*3/uL (ref 1.7–7.7)
Neutrophils Relative %: 84 %
Platelet Count: 325 10*3/uL (ref 150–400)
RBC: 3.8 MIL/uL — ABNORMAL LOW (ref 4.22–5.81)
RDW: 21.1 % — ABNORMAL HIGH (ref 11.5–15.5)
WBC Count: 7.2 10*3/uL (ref 4.0–10.5)
nRBC: 0 % (ref 0.0–0.2)

## 2020-11-08 LAB — CMP (CANCER CENTER ONLY)
ALT: 15 U/L (ref 0–44)
AST: 17 U/L (ref 15–41)
Albumin: 3.8 g/dL (ref 3.5–5.0)
Alkaline Phosphatase: 141 U/L — ABNORMAL HIGH (ref 38–126)
Anion gap: 10 (ref 5–15)
BUN: 26 mg/dL — ABNORMAL HIGH (ref 8–23)
CO2: 24 mmol/L (ref 22–32)
Calcium: 9.5 mg/dL (ref 8.9–10.3)
Chloride: 107 mmol/L (ref 98–111)
Creatinine: 0.87 mg/dL (ref 0.61–1.24)
GFR, Estimated: >60 mL/min (ref >60)
Glucose, Bld: 176 mg/dL — ABNORMAL HIGH (ref 70–99)
Potassium: 3.8 mmol/L (ref 3.5–5.1)
Sodium: 141 mmol/L (ref 135–145)
Total Bilirubin: 0.3 mg/dL (ref 0.3–1.2)
Total Protein: 6.9 g/dL (ref 6.5–8.1)

## 2020-11-08 LAB — URIC ACID: Uric Acid, Serum: 3.6 mg/dL — ABNORMAL LOW (ref 3.7–8.6)

## 2020-11-08 MED ORDER — SODIUM CHLORIDE 0.9 % IV SOLN
10.0000 mg | Freq: Once | INTRAVENOUS | Status: AC
  Administered 2020-11-08: 10 mg via INTRAVENOUS
  Filled 2020-11-08: qty 10

## 2020-11-08 MED ORDER — ACETAMINOPHEN 325 MG PO TABS
650.0000 mg | ORAL_TABLET | Freq: Once | ORAL | Status: AC
  Administered 2020-11-08: 650 mg via ORAL
  Filled 2020-11-08: qty 2

## 2020-11-08 MED ORDER — SODIUM CHLORIDE 0.9 % IV SOLN
1.8000 mg/kg | Freq: Once | INTRAVENOUS | Status: AC
  Administered 2020-11-08: 170 mg via INTRAVENOUS
  Filled 2020-11-08: qty 34

## 2020-11-08 MED ORDER — DOXORUBICIN HCL CHEMO IV INJECTION 2 MG/ML
50.0000 mg/m2 | Freq: Once | INTRAVENOUS | Status: AC
  Administered 2020-11-08: 108 mg via INTRAVENOUS
  Filled 2020-11-08: qty 54

## 2020-11-08 MED ORDER — SODIUM CHLORIDE 0.9% FLUSH
10.0000 mL | Freq: Once | INTRAVENOUS | Status: AC
  Administered 2020-11-08: 10 mL

## 2020-11-08 MED ORDER — SODIUM CHLORIDE 0.9 % IV SOLN: Freq: Once | INTRAVENOUS | Status: AC

## 2020-11-08 MED ORDER — PALONOSETRON HCL INJECTION 0.25 MG/5ML
0.2500 mg | Freq: Once | INTRAVENOUS | Status: AC
  Administered 2020-11-08: 0.25 mg via INTRAVENOUS
  Filled 2020-11-08: qty 5

## 2020-11-08 MED ORDER — SODIUM CHLORIDE 0.9 % IV SOLN
750.0000 mg/m2 | Freq: Once | INTRAVENOUS | Status: AC
  Administered 2020-11-08: 1620 mg via INTRAVENOUS
  Filled 2020-11-08: qty 81

## 2020-11-08 MED ORDER — DIPHENHYDRAMINE HCL 50 MG/ML IJ SOLN
50.0000 mg | Freq: Once | INTRAMUSCULAR | Status: AC
  Administered 2020-11-08: 50 mg via INTRAVENOUS
  Filled 2020-11-08: qty 1

## 2020-11-08 MED ORDER — SODIUM CHLORIDE 0.9 % IV SOLN
150.0000 mg | Freq: Once | INTRAVENOUS | Status: AC
  Administered 2020-11-08: 150 mg via INTRAVENOUS
  Filled 2020-11-08: qty 150

## 2020-11-08 NOTE — Progress Notes (Signed)
Ok to use echo from 6/19 for treatment today per Dr. Lorenso Courier

## 2020-11-08 NOTE — Progress Notes (Signed)
[place] [phone]   [phone]  PROGRESS NOTE  Patient Care Team: Fanny Bien, MD as PCP - General (Family Medicine)  Hematological/Oncological History # CD30-Positive T-cell Lymphoma, Stage III/IV 07/09/2020: patient underwent cholecystectomy. Liver wedge biopsy during the procedure revealed involvement of a CD30-positive T-cell lymphoproliferative disorder 07/25/2020: establish care with Dr. Lorenso Courier  08/08/2020: PET CT scan showed innumerable hypermetabolic lesions (primarily Deauville 4) in the liver and skeleton. Left neck adenopathy including a dominant level IB lymph node which is Deauville 5. 08/15/2020: Cycle 1 Day 1 of BV-CHP  09/04/2020: Cycle 2 Day 1 of BV-CHP 09/27/2020:  Cycle 3 Day 1 of BV-CHP 10/18/2020: Cycle 4 Day 1 of BV-CHP 11/08/2020: Cycle 5 Day 1 of BV-CHP  Interval History:  Maurice Fox 70 y.o. male with medical history significant for T cell lymphoma (Stage III/IV) presents for a follow up visit. The patient's last visit was on 10/18/2020 for Cycle 4 of BV-CHP.   On exam today [name] is accompanied by his son. He reports he has been well overall in the interim since her last visit.  He notes he did have some lightheadedness and dizziness after receiving his last G-CSF shot.  Reports that he "does not have a lot of energy".  He notes that he rarely has loose stools which predominantly occur in the morning.  He is little bit concerned about some tingling he is having in his right hand as well as changing of the color of the nailbeds.  Patient denies any fevers, chills, shortness of breath, chest pain or constipation. He has no other complaints.  A full 10 point ROS is listed below.  Today we discussed the results of the PET CT scan which show marked response to therapy.  MEDICAL HISTORY:  Past Medical History:  Diagnosis Date   Arthritis    Hyperlipidemia    Hypertension    followed by pcp  (11-23-2019  per pt had  stress test greater than 20 yrs ago, told ok)   Nocturia    OSA on CPAP    per pt uses nightly   Phimosis    Type 2 diabetes mellitus ([place])    followed by pcp   (11-23-2019 per pt does not check blood sugar at home)   Wears glasses     SURGICAL HISTORY: Past Surgical History:  Procedure Laterality Date   APPENDECTOMY  child   CHOLECYSTECTOMY N/A 07/09/2020   Procedure: LAPAROSCOPIC CHOLECYSTECTOMY WITH INTRAOPERATIVE CHOLANGIOGRAM,;  Surgeon: Greer Pickerel, MD;  Location: [facility];  Service: General;  Laterality: N/A;   CIRCUMCISION N/A 11/29/2019   Procedure: CIRCUMCISION ADULT;  Surgeon: [name], MD;  Location: [facility];  Service: Urology;  Laterality: N/A;   IR IMAGING GUIDED PORT INSERTION  08/06/2020   LIVER BIOPSY N/A 07/09/2020   Procedure: LAP LIVER BIOPSY;  Surgeon: Greer Pickerel, MD;  Location: [facility];  Service: General;  Laterality: N/A;   ROTATOR CUFF REPAIR Left chld   SUPRA-UMBILICAL HERNIA N/A 4/49/2010   Procedure: PRIMARY REPAIR SUPRA-UMBILICAL HERNIA;  Surgeon: Greer Pickerel, MD;  Location: [facility];  Service: General;  Laterality: N/A;    SOCIAL HISTORY: Social History   Socioeconomic History   Marital status: Divorced    Spouse name: Not on file   Number of children: Not on file   Years of education: Not on file   Highest education level: Not on file  Occupational History   Not on file  Tobacco Use  Smoking status: Every Day    Packs/day: 1.00    Years: 49.00    Pack years: 49.00    Types: Cigars, Cigarettes   Smokeless tobacco: Never  Vaping Use   Vaping Use: Never used  Substance and Sexual Activity   Alcohol use: Not Currently   Drug use: Never   Sexual activity: Not on file  Other Topics Concern   Not on file  Social History Narrative   Not on file   Social Determinants of Health   Financial Resource Strain: Not on file  Food Insecurity: Not on file  Transportation Needs: Not on file  Physical Activity: Not on file   Stress: Not on file  Social Connections: Not on file  Intimate Partner Violence: Not on file    FAMILY HISTORY: Family History  Problem Relation Age of Onset   Hypotension Mother    Diabetes Mellitus II Father    Prostate cancer Brother    Prostate cancer Brother     ALLERGIES:  has No Known Allergies.  MEDICATIONS:  Current Outpatient Medications  Medication Sig Dispense Refill   allopurinol (ZYLOPRIM) 300 MG tablet Take 1 tablet (300 mg total) by mouth daily. 90 tablet 1   amLODipine (NORVASC) 5 MG tablet 1 tablet     aspirin (BAYER ASPIRIN EC LOW DOSE) 81 MG EC tablet 1 tablet     atorvastatin (LIPITOR) 10 MG tablet Take 1 tablet by mouth daily.     azelastine (ASTELIN) 0.1 % nasal spray 1 puff in each nostril     diclofenac Sodium (VOLTAREN) 1 % GEL Apply 2 g topically 4 (four) times daily as needed for pain.     Empagliflozin-metFORMIN HCl ER 25-1000 MG TB24 Take 1 tablet by mouth at bedtime.     fluticasone (FLONASE) 50 MCG/ACT nasal spray Place 2 sprays into both nostrils in the morning and at bedtime.     lidocaine-prilocaine (EMLA) cream Apply 1 application topically as needed. 30 g 0   methocarbamol (ROBAXIN) 500 MG tablet Take 1 tablet (500 mg total) by mouth every 8 (eight) hours as needed for muscle spasms. 30 tablet 0   Multiple Vitamins-Minerals (CENTRUM SILVER PO) Take 1 tablet by mouth at bedtime.     ondansetron (ZOFRAN) 8 MG tablet Take 1 tablet (8 mg total) by mouth every 8 (eight) hours as needed for nausea or vomiting. 30 tablet 0   predniSONE (DELTASONE) 20 MG tablet Take 3 tablets (60 mg total) by mouth daily with breakfast. 15 tablet 5   prochlorperazine (COMPAZINE) 10 MG tablet Take 1 tablet (10 mg total) by mouth every 6 (six) hours as needed for nausea or vomiting. 30 tablet 0   Propylene Glycol (SYSTANE BALANCE OP) Place 1 drop into both eyes 2 (two) times daily.     sildenafil (VIAGRA) 100 MG tablet Take 100 mg by mouth daily as needed (ED).      tamsulosin (FLOMAX) 0.4 MG CAPS capsule Take 0.4 mg by mouth at bedtime.     No current facility-administered medications for this visit.   Facility-Administered Medications Ordered in Other Visits  Medication Dose Route Frequency Provider Last Rate Last Admin   brentuximab vedotin (ADCETRIS) 170 mg in sodium chloride 0.9 % 100 mL chemo infusion  1.8 mg/kg (Treatment Plan Recorded) Intravenous Once [name], MD       cyclophosphamide (CYTOXAN) 1,620 mg in sodium chloride 0.9 % 250 mL chemo infusion  750 mg/m2 (Treatment Plan Recorded) Intravenous Once Narda Rutherford  T IV, MD       DOXOrubicin (ADRIAMYCIN) chemo injection 108 mg  50 mg/m2 (Treatment Plan Recorded) Intravenous Once [name], MD        REVIEW OF SYSTEMS:   Constitutional: ( - ) fevers, ( - )  chills , ( - ) night sweats Eyes: ( - ) blurriness of vision, ( - ) double vision, ( - ) watery eyes Ears, nose, mouth, throat, and face: ( - ) mucositis, ( - ) sore throat Respiratory: ( - ) cough, ( - ) dyspnea, ( - ) wheezes Cardiovascular: ( - ) palpitation, ( - ) chest discomfort, ( - ) lower extremity swelling Gastrointestinal:  ( - ) nausea, ( - ) heartburn, ( - ) change in bowel habits Skin: ( - ) abnormal skin rashes Lymphatics: ( - ) new lymphadenopathy, ( - ) easy bruising Neurological: ( - ) numbness, ( - ) tingling, ( - ) new weaknesses Behavioral/Psych: ( - ) mood change, ( - ) new changes  All other systems were reviewed with the patient and are negative.  PHYSICAL EXAMINATION: ECOG PERFORMANCE STATUS: 1 - Symptomatic but completely ambulatory  Vitals:   11/08/20 1011  BP: (!) 150/70  Pulse: (!) 109  Resp: 18  Temp: 98 F (36.7 C)  SpO2: 97%     Filed Weights   11/08/20 1011  Weight: 212 lb 6.4 oz (96.3 kg)    GENERAL: Well-appearing elderly African-American male, alert, no distress and comfortable SKIN: skin color, texture, turgor are normal, no rashes or significant lesions EYES:  conjunctiva are pink and non-injected, sclera clear NECK: supple, non-tender LYMPH: Improving palpable left submandibular lymph node, otherwise no palpable lymphadenopathy in the cervical, axillary or inguinal LUNGS: clear to auscultation and percussion with normal breathing effort HEART: regular rate & rhythm and no murmurs and no lower extremity edema PSYCH: alert & oriented x 3, fluent speech NEURO: no focal motor/sensory deficits  LABORATORY DATA:  I have reviewed the data as listed CBC Latest Ref Rng & Units 11/08/2020 10/18/2020 09/27/2020  WBC 4.0 - 10.5 K/uL 7.2 8.1 12.7(H)  Hemoglobin 13.0 - 17.0 g/dL 11.7(L) 11.7(L) 10.8(L)  Hematocrit 39.0 - 52.0 % 35.3(L) 36.1(L) 33.0(L)  Platelets 150 - 400 K/uL 325 330 444(H)    CMP Latest Ref Rng & Units 11/08/2020 10/18/2020 09/27/2020  Glucose 70 - 99 mg/dL 176(H) 194(H) 241(H)  BUN 8 - 23 mg/dL 26(H) 25(H) 26(H)  Creatinine 0.61 - 1.24 mg/dL 0.87 0.91 0.81  Sodium 135 - 145 mmol/L 141 141 139  Potassium 3.5 - 5.1 mmol/L 3.8 3.5 4.0  Chloride 98 - 111 mmol/L 107 106 107  CO2 22 - 32 mmol/L 24 22 22   Calcium 8.9 - 10.3 mg/dL 9.5 9.5 9.5  Total Protein 6.5 - 8.1 g/dL 6.9 7.1 7.0  Total Bilirubin 0.3 - 1.2 mg/dL 0.3 0.6 0.4  Alkaline Phos 38 - 126 U/L 141(H) 165(H) 265(H)  AST 15 - 41 U/L 17 21 16   ALT 0 - 44 U/L 15 18 20    RADIOGRAPHIC STUDIES: No images were reviewed during this visit.   ASSESSMENT & PLAN Maurice Fox 70 y.o. male with medical history significant for T cell lymphoma (Stage III/IV) presents for a follow up visit.  After review of the labs, review of the records, and discussion with the patient the patients findings are most consistent with a peripheral CD30 positive T-cell lymphoma.  More specifically this appears like an ALK negative anaplastic large  cell lymphoma.  The patient has been staged with a PET CT scan which showed clear involvement of the skeleton and liver, most consistent with stage IV disease.  Given  these findings we will plan for 6 cycles of BV CHP chemotherapy.  The regimen of BV CHP consists of brentuximab 1.8 mg/kg IV on day 1, cyclophosphamide 750 mg per metered squared on day 1, doxorubicin 50 mg per metered squared on day 1, and prednisone 60 mg p.o. day 1 through 5.  This is to be continued every 21 days with a plan for up to 6 cycles.  After the third or fourth cycle we will consider restaging PET CT scan in order to evaluate for response.  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 --TTE performed 07/08/2020, shows EF of 50-55% --negative HIV, Hep B and C serologies. --GCSF support on Day 3 of each cycle.  -LDH, Liver enzymes and Alk Phos are improving. Patient will proceed with treatment today.  --PET CT scan performed on 11/05/2020, findings show Deauville 2 disease with some Deauville 4 noted in the skeleton. Plan for post treatment PET after the last cycle.  --RTC in 3 weeks for Cycle 6 Day 1 of treatment.   #Chemotherapy Induced Anemia --Hgb 11.7, stable --continue to monitor   #Supportive Care -- chemotherapy education complete -- port placed .  -- zofran 64m q8H PRN and compazine 166mPO q6H for nausea -- allopurinol 30031mO daily for TLS prophylaxis -- EMLA cream for port -- no pain medication required at this time.   No orders of the defined types were placed in this encounter.   All questions were answered. The patient knows to call the clinic with any problems, questions or concerns.  A total of more than 30 minutes were spent on this encounter with face-to-face time and non-face-to-face time, including preparing to see the patient, ordering medications, counseling the patient and coordination of care as outlined above.   JohLedell PeoplesD Department of Hematology/Oncology [place] [facility]: [phone]: [phone]: [email or web address]  11/08/2020 12:40 PM

## 2020-11-10 ENCOUNTER — Inpatient Hospital Stay: Payer: Medicare HMO

## 2020-11-10 ENCOUNTER — Other Ambulatory Visit: Payer: Self-pay

## 2020-11-10 VITALS — BP 152/79 | HR 99 | Temp 98.7°F | Resp 20

## 2020-11-10 DIAGNOSIS — T451X5A Adverse effect of antineoplastic and immunosuppressive drugs, initial encounter: Secondary | ICD-10-CM | POA: Diagnosis not present

## 2020-11-10 DIAGNOSIS — Z7982 Long term (current) use of aspirin: Secondary | ICD-10-CM | POA: Diagnosis not present

## 2020-11-10 DIAGNOSIS — Z79899 Other long term (current) drug therapy: Secondary | ICD-10-CM | POA: Diagnosis not present

## 2020-11-10 DIAGNOSIS — C8499 Mature T/NK-cell lymphomas, unspecified, extranodal and solid organ sites: Secondary | ICD-10-CM | POA: Diagnosis not present

## 2020-11-10 DIAGNOSIS — Z5111 Encounter for antineoplastic chemotherapy: Secondary | ICD-10-CM | POA: Diagnosis not present

## 2020-11-10 DIAGNOSIS — F17210 Nicotine dependence, cigarettes, uncomplicated: Secondary | ICD-10-CM | POA: Diagnosis not present

## 2020-11-10 DIAGNOSIS — C8442 Peripheral T-cell lymphoma, not classified, intrathoracic lymph nodes: Secondary | ICD-10-CM

## 2020-11-10 DIAGNOSIS — Z1589 Genetic susceptibility to other disease: Secondary | ICD-10-CM | POA: Diagnosis not present

## 2020-11-10 DIAGNOSIS — Z5112 Encounter for antineoplastic immunotherapy: Secondary | ICD-10-CM | POA: Diagnosis not present

## 2020-11-10 DIAGNOSIS — D6481 Anemia due to antineoplastic chemotherapy: Secondary | ICD-10-CM | POA: Diagnosis not present

## 2020-11-10 MED ORDER — PEGFILGRASTIM-CBQV 6 MG/0.6ML ~~LOC~~ SOSY
6.0000 mg | PREFILLED_SYRINGE | Freq: Once | SUBCUTANEOUS | Status: AC
  Administered 2020-11-10: 6 mg via SUBCUTANEOUS

## 2020-11-10 NOTE — Patient Instructions (Signed)

## 2020-11-22 DIAGNOSIS — I10 Essential (primary) hypertension: Secondary | ICD-10-CM | POA: Diagnosis not present

## 2020-11-22 DIAGNOSIS — E1165 Type 2 diabetes mellitus with hyperglycemia: Secondary | ICD-10-CM | POA: Diagnosis not present

## 2020-11-27 DIAGNOSIS — E1165 Type 2 diabetes mellitus with hyperglycemia: Secondary | ICD-10-CM | POA: Diagnosis not present

## 2020-11-27 DIAGNOSIS — E782 Mixed hyperlipidemia: Secondary | ICD-10-CM | POA: Diagnosis not present

## 2020-11-27 DIAGNOSIS — I10 Essential (primary) hypertension: Secondary | ICD-10-CM | POA: Diagnosis not present

## 2020-11-27 DIAGNOSIS — R42 Dizziness and giddiness: Secondary | ICD-10-CM | POA: Diagnosis not present

## 2020-11-27 DIAGNOSIS — C8590 Non-Hodgkin lymphoma, unspecified, unspecified site: Secondary | ICD-10-CM | POA: Diagnosis not present

## 2020-11-29 ENCOUNTER — Inpatient Hospital Stay: Payer: Medicare HMO

## 2020-11-29 ENCOUNTER — Inpatient Hospital Stay (HOSPITAL_BASED_OUTPATIENT_CLINIC_OR_DEPARTMENT_OTHER): Payer: Medicare HMO | Admitting: Hematology and Oncology

## 2020-11-29 ENCOUNTER — Inpatient Hospital Stay: Payer: Medicare HMO | Attending: Hematology and Oncology

## 2020-11-29 ENCOUNTER — Other Ambulatory Visit: Payer: Self-pay

## 2020-11-29 VITALS — HR 97

## 2020-11-29 VITALS — BP 150/82 | HR 103 | Temp 97.4°F | Resp 18 | Wt 208.4 lb

## 2020-11-29 DIAGNOSIS — C8442 Peripheral T-cell lymphoma, not classified, intrathoracic lymph nodes: Secondary | ICD-10-CM

## 2020-11-29 DIAGNOSIS — Z7984 Long term (current) use of oral hypoglycemic drugs: Secondary | ICD-10-CM | POA: Insufficient documentation

## 2020-11-29 DIAGNOSIS — Z5111 Encounter for antineoplastic chemotherapy: Secondary | ICD-10-CM | POA: Diagnosis not present

## 2020-11-29 DIAGNOSIS — Z5112 Encounter for antineoplastic immunotherapy: Secondary | ICD-10-CM

## 2020-11-29 DIAGNOSIS — Z5189 Encounter for other specified aftercare: Secondary | ICD-10-CM | POA: Diagnosis not present

## 2020-11-29 DIAGNOSIS — Z95828 Presence of other vascular implants and grafts: Secondary | ICD-10-CM

## 2020-11-29 DIAGNOSIS — Z7982 Long term (current) use of aspirin: Secondary | ICD-10-CM | POA: Insufficient documentation

## 2020-11-29 DIAGNOSIS — E119 Type 2 diabetes mellitus without complications: Secondary | ICD-10-CM | POA: Insufficient documentation

## 2020-11-29 DIAGNOSIS — D6481 Anemia due to antineoplastic chemotherapy: Secondary | ICD-10-CM | POA: Insufficient documentation

## 2020-11-29 DIAGNOSIS — T451X5A Adverse effect of antineoplastic and immunosuppressive drugs, initial encounter: Secondary | ICD-10-CM | POA: Insufficient documentation

## 2020-11-29 DIAGNOSIS — C8499 Mature T/NK-cell lymphomas, unspecified, extranodal and solid organ sites: Secondary | ICD-10-CM | POA: Diagnosis not present

## 2020-11-29 DIAGNOSIS — Z79899 Other long term (current) drug therapy: Secondary | ICD-10-CM | POA: Diagnosis not present

## 2020-11-29 DIAGNOSIS — I10 Essential (primary) hypertension: Secondary | ICD-10-CM | POA: Diagnosis not present

## 2020-11-29 DIAGNOSIS — F17210 Nicotine dependence, cigarettes, uncomplicated: Secondary | ICD-10-CM | POA: Diagnosis not present

## 2020-11-29 LAB — CMP (CANCER CENTER ONLY)
ALT: 16 U/L (ref 0–44)
AST: 19 U/L (ref 15–41)
Albumin: 3.6 g/dL (ref 3.5–5.0)
Alkaline Phosphatase: 140 U/L — ABNORMAL HIGH (ref 38–126)
Anion gap: 9 (ref 5–15)
BUN: 23 mg/dL (ref 8–23)
CO2: 24 mmol/L (ref 22–32)
Calcium: 9.1 mg/dL (ref 8.9–10.3)
Chloride: 108 mmol/L (ref 98–111)
Creatinine: 0.79 mg/dL (ref 0.61–1.24)
GFR, Estimated: >60 mL/min (ref >60)
Glucose, Bld: 141 mg/dL — ABNORMAL HIGH (ref 70–99)
Potassium: 4.2 mmol/L (ref 3.5–5.1)
Sodium: 141 mmol/L (ref 135–145)
Total Bilirubin: 0.4 mg/dL (ref 0.3–1.2)
Total Protein: 6.9 g/dL (ref 6.5–8.1)

## 2020-11-29 LAB — CBC WITH DIFFERENTIAL (CANCER CENTER ONLY)
Abs Immature Granulocytes: 0.03 10*3/uL (ref 0.00–0.07)
Basophils Absolute: 0 10*3/uL (ref 0.0–0.1)
Basophils Relative: 1 %
Eosinophils Absolute: 0 10*3/uL (ref 0.0–0.5)
Eosinophils Relative: 0 %
HCT: 35.3 % — ABNORMAL LOW (ref 39.0–52.0)
Hemoglobin: 11.6 g/dL — ABNORMAL LOW (ref 13.0–17.0)
Immature Granulocytes: 0 %
Lymphocytes Relative: 8 %
Lymphs Abs: 0.6 10*3/uL — ABNORMAL LOW (ref 0.7–4.0)
MCH: 30.9 pg (ref 26.0–34.0)
MCHC: 32.9 g/dL (ref 30.0–36.0)
MCV: 94.1 fL (ref 80.0–100.0)
Monocytes Absolute: 0.2 10*3/uL (ref 0.1–1.0)
Monocytes Relative: 3 %
Neutro Abs: 6.9 10*3/uL (ref 1.7–7.7)
Neutrophils Relative %: 88 %
Platelet Count: 267 10*3/uL (ref 150–400)
RBC: 3.75 MIL/uL — ABNORMAL LOW (ref 4.22–5.81)
RDW: 19 % — ABNORMAL HIGH (ref 11.5–15.5)
WBC Count: 7.8 10*3/uL (ref 4.0–10.5)
nRBC: 0 % (ref 0.0–0.2)

## 2020-11-29 LAB — LACTATE DEHYDROGENASE: LDH: 218 U/L — ABNORMAL HIGH (ref 98–192)

## 2020-11-29 LAB — URIC ACID: Uric Acid, Serum: 3.5 mg/dL — ABNORMAL LOW (ref 3.7–8.6)

## 2020-11-29 MED ORDER — SODIUM CHLORIDE 0.9 % IV SOLN
750.0000 mg/m2 | Freq: Once | INTRAVENOUS | Status: AC
  Administered 2020-11-29: 1620 mg via INTRAVENOUS
  Filled 2020-11-29: qty 81

## 2020-11-29 MED ORDER — ACETAMINOPHEN 325 MG PO TABS
650.0000 mg | ORAL_TABLET | Freq: Once | ORAL | Status: AC
  Administered 2020-11-29: 650 mg via ORAL
  Filled 2020-11-29: qty 2

## 2020-11-29 MED ORDER — SODIUM CHLORIDE 0.9 % IV SOLN
150.0000 mg | Freq: Once | INTRAVENOUS | Status: AC
  Administered 2020-11-29: 150 mg via INTRAVENOUS
  Filled 2020-11-29: qty 150

## 2020-11-29 MED ORDER — SODIUM CHLORIDE 0.9 % IV SOLN
10.0000 mg | Freq: Once | INTRAVENOUS | Status: AC
  Administered 2020-11-29: 10 mg via INTRAVENOUS
  Filled 2020-11-29: qty 10

## 2020-11-29 MED ORDER — SODIUM CHLORIDE 0.9% FLUSH
10.0000 mL | INTRAVENOUS | Status: DC | PRN
  Administered 2020-11-29: 10 mL

## 2020-11-29 MED ORDER — SODIUM CHLORIDE 0.9 % IV SOLN
1.8000 mg/kg | Freq: Once | INTRAVENOUS | Status: AC
  Administered 2020-11-29: 170 mg via INTRAVENOUS
  Filled 2020-11-29: qty 34

## 2020-11-29 MED ORDER — PALONOSETRON HCL INJECTION 0.25 MG/5ML
0.2500 mg | Freq: Once | INTRAVENOUS | Status: AC
  Administered 2020-11-29: 0.25 mg via INTRAVENOUS
  Filled 2020-11-29: qty 5

## 2020-11-29 MED ORDER — SODIUM CHLORIDE 0.9 % IV SOLN: Freq: Once | INTRAVENOUS | Status: AC

## 2020-11-29 MED ORDER — SODIUM CHLORIDE 0.9% FLUSH
10.0000 mL | Freq: Once | INTRAVENOUS | Status: AC
  Administered 2020-11-29: 10 mL

## 2020-11-29 MED ORDER — DOXORUBICIN HCL CHEMO IV INJECTION 2 MG/ML
50.0000 mg/m2 | Freq: Once | INTRAVENOUS | Status: AC
  Administered 2020-11-29: 108 mg via INTRAVENOUS
  Filled 2020-11-29: qty 54

## 2020-11-29 MED ORDER — DIPHENHYDRAMINE HCL 50 MG/ML IJ SOLN
50.0000 mg | Freq: Once | INTRAMUSCULAR | Status: AC
  Administered 2020-11-29: 50 mg via INTRAVENOUS
  Filled 2020-11-29: qty 1

## 2020-11-29 MED ORDER — HEPARIN SOD (PORK) LOCK FLUSH 100 UNIT/ML IV SOLN
500.0000 [IU] | Freq: Once | INTRAVENOUS | Status: AC | PRN
  Administered 2020-11-29: 500 [IU]

## 2020-11-29 NOTE — Patient Instructions (Signed)
[facility]  Discharge Instructions: Thank you for choosing [facility] to provide your oncology and hematology care.   If you have a lab appointment with the [facility], please go directly to the [facility] and check in at the registration area.   Wear comfortable clothing and clothing appropriate for easy access to any Portacath or PICC line.   We strive to give you quality time with your provider. You may need to reschedule your appointment if you arrive late (15 or more minutes).  Arriving late affects you and other patients whose appointments are after yours.  Also, if you miss three or more appointments without notifying the office, you may be dismissed from the clinic at the provider's discretion.      For prescription refill requests, have your pharmacy contact our office and allow 72 hours for refills to be completed.    Today you received the following chemotherapy and/or immunotherapy agents Adriamycin/Cytoxan/Brentuximab      To help prevent nausea and vomiting after your treatment, we encourage you to take your nausea medication as directed.  BELOW ARE SYMPTOMS THAT SHOULD BE REPORTED IMMEDIATELY: *FEVER GREATER THAN 100.4 F (38 C) OR HIGHER *CHILLS OR SWEATING *NAUSEA AND VOMITING THAT IS NOT CONTROLLED WITH YOUR NAUSEA MEDICATION *UNUSUAL SHORTNESS OF BREATH *UNUSUAL BRUISING OR BLEEDING *URINARY PROBLEMS (pain or burning when urinating, or frequent urination) *BOWEL PROBLEMS (unusual diarrhea, constipation, pain near the anus) TENDERNESS IN MOUTH AND THROAT WITH OR WITHOUT PRESENCE OF ULCERS (sore throat, sores in mouth, or a toothache) UNUSUAL RASH, SWELLING OR PAIN  UNUSUAL VAGINAL DISCHARGE OR ITCHING   Items with * indicate a potential emergency and should be followed up as soon as possible or go to the Emergency Department if any problems should occur.  Please show the CHEMOTHERAPY ALERT CARD or IMMUNOTHERAPY ALERT  CARD at check-in to the Emergency Department and triage nurse.  Should you have questions after your visit or need to cancel or reschedule your appointment, please contact [facility]  Dept: [phone]  and follow the prompts.  Office hours are 8:00 a.m. to 4:30 p.m. Monday - Friday. Please note that voicemails left after 4:00 p.m. may not be returned until the following business day.  We are closed weekends and major holidays. You have access to a nurse at all times for urgent questions. Please call the main number to the clinic Dept: [phone] and follow the prompts.   For any non-urgent questions, you may also contact your provider using MyChart. We now offer e-Visits for anyone 28 and older to request care online for non-urgent symptoms. For details visit [email or web address].   Also download the MyChart app! Go to the app store, search "MyChart", open the app, select [facility], and log in with your MyChart username and password.  Due to Covid, a mask is required upon entering the hospital/clinic. If you do not have a mask, one will be given to you upon arrival. For doctor visits, patients may have 1 support person aged 68 or older with them. For treatment visits, patients cannot have anyone with them due to current Covid guidelines and our immunocompromised population.

## 2020-11-29 NOTE — Progress Notes (Signed)
[facility] [phone]   Fax:([phone]  PROGRESS NOTE  Patient Care Team: Fanny Bien, MD as PCP - General (Family Medicine)  Hematological/Oncological History # CD30-Positive T-cell Lymphoma, Stage III/IV 07/09/2020: patient underwent cholecystectomy. Liver wedge biopsy during the procedure revealed involvement of a CD30-positive T-cell lymphoproliferative disorder 07/25/2020: establish care with Dr. Lorenso Courier  08/08/2020: PET CT scan showed innumerable hypermetabolic lesions (primarily Deauville 4) in the liver and skeleton. Left neck adenopathy including a dominant level IB lymph node which is Deauville 5. 08/15/2020: Cycle 1 Day 1 of BV-CHP  09/04/2020: Cycle 2 Day 1 of BV-CHP 09/27/2020:  Cycle 3 Day 1 of BV-CHP 10/18/2020: Cycle 4 Day 1 of BV-CHP 11/08/2020: Cycle 5 Day 1 of BV-CHP 11/29/2020: Cycle 6 Day 1 of BV-CHP  Interval History:  Maurice Fox 70 y.o. male with medical history significant for T cell lymphoma (Stage III/IV) presents for a follow up visit. The patient's last visit was on 11/08/2020 for Cycle 5 of BV-CHP.   On exam today [name] is accompanied by his son. He reports he has been well in the interim since our last visit.  He does note interestingly that he developed hiccups after his last 2 cycles of chemotherapy which lasted for a full day after treatment.  He reports that he does tend to have diarrhea on Monday after treatment but for the most part does not have any bowel difficulties.  He notes his appetite is good and his energy levels are strong.  He not noticed any new lymphadenopathy or had any signs or symptoms of recurrence.  Patient denies any fevers, chills, shortness of breath, chest pain or constipation. He has no other complaints.  A full 10 point ROS is listed below.   MEDICAL HISTORY:  Past Medical History:  Diagnosis Date   Arthritis    Hyperlipidemia    Hypertension    followed by pcp  (11-23-2019  per pt  had stress test greater than 20 yrs ago, told ok)   Nocturia    OSA on CPAP    per pt uses nightly   Phimosis    Type 2 diabetes mellitus ([place])    followed by pcp   (11-23-2019 per pt does not check blood sugar at home)   Wears glasses     SURGICAL HISTORY: Past Surgical History:  Procedure Laterality Date   APPENDECTOMY  child   CHOLECYSTECTOMY N/A 07/09/2020   Procedure: LAPAROSCOPIC CHOLECYSTECTOMY WITH INTRAOPERATIVE CHOLANGIOGRAM,;  Surgeon: Greer Pickerel, MD;  Location: [facility];  Service: General;  Laterality: N/A;   CIRCUMCISION N/A 11/29/2019   Procedure: CIRCUMCISION ADULT;  Surgeon: [name], MD;  Location: [facility];  Service: Urology;  Laterality: N/A;   IR IMAGING GUIDED PORT INSERTION  08/06/2020   LIVER BIOPSY N/A 07/09/2020   Procedure: LAP LIVER BIOPSY;  Surgeon: Greer Pickerel, MD;  Location: [facility];  Service: General;  Laterality: N/A;   ROTATOR CUFF REPAIR Left chld   SUPRA-UMBILICAL HERNIA N/A 07/17/3660   Procedure: PRIMARY REPAIR SUPRA-UMBILICAL HERNIA;  Surgeon: Greer Pickerel, MD;  Location: [facility];  Service: General;  Laterality: N/A;    SOCIAL HISTORY: Social History   Socioeconomic History   Marital status: Divorced    Spouse name: Not on file   Number of children: Not on file   Years of education: Not on file   Highest education level: Not on file  Occupational History   Not on file  Tobacco Use  Smoking status: Every Day    Packs/day: 1.00    Years: 49.00    Pack years: 49.00    Types: Cigars, Cigarettes   Smokeless tobacco: Never  Vaping Use   Vaping Use: Never used  Substance and Sexual Activity   Alcohol use: Not Currently   Drug use: Never   Sexual activity: Not on file  Other Topics Concern   Not on file  Social History Narrative   Not on file   Social Determinants of Health   Financial Resource Strain: Not on file  Food Insecurity: Not on file  Transportation Needs: Not on file  Physical Activity: Not on  file  Stress: Not on file  Social Connections: Not on file  Intimate Partner Violence: Not on file    FAMILY HISTORY: Family History  Problem Relation Age of Onset   Hypotension Mother    Diabetes Mellitus II Father    Prostate cancer Brother    Prostate cancer Brother     ALLERGIES:  has No Known Allergies.  MEDICATIONS:  Current Outpatient Medications  Medication Sig Dispense Refill   allopurinol (ZYLOPRIM) 300 MG tablet Take 1 tablet (300 mg total) by mouth daily. 90 tablet 1   amLODipine (NORVASC) 5 MG tablet 1 tablet     aspirin (BAYER ASPIRIN EC LOW DOSE) 81 MG EC tablet 1 tablet     atorvastatin (LIPITOR) 10 MG tablet Take 1 tablet by mouth daily.     azelastine (ASTELIN) 0.1 % nasal spray 1 puff in each nostril     diclofenac Sodium (VOLTAREN) 1 % GEL Apply 2 g topically 4 (four) times daily as needed for pain.     Empagliflozin-metFORMIN HCl ER 25-1000 MG TB24 Take 1 tablet by mouth at bedtime.     fluticasone (FLONASE) 50 MCG/ACT nasal spray Place 2 sprays into both nostrils in the morning and at bedtime.     lidocaine-prilocaine (EMLA) cream Apply 1 application topically as needed. 30 g 0   methocarbamol (ROBAXIN) 500 MG tablet Take 1 tablet (500 mg total) by mouth every 8 (eight) hours as needed for muscle spasms. 30 tablet 0   Multiple Vitamins-Minerals (CENTRUM SILVER PO) Take 1 tablet by mouth at bedtime.     ondansetron (ZOFRAN) 8 MG tablet Take 1 tablet (8 mg total) by mouth every 8 (eight) hours as needed for nausea or vomiting. 30 tablet 0   predniSONE (DELTASONE) 20 MG tablet Take 3 tablets (60 mg total) by mouth daily with breakfast. 15 tablet 5   prochlorperazine (COMPAZINE) 10 MG tablet Take 1 tablet (10 mg total) by mouth every 6 (six) hours as needed for nausea or vomiting. 30 tablet 0   Propylene Glycol (SYSTANE BALANCE OP) Place 1 drop into both eyes 2 (two) times daily.     sildenafil (VIAGRA) 100 MG tablet Take 100 mg by mouth daily as needed (ED).      tamsulosin (FLOMAX) 0.4 MG CAPS capsule Take 0.4 mg by mouth at bedtime.     No current facility-administered medications for this visit.    REVIEW OF SYSTEMS:   Constitutional: ( - ) fevers, ( - )  chills , ( - ) night sweats Eyes: ( - ) blurriness of vision, ( - ) double vision, ( - ) watery eyes Ears, nose, mouth, throat, and face: ( - ) mucositis, ( - ) sore throat Respiratory: ( - ) cough, ( - ) dyspnea, ( - ) wheezes Cardiovascular: ( - ) palpitation, ( - )  chest discomfort, ( - ) lower extremity swelling Gastrointestinal:  ( - ) nausea, ( - ) heartburn, ( - ) change in bowel habits Skin: ( - ) abnormal skin rashes Lymphatics: ( - ) new lymphadenopathy, ( - ) easy bruising Neurological: ( - ) numbness, ( - ) tingling, ( - ) new weaknesses Behavioral/Psych: ( - ) mood change, ( - ) new changes  All other systems were reviewed with the patient and are negative.  PHYSICAL EXAMINATION: ECOG PERFORMANCE STATUS: 1 - Symptomatic but completely ambulatory  Vitals:   11/29/20 1026  BP: (!) 150/82  Pulse: (!) 103  Resp: 18  Temp: (!) 97.4 F (36.3 C)  SpO2: 96%      Filed Weights   11/29/20 1026  Weight: 208 lb 6.4 oz (94.5 kg)    GENERAL: Well-appearing elderly African-American male, alert, no distress and comfortable SKIN: skin color, texture, turgor are normal, no rashes or significant lesions EYES: conjunctiva are pink and non-injected, sclera clear NECK: supple, non-tender LYMPH: resolved left submandibular lymph node, otherwise no palpable lymphadenopathy in the cervical, axillary or inguinal LUNGS: clear to auscultation and percussion with normal breathing effort HEART: regular rate & rhythm and no murmurs and no lower extremity edema PSYCH: alert & oriented x 3, fluent speech NEURO: no focal motor/sensory deficits  LABORATORY DATA:  I have reviewed the data as listed CBC Latest Ref Rng & Units 11/29/2020 11/08/2020 10/18/2020  WBC 4.0 - 10.5 K/uL 7.8 7.2 8.1   Hemoglobin 13.0 - 17.0 g/dL 11.6(L) 11.7(L) 11.7(L)  Hematocrit 39.0 - 52.0 % 35.3(L) 35.3(L) 36.1(L)  Platelets 150 - 400 K/uL 267 325 330    CMP Latest Ref Rng & Units 11/29/2020 11/08/2020 10/18/2020  Glucose 70 - 99 mg/dL 141(H) 176(H) 194(H)  BUN 8 - 23 mg/dL 23 26(H) 25(H)  Creatinine 0.61 - 1.24 mg/dL 0.79 0.87 0.91  Sodium 135 - 145 mmol/L 141 141 141  Potassium 3.5 - 5.1 mmol/L 4.2 3.8 3.5  Chloride 98 - 111 mmol/L 108 107 106  CO2 22 - 32 mmol/L _0 Calcium 8.9 - 10.3 mg/dL 9.1 9.5 9.5  Total Protein 6.5 - 8.1 g/dL 6.9 6.9 7.1  Total Bilirubin 0.3 - 1.2 mg/dL 0.4 0.3 0.6  Alkaline Phos 38 - 126 U/L 140(H) 141(H) 165(H)  AST 15 - 41 U/L _1 ALT 0 - 44 U/L _2 RADIOGRAPHIC STUDIES: No images were reviewed during this visit.   ASSESSMENT & PLAN DEVONTAY CELAYA 70 y.o. male with medical history significant for T cell lymphoma (Stage III/IV) presents for a follow up visit.  After review of the labs, review of the records, and discussion with the patient the patients findings are most consistent with a peripheral CD30 positive T-cell lymphoma.  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  The patient has been staged with a PET CT scan which showed clear involvement of the skeleton and liver, most consistent with stage IV disease.  Given these findings we will plan for 6 cycles of BV CHP chemotherapy.  The regimen of BV CHP consists of brentuximab 1.8 mg/kg IV on day 1, cyclophosphamide 750 mg per metered squared on day 1, doxorubicin 50 mg per metered squared on day 1, and prednisone 60 mg p.o. day 1 through 5.  This is to be continued every 21 days with a plan for up to 6 cycles.  After the third or fourth cycle  we will consider restaging PET CT scan in order to evaluate for response.  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 --TTE performed 07/08/2020, shows EF of 50-55% --negative HIV, Hep B and C serologies. --GCSF support on Day 3 of each cycle.  -LDH, Liver enzymes and Alk Phos are improving. Patient will proceed with treatment today.  --PET CT scan performed on 11/05/2020, findings show Deauville 2 disease with some Deauville 4 noted in the skeleton. Plan for post treatment PET after the last cycle.  --RTC in 4 weeks with post treatment PET CT  #Chemotherapy Induced Anemia --Hgb 11.6, stable --continue to monitor   #Supportive Care -- chemotherapy education complete -- port placed .  -- zofran 20m q8H PRN and compazine 147mPO q6H for nausea -- allopurinol 30048mO daily for TLS prophylaxis -- EMLA cream for port -- no pain medication required at this time.   Orders Placed This Encounter  Procedures   NM PET Image Restag (PS) Skull Base To Thigh    Standing Status:   Future    Standing Expiration Date:   11/29/2021    Order Specific Question:   If indicated for the ordered procedure, I authorize the administration of a radiopharmaceutical per Radiology protocol    Answer:   Yes    Order Specific Question:   Preferred imaging location?    Answer:   [facility]  All questions were answered. The patient knows to call the clinic with any problems, questions or concerns.  A total of more than 30 minutes were spent on this encounter with face-to-face time and non-face-to-face time, including preparing to see the patient, ordering medications, counseling the patient and coordination of care as outlined above.   JohLedell PeoplesD Department of Hematology/Oncology [facility] Campusone: [phone]: [phone]: [email or web address]  11/29/2020 10:59 AM

## 2020-12-01 ENCOUNTER — Inpatient Hospital Stay: Payer: Medicare HMO

## 2020-12-01 ENCOUNTER — Other Ambulatory Visit: Payer: Self-pay

## 2020-12-01 VITALS — BP 147/73 | HR 88 | Temp 98.8°F | Resp 17

## 2020-12-01 DIAGNOSIS — Z5112 Encounter for antineoplastic immunotherapy: Secondary | ICD-10-CM | POA: Diagnosis not present

## 2020-12-01 DIAGNOSIS — T451X5A Adverse effect of antineoplastic and immunosuppressive drugs, initial encounter: Secondary | ICD-10-CM | POA: Diagnosis not present

## 2020-12-01 DIAGNOSIS — E119 Type 2 diabetes mellitus without complications: Secondary | ICD-10-CM | POA: Diagnosis not present

## 2020-12-01 DIAGNOSIS — D6481 Anemia due to antineoplastic chemotherapy: Secondary | ICD-10-CM | POA: Diagnosis not present

## 2020-12-01 DIAGNOSIS — C8442 Peripheral T-cell lymphoma, not classified, intrathoracic lymph nodes: Secondary | ICD-10-CM

## 2020-12-01 DIAGNOSIS — I10 Essential (primary) hypertension: Secondary | ICD-10-CM | POA: Diagnosis not present

## 2020-12-01 DIAGNOSIS — Z5189 Encounter for other specified aftercare: Secondary | ICD-10-CM | POA: Diagnosis not present

## 2020-12-01 DIAGNOSIS — Z5111 Encounter for antineoplastic chemotherapy: Secondary | ICD-10-CM | POA: Diagnosis not present

## 2020-12-01 DIAGNOSIS — F17210 Nicotine dependence, cigarettes, uncomplicated: Secondary | ICD-10-CM | POA: Diagnosis not present

## 2020-12-01 DIAGNOSIS — C8499 Mature T/NK-cell lymphomas, unspecified, extranodal and solid organ sites: Secondary | ICD-10-CM | POA: Diagnosis not present

## 2020-12-01 MED ORDER — PEGFILGRASTIM-CBQV 6 MG/0.6ML ~~LOC~~ SOSY
6.0000 mg | PREFILLED_SYRINGE | Freq: Once | SUBCUTANEOUS | Status: AC
  Administered 2020-12-01: 6 mg via SUBCUTANEOUS

## 2020-12-26 ENCOUNTER — Other Ambulatory Visit: Payer: Self-pay

## 2020-12-26 ENCOUNTER — Ambulatory Visit (HOSPITAL_COMMUNITY)
Admission: RE | Admit: 2020-12-26 | Discharge: 2020-12-26 | Disposition: A | Discharge status: Home / Self Care | Payer: Medicare HMO | Source: Ambulatory Visit | Attending: Hematology and Oncology | Admitting: Hematology and Oncology

## 2020-12-26 DIAGNOSIS — R911 Solitary pulmonary nodule: Secondary | ICD-10-CM | POA: Insufficient documentation

## 2020-12-26 DIAGNOSIS — C8440 Peripheral T-cell lymphoma, not classified, unspecified site: Secondary | ICD-10-CM | POA: Diagnosis not present

## 2020-12-26 DIAGNOSIS — C8442 Peripheral T-cell lymphoma, not classified, intrathoracic lymph nodes: Secondary | ICD-10-CM | POA: Diagnosis not present

## 2020-12-26 DIAGNOSIS — I2510 Atherosclerotic heart disease of native coronary artery without angina pectoris: Secondary | ICD-10-CM | POA: Insufficient documentation

## 2020-12-26 DIAGNOSIS — E042 Nontoxic multinodular goiter: Secondary | ICD-10-CM | POA: Diagnosis not present

## 2020-12-26 DIAGNOSIS — I700 Atherosclerosis of aorta: Secondary | ICD-10-CM | POA: Insufficient documentation

## 2020-12-26 DIAGNOSIS — N281 Cyst of kidney, acquired: Secondary | ICD-10-CM | POA: Insufficient documentation

## 2020-12-26 LAB — GLUCOSE, CAPILLARY: Glucose-Capillary: 128 mg/dL — ABNORMAL HIGH (ref 70–99)

## 2020-12-26 MED ORDER — FLUDEOXYGLUCOSE F - 18 (FDG) INJECTION
10.3000 | Freq: Once | INTRAVENOUS | Status: AC
  Administered 2020-12-26: 10.3 via INTRAVENOUS

## 2021-01-07 DIAGNOSIS — I10 Essential (primary) hypertension: Secondary | ICD-10-CM | POA: Diagnosis not present

## 2021-01-07 DIAGNOSIS — E782 Mixed hyperlipidemia: Secondary | ICD-10-CM | POA: Diagnosis not present

## 2021-01-07 DIAGNOSIS — E1165 Type 2 diabetes mellitus with hyperglycemia: Secondary | ICD-10-CM | POA: Diagnosis not present

## 2021-01-07 DIAGNOSIS — C8590 Non-Hodgkin lymphoma, unspecified, unspecified site: Secondary | ICD-10-CM | POA: Diagnosis not present

## 2021-01-07 DIAGNOSIS — I152 Hypertension secondary to endocrine disorders: Secondary | ICD-10-CM | POA: Diagnosis not present

## 2021-01-07 DIAGNOSIS — E663 Overweight: Secondary | ICD-10-CM | POA: Diagnosis not present

## 2021-01-10 ENCOUNTER — Inpatient Hospital Stay (HOSPITAL_BASED_OUTPATIENT_CLINIC_OR_DEPARTMENT_OTHER): Payer: Medicare HMO | Admitting: Hematology and Oncology

## 2021-01-10 ENCOUNTER — Other Ambulatory Visit: Payer: Self-pay

## 2021-01-10 ENCOUNTER — Inpatient Hospital Stay: Payer: Medicare HMO | Attending: Hematology and Oncology

## 2021-01-10 VITALS — BP 129/62 | HR 116 | Temp 97.9°F | Resp 20 | Wt 216.2 lb

## 2021-01-10 DIAGNOSIS — C8442 Peripheral T-cell lymphoma, not classified, intrathoracic lymph nodes: Secondary | ICD-10-CM | POA: Diagnosis not present

## 2021-01-10 DIAGNOSIS — Z79899 Other long term (current) drug therapy: Secondary | ICD-10-CM | POA: Diagnosis not present

## 2021-01-10 DIAGNOSIS — Z7984 Long term (current) use of oral hypoglycemic drugs: Secondary | ICD-10-CM | POA: Insufficient documentation

## 2021-01-10 DIAGNOSIS — T451X5A Adverse effect of antineoplastic and immunosuppressive drugs, initial encounter: Secondary | ICD-10-CM | POA: Insufficient documentation

## 2021-01-10 DIAGNOSIS — Z95828 Presence of other vascular implants and grafts: Secondary | ICD-10-CM | POA: Diagnosis not present

## 2021-01-10 DIAGNOSIS — F17210 Nicotine dependence, cigarettes, uncomplicated: Secondary | ICD-10-CM | POA: Diagnosis not present

## 2021-01-10 DIAGNOSIS — D6481 Anemia due to antineoplastic chemotherapy: Secondary | ICD-10-CM | POA: Diagnosis not present

## 2021-01-10 DIAGNOSIS — C8499 Mature T/NK-cell lymphomas, unspecified, extranodal and solid organ sites: Secondary | ICD-10-CM | POA: Diagnosis not present

## 2021-01-10 DIAGNOSIS — Z7952 Long term (current) use of systemic steroids: Secondary | ICD-10-CM | POA: Insufficient documentation

## 2021-01-10 LAB — CBC WITH DIFFERENTIAL (CANCER CENTER ONLY)
Abs Immature Granulocytes: 0.02 10*3/uL (ref 0.00–0.07)
Basophils Absolute: 0.1 10*3/uL (ref 0.0–0.1)
Basophils Relative: 2 %
Eosinophils Absolute: 0.9 10*3/uL — ABNORMAL HIGH (ref 0.0–0.5)
Eosinophils Relative: 17 %
HCT: 39.4 % (ref 39.0–52.0)
Hemoglobin: 13.4 g/dL (ref 13.0–17.0)
Immature Granulocytes: 0 %
Lymphocytes Relative: 26 %
Lymphs Abs: 1.4 10*3/uL (ref 0.7–4.0)
MCH: 31.7 pg (ref 26.0–34.0)
MCHC: 34 g/dL (ref 30.0–36.0)
MCV: 93.1 fL (ref 80.0–100.0)
Monocytes Absolute: 0.6 10*3/uL (ref 0.1–1.0)
Monocytes Relative: 11 %
Neutro Abs: 2.3 10*3/uL (ref 1.7–7.7)
Neutrophils Relative %: 44 %
Platelet Count: 187 10*3/uL (ref 150–400)
RBC: 4.23 MIL/uL (ref 4.22–5.81)
RDW: 15.5 % (ref 11.5–15.5)
WBC Count: 5.3 10*3/uL (ref 4.0–10.5)
nRBC: 0 % (ref 0.0–0.2)

## 2021-01-10 LAB — CMP (CANCER CENTER ONLY)
ALT: 13 U/L (ref 0–44)
AST: 18 U/L (ref 15–41)
Albumin: 4 g/dL (ref 3.5–5.0)
Alkaline Phosphatase: 122 U/L (ref 38–126)
Anion gap: 7 (ref 5–15)
BUN: 23 mg/dL (ref 8–23)
CO2: 27 mmol/L (ref 22–32)
Calcium: 9.5 mg/dL (ref 8.9–10.3)
Chloride: 107 mmol/L (ref 98–111)
Creatinine: 0.88 mg/dL (ref 0.61–1.24)
GFR, Estimated: >60 mL/min (ref >60)
Glucose, Bld: 126 mg/dL — ABNORMAL HIGH (ref 70–99)
Potassium: 3.7 mmol/L (ref 3.5–5.1)
Sodium: 141 mmol/L (ref 135–145)
Total Bilirubin: 0.4 mg/dL (ref 0.3–1.2)
Total Protein: 6.9 g/dL (ref 6.5–8.1)

## 2021-01-10 LAB — LACTATE DEHYDROGENASE: LDH: 184 U/L (ref 98–192)

## 2021-01-14 ENCOUNTER — Encounter: Payer: Self-pay | Admitting: Hematology and Oncology

## 2021-01-14 NOTE — Progress Notes (Signed)
[place] [phone]   [phone]  PROGRESS NOTE  Patient Care Team: Fanny Bien, MD as PCP - General (Family Medicine)  Hematological/Oncological History # CD30-Positive T-cell Lymphoma, Stage III/IV 07/09/2020: patient underwent cholecystectomy. Liver wedge biopsy during the procedure revealed involvement of a CD30-positive T-cell lymphoproliferative disorder 07/25/2020: establish care with Dr. Lorenso Courier  08/08/2020: PET CT scan showed innumerable hypermetabolic lesions (primarily Deauville 4) in the liver and skeleton. Left neck adenopathy including a dominant level IB lymph node which is Deauville 5. 08/15/2020: Cycle 1 Day 1 of BV-CHP  09/04/2020: Cycle 2 Day 1 of BV-CHP 09/27/2020:  Cycle 3 Day 1 of BV-CHP 10/18/2020: Cycle 4 Day 1 of BV-CHP 11/08/2020: Cycle 5 Day 1 of BV-CHP 11/29/2020: Cycle 6 Day 1 of BV-CHP 12/26/2020: PET CT scan showed further improvement compared to the 11/05/2020 exam, with mild reduction in activity in the extensive scattered mixed density skeletal lesions (although still Deauville 4) and no substantial hypermetabolic adenopathy currently identified  Interval History:  Maurice Fox 70 y.o. male with medical history significant for T cell lymphoma (Stage III/IV) presents for a follow up visit. The patient's last visit was on 11/29/2020 for Cycle 6 of BV-CHP.   On exam today Maurice Fox is accompanied by his son. He reports he has been "feeling great".  He notes his energy levels are quite high and his appetite has been very good.  He notes that he is not having any residual side effects as result of his chemotherapy treatment.  He not noticed any new lymphadenopathy or had any signs or symptoms of recurrence.  Patient denies any fevers, chills, shortness of breath, chest pain or constipation. He has no other complaints.  A full 10 point ROS is listed below.  The bulk of our discussion focused on the results of the last  PET CT scan on 12/26/2020 and the steps moving forward.   MEDICAL HISTORY:  Past Medical History:  Diagnosis Date   Arthritis    Hyperlipidemia    Hypertension    followed by pcp  (11-23-2019  per pt had stress test greater than 20 yrs ago, told ok)   Nocturia    OSA on CPAP    per pt uses nightly   Phimosis    Type 2 diabetes mellitus ([place])    followed by pcp   (11-23-2019 per pt does not check blood sugar at home)   Wears glasses     SURGICAL HISTORY: Past Surgical History:  Procedure Laterality Date   APPENDECTOMY  child   CHOLECYSTECTOMY N/A 07/09/2020   Procedure: LAPAROSCOPIC CHOLECYSTECTOMY WITH INTRAOPERATIVE CHOLANGIOGRAM,;  Surgeon: Greer Pickerel, MD;  Location: [facility];  Service: General;  Laterality: N/A;   CIRCUMCISION N/A 11/29/2019   Procedure: CIRCUMCISION ADULT;  Surgeon: [name], MD;  Location: [facility];  Service: Urology;  Laterality: N/A;   IR IMAGING GUIDED PORT INSERTION  08/06/2020   LIVER BIOPSY N/A 07/09/2020   Procedure: LAP LIVER BIOPSY;  Surgeon: Greer Pickerel, MD;  Location: [facility];  Service: General;  Laterality: N/A;   ROTATOR CUFF REPAIR Left chld   SUPRA-UMBILICAL HERNIA N/A 1/47/8295   Procedure: PRIMARY REPAIR SUPRA-UMBILICAL HERNIA;  Surgeon: Greer Pickerel, MD;  Location: [facility];  Service: General;  Laterality: N/A;    SOCIAL HISTORY: Social History   Socioeconomic History   Marital status: Divorced    Spouse name: Not on file   Number of children: Not on file   Years  of education: Not on file   Highest education level: Not on file  Occupational History   Not on file  Tobacco Use   Smoking status: Every Day    Packs/day: 1.00    Years: 49.00    Pack years: 49.00    Types: Cigars, Cigarettes   Smokeless tobacco: Never  Vaping Use   Vaping Use: Never used  Substance and Sexual Activity   Alcohol use: Not Currently   Drug use: Never   Sexual activity: Not on file  Other Topics Concern   Not on file   Social History Narrative   Not on file   Social Determinants of Health   Financial Resource Strain: Not on file  Food Insecurity: Not on file  Transportation Needs: Not on file  Physical Activity: Not on file  Stress: Not on file  Social Connections: Not on file  Intimate Partner Violence: Not on file    FAMILY HISTORY: Family History  Problem Relation Age of Onset   Hypotension Mother    Diabetes Mellitus II Father    Prostate cancer Brother    Prostate cancer Brother     ALLERGIES:  has No Known Allergies.  MEDICATIONS:  Current Outpatient Medications  Medication Sig Dispense Refill   allopurinol (ZYLOPRIM) 300 MG tablet Take 1 tablet (300 mg total) by mouth daily. 90 tablet 1   amLODipine (NORVASC) 5 MG tablet 1 tablet     aspirin (BAYER ASPIRIN EC LOW DOSE) 81 MG EC tablet 1 tablet     atorvastatin (LIPITOR) 10 MG tablet Take 1 tablet by mouth daily.     azelastine (ASTELIN) 0.1 % nasal spray 1 puff in each nostril     diclofenac Sodium (VOLTAREN) 1 % GEL Apply 2 g topically 4 (four) times daily as needed for pain.     Empagliflozin-metFORMIN HCl ER 25-1000 MG TB24 Take 1 tablet by mouth at bedtime.     fluticasone (FLONASE) 50 MCG/ACT nasal spray Place 2 sprays into both nostrils in the morning and at bedtime.     lidocaine-prilocaine (EMLA) cream Apply 1 application topically as needed. 30 g 0   methocarbamol (ROBAXIN) 500 MG tablet Take 1 tablet (500 mg total) by mouth every 8 (eight) hours as needed for muscle spasms. 30 tablet 0   Multiple Vitamins-Minerals (CENTRUM SILVER PO) Take 1 tablet by mouth at bedtime.     ondansetron (ZOFRAN) 8 MG tablet Take 1 tablet (8 mg total) by mouth every 8 (eight) hours as needed for nausea or vomiting. 30 tablet 0   predniSONE (DELTASONE) 20 MG tablet Take 3 tablets (60 mg total) by mouth daily with breakfast. 15 tablet 5   prochlorperazine (COMPAZINE) 10 MG tablet Take 1 tablet (10 mg total) by mouth every 6 (six) hours as  needed for nausea or vomiting. 30 tablet 0   Propylene Glycol (SYSTANE BALANCE OP) Place 1 drop into both eyes 2 (two) times daily.     sildenafil (VIAGRA) 100 MG tablet Take 100 mg by mouth daily as needed (ED).     tamsulosin (FLOMAX) 0.4 MG CAPS capsule Take 0.4 mg by mouth at bedtime.     No current facility-administered medications for this visit.    REVIEW OF SYSTEMS:   Constitutional: ( - ) fevers, ( - )  chills , ( - ) night sweats Eyes: ( - ) blurriness of vision, ( - ) double vision, ( - ) watery eyes Ears, nose, mouth, throat, and face: ( - )  mucositis, ( - ) sore throat Respiratory: ( - ) cough, ( - ) dyspnea, ( - ) wheezes Cardiovascular: ( - ) palpitation, ( - ) chest discomfort, ( - ) lower extremity swelling Gastrointestinal:  ( - ) nausea, ( - ) heartburn, ( - ) change in bowel habits Skin: ( - ) abnormal skin rashes Lymphatics: ( - ) new lymphadenopathy, ( - ) easy bruising Neurological: ( - ) numbness, ( - ) tingling, ( - ) new weaknesses Behavioral/Psych: ( - ) mood change, ( - ) new changes  All other systems were reviewed with the patient and are negative.  PHYSICAL EXAMINATION: ECOG PERFORMANCE STATUS: 1 - Symptomatic but completely ambulatory  Vitals:   01/10/21 1050  BP: 129/62  Pulse: (!) 116  Resp: 20  Temp: 97.9 F (36.6 C)  SpO2: 100%      Filed Weights   01/10/21 1050  Weight: 216 lb 3.2 oz (98.1 kg)    GENERAL: Well-appearing elderly African-American male, alert, no distress and comfortable SKIN: skin color, texture, turgor are normal, no rashes or significant lesions EYES: conjunctiva are pink and non-injected, sclera clear NECK: supple, non-tender LYMPH: resolved left submandibular lymph node, otherwise no palpable lymphadenopathy in the cervical, axillary or inguinal LUNGS: clear to auscultation and percussion with normal breathing effort HEART: regular rate & rhythm and no murmurs and no lower extremity edema PSYCH: alert & oriented x  3, fluent speech NEURO: no focal motor/sensory deficits  LABORATORY DATA:  I have reviewed the data as listed CBC Latest Ref Rng & Units 01/10/2021 11/29/2020 11/08/2020  WBC 4.0 - 10.5 K/uL 5.3 7.8 7.2  Hemoglobin 13.0 - 17.0 g/dL 13.4 11.6(L) 11.7(L)  Hematocrit 39.0 - 52.0 % 39.4 35.3(L) 35.3(L)  Platelets 150 - 400 K/uL 187 267 325    CMP Latest Ref Rng & Units 01/10/2021 11/29/2020 11/08/2020  Glucose 70 - 99 mg/dL 126(H) 141(H) 176(H)  BUN 8 - 23 mg/dL 23 23 26(H)  Creatinine 0.61 - 1.24 mg/dL 0.88 0.79 0.87  Sodium 135 - 145 mmol/L 141 141 141  Potassium 3.5 - 5.1 mmol/L 3.7 4.2 3.8  Chloride 98 - 111 mmol/L 107 108 107  CO2 22 - 32 mmol/L _0 Calcium 8.9 - 10.3 mg/dL 9.5 9.1 9.5  Total Protein 6.5 - 8.1 g/dL 6.9 6.9 6.9  Total Bilirubin 0.3 - 1.2 mg/dL 0.4 0.4 0.3  Alkaline Phos 38 - 126 U/L 122 140(H) 141(H)  AST 15 - 41 U/L _1 ALT 0 - 44 U/L _2 RADIOGRAPHIC STUDIES: No images were reviewed during this visit.   ASSESSMENT & PLAN Maurice Fox 70 y.o. male with medical history significant for T cell lymphoma (Stage III/IV) presents for a follow up visit.  After review of the labs, review of the records, and discussion with the patient the patients findings are most consistent with a peripheral CD30 positive T-cell lymphoma.  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  The patient has been staged with a PET CT scan which showed clear involvement of the skeleton and liver, most consistent with stage IV disease.  Given these findings we will plan for 6 cycles of BV CHP chemotherapy.  The regimen of BV CHP consists of brentuximab 1.8 mg/kg IV on day 1, cyclophosphamide 750 mg per metered squared on day 1, doxorubicin 50 mg per metered squared on day 1, and prednisone 60 mg p.o. day 1 through  5.  This is to be continued every 21 days with a plan for up to 6 cycles.  After the third or fourth cycle we will consider  restaging PET CT scan in order to evaluate for response.  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 --TTE performed 07/08/2020, shows EF of 50-55% --negative HIV, Hep B and C serologies. --GCSF support on Day 3 of each cycle.  -LDH, Liver enzymes and Alk Phos are improving. Patient will proceed with treatment today.  --PET CT scan performed on 11/05/2020, findings show Deauville 2 disease with some Deauville 4 noted in the skeleton. Post treatment PET after the last cycle showed diminished FDG avidity, but still Deauville 4.  -- Plan to pursue bone marrow biopsy in order to assure no residual disease in bone marrow. --Return to clinic in 3 months time or sooner if there are concerning findings on bone marrow.  #Chemotherapy Induced Anemia --Hgb 13.4, normalized.  --continue to monitor   #Supportive Care -- chemotherapy education complete -- port placed . Will keep in place until after bone marrow biopsy.  -- zofran 46m q8H PRN and compazine 121mPO q6H for nausea -- allopurinol 30064mO daily for TLS prophylaxis -- EMLA cream for port -- no pain medication required at this time.   Orders Placed This Encounter  Procedures   CT BONE MARROW BIOPSY & ASPIRATION    Standing Status:   Future    Standing Expiration Date:   01/14/2022    Order Specific Question:   Reason for Exam (SYMPTOM  OR DIAGNOSIS REQUIRED)    Answer:   Requesting bone marrow biospy, concern for residual lymphoma on PET in bone marrow.    Order Specific Question:   [facility]    [facility]    Standing Status:   Future    Standing Expiration Date:   01/14/2022    Order Specific Question:   Lab orders requested (DO NOT place separate lab orders, these will be automatically ordered during procedure specimen  collection):    Answer:   Surgical Pathology    Order Specific Question:   Reason for Exam (SYMPTOM  OR DIAGNOSIS REQUIRED)    Answer:   Requesting bone marrow biospy, concern for residual lymphoma on PET in bone marrow.    Order Specific Question:   [facility]    [facility] The patient knows to call the clinic with any problems, questions or concerns.  A total of more than 30 minutes were spent on this encounter with face-to-face time and non-face-to-face time, including preparing to see the patient, ordering medications, counseling the patient and coordination of care as outlined above.   JohLedell PeoplesD Department of Hematology/Oncology [place] [facility]: [phone]: [phone]: [email or web address]  01/14/2021 12:47 PM

## 2021-02-08 ENCOUNTER — Other Ambulatory Visit (HOSPITAL_COMMUNITY): Payer: Self-pay | Admitting: Physician Assistant

## 2021-02-11 ENCOUNTER — Encounter (HOSPITAL_COMMUNITY): Payer: Self-pay

## 2021-02-11 ENCOUNTER — Other Ambulatory Visit: Payer: Self-pay | Admitting: Radiology

## 2021-02-11 ENCOUNTER — Ambulatory Visit (HOSPITAL_COMMUNITY)
Admission: RE | Admit: 2021-02-11 | Discharge: 2021-02-11 | Disposition: A | Discharge status: Home / Self Care | Payer: Medicare HMO | Source: Ambulatory Visit | Attending: Hematology and Oncology | Admitting: Hematology and Oncology

## 2021-02-11 ENCOUNTER — Other Ambulatory Visit: Payer: Self-pay

## 2021-02-11 DIAGNOSIS — E119 Type 2 diabetes mellitus without complications: Secondary | ICD-10-CM | POA: Diagnosis not present

## 2021-02-11 DIAGNOSIS — Z0389 Encounter for observation for other suspected diseases and conditions ruled out: Secondary | ICD-10-CM | POA: Diagnosis not present

## 2021-02-11 DIAGNOSIS — C8442 Peripheral T-cell lymphoma, not classified, intrathoracic lymph nodes: Secondary | ICD-10-CM

## 2021-02-11 LAB — CBC WITH DIFFERENTIAL/PLATELET
Abs Immature Granulocytes: 0.01 10*3/uL (ref 0.00–0.07)
Basophils Absolute: 0 10*3/uL (ref 0.0–0.1)
Basophils Relative: 1 %
Eosinophils Absolute: 0.4 10*3/uL (ref 0.0–0.5)
Eosinophils Relative: 8 %
HCT: 40.1 % (ref 39.0–52.0)
Hemoglobin: 13.4 g/dL (ref 13.0–17.0)
Immature Granulocytes: 0 %
Lymphocytes Relative: 28 %
Lymphs Abs: 1.2 10*3/uL (ref 0.7–4.0)
MCH: 31.1 pg (ref 26.0–34.0)
MCHC: 33.4 g/dL (ref 30.0–36.0)
MCV: 93 fL (ref 80.0–100.0)
Monocytes Absolute: 0.4 10*3/uL (ref 0.1–1.0)
Monocytes Relative: 10 %
Neutro Abs: 2.3 10*3/uL (ref 1.7–7.7)
Neutrophils Relative %: 53 %
Platelets: 165 10*3/uL (ref 150–400)
RBC: 4.31 MIL/uL (ref 4.22–5.81)
RDW: 14.6 % (ref 11.5–15.5)
WBC: 4.4 10*3/uL (ref 4.0–10.5)
nRBC: 0 % (ref 0.0–0.2)

## 2021-02-11 LAB — GLUCOSE, CAPILLARY: Glucose-Capillary: 180 mg/dL — ABNORMAL HIGH (ref 70–99)

## 2021-02-11 MED ORDER — FENTANYL CITRATE (PF) 100 MCG/2ML IJ SOLN
INTRAMUSCULAR | Status: DC | PRN
  Administered 2021-02-11: 50 ug via INTRAVENOUS

## 2021-02-11 MED ORDER — SODIUM CHLORIDE 0.9 % IV SOLN: INTRAVENOUS | Status: DC

## 2021-02-11 MED ORDER — HEPARIN SOD (PORK) LOCK FLUSH 100 UNIT/ML IV SOLN
500.0000 [IU] | INTRAVENOUS | Status: AC | PRN
  Administered 2021-02-11: 500 [IU]

## 2021-02-11 MED ORDER — HEPARIN SOD (PORK) LOCK FLUSH 100 UNIT/ML IV SOLN
INTRAVENOUS | Status: AC
  Filled 2021-02-11: qty 5

## 2021-02-11 MED ORDER — FENTANYL CITRATE (PF) 100 MCG/2ML IJ SOLN
INTRAMUSCULAR | Status: AC
  Filled 2021-02-11: qty 2

## 2021-02-11 NOTE — H&P (Signed)
Referring Physician(s): [name],[name]  Supervising Physician: Corrie Mckusick  Patient Status:  [facility]  Chief Complaint:  T cell lymphoma  Subjective: Patient familiar to IR service from Port-A-Cath placement on 08/06/2020.  He has a history of T-cell lymphoma and presents today for CT-guided bone marrow biopsy to rule out marrow involvement.  He denies fever, headache, chest pain, dyspnea, cough, abdominal/back pain, nausea, vomiting or bleeding.  Additional medical history as below.  Past Medical History:  Diagnosis Date   Arthritis    Hyperlipidemia    Hypertension    followed by pcp  (11-23-2019  per pt had stress test greater than 20 yrs ago, told ok)   Nocturia    OSA on CPAP    per pt uses nightly   Phimosis    Type 2 diabetes mellitus ([place])    followed by pcp   (11-23-2019 per pt does not check blood sugar at home)   Wears glasses    Past Surgical History:  Procedure Laterality Date   APPENDECTOMY  child   CHOLECYSTECTOMY N/A 07/09/2020   Procedure: LAPAROSCOPIC CHOLECYSTECTOMY WITH INTRAOPERATIVE CHOLANGIOGRAM,;  Surgeon: Greer Pickerel, MD;  Location: [facility];  Service: General;  Laterality: N/A;   CIRCUMCISION N/A 11/29/2019   Procedure: CIRCUMCISION ADULT;  Surgeon: [name], MD;  Location: [facility];  Service: Urology;  Laterality: N/A;   IR IMAGING GUIDED PORT INSERTION  08/06/2020   LIVER BIOPSY N/A 07/09/2020   Procedure: LAP LIVER BIOPSY;  Surgeon: Greer Pickerel, MD;  Location: [facility];  Service: General;  Laterality: N/A;   ROTATOR CUFF REPAIR Left chld   SUPRA-UMBILICAL HERNIA N/A 7/67/3419   Procedure: PRIMARY REPAIR SUPRA-UMBILICAL HERNIA;  Surgeon: Greer Pickerel, MD;  Location: [facility];  Service: General;  Laterality: N/A;      Allergies: Patient has no known allergies.  Medications: Prior to Admission medications   Medication Sig Start Date End Date Taking? Authorizing Provider  allopurinol (ZYLOPRIM) 300 MG tablet Take 1  tablet (300 mg total) by mouth daily. 08/15/20  Yes [name], MD  amLODipine (NORVASC) 5 MG tablet 1 tablet 10/13/19  Yes [provider]  aspirin 81 MG EC tablet 1 tablet   Yes [provider]  atorvastatin (LIPITOR) 10 MG tablet Take 1 tablet by mouth daily. 10/13/19  Yes [provider]  azelastine (ASTELIN) 0.1 % nasal spray 1 puff in each nostril   Yes [provider]  diclofenac Sodium (VOLTAREN) 1 % GEL Apply 2 g topically 4 (four) times daily as needed for pain. 03/28/20  Yes [provider]  Empagliflozin-metFORMIN HCl ER 25-1000 MG TB24 Take 1 tablet by mouth at bedtime.   Yes [provider]  fluticasone (FLONASE) 50 MCG/ACT nasal spray Place 2 sprays into both nostrils in the morning and at bedtime.   Yes [provider]  lidocaine-prilocaine (EMLA) cream Apply 1 application topically as needed. 08/15/20  Yes [name], MD  methocarbamol (ROBAXIN) 500 MG tablet Take 1 tablet (500 mg total) by mouth every 8 (eight) hours as needed for muscle spasms. 08/02/20  Yes Maudie Flakes, MD  Multiple Vitamins-Minerals (CENTRUM SILVER PO) Take 1 tablet by mouth at bedtime.   Yes [provider]  Propylene Glycol (SYSTANE BALANCE OP) Place 1 drop into both eyes 2 (two) times daily.   Yes [provider]  sildenafil (VIAGRA) 100 MG tablet Take 100 mg by mouth daily as needed (ED).  Yes [provider]  ondansetron (ZOFRAN) 8 MG tablet Take 1 tablet (8 mg total) by mouth every 8 (eight) hours as needed for nausea or vomiting. 08/15/20   [name], MD  predniSONE (DELTASONE) 20 MG tablet Take 3 tablets (60 mg total) by mouth daily with breakfast. 08/15/20   [name], MD  prochlorperazine (COMPAZINE) 10 MG tablet Take 1 tablet (10 mg total) by mouth every 6 (six) hours as needed for nausea or vomiting. 08/15/20   [name], MD  tamsulosin (FLOMAX) 0.4 MG CAPS capsule Take 0.4 mg by  mouth at bedtime.    [provider]     Vital Signs: BP 127/82    Pulse (!) 111    Temp 98.5 F (36.9 C) (Oral)    Resp 20    Ht _0  (1.778 m)    Wt 215 lb (97.5 kg)    SpO2 100%    BMI 30.85 kg/m   Physical Exam awake, alert.  Chest clear to auscultation bilaterally; clean, intact right chest wall Port-A-Cath.  Heart with regular rate and rhythm.  Abdomen soft, positive bowel sounds, nontender.  No lower extremity edema.  Imaging: No results found.  Labs:  CBC: Recent Labs    10/18/20 1111 11/08/20 1005 11/29/20 1018 01/10/21 1024  WBC 8.1 7.2 7.8 5.3  HGB 11.7* 11.7* 11.6* 13.4  HCT 36.1* 35.3* 35.3* 39.4  PLT 330 325 267 187    COAGS: No results for input(s): INR, APTT in the last 8760 hours.  BMP: Recent Labs    10/18/20 1111 11/08/20 1005 11/29/20 1018 01/10/21 1024  NA 141 141 141 141  K 3.5 3.8 4.2 3.7  CL 106 107 108 107  CO2 _1 GLUCOSE 194* 176* 141* 126*  BUN 25* 26* 23 23  CALCIUM 9.5 9.5 9.1 9.5  CREATININE 0.91 0.87 0.79 0.88  GFRNONAA >60 >60 >60 >60    LIVER FUNCTION TESTS: Recent Labs    10/18/20 1111 11/08/20 1005 11/29/20 1018 01/10/21 1024  BILITOT 0.6 0.3 0.4 0.4  AST _2 ALT _3 ALKPHOS 165* 141* 140* 122  PROT 7.1 6.9 6.9 6.9  ALBUMIN 3.9 3.8 3.6 4.0    Assessment and Plan: Patient familiar to IR service from Port-A-Cath placement on 08/06/2020.  He has a history of T-cell lymphoma and presents today for CT-guided bone marrow biopsy to rule out marrow involvement.Risks and benefits of procedure was discussed with the patient including, but not limited to bleeding, infection, damage to adjacent structures or low yield requiring additional tests.  All of the questions were answered and there is agreement to proceed.  Consent signed and in chart.    Electronically Signed: D. Rowe Robert, PA-C 02/11/2021, 8:32 AM   I spent a total of 20 minutes at the the patient's bedside AND on  the patient's hospital floor or unit, greater than 50% of which was counseling/coordinating care for CT guided bone marrow biopsy

## 2021-02-11 NOTE — Procedures (Signed)
Interventional Radiology Procedure Note  Procedure: CT guided aspirate and core biopsy of right posterior iliac bone Complications: None Recommendations: - Local wound care - OTC's PRN  Pain - Follow biopsy results  Signed,  Dulcy Fanny. Earleen Newport, DO

## 2021-02-11 NOTE — Discharge Instructions (Signed)
For questions /concerns may call Interventional Radiology at [phone]  You may remove your dressing and shower tomorrow afternoon      Bone Marrow Aspiration and Bone Marrow Biopsy, Adult, Care After This sheet gives you information about how to care for yourself after your procedure. Your health care provider may also give you more specific instructions. If you have problems or questions, contact your health careprovider. What can I expect after the procedure? After the procedure, it is common to have: Mild pain and tenderness. Swelling. Bruising. Follow these instructions at home: Puncture site care Follow instructions from your health care provider about how to take care of the puncture site. Make sure you: Wash your hands with soap and water before and after you change your bandage (dressing). If soap and water are not available, use hand sanitizer. Change your dressing as told by your health care provider. Check your puncture site every day for signs of infection. Check for: More redness, swelling, or pain. Fluid or blood. Warmth. Pus or a bad smell.  Activity Return to your normal activities as told by your health care provider. Ask your health care provider what activities are safe for you. Do not lift anything that is heavier than 10 lb (4.5 kg), or the limit that you are told, until your health care provider says that it is safe. Do not drive for 24 hours if you were given a sedative during your procedure. General instructions Take over-the-counter and prescription medicines only as told by your health care provider. Do not take baths, swim, or use a hot tub until your health care provider approves. Ask your health care provider if you may take showers. You may only be allowed to take sponge baths. If directed, put ice on the affected area. To do this: Put ice in a plastic bag. Place a towel between your skin and the bag. Leave the ice on for 20 minutes, 2-3 times a  day. Keep all follow-up visits as told by your health care provider. This is important.  Contact a health care provider if: Your pain is not controlled with medicine. You have a fever. You have more redness, swelling, or pain around the puncture site. You have fluid or blood coming from the puncture site. Your puncture site feels warm to the touch. You have pus or a bad smell coming from the puncture site. Summary After the procedure, it is common to have mild pain, tenderness, swelling, and bruising. Follow instructions from your health care provider about how to take care of the puncture site and what activities are safe for you. Take over-the-counter and prescription medicines only as told by your health care provider. Contact a health care provider if you have any signs of infection, such as fluid or blood coming from the puncture site. This information is not intended to replace advice given to you by your health care provider. Make sure you discuss any questions you have with your healthcare provider. Document Revised: 05/25/2018 Document Reviewed: 05/25/2018 Elsevier Patient Education  [employer].

## 2021-02-14 ENCOUNTER — Encounter (HOSPITAL_COMMUNITY): Payer: Self-pay | Admitting: Certified Registered"

## 2021-02-15 LAB — SURGICAL PATHOLOGY

## 2021-02-18 ENCOUNTER — Encounter: Payer: Self-pay | Admitting: Hematology and Oncology

## 2021-02-19 ENCOUNTER — Encounter (HOSPITAL_COMMUNITY): Payer: Self-pay | Admitting: Hematology and Oncology

## 2021-02-19 LAB — SURGICAL PATHOLOGY

## 2021-02-26 ENCOUNTER — Encounter: Payer: Self-pay | Admitting: Hematology and Oncology

## 2021-02-27 ENCOUNTER — Telehealth: Payer: Self-pay | Admitting: *Deleted

## 2021-02-27 DIAGNOSIS — D179 Benign lipomatous neoplasm, unspecified: Secondary | ICD-10-CM | POA: Diagnosis not present

## 2021-02-27 DIAGNOSIS — E663 Overweight: Secondary | ICD-10-CM | POA: Diagnosis not present

## 2021-02-27 DIAGNOSIS — E1165 Type 2 diabetes mellitus with hyperglycemia: Secondary | ICD-10-CM | POA: Diagnosis not present

## 2021-02-27 DIAGNOSIS — I10 Essential (primary) hypertension: Secondary | ICD-10-CM | POA: Diagnosis not present

## 2021-02-27 NOTE — Telephone Encounter (Signed)
Attempted call to patient regarding results of bone marrow biopsy and PET Scan. No answer and no vm available. °Will try at another time. °

## 2021-02-27 NOTE — Telephone Encounter (Signed)
-----  Message from [name], MD sent at 02/15/2021  9:23 AM EST ----- Please let Maurice Fox know that his Bone marrow biopsy has returned. There is no evidence of residual cancer in his bone marrow. This, combined with his PET CT scan imply that he is currently in remission from his lymphoma. We will plan to see him back as scheduled in March 2023.   ----- Message ----- From: Buel Ream, [facility] Sent: 02/11/2021   8:41 AM EST To: [name], MD

## 2021-03-05 ENCOUNTER — Encounter: Payer: Self-pay | Admitting: Hematology and Oncology

## 2021-03-05 DIAGNOSIS — R739 Hyperglycemia, unspecified: Secondary | ICD-10-CM | POA: Diagnosis not present

## 2021-03-26 ENCOUNTER — Telehealth: Payer: Self-pay

## 2021-03-26 NOTE — Telephone Encounter (Signed)
Attempted to call Patient regarding questions for Accommodation Form received. Unable to reach Patient and unable to leave voicemail at telephone number [phone].  ?

## 2021-03-28 ENCOUNTER — Inpatient Hospital Stay: Payer: Medicare HMO

## 2021-03-28 ENCOUNTER — Other Ambulatory Visit: Payer: Self-pay

## 2021-03-28 ENCOUNTER — Inpatient Hospital Stay: Payer: Medicare HMO | Attending: Hematology and Oncology | Admitting: Hematology and Oncology

## 2021-03-28 VITALS — BP 157/87 | HR 113 | Temp 98.4°F | Resp 17 | Wt 226.2 lb

## 2021-03-28 DIAGNOSIS — C8442 Peripheral T-cell lymphoma, not classified, intrathoracic lymph nodes: Secondary | ICD-10-CM

## 2021-03-28 DIAGNOSIS — Z95828 Presence of other vascular implants and grafts: Secondary | ICD-10-CM

## 2021-03-28 DIAGNOSIS — F17210 Nicotine dependence, cigarettes, uncomplicated: Secondary | ICD-10-CM | POA: Diagnosis not present

## 2021-03-28 DIAGNOSIS — C8499 Mature T/NK-cell lymphomas, unspecified, extranodal and solid organ sites: Secondary | ICD-10-CM | POA: Insufficient documentation

## 2021-03-28 LAB — CMP (CANCER CENTER ONLY)
ALT: 23 U/L (ref 0–44)
AST: 18 U/L (ref 15–41)
Albumin: 4.2 g/dL (ref 3.5–5.0)
Alkaline Phosphatase: 116 U/L (ref 38–126)
Anion gap: 7 (ref 5–15)
BUN: 25 mg/dL — ABNORMAL HIGH (ref 8–23)
CO2: 26 mmol/L (ref 22–32)
Calcium: 9.5 mg/dL (ref 8.9–10.3)
Chloride: 105 mmol/L (ref 98–111)
Creatinine: 0.93 mg/dL (ref 0.61–1.24)
GFR, Estimated: >60 mL/min (ref >60)
Glucose, Bld: 231 mg/dL — ABNORMAL HIGH (ref 70–99)
Potassium: 3.6 mmol/L (ref 3.5–5.1)
Sodium: 138 mmol/L (ref 135–145)
Total Bilirubin: 0.3 mg/dL (ref 0.3–1.2)
Total Protein: 7 g/dL (ref 6.5–8.1)

## 2021-03-28 LAB — CBC WITH DIFFERENTIAL (CANCER CENTER ONLY)
Abs Immature Granulocytes: 0.01 10*3/uL (ref 0.00–0.07)
Basophils Absolute: 0 10*3/uL (ref 0.0–0.1)
Basophils Relative: 1 %
Eosinophils Absolute: 0.3 10*3/uL (ref 0.0–0.5)
Eosinophils Relative: 5 %
HCT: 42.7 % (ref 39.0–52.0)
Hemoglobin: 14.6 g/dL (ref 13.0–17.0)
Immature Granulocytes: 0 %
Lymphocytes Relative: 30 %
Lymphs Abs: 1.5 10*3/uL (ref 0.7–4.0)
MCH: 29.7 pg (ref 26.0–34.0)
MCHC: 34.2 g/dL (ref 30.0–36.0)
MCV: 87 fL (ref 80.0–100.0)
Monocytes Absolute: 0.5 10*3/uL (ref 0.1–1.0)
Monocytes Relative: 10 %
Neutro Abs: 2.7 10*3/uL (ref 1.7–7.7)
Neutrophils Relative %: 54 %
Platelet Count: 162 10*3/uL (ref 150–400)
RBC: 4.91 MIL/uL (ref 4.22–5.81)
RDW: 14.5 % (ref 11.5–15.5)
WBC Count: 4.9 10*3/uL (ref 4.0–10.5)
nRBC: 0 % (ref 0.0–0.2)

## 2021-03-28 NOTE — Progress Notes (Signed)
[place] [phone]   [phone]  PROGRESS NOTE  Patient Care Team: Fanny Bien, MD as PCP - General (Family Medicine)  Hematological/Oncological History # CD30-Positive T-cell Lymphoma, Stage III/IV 07/09/2020: patient underwent cholecystectomy. Liver wedge biopsy during the procedure revealed involvement of a CD30-positive T-cell lymphoproliferative disorder 07/25/2020: establish care with Dr. Lorenso Courier  08/08/2020: PET CT scan showed innumerable hypermetabolic lesions (primarily Deauville 4) in the liver and skeleton. Left neck adenopathy including a dominant level IB lymph node which is Deauville 5. 08/15/2020: Cycle 1 Day 1 of BV-CHP  09/04/2020: Cycle 2 Day 1 of BV-CHP 09/27/2020:  Cycle 3 Day 1 of BV-CHP 10/18/2020: Cycle 4 Day 1 of BV-CHP 11/08/2020: Cycle 5 Day 1 of BV-CHP 11/29/2020: Cycle 6 Day 1 of BV-CHP 12/26/2020: PET CT scan showed further improvement compared to the 11/05/2020 exam, with mild reduction in activity in the extensive scattered mixed density skeletal lesions (although still Deauville 4) and no substantial hypermetabolic adenopathy currently identified 02/11/2021: Bone marrow biopsy performed due to concern for FDG avidity of bone marrow.  No evidence of residual disease noted on bone marrow biopsy.  Interval History:  Maurice Fox 71 y.o. male with medical history significant for T cell lymphoma (Stage III/IV) presents for a follow up visit. The patient's last visit was on 01/10/2021.  In the interim since that time he has undergone a bone marrow biopsy which shows no evidence of residual disease.  On exam today Maurice Fox is accompanied by his male friend. He reports he has good levels of energy and reports that they are 9 out of 10.  He reports his appetite is good and his weight has been steadily increasing.  He notes that he is not having any major issues other than some mild tingling in his fingers.  He not having  any neuropathy in his toes.  He is eager to have his port removed.  He is not having any nausea, ming, or diarrhea.  He not noticed any new lymphadenopathy or had any signs or symptoms of recurrence.  Patient denies any fevers, chills, shortness of breath, chest pain or constipation. He has no other complaints.  A full 10 point ROS is listed below.  MEDICAL HISTORY:  Past Medical History:  Diagnosis Date   Arthritis    Hyperlipidemia    Hypertension    followed by pcp  (11-23-2019  per pt had stress test greater than 20 yrs ago, told ok)   Nocturia    OSA on CPAP    per pt uses nightly   Phimosis    Type 2 diabetes mellitus ([place])    followed by pcp   (11-23-2019 per pt does not check blood sugar at home)   Wears glasses     SURGICAL HISTORY: Past Surgical History:  Procedure Laterality Date   APPENDECTOMY  child   CHOLECYSTECTOMY N/A 07/09/2020   Procedure: LAPAROSCOPIC CHOLECYSTECTOMY WITH INTRAOPERATIVE CHOLANGIOGRAM,;  Surgeon: Greer Pickerel, MD;  Location: [facility];  Service: General;  Laterality: N/A;   CIRCUMCISION N/A 11/29/2019   Procedure: CIRCUMCISION ADULT;  Surgeon: [name], MD;  Location: [facility];  Service: Urology;  Laterality: N/A;   IR IMAGING GUIDED PORT INSERTION  08/06/2020   LIVER BIOPSY N/A 07/09/2020   Procedure: LAP LIVER BIOPSY;  Surgeon: Greer Pickerel, MD;  Location: [facility];  Service: General;  Laterality: N/A;   ROTATOR CUFF REPAIR Left chld   SUPRA-UMBILICAL HERNIA N/A 05/05/8307   Procedure:  PRIMARY REPAIR SUPRA-UMBILICAL HERNIA;  Surgeon: Greer Pickerel, MD;  Location: [facility];  Service: General;  Laterality: N/A;    SOCIAL HISTORY: Social History   Socioeconomic History   Marital status: Divorced    Spouse name: Not on file   Number of children: Not on file   Years of education: Not on file   Highest education level: Not on file  Occupational History   Not on file  Tobacco Use   Smoking status: Every Day    Packs/day: 1.00     Years: 49.00    Pack years: 49.00    Types: Cigars, Cigarettes   Smokeless tobacco: Never  Vaping Use   Vaping Use: Never used  Substance and Sexual Activity   Alcohol use: Not Currently   Drug use: Never   Sexual activity: Not on file  Other Topics Concern   Not on file  Social History Narrative   Not on file   Social Determinants of Health   Financial Resource Strain: Not on file  Food Insecurity: Not on file  Transportation Needs: Not on file  Physical Activity: Not on file  Stress: Not on file  Social Connections: Not on file  Intimate Partner Violence: Not on file    FAMILY HISTORY: Family History  Problem Relation Age of Onset   Hypotension Mother    Diabetes Mellitus II Father    Prostate cancer Brother    Prostate cancer Brother     ALLERGIES:  has No Known Allergies.  MEDICATIONS:  Current Outpatient Medications  Medication Sig Dispense Refill   amLODipine (NORVASC) 5 MG tablet 1 tablet     aspirin 81 MG EC tablet 1 tablet     atorvastatin (LIPITOR) 10 MG tablet Take 1 tablet by mouth daily.     azelastine (ASTELIN) 0.1 % nasal spray 1 puff in each nostril     diclofenac Sodium (VOLTAREN) 1 % GEL Apply 2 g topically 4 (four) times daily as needed for pain.     Empagliflozin-metFORMIN HCl ER 25-1000 MG TB24 Take 1 tablet by mouth at bedtime.     fluticasone (FLONASE) 50 MCG/ACT nasal spray Place 2 sprays into both nostrils in the morning and at bedtime.     lidocaine-prilocaine (EMLA) cream Apply 1 application topically as needed. 30 g 0   methocarbamol (ROBAXIN) 500 MG tablet Take 1 tablet (500 mg total) by mouth every 8 (eight) hours as needed for muscle spasms. 30 tablet 0   Multiple Vitamins-Minerals (CENTRUM SILVER PO) Take 1 tablet by mouth at bedtime.     ondansetron (ZOFRAN) 8 MG tablet Take 1 tablet (8 mg total) by mouth every 8 (eight) hours as needed for nausea or vomiting. (Patient not taking: Reported on 03/28/2021) 30 tablet 0   predniSONE  (DELTASONE) 20 MG tablet Take 3 tablets (60 mg total) by mouth daily with breakfast. 15 tablet 5   prochlorperazine (COMPAZINE) 10 MG tablet Take 1 tablet (10 mg total) by mouth every 6 (six) hours as needed for nausea or vomiting. (Patient not taking: Reported on 03/28/2021) 30 tablet 0   Propylene Glycol (SYSTANE BALANCE OP) Place 1 drop into both eyes 2 (two) times daily.     sildenafil (VIAGRA) 100 MG tablet Take 100 mg by mouth daily as needed (ED).     tamsulosin (FLOMAX) 0.4 MG CAPS capsule Take 0.4 mg by mouth at bedtime.     No current facility-administered medications for this visit.    REVIEW OF SYSTEMS:  Constitutional: ( - ) fevers, ( - )  chills , ( - ) night sweats Eyes: ( - ) blurriness of vision, ( - ) double vision, ( - ) watery eyes Ears, nose, mouth, throat, and face: ( - ) mucositis, ( - ) sore throat Respiratory: ( - ) cough, ( - ) dyspnea, ( - ) wheezes Cardiovascular: ( - ) palpitation, ( - ) chest discomfort, ( - ) lower extremity swelling Gastrointestinal:  ( - ) nausea, ( - ) heartburn, ( - ) change in bowel habits Skin: ( - ) abnormal skin rashes Lymphatics: ( - ) new lymphadenopathy, ( - ) easy bruising Neurological: ( - ) numbness, ( - ) tingling, ( - ) new weaknesses Behavioral/Psych: ( - ) mood change, ( - ) new changes  All other systems were reviewed with the patient and are negative.  PHYSICAL EXAMINATION: ECOG PERFORMANCE STATUS: 1 - Symptomatic but completely ambulatory  Vitals:   03/28/21 1459  BP: (!) 157/87  Pulse: (!) 113  Resp: 17  Temp: 98.4 F (36.9 C)  SpO2: 97%    Filed Weights   03/28/21 1459  Weight: 226 lb 3.2 oz (102.6 kg)    GENERAL: Well-appearing elderly African-American male, alert, no distress and comfortable SKIN: skin color, texture, turgor are normal, no rashes or significant lesions EYES: conjunctiva are pink and non-injected, sclera clear NECK: supple, non-tender LYMPH: resolved left submandibular lymph node,  otherwise no palpable lymphadenopathy in the cervical, axillary or inguinal LUNGS: clear to auscultation and percussion with normal breathing effort HEART: regular rate & rhythm and no murmurs and no lower extremity edema PSYCH: alert & oriented x 3, fluent speech NEURO: no focal motor/sensory deficits  LABORATORY DATA:  I have reviewed the data as listed CBC Latest Ref Rng & Units 03/28/2021 02/11/2021 01/10/2021  WBC 4.0 - 10.5 K/uL 4.9 4.4 5.3  Hemoglobin 13.0 - 17.0 g/dL 14.6 13.4 13.4  Hematocrit 39.0 - 52.0 % 42.7 40.1 39.4  Platelets 150 - 400 K/uL 162 165 187    CMP Latest Ref Rng & Units 03/28/2021 01/10/2021 11/29/2020  Glucose 70 - 99 mg/dL 231(H) 126(H) 141(H)  BUN 8 - 23 mg/dL 25(H) 23 23  Creatinine 0.61 - 1.24 mg/dL 0.93 0.88 0.79  Sodium 135 - 145 mmol/L 138 141 141  Potassium 3.5 - 5.1 mmol/L 3.6 3.7 4.2  Chloride 98 - 111 mmol/L 105 107 108  CO2 22 - 32 mmol/L _0 Calcium 8.9 - 10.3 mg/dL 9.5 9.5 9.1  Total Protein 6.5 - 8.1 g/dL 7.0 6.9 6.9  Total Bilirubin 0.3 - 1.2 mg/dL 0.3 0.4 0.4  Alkaline Phos 38 - 126 U/L 116 122 140(H)  AST 15 - 41 U/L _1 ALT 0 - 44 U/L _2 RADIOGRAPHIC STUDIES: No images were reviewed during this visit.   ASSESSMENT & PLAN Maurice Fox 71 y.o. male with medical history significant for T cell lymphoma (Stage III/IV) presents for a follow up visit.  After review of the labs, review of the records, and discussion with the patient the patients findings are most consistent with a peripheral CD30 positive T-cell lymphoma.  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  The patient has been staged with a PET CT scan which showed clear involvement of the skeleton and liver, most consistent with stage IV disease.  Given these findings we will plan for 6 cycles of BV CHP chemotherapy.  The regimen of BV CHP consists of brentuximab 1.8 mg/kg IV on day 1, cyclophosphamide 750 mg per metered  squared on day 1, doxorubicin 50 mg per metered squared on day 1, and prednisone 60 mg p.o. day 1 through 5.  This is to be continued every 21 days with a plan for up to 6 cycles.  After the third or fourth cycle we will consider restaging PET CT scan in order to evaluate for response.  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 --TTE performed 07/08/2020, shows EF of 50-55% --negative HIV, Hep B and C serologies. --GCSF support on Day 3 of each cycle.  -LDH, Liver enzymes and Alk Phos are improving. Patient will proceed with treatment today.  --PET CT scan performed on 11/05/2020, findings show Deauville 2 disease with some Deauville 4 noted in the skeleton. Post treatment PET after the last cycle showed diminished FDG avidity, but still Deauville 4.  -- Bone marrow biopsy on 02/11/2021 shows no evidence of residual disease. Plan: -- Per NCCN recommendations we will have the patient return to clinic every 3 to 6 months with imaging on a 68-monthbasis for the first 2 years --Labs at each clinic visit to include CBC, CMP, LDH --Return to clinic in 3 months time or sooner if he were to have any new or concerning symptoms.  #Chemotherapy Induced Anemia- resolved  --Hgb 14.6, normalized.  --continue to monitor   #Supportive Care -- port placed . OK to have removed   Orders Placed This Encounter  Procedures   CT CHEST ABDOMEN PELVIS W CONTRAST    Standing Status:   Future    Standing Expiration Date:   03/29/2022    Order Specific Question:   [facility]    [facility]   Order Specific Question:   Release to patient    Answer:   Immediate    Order Specific Question:   Is Oral Contrast requested for this exam?    Answer:   Yes, Per Radiology protocol   IR Removal Tun Access W/ Port W/O FL     Standing Status:   Future    Standing Expiration Date:   03/29/2022    Order Specific Question:   Reason for exam:    Answer:   Completed chemotherapy and complete remission, requesting port removal.    Order Specific Question:   [facility]    [facility]  All questions were answered. The patient knows to call the clinic with any problems, questions or concerns.  A total of more than 30 minutes were spent on this encounter with face-to-face time and non-face-to-face time, including preparing to see the patient, ordering medications, counseling the patient and coordination of care as outlined above.   JLedell Peoples MD Department of Hematology/Oncology CSandpointat [facility]Phone: [phone]: [phone]: [email or web address]  03/28/2021 3:44 PM

## 2021-03-29 ENCOUNTER — Telehealth: Payer: Self-pay

## 2021-03-29 NOTE — Telephone Encounter (Signed)
Attempted to call Patient regarding information needed for Accommodation Questionnaire for Health Care Provider.  Unable to reach Patient and unable to leave voicemail at telephone number [phone] ?

## 2021-04-01 DIAGNOSIS — E1165 Type 2 diabetes mellitus with hyperglycemia: Secondary | ICD-10-CM | POA: Diagnosis not present

## 2021-04-01 DIAGNOSIS — I10 Essential (primary) hypertension: Secondary | ICD-10-CM | POA: Diagnosis not present

## 2021-04-04 ENCOUNTER — Ambulatory Visit (HOSPITAL_COMMUNITY): Payer: Medicare HMO

## 2021-04-08 ENCOUNTER — Other Ambulatory Visit: Payer: Self-pay | Admitting: Radiology

## 2021-04-09 ENCOUNTER — Other Ambulatory Visit: Payer: Self-pay

## 2021-04-09 ENCOUNTER — Encounter (HOSPITAL_COMMUNITY): Payer: Self-pay

## 2021-04-09 ENCOUNTER — Ambulatory Visit (HOSPITAL_COMMUNITY)
Admission: RE | Admit: 2021-04-09 | Discharge: 2021-04-09 | Disposition: A | Discharge status: Home / Self Care | Payer: Medicare HMO | Source: Ambulatory Visit | Attending: Hematology and Oncology | Admitting: Hematology and Oncology

## 2021-04-09 DIAGNOSIS — E785 Hyperlipidemia, unspecified: Secondary | ICD-10-CM | POA: Diagnosis not present

## 2021-04-09 DIAGNOSIS — G4733 Obstructive sleep apnea (adult) (pediatric): Secondary | ICD-10-CM | POA: Diagnosis not present

## 2021-04-09 DIAGNOSIS — Z7982 Long term (current) use of aspirin: Secondary | ICD-10-CM | POA: Insufficient documentation

## 2021-04-09 DIAGNOSIS — Z7984 Long term (current) use of oral hypoglycemic drugs: Secondary | ICD-10-CM | POA: Insufficient documentation

## 2021-04-09 DIAGNOSIS — Z452 Encounter for adjustment and management of vascular access device: Secondary | ICD-10-CM | POA: Insufficient documentation

## 2021-04-09 DIAGNOSIS — E119 Type 2 diabetes mellitus without complications: Secondary | ICD-10-CM | POA: Diagnosis not present

## 2021-04-09 DIAGNOSIS — Z9221 Personal history of antineoplastic chemotherapy: Secondary | ICD-10-CM | POA: Diagnosis not present

## 2021-04-09 DIAGNOSIS — Z95828 Presence of other vascular implants and grafts: Secondary | ICD-10-CM

## 2021-04-09 DIAGNOSIS — I10 Essential (primary) hypertension: Secondary | ICD-10-CM | POA: Insufficient documentation

## 2021-04-09 DIAGNOSIS — Z79899 Other long term (current) drug therapy: Secondary | ICD-10-CM | POA: Insufficient documentation

## 2021-04-09 DIAGNOSIS — Z856 Personal history of leukemia: Secondary | ICD-10-CM | POA: Diagnosis not present

## 2021-04-09 HISTORY — PX: IR REMOVAL TUN ACCESS W/ PORT W/O FL MOD SED: IMG2290

## 2021-04-09 LAB — GLUCOSE, CAPILLARY: Glucose-Capillary: 158 mg/dL — ABNORMAL HIGH (ref 70–99)

## 2021-04-09 MED ORDER — LIDOCAINE-EPINEPHRINE 1 %-1:100000 IJ SOLN
INTRAMUSCULAR | Status: AC
  Filled 2021-04-09: qty 1

## 2021-04-09 MED ORDER — LIDOCAINE-EPINEPHRINE 1 %-1:100000 IJ SOLN
INTRAMUSCULAR | Status: AC | PRN
  Administered 2021-04-09: 10 mL via INTRADERMAL

## 2021-04-09 MED ORDER — SODIUM CHLORIDE 0.9 % IV SOLN: INTRAVENOUS | Status: DC

## 2021-04-09 MED ORDER — FENTANYL CITRATE (PF) 100 MCG/2ML IJ SOLN
INTRAMUSCULAR | Status: AC
  Filled 2021-04-09: qty 2

## 2021-04-09 MED ORDER — MIDAZOLAM HCL 2 MG/2ML IJ SOLN
INTRAMUSCULAR | Status: AC
  Filled 2021-04-09: qty 2

## 2021-04-09 MED ORDER — FENTANYL CITRATE (PF) 100 MCG/2ML IJ SOLN
INTRAMUSCULAR | Status: AC | PRN
  Administered 2021-04-09: 50 ug via INTRAVENOUS

## 2021-04-09 MED ORDER — MIDAZOLAM HCL 2 MG/2ML IJ SOLN
INTRAMUSCULAR | Status: AC | PRN
  Administered 2021-04-09: 1 mg via INTRAVENOUS

## 2021-04-09 NOTE — Discharge Instructions (Signed)
Please call Interventional Radiology clinic [phone] with any questions or concerns.  You may remove your dressing and shower tomorrow.   Implanted Port Removal, Care After This sheet gives you information about how to care for yourself after your procedure. Your health care provider may also give you more specific instructions. If you have problems or questions, contact your health care provider. What can I expect after the procedure? After the procedure, it is common to have: Soreness or pain near your incision. Some swelling or bruising near your incision. Follow these instructions at home: Medicines Take over-the-counter and prescription medicines only as told by your health care provider. If you were prescribed an antibiotic medicine, take it as told by your health care provider. Do not stop taking the antibiotic even if you start to feel better. Bathing Do not take baths, swim, or use a hot tub until your health care provider approves. Ask your health care provider if you can take showers. You may only be allowed to take sponge baths. Incision care Follow instructions from your health care provider about how to take care of your incision. Make sure you: Wash your hands with soap and water before you change your bandage (dressing). If soap and water are not available, use hand sanitizer. Change your dressing as told by your health care provider. Keep your dressing dry. Leave stitches (sutures), skin glue, or adhesive strips in place. These skin closures may need to stay in place for 2 weeks or longer. If adhesive strip edges start to loosen and curl up, you may trim the loose edges. Do not remove adhesive strips completely unless your health care provider tells you to do that. Check your incision area every day for signs of infection. Check for: More redness, swelling, or pain. More fluid or blood. Warmth. Pus or a bad smell.    Driving Do not drive for 24 hours if you were  given a medicine to help you relax (sedative) during your procedure. If you did not receive a sedative, ask your health care provider when it is safe to drive.    Activity Return to your normal activities as told by your health care provider. Ask your health care provider what activities are safe for you. Do not lift anything that is heavier than 10 lb (4.5 kg), or the limit that you are told, until your health care provider says that it is safe. Do not do activities that involve lifting your arms over your head. General instructions Do not use any products that contain nicotine or tobacco, such as cigarettes and e-cigarettes. These can delay healing. If you need help quitting, ask your health care provider. Keep all follow-up visits as told by your health care provider. This is important. Contact a health care provider if: You have more redness, swelling, or pain around your incision. You have more fluid or blood coming from your incision. Your incision feels warm to the touch. You have pus or a bad smell coming from your incision. You have pain that is not relieved by your pain medicine. Get help right away if you have: A fever or chills. Chest pain. Difficulty breathing. Summary After the procedure, it is common to have pain, soreness, swelling, or bruising near your incision. If you were prescribed an antibiotic medicine, take it as told by your health care provider. Do not stop taking the antibiotic even if you start to feel better. Do not drive for 24 hours if you were given a sedative during   your procedure. Return to your normal activities as told by your health care provider. Ask your health care provider what activities are safe for you. This information is not intended to replace advice given to you by your health care provider. Make sure you discuss any questions you have with your health care provider. Document Revised: 02/19/2017 Document Reviewed: 02/19/2017 Elsevier Patient  Education  2021 [employer]   Moderate Conscious Sedation, Adult, Care After This sheet gives you information about how to care for yourself after your procedure. Your health care provider may also give you more specific instructions. If you have problems or questions, contact your health care provider. What can I expect after the procedure? After the procedure, it is common to have: Sleepiness for several hours. Impaired judgment for several hours. Difficulty with balance. Vomiting if you eat too soon. Follow these instructions at home: For the time period you were told by your health care provider: Rest. Do not participate in activities where you could fall or become injured. Do not drive or use machinery. Do not drink alcohol. Do not take sleeping pills or medicines that cause drowsiness. Do not make important decisions or sign legal documents. Do not take care of children on your own.        Eating and drinking Follow the diet recommended by your health care provider. Drink enough fluid to keep your urine pale yellow. If you vomit: Drink water, juice, or soup when you can drink without vomiting. Make sure you have little or no nausea before eating solid foods.    General instructions Take over-the-counter and prescription medicines only as told by your health care provider. Have a responsible adult stay with you for the time you are told. It is important to have someone help care for you until you are awake and alert. Do not smoke. Keep all follow-up visits as told by your health care provider. This is important. Contact a health care provider if: You are still sleepy or having trouble with balance after 24 hours. You feel light-headed. You keep feeling nauseous or you keep vomiting. You develop a rash. You have a fever. You have redness or swelling around the IV site. Get help right away if: You have trouble breathing. You have new-onset confusion at  home. Summary After the procedure, it is common to feel sleepy, have impaired judgment, or feel nauseous if you eat too soon. Rest after you get home. Know the things you should not do after the procedure. Follow the diet recommended by your health care provider and drink enough fluid to keep your urine pale yellow. Get help right away if you have trouble breathing or new-onset confusion at home. This information is not intended to replace advice given to you by your health care provider. Make sure you discuss any questions you have with your health care provider. Document Revised: 05/06/2019 Document Reviewed: 12/02/2018 Elsevier Patient Education  2021 [employer] Inc.  

## 2021-04-09 NOTE — H&P (Signed)
Chief Complaint: Patient was seen in consultation today for tunneled catheter with port removal at the request of Maurice Fox  Referring Physician(s): Maurice Fox  Supervising Physician: Maurice Fox  Patient Status: [facility] - Out-pt  History of Present Illness: Maurice Fox is a 71 y.o. male w/ PMH of HLD, OSA on CPAP, DM II and T-cell lymphoma. Pt has completed chemotherapy and in complete remission of T-cell lymphoma. Pt referred by Dr. Jeanie Fox for removal of tunneled catheter with port as it is no longer needed.   Past Medical History:  Diagnosis Date   Arthritis    Hyperlipidemia    Hypertension    followed by pcp  (11-23-2019  per pt had stress test greater than 20 yrs ago, told ok)   Nocturia    OSA on CPAP    per pt uses nightly   Phimosis    Type 2 diabetes mellitus (HCC)    followed by pcp   (11-23-2019 per pt does not check blood sugar at home)   Wears glasses     Past Surgical History:  Procedure Laterality Date   APPENDECTOMY  child   CHOLECYSTECTOMY N/A 07/09/2020   Procedure: LAPAROSCOPIC CHOLECYSTECTOMY WITH INTRAOPERATIVE CHOLANGIOGRAM,;  Surgeon: [name], MD;  Location: [name];  Service: General;  Laterality: N/A;   CIRCUMCISION N/A 11/29/2019   Procedure: CIRCUMCISION ADULT;  Surgeon: [name], MD;  [name];  Service: Urology;  Laterality: N/A;   IR IMAGING GUIDED PORT INSERTION  08/06/2020   LIVER BIOPSY N/A 07/09/2020   Procedure: LAP LIVER BIOPSY;  Surgeon: [name], MD;  Location: [facility];  Service: General;  Laterality: N/A;   ROTATOR CUFF REPAIR Left chld   SUPRA-UMBILICAL HERNIA N/A 07/09/2020   Procedure: PRIMARY REPAIR SUPRA-UMBILICAL HERNIA;  Surgeon: [name], MD;  Location: [facility];  Service: General;  Laterality: N/A;    Allergies: Patient has no known allergies.  Medications: Prior to Admission medications   Medication Sig Start Date End Date Taking? Authorizing Provider   amLODipine (NORVASC) 5 MG tablet 1 tablet 10/13/19   [provider]  aspirin 81 MG EC tablet 1 tablet    [provider]  atorvastatin (LIPITOR) 10 MG tablet Take 1 tablet by mouth daily. 10/13/19   [provider]  azelastine (ASTELIN) 0.1 % nasal spray 1 puff in each nostril    [provider]  diclofenac Sodium (VOLTAREN) 1 % GEL Apply 2 g topically 4 (four) times daily as needed for pain. 03/28/20   [provider]  Empagliflozin-metFORMIN HCl ER 25-1000 MG TB24 Take 1 tablet by mouth at bedtime.    [provider]  fluticasone (FLONASE) 50 MCG/ACT nasal spray Place 2 sprays into both nostrils in the morning and at bedtime.    [provider]  lidocaine-prilocaine (EMLA) cream Apply 1 application topically as needed. 08/15/20   Jaci Standard, MD  methocarbamol (ROBAXIN) 500 MG tablet Take 1 tablet (500 mg total) by mouth every 8 (eight) hours as needed for muscle spasms. 08/02/20   Sabas Sous, MD  Multiple Vitamins-Minerals (CENTRUM SILVER PO) Take 1 tablet by mouth at bedtime.    [provider]  ondansetron (ZOFRAN) 8 MG tablet Take 1 tablet (8 mg total) by mouth every 8 (eight) hours as needed for nausea or vomiting. Patient not taking: Reported on 03/28/2021 08/15/20   Jaci Standard, MD  predniSONE (DELTASONE) 20 MG tablet Take 3  tablets (60 mg total) by mouth daily with breakfast. 08/15/20   Jaci Standard, MD  prochlorperazine (COMPAZINE) 10 MG tablet Take 1 tablet (10 mg total) by mouth every 6 (six) hours as needed for nausea or vomiting. Patient not taking: Reported on 03/28/2021 08/15/20   Jaci Standard, MD  Propylene Glycol (SYSTANE BALANCE OP) Place 1 drop into both eyes 2 (two) times daily.    [provider]  sildenafil (VIAGRA) 100 MG tablet Take 100 mg by mouth daily as needed (ED).    [provider]  tamsulosin (FLOMAX) 0.4 MG CAPS capsule Take 0.4 mg by mouth at bedtime.     [provider]     Family History  Problem Relation Age of Onset   Hypotension Mother    Diabetes Mellitus II Father    Prostate cancer Brother    Prostate cancer Brother     Social History   Socioeconomic History   Marital status: Divorced    Spouse name: Not on file   Number of children: Not on file   Years of education: Not on file   Highest education level: Not on file  Occupational History   Not on file  Tobacco Use   Smoking status: Every Day    Packs/day: 1.00    Years: 49.00    Pack years: 49.00    Types: Cigars, Cigarettes   Smokeless tobacco: Never  Vaping Use   Vaping Use: Never used  Substance and Sexual Activity   Alcohol use: Not Currently   Drug use: Never   Sexual activity: Not on file  Other Topics Concern   Not on file  Social History Narrative   Not on file   Social Determinants of Health   Financial Resource Strain: Not on file  Food Insecurity: Not on file  Transportation Needs: Not on file  Physical Activity: Not on file  Stress: Not on file  Social Connections: Not on file    Review of Systems: A 12 point ROS discussed and pertinent positives are indicated in the HPI above.  All other systems are negative.  Review of Systems  Constitutional:  Negative for fever.  Respiratory:  Negative for shortness of breath.   Cardiovascular:  Negative for chest pain.  Gastrointestinal:  Positive for abdominal pain. Negative for blood in stool, nausea and vomiting.  Genitourinary:  Negative for hematuria.  Neurological:  Negative for headaches.   Vital Signs: There were no vitals taken for this visit.  Physical Exam Constitutional:      Appearance: Normal appearance. He is not ill-appearing.  HENT:     Head: Normocephalic and atraumatic.  Eyes:     Extraocular Movements: Extraocular movements intact.     Pupils: Pupils are equal, round, and reactive to light.  Cardiovascular:     Rate and Rhythm: Normal rate and regular rhythm.      Pulses: Normal pulses.     Heart sounds: Normal heart sounds.  Pulmonary:     Effort: Pulmonary effort is normal. No respiratory distress.  Abdominal:     General: Bowel sounds are normal.     Palpations: Abdomen is soft.  Skin:    General: Skin is warm and dry.  Neurological:     Mental Status: He is alert and oriented to person, place, and time.  Psychiatric:        Mood and Affect: Mood normal.        Behavior: Behavior normal.  Thought Content: Thought content normal.        Judgment: Judgment normal.    Imaging: No results found.  Labs:  CBC: Recent Labs    11/29/20 1018 01/10/21 1024 02/11/21 0830 03/28/21 1435  WBC 7.8 5.3 4.4 4.9  HGB 11.6* 13.4 13.4 14.6  HCT 35.3* 39.4 40.1 42.7  PLT 267 187 165 162    COAGS: No results for input(s): INR, APTT in the last 8760 hours.  BMP: Recent Labs    11/08/20 1005 11/29/20 1018 01/10/21 1024 03/28/21 1435  NA 141 141 141 138  K 3.8 4.2 3.7 3.6  CL 107 108 107 105  CO2 24 24 27 26   GLUCOSE 176* 141* 126* 231*  BUN 26* 23 23 25*  CALCIUM 9.5 9.1 9.5 9.5  CREATININE 0.87 0.79 0.88 0.93  GFRNONAA >60 >60 >60 >60    LIVER FUNCTION TESTS: Recent Labs    11/08/20 1005 11/29/20 1018 01/10/21 1024 03/28/21 1435  BILITOT 0.3 0.4 0.4 0.3  AST 17 19 18 18   ALT 15 16 13 23   ALKPHOS 141* 140* 122 116  PROT 6.9 6.9 6.9 7.0  ALBUMIN 3.8 3.6 4.0 4.2    TUMOR MARKERS: No results for input(s): AFPTM, CEA, CA199, CHROMGRNA in the last 8760 hours.  Assessment and Plan: History of HLD, OSA on CPAP, DM II and T-cell lymphoma. Pt has completed chemotherapy and in complete remission of T-cell lymphoma. Pt referred by Dr. Jeanie Fox for removal of tunneled catheter with port as it is no longer needed.   Pt is A&O. He is in no distress.  Pt states he is NPO per order.  No labs required today.   Risks and benefits of tunneled catheter with port removal was discussed with the patient including, but not  limited to bleeding, infection, pneumothorax and need for additional procedures.  All of the patient's questions were answered, patient is agreeable to proceed. Consent signed and in chart.    Thank you for this interesting consult.  I greatly enjoyed meeting MILFRED KRAMMES and look forward to participating in their care.  A copy of this report was sent to the requesting provider on this date.  Electronically Signed: Shon Hough, NP 04/09/2021, 12:40 PM   I spent a total of  20 minutes in face to face in clinical consultation, greater than 50% of which was counseling/coordinating care for tunneled catheter with port removal.

## 2021-04-09 NOTE — Procedures (Signed)
Interventional Radiology Procedure Note  Procedure: Removal of a right IJ approach single lumen PowerPort.    Complications: None Recommendations:  - Ok to shower tomorrow - Do not submerge for 7 days - Routine wound care   Signed,  Khali Perella S. Cherilynn Schomburg, DO   

## 2021-04-16 ENCOUNTER — Encounter: Payer: Self-pay | Admitting: Hematology and Oncology

## 2021-04-22 ENCOUNTER — Encounter: Payer: Self-pay | Admitting: Hematology and Oncology

## 2021-04-24 ENCOUNTER — Encounter: Payer: Self-pay | Admitting: Hematology and Oncology

## 2021-04-25 ENCOUNTER — Telehealth: Payer: Self-pay

## 2021-04-25 NOTE — Telephone Encounter (Signed)
Notified Patient of completion of Work Release Form. Fax confirmation received. Copy of form mailed to Patient as requested. ?

## 2021-05-08 ENCOUNTER — Encounter: Payer: Self-pay | Admitting: Hematology and Oncology

## 2021-05-29 DIAGNOSIS — E1165 Type 2 diabetes mellitus with hyperglycemia: Secondary | ICD-10-CM | POA: Diagnosis not present

## 2021-05-29 DIAGNOSIS — I10 Essential (primary) hypertension: Secondary | ICD-10-CM | POA: Diagnosis not present

## 2021-05-29 DIAGNOSIS — E782 Mixed hyperlipidemia: Secondary | ICD-10-CM | POA: Diagnosis not present

## 2021-06-03 DIAGNOSIS — E1121 Type 2 diabetes mellitus with diabetic nephropathy: Secondary | ICD-10-CM | POA: Diagnosis not present

## 2021-06-03 DIAGNOSIS — Z23 Encounter for immunization: Secondary | ICD-10-CM | POA: Diagnosis not present

## 2021-06-03 DIAGNOSIS — E782 Mixed hyperlipidemia: Secondary | ICD-10-CM | POA: Diagnosis not present

## 2021-06-03 DIAGNOSIS — I10 Essential (primary) hypertension: Secondary | ICD-10-CM | POA: Diagnosis not present

## 2021-06-03 DIAGNOSIS — E1165 Type 2 diabetes mellitus with hyperglycemia: Secondary | ICD-10-CM | POA: Diagnosis not present

## 2021-06-03 DIAGNOSIS — R748 Abnormal levels of other serum enzymes: Secondary | ICD-10-CM | POA: Diagnosis not present

## 2021-06-03 DIAGNOSIS — N529 Male erectile dysfunction, unspecified: Secondary | ICD-10-CM | POA: Diagnosis not present

## 2021-06-14 ENCOUNTER — Encounter: Payer: Self-pay | Admitting: Hematology and Oncology

## 2021-06-20 ENCOUNTER — Encounter: Payer: Self-pay | Admitting: Hematology and Oncology

## 2021-06-21 ENCOUNTER — Encounter (HOSPITAL_COMMUNITY): Payer: Self-pay

## 2021-06-21 ENCOUNTER — Ambulatory Visit (HOSPITAL_COMMUNITY)
Admission: RE | Admit: 2021-06-21 | Discharge: 2021-06-21 | Disposition: A | Discharge status: Home / Self Care | Payer: Medicare PPO | Source: Ambulatory Visit | Attending: Hematology and Oncology | Admitting: Hematology and Oncology

## 2021-06-21 DIAGNOSIS — Z8572 Personal history of non-Hodgkin lymphomas: Secondary | ICD-10-CM | POA: Diagnosis not present

## 2021-06-21 DIAGNOSIS — Z9221 Personal history of antineoplastic chemotherapy: Secondary | ICD-10-CM | POA: Insufficient documentation

## 2021-06-21 DIAGNOSIS — Z9049 Acquired absence of other specified parts of digestive tract: Secondary | ICD-10-CM | POA: Diagnosis not present

## 2021-06-21 DIAGNOSIS — J984 Other disorders of lung: Secondary | ICD-10-CM | POA: Diagnosis not present

## 2021-06-21 DIAGNOSIS — I2510 Atherosclerotic heart disease of native coronary artery without angina pectoris: Secondary | ICD-10-CM | POA: Diagnosis not present

## 2021-06-21 DIAGNOSIS — I700 Atherosclerosis of aorta: Secondary | ICD-10-CM | POA: Diagnosis not present

## 2021-06-21 DIAGNOSIS — E1165 Type 2 diabetes mellitus with hyperglycemia: Secondary | ICD-10-CM

## 2021-06-21 DIAGNOSIS — C8442 Peripheral T-cell lymphoma, not classified, intrathoracic lymph nodes: Secondary | ICD-10-CM

## 2021-06-21 DIAGNOSIS — I723 Aneurysm of iliac artery: Secondary | ICD-10-CM | POA: Diagnosis not present

## 2021-06-21 LAB — POCT I-STAT CREATININE: Creatinine, Ser: 1 mg/dL (ref 0.61–1.24)

## 2021-06-21 MED ORDER — SODIUM CHLORIDE (PF) 0.9 % IJ SOLN
INTRAMUSCULAR | Status: AC
  Filled 2021-06-21: qty 50

## 2021-06-21 MED ORDER — IOHEXOL 300 MG/ML  SOLN
100.0000 mL | Freq: Once | INTRAMUSCULAR | Status: AC | PRN
  Administered 2021-06-21: 100 mL via INTRAVENOUS

## 2021-06-28 ENCOUNTER — Other Ambulatory Visit: Payer: Self-pay

## 2021-06-28 ENCOUNTER — Inpatient Hospital Stay (HOSPITAL_BASED_OUTPATIENT_CLINIC_OR_DEPARTMENT_OTHER): Payer: Medicare PPO | Admitting: Hematology and Oncology

## 2021-06-28 ENCOUNTER — Inpatient Hospital Stay: Payer: Medicare PPO | Attending: Hematology and Oncology

## 2021-06-28 VITALS — BP 155/78 | HR 100 | Temp 98.6°F | Resp 18 | Ht 70.0 in | Wt 232.5 lb

## 2021-06-28 DIAGNOSIS — Z8042 Family history of malignant neoplasm of prostate: Secondary | ICD-10-CM | POA: Insufficient documentation

## 2021-06-28 DIAGNOSIS — G629 Polyneuropathy, unspecified: Secondary | ICD-10-CM | POA: Diagnosis not present

## 2021-06-28 DIAGNOSIS — Z7952 Long term (current) use of systemic steroids: Secondary | ICD-10-CM | POA: Insufficient documentation

## 2021-06-28 DIAGNOSIS — G8929 Other chronic pain: Secondary | ICD-10-CM | POA: Diagnosis not present

## 2021-06-28 DIAGNOSIS — C8442 Peripheral T-cell lymphoma, not classified, intrathoracic lymph nodes: Secondary | ICD-10-CM

## 2021-06-28 DIAGNOSIS — Z7984 Long term (current) use of oral hypoglycemic drugs: Secondary | ICD-10-CM | POA: Insufficient documentation

## 2021-06-28 DIAGNOSIS — Z79899 Other long term (current) drug therapy: Secondary | ICD-10-CM | POA: Insufficient documentation

## 2021-06-28 DIAGNOSIS — C8499 Mature T/NK-cell lymphomas, unspecified, extranodal and solid organ sites: Secondary | ICD-10-CM | POA: Diagnosis not present

## 2021-06-28 DIAGNOSIS — F17210 Nicotine dependence, cigarettes, uncomplicated: Secondary | ICD-10-CM | POA: Diagnosis not present

## 2021-06-28 LAB — CBC WITH DIFFERENTIAL (CANCER CENTER ONLY)
Abs Immature Granulocytes: 0 10*3/uL (ref 0.00–0.07)
Basophils Absolute: 0 10*3/uL (ref 0.0–0.1)
Basophils Relative: 1 %
Eosinophils Absolute: 0.2 10*3/uL (ref 0.0–0.5)
Eosinophils Relative: 4 %
HCT: 45.7 % (ref 39.0–52.0)
Hemoglobin: 15.6 g/dL (ref 13.0–17.0)
Immature Granulocytes: 0 %
Lymphocytes Relative: 34 %
Lymphs Abs: 1.6 10*3/uL (ref 0.7–4.0)
MCH: 29.8 pg (ref 26.0–34.0)
MCHC: 34.1 g/dL (ref 30.0–36.0)
MCV: 87.4 fL (ref 80.0–100.0)
Monocytes Absolute: 0.4 10*3/uL (ref 0.1–1.0)
Monocytes Relative: 9 %
Neutro Abs: 2.5 10*3/uL (ref 1.7–7.7)
Neutrophils Relative %: 52 %
Platelet Count: 174 10*3/uL (ref 150–400)
RBC: 5.23 MIL/uL (ref 4.22–5.81)
RDW: 15.3 % (ref 11.5–15.5)
WBC Count: 4.8 10*3/uL (ref 4.0–10.5)
nRBC: 0 % (ref 0.0–0.2)

## 2021-06-28 LAB — CMP (CANCER CENTER ONLY)
ALT: 16 U/L (ref 0–44)
AST: 16 U/L (ref 15–41)
Albumin: 4.3 g/dL (ref 3.5–5.0)
Alkaline Phosphatase: 111 U/L (ref 38–126)
Anion gap: 5 (ref 5–15)
BUN: 17 mg/dL (ref 8–23)
CO2: 26 mmol/L (ref 22–32)
Calcium: 9.8 mg/dL (ref 8.9–10.3)
Chloride: 106 mmol/L (ref 98–111)
Creatinine: 0.97 mg/dL (ref 0.61–1.24)
GFR, Estimated: >60 mL/min (ref >60)
Glucose, Bld: 310 mg/dL — ABNORMAL HIGH (ref 70–99)
Potassium: 3.7 mmol/L (ref 3.5–5.1)
Sodium: 137 mmol/L (ref 135–145)
Total Bilirubin: 0.5 mg/dL (ref 0.3–1.2)
Total Protein: 7.3 g/dL (ref 6.5–8.1)

## 2021-06-28 LAB — LACTATE DEHYDROGENASE: LDH: 159 U/L (ref 98–192)

## 2021-06-28 NOTE — Progress Notes (Unsigned)
[facility] [phone]   [phone]  PROGRESS NOTE  Patient Care Team: Fanny Bien, MD as PCP - General (Family Medicine)  Hematological/Oncological History # CD30-Positive T-cell Lymphoma, Stage III/IV 07/09/2020: patient underwent cholecystectomy. Liver wedge biopsy during the procedure revealed involvement of a CD30-positive T-cell lymphoproliferative disorder 07/25/2020: establish care with Dr. Lorenso Courier  08/08/2020: PET CT scan showed innumerable hypermetabolic lesions (primarily Deauville 4) in the liver and skeleton. Left neck adenopathy including a dominant level IB lymph node which is Deauville 5. 08/15/2020: Cycle 1 Day 1 of BV-CHP  09/04/2020: Cycle 2 Day 1 of BV-CHP 09/27/2020:  Cycle 3 Day 1 of BV-CHP 10/18/2020: Cycle 4 Day 1 of BV-CHP 11/08/2020: Cycle 5 Day 1 of BV-CHP 11/29/2020: Cycle 6 Day 1 of BV-CHP 12/26/2020: PET CT scan showed further improvement compared to the 11/05/2020 exam, with mild reduction in activity in the extensive scattered mixed density skeletal lesions (although still Deauville 4) and no substantial hypermetabolic adenopathy currently identified 02/11/2021: Bone marrow biopsy performed due to concern for FDG avidity of bone marrow.  No evidence of residual disease noted on bone marrow biopsy.  Interval History:  Maurice Fox 71 y.o. male with medical history significant for T cell lymphoma (Stage III/IV) presents for a follow up visit. The patient's last visit was on 03/28/2021.  In the interim since that time he has undergone a repeat CT scan which showed no evidence of recurrent/residual disease.   On exam today [name] ports he had no big changes in his health in the interim since her last visit.  He does continue to have some episodes of fatigue.  He notes that he does have also some chronic lower back pain that he is dealing with.  He notes that he is currently working quite hard and that the strenuous work may  be causing muscle pain in his back.  His weight has been good and steadily increasing.  He is up to 232 pounds up from 216 on January 10, 2021.  He reports that he is not having any bumps or lumps in his neck and that he "checks every day".  He reports that his energy levels are okay and currently ranks them as a 6 or 7 out of 10.  He does continue to have some neuropathy which is stable from prior.  He not noticed any new lymphadenopathy or had any signs or symptoms of recurrence.  Patient denies any fevers, chills, shortness of breath, chest pain or constipation. He has no other complaints.  A full 10 point ROS is listed below.  MEDICAL HISTORY:  Past Medical History:  Diagnosis Date   Arthritis    Hyperlipidemia    Hypertension    followed by pcp  (11-23-2019  per pt had stress test greater than 20 yrs ago, told ok)   Nocturia    OSA on CPAP    per pt uses nightly   Phimosis    Type 2 diabetes mellitus ([place])    followed by pcp   (11-23-2019 per pt does not check blood sugar at home)   Wears glasses     SURGICAL HISTORY: Past Surgical History:  Procedure Laterality Date   APPENDECTOMY  child   CHOLECYSTECTOMY N/A 07/09/2020   Procedure: LAPAROSCOPIC CHOLECYSTECTOMY WITH INTRAOPERATIVE CHOLANGIOGRAM,;  Surgeon: Greer Pickerel, MD;  Location: [facility];  Service: General;  Laterality: N/A;   CIRCUMCISION N/A 11/29/2019   Procedure: CIRCUMCISION ADULT;  Surgeon: [name], MD;  Location:  [facility];  Service: Urology;  Laterality: N/A;   IR IMAGING GUIDED PORT INSERTION  08/06/2020   IR REMOVAL TUN ACCESS W/ PORT W/O FL MOD SED  04/09/2021   LIVER BIOPSY N/A 07/09/2020   Procedure: LAP LIVER BIOPSY;  Surgeon: Greer Pickerel, MD;  Location: [facility];  Service: General;  Laterality: N/A;   ROTATOR CUFF REPAIR Left chld   SUPRA-UMBILICAL HERNIA N/A 03/05/863   Procedure: PRIMARY REPAIR SUPRA-UMBILICAL HERNIA;  Surgeon: Greer Pickerel, MD;  Location: [facility];  Service: General;   Laterality: N/A;    SOCIAL HISTORY: Social History   Socioeconomic History   Marital status: Divorced    Spouse name: Not on file   Number of children: Not on file   Years of education: Not on file   Highest education level: Not on file  Occupational History   Not on file  Tobacco Use   Smoking status: Every Day    Packs/day: 1.00    Years: 49.00    Total pack years: 49.00    Types: Cigars, Cigarettes   Smokeless tobacco: Never  Vaping Use   Vaping Use: Never used  Substance and Sexual Activity   Alcohol use: Not Currently   Drug use: Never   Sexual activity: Not on file  Other Topics Concern   Not on file  Social History Narrative   Not on file   Social Determinants of Health   Financial Resource Strain: Not on file  Food Insecurity: Not on file  Transportation Needs: Not on file  Physical Activity: Not on file  Stress: Not on file  Social Connections: Not on file  Intimate Partner Violence: Not on file    FAMILY HISTORY: Family History  Problem Relation Age of Onset   Hypotension Mother    Diabetes Mellitus II Father    Prostate cancer Brother    Prostate cancer Brother     ALLERGIES:  has No Known Allergies.  MEDICATIONS:  Current Outpatient Medications  Medication Sig Dispense Refill   amLODipine (NORVASC) 5 MG tablet 1 tablet     aspirin 81 MG EC tablet 1 tablet     atorvastatin (LIPITOR) 10 MG tablet Take 1 tablet by mouth daily.     azelastine (ASTELIN) 0.1 % nasal spray 1 puff in each nostril     diclofenac Sodium (VOLTAREN) 1 % GEL Apply 2 g topically 4 (four) times daily as needed for pain.     Empagliflozin-metFORMIN HCl ER 25-1000 MG TB24 Take 1 tablet by mouth at bedtime.     fluticasone (FLONASE) 50 MCG/ACT nasal spray Place 2 sprays into both nostrils in the morning and at bedtime.     lidocaine-prilocaine (EMLA) cream Apply 1 application topically as needed. 30 g 0   methocarbamol (ROBAXIN) 500 MG tablet Take 1 tablet (500 mg total) by  mouth every 8 (eight) hours as needed for muscle spasms. 30 tablet 0   Multiple Vitamins-Minerals (CENTRUM SILVER PO) Take 1 tablet by mouth at bedtime.     ondansetron (ZOFRAN) 8 MG tablet Take 1 tablet (8 mg total) by mouth every 8 (eight) hours as needed for nausea or vomiting. (Patient not taking: Reported on 03/28/2021) 30 tablet 0   predniSONE (DELTASONE) 20 MG tablet Take 3 tablets (60 mg total) by mouth daily with breakfast. 15 tablet 5   prochlorperazine (COMPAZINE) 10 MG tablet Take 1 tablet (10 mg total) by mouth every 6 (six) hours as needed for nausea or vomiting. (Patient not taking:  Reported on 03/28/2021) 30 tablet 0   Propylene Glycol (SYSTANE BALANCE OP) Place 1 drop into both eyes 2 (two) times daily.     sildenafil (VIAGRA) 100 MG tablet Take 100 mg by mouth daily as needed (ED).     tamsulosin (FLOMAX) 0.4 MG CAPS capsule Take 0.4 mg by mouth at bedtime.     No current facility-administered medications for this visit.    REVIEW OF SYSTEMS:   Constitutional: ( - ) fevers, ( - )  chills , ( - ) night sweats Eyes: ( - ) blurriness of vision, ( - ) double vision, ( - ) watery eyes Ears, nose, mouth, throat, and face: ( - ) mucositis, ( - ) sore throat Respiratory: ( - ) cough, ( - ) dyspnea, ( - ) wheezes Cardiovascular: ( - ) palpitation, ( - ) chest discomfort, ( - ) lower extremity swelling Gastrointestinal:  ( - ) nausea, ( - ) heartburn, ( - ) change in bowel habits Skin: ( - ) abnormal skin rashes Lymphatics: ( - ) new lymphadenopathy, ( - ) easy bruising Neurological: ( - ) numbness, ( - ) tingling, ( - ) new weaknesses Behavioral/Psych: ( - ) mood change, ( - ) new changes  All other systems were reviewed with the patient and are negative.  PHYSICAL EXAMINATION: ECOG PERFORMANCE STATUS: 1 - Symptomatic but completely ambulatory  Vitals:   06/28/21 1420  BP: (!) 155/78  Pulse: 100  Resp: 18  Temp: 98.6 F (37 C)  SpO2: 98%    Filed Weights   06/28/21 1420   Weight: 232 lb 8 oz (105.5 kg)    GENERAL: Well-appearing elderly African-American male, alert, no distress and comfortable SKIN: skin color, texture, turgor are normal, no rashes or significant lesions EYES: conjunctiva are pink and non-injected, sclera clear NECK: supple, non-tender LYMPH: resolved left submandibular lymph node, otherwise no palpable lymphadenopathy in the cervical, axillary or inguinal LUNGS: clear to auscultation and percussion with normal breathing effort HEART: regular rate & rhythm and no murmurs and no lower extremity edema PSYCH: alert & oriented x 3, fluent speech NEURO: no focal motor/sensory deficits  LABORATORY DATA:  I have reviewed the data as listed    Latest Ref Rng & Units 06/28/2021    1:40 PM 03/28/2021    2:35 PM 02/11/2021    8:30 AM  CBC  WBC 4.0 - 10.5 K/uL 4.8  4.9  4.4   Hemoglobin 13.0 - 17.0 g/dL 15.6  14.6  13.4   Hematocrit 39.0 - 52.0 % 45.7  42.7  40.1   Platelets 150 - 400 K/uL 174  162  165        Latest Ref Rng & Units 06/28/2021    1:40 PM 06/21/2021   10:48 AM 03/28/2021    2:35 PM  CMP  Glucose 70 - 99 mg/dL 310   231   BUN 8 - 23 mg/dL 17   25   Creatinine 0.61 - 1.24 mg/dL 0.97  1.00  0.93   Sodium 135 - 145 mmol/L 137   138   Potassium 3.5 - 5.1 mmol/L 3.7   3.6   Chloride 98 - 111 mmol/L 106   105   CO2 22 - 32 mmol/L 26   26   Calcium 8.9 - 10.3 mg/dL 9.8   9.5   Total Protein 6.5 - 8.1 g/dL 7.3   7.0   Total Bilirubin 0.3 - 1.2 mg/dL 0.5   0.3  Alkaline Phos 38 - 126 U/L 111   116   AST 15 - 41 U/L 16   18   ALT 0 - 44 U/L 16   23    RADIOGRAPHIC STUDIES: No images were reviewed during this visit.   ASSESSMENT & PLAN Maurice Fox 70 y.o. male with medical history significant for T cell lymphoma (Stage III/IV) presents for a follow up visit.  After review of the labs, review of the records, and discussion with the patient the patients findings are most consistent with a peripheral CD30 positive T-cell  lymphoma.  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  The patient has been staged with a PET CT scan which showed clear involvement of the skeleton and liver, most consistent with stage IV disease.  Given these findings we will plan for 6 cycles of BV CHP chemotherapy.  The regimen of BV CHP consists of brentuximab 1.8 mg/kg IV on day 1, cyclophosphamide 750 mg per metered squared on day 1, doxorubicin 50 mg per metered squared on day 1, and prednisone 60 mg p.o. day 1 through 5.  This is to be continued every 21 days with a plan for up to 6 cycles.  After the third or fourth cycle we will consider restaging PET CT scan in order to evaluate for response.  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 --TTE performed 07/08/2020, shows EF of 50-55% --negative HIV, Hep B and C serologies. --GCSF support on Day 3 of each cycle.  -LDH, Liver enzymes and Alk Phos are improving. Patient will proceed with treatment today.  --PET CT scan performed on 11/05/2020, findings show Deauville 2 disease with some Deauville 4 noted in the skeleton. Post treatment PET after the last cycle showed diminished FDG avidity, but still Deauville 4.  -- Bone marrow biopsy on 02/11/2021 shows no evidence of residual disease. Plan: -- Per NCCN recommendations we will have the patient return to clinic every 3 to 6 months with imaging on a 25-monthbasis for the first 2 years --Labs at each clinic visit to include CBC, CMP, LDH --last CT scan on 06/21/2021, next due in Dec 2023.  --Return to clinic in 3 months time or sooner if he were to have any new or concerning symptoms.  #Chemotherapy Induced Anemia- resolved  --Hgb 15.6, normalized.   #Supportive Care -- port removed 04/09/2021  No orders of the defined types were  placed in this encounter.  All questions were answered. The patient knows to call the clinic with any problems, questions or concerns.  A total of more than 30 minutes were spent on this encounter with face-to-face time and non-face-to-face time, including preparing to see the patient, ordering medications, counseling the patient and coordination of care as outlined above.   JLedell Peoples MD Department of Hematology/Oncology [facility]Phone: [phone]: [phone]: [email or web address]  07/02/2021 4:34 PM

## 2021-07-01 ENCOUNTER — Telehealth: Payer: Self-pay | Admitting: Hematology and Oncology

## 2021-07-01 NOTE — Telephone Encounter (Signed)
Per 6/9 los called and spoke to pt about appointment  pt confirmed appointment

## 2021-07-02 ENCOUNTER — Encounter: Payer: Self-pay | Admitting: Hematology and Oncology

## 2021-07-05 ENCOUNTER — Telehealth: Payer: Self-pay | Admitting: *Deleted

## 2021-07-05 DIAGNOSIS — E1165 Type 2 diabetes mellitus with hyperglycemia: Secondary | ICD-10-CM | POA: Diagnosis not present

## 2021-07-05 DIAGNOSIS — E669 Obesity, unspecified: Secondary | ICD-10-CM | POA: Diagnosis not present

## 2021-07-05 DIAGNOSIS — I10 Essential (primary) hypertension: Secondary | ICD-10-CM | POA: Diagnosis not present

## 2021-07-05 DIAGNOSIS — E8881 Metabolic syndrome: Secondary | ICD-10-CM | POA: Diagnosis not present

## 2021-07-05 NOTE — Telephone Encounter (Signed)
See prior note addendum.

## 2021-07-05 NOTE — Telephone Encounter (Signed)
[employer] Disability paperwork completed at this time.  Yellow form folder to designated mail bin for collaborative pick up.  Awaiting provider review and signature.

## 2021-07-05 NOTE — Telephone Encounter (Signed)
"  [employer] [phone]).  Question regarding disability physician statement not yet received from Dr. Lorenso Courier.  Faxed April 6th, 10th, May 19th, June 9th, 2023 to [phone]).  Fax number above is (SW) H.I.M. Department.  This nurse checked media notes and patient files.  No physician statement disability certification form noted.  Requested to resend providing correct fax number to [facility] office [phone].    Advised 7 to 14 calendar day return time frame.  No further questions or needs.  Post call checked H.I.M. Releases, successfully located [employer] request dated dated April 29, 2021 reads Maurice Fox needs information from November 20, 2020 to present.  "Dr. Lorenso Courier please send completed form and updated medical records . . ." for this patient

## 2021-07-09 NOTE — Telephone Encounter (Signed)
This nurse used request located within (SW) H.I.M release information dated 04/29/2021 to expedite urgent request for missing information.  Form received today from provider successfully faxed to [facility] [place] with 06/28/2021 office note.   Original mailed to patient address on file. [place] [phone] No further completed form delivery actions performed or further instructions received.

## 2021-07-11 DIAGNOSIS — E1165 Type 2 diabetes mellitus with hyperglycemia: Secondary | ICD-10-CM | POA: Diagnosis not present

## 2021-08-09 DIAGNOSIS — Z6833 Body mass index (BMI) 33.0-33.9, adult: Secondary | ICD-10-CM | POA: Diagnosis not present

## 2021-08-09 DIAGNOSIS — E669 Obesity, unspecified: Secondary | ICD-10-CM | POA: Diagnosis not present

## 2021-08-09 DIAGNOSIS — I10 Essential (primary) hypertension: Secondary | ICD-10-CM | POA: Diagnosis not present

## 2021-08-09 DIAGNOSIS — E1165 Type 2 diabetes mellitus with hyperglycemia: Secondary | ICD-10-CM | POA: Diagnosis not present

## 2021-08-12 ENCOUNTER — Other Ambulatory Visit: Payer: Self-pay

## 2021-08-28 DIAGNOSIS — I10 Essential (primary) hypertension: Secondary | ICD-10-CM | POA: Diagnosis not present

## 2021-08-28 DIAGNOSIS — E663 Overweight: Secondary | ICD-10-CM | POA: Diagnosis not present

## 2021-08-28 DIAGNOSIS — E1143 Type 2 diabetes mellitus with diabetic autonomic (poly)neuropathy: Secondary | ICD-10-CM | POA: Diagnosis not present

## 2021-09-03 DIAGNOSIS — I129 Hypertensive chronic kidney disease with stage 1 through stage 4 chronic kidney disease, or unspecified chronic kidney disease: Secondary | ICD-10-CM | POA: Diagnosis not present

## 2021-09-03 DIAGNOSIS — E1122 Type 2 diabetes mellitus with diabetic chronic kidney disease: Secondary | ICD-10-CM | POA: Diagnosis not present

## 2021-09-03 DIAGNOSIS — E1143 Type 2 diabetes mellitus with diabetic autonomic (poly)neuropathy: Secondary | ICD-10-CM | POA: Diagnosis not present

## 2021-09-03 DIAGNOSIS — E1165 Type 2 diabetes mellitus with hyperglycemia: Secondary | ICD-10-CM | POA: Diagnosis not present

## 2021-09-10 DIAGNOSIS — Z23 Encounter for immunization: Secondary | ICD-10-CM | POA: Diagnosis not present

## 2021-09-10 DIAGNOSIS — E1165 Type 2 diabetes mellitus with hyperglycemia: Secondary | ICD-10-CM | POA: Diagnosis not present

## 2021-09-24 DIAGNOSIS — Z6834 Body mass index (BMI) 34.0-34.9, adult: Secondary | ICD-10-CM | POA: Diagnosis not present

## 2021-09-24 DIAGNOSIS — I10 Essential (primary) hypertension: Secondary | ICD-10-CM | POA: Diagnosis not present

## 2021-09-24 DIAGNOSIS — E6609 Other obesity due to excess calories: Secondary | ICD-10-CM | POA: Diagnosis not present

## 2021-09-24 DIAGNOSIS — E1165 Type 2 diabetes mellitus with hyperglycemia: Secondary | ICD-10-CM | POA: Diagnosis not present

## 2021-09-25 DIAGNOSIS — E1165 Type 2 diabetes mellitus with hyperglycemia: Secondary | ICD-10-CM | POA: Diagnosis not present

## 2021-10-01 DIAGNOSIS — E1165 Type 2 diabetes mellitus with hyperglycemia: Secondary | ICD-10-CM | POA: Diagnosis not present

## 2021-10-03 ENCOUNTER — Other Ambulatory Visit: Payer: Self-pay

## 2021-10-03 DIAGNOSIS — C8442 Peripheral T-cell lymphoma, not classified, intrathoracic lymph nodes: Secondary | ICD-10-CM

## 2021-10-04 ENCOUNTER — Inpatient Hospital Stay (HOSPITAL_BASED_OUTPATIENT_CLINIC_OR_DEPARTMENT_OTHER): Payer: Medicare PPO | Admitting: Hematology and Oncology

## 2021-10-04 ENCOUNTER — Inpatient Hospital Stay: Payer: Medicare PPO | Attending: Hematology and Oncology

## 2021-10-04 VITALS — BP 157/74 | HR 108 | Temp 98.6°F | Resp 17 | Wt 234.9 lb

## 2021-10-04 DIAGNOSIS — Z95828 Presence of other vascular implants and grafts: Secondary | ICD-10-CM

## 2021-10-04 DIAGNOSIS — C8442 Peripheral T-cell lymphoma, not classified, intrathoracic lymph nodes: Secondary | ICD-10-CM

## 2021-10-04 DIAGNOSIS — C8499 Mature T/NK-cell lymphomas, unspecified, extranodal and solid organ sites: Secondary | ICD-10-CM | POA: Diagnosis not present

## 2021-10-04 DIAGNOSIS — F17210 Nicotine dependence, cigarettes, uncomplicated: Secondary | ICD-10-CM | POA: Insufficient documentation

## 2021-10-04 DIAGNOSIS — N529 Male erectile dysfunction, unspecified: Secondary | ICD-10-CM

## 2021-10-04 DIAGNOSIS — C866 Primary cutaneous CD30-positive T-cell proliferations: Secondary | ICD-10-CM | POA: Diagnosis not present

## 2021-10-04 LAB — CBC WITH DIFFERENTIAL (CANCER CENTER ONLY)
Abs Immature Granulocytes: 0.01 10*3/uL (ref 0.00–0.07)
Basophils Absolute: 0.1 10*3/uL (ref 0.0–0.1)
Basophils Relative: 1 %
Eosinophils Absolute: 0.3 10*3/uL (ref 0.0–0.5)
Eosinophils Relative: 6 %
HCT: 46.1 % (ref 39.0–52.0)
Hemoglobin: 15.3 g/dL (ref 13.0–17.0)
Immature Granulocytes: 0 %
Lymphocytes Relative: 33 %
Lymphs Abs: 1.8 10*3/uL (ref 0.7–4.0)
MCH: 29.4 pg (ref 26.0–34.0)
MCHC: 33.2 g/dL (ref 30.0–36.0)
MCV: 88.7 fL (ref 80.0–100.0)
Monocytes Absolute: 0.6 10*3/uL (ref 0.1–1.0)
Monocytes Relative: 11 %
Neutro Abs: 2.6 10*3/uL (ref 1.7–7.7)
Neutrophils Relative %: 49 %
Platelet Count: 184 10*3/uL (ref 150–400)
RBC: 5.2 MIL/uL (ref 4.22–5.81)
RDW: 14.6 % (ref 11.5–15.5)
WBC Count: 5.3 10*3/uL (ref 4.0–10.5)
nRBC: 0 % (ref 0.0–0.2)

## 2021-10-04 LAB — CMP (CANCER CENTER ONLY)
ALT: 27 U/L (ref 0–44)
AST: 24 U/L (ref 15–41)
Albumin: 3.9 g/dL (ref 3.5–5.0)
Alkaline Phosphatase: 86 U/L (ref 38–126)
Anion gap: 7 (ref 5–15)
BUN: 23 mg/dL (ref 8–23)
CO2: 25 mmol/L (ref 22–32)
Calcium: 9.3 mg/dL (ref 8.9–10.3)
Chloride: 112 mmol/L — ABNORMAL HIGH (ref 98–111)
Creatinine: 1.36 mg/dL — ABNORMAL HIGH (ref 0.61–1.24)
GFR, Estimated: 56 mL/min — ABNORMAL LOW (ref >60)
Glucose, Bld: 175 mg/dL — ABNORMAL HIGH (ref 70–99)
Potassium: 3.4 mmol/L — ABNORMAL LOW (ref 3.5–5.1)
Sodium: 144 mmol/L (ref 135–145)
Total Bilirubin: 0.3 mg/dL (ref 0.3–1.2)
Total Protein: 6.7 g/dL (ref 6.5–8.1)

## 2021-10-04 LAB — LACTATE DEHYDROGENASE: LDH: 133 U/L (ref 98–192)

## 2021-10-04 MED ORDER — TADALAFIL 5 MG PO TABS: 5.0000 mg | ORAL_TABLET | Freq: Every day | ORAL | 0 refills | Status: DC | PRN

## 2021-10-04 NOTE — Progress Notes (Signed)
[place] [phone]   [phone]  PROGRESS NOTE  Patient Care Team: Fanny Bien, MD as PCP - General (Family Medicine)  Hematological/Oncological History # CD30-Positive T-cell Lymphoma, Stage III/IV 07/09/2020: patient underwent cholecystectomy. Liver wedge biopsy during the procedure revealed involvement of a CD30-positive T-cell lymphoproliferative disorder 07/25/2020: establish care with Dr. Lorenso Courier  08/08/2020: PET CT scan showed innumerable hypermetabolic lesions (primarily Deauville 4) in the liver and skeleton. Left neck adenopathy including a dominant level IB lymph node which is Deauville 5. 08/15/2020: Cycle 1 Day 1 of BV-CHP  09/04/2020: Cycle 2 Day 1 of BV-CHP 09/27/2020:  Cycle 3 Day 1 of BV-CHP 10/18/2020: Cycle 4 Day 1 of BV-CHP 11/08/2020: Cycle 5 Day 1 of BV-CHP 11/29/2020: Cycle 6 Day 1 of BV-CHP 12/26/2020: PET CT scan showed further improvement compared to the 11/05/2020 exam, with mild reduction in activity in the extensive scattered mixed density skeletal lesions (although still Deauville 4) and no substantial hypermetabolic adenopathy currently identified 02/11/2021: Bone marrow biopsy performed due to concern for FDG avidity of bone marrow.  No evidence of residual disease noted on bone marrow biopsy.  Interval History:  Maurice Fox 71 y.o. male with medical history significant for T cell lymphoma (Stage III/IV) presents for a follow up visit. The patient's last visit was on 06/28/2021.  In the interim since that time he has had no major changes in his health.  On exam today Maurice Fox reports his energy levels can be quite low and he stays tired a lot.  He is not sure if it is "laziness" or the fact that he is working second and third shift.  He notes he is try to do his best to adjust.  He notes his appetite is not great.  He is down 1 pound in the interim since her last visit.  He has not started any new medications.  He  reports that has not had any signs or symptoms concerning for recurrent disease.  Of note he is concerned that the Viagra medication is not working well for him and he has requested a prescription for a different erectile dysfunction medication.  He not noticed any new lymphadenopathy or had any signs or symptoms of recurrence.  Patient denies any fevers, chills, shortness of breath, chest pain or constipation. He has no other complaints.  A full 10 point ROS is listed below.  MEDICAL HISTORY:  Past Medical History:  Diagnosis Date   Arthritis    Hyperlipidemia    Hypertension    followed by pcp  (11-23-2019  per pt had stress test greater than 20 yrs ago, told ok)   Nocturia    OSA on CPAP    per pt uses nightly   Phimosis    Type 2 diabetes mellitus ([place])    followed by pcp   (11-23-2019 per pt does not check blood sugar at home)   Wears glasses     SURGICAL HISTORY: Past Surgical History:  Procedure Laterality Date   APPENDECTOMY  child   CHOLECYSTECTOMY N/A 07/09/2020   Procedure: LAPAROSCOPIC CHOLECYSTECTOMY WITH INTRAOPERATIVE CHOLANGIOGRAM,;  Surgeon: Greer Pickerel, MD;  Location: [facility];  Service: General;  Laterality: N/A;   CIRCUMCISION N/A 11/29/2019   Procedure: CIRCUMCISION ADULT;  Surgeon: [name], MD;  Location: [facility];  Service: Urology;  Laterality: N/A;   IR IMAGING GUIDED PORT INSERTION  08/06/2020   IR REMOVAL TUN ACCESS W/ PORT W/O FL MOD SED  04/09/2021  LIVER BIOPSY N/A 07/09/2020   Procedure: LAP LIVER BIOPSY;  Surgeon: Greer Pickerel, MD;  Location: [facility];  Service: General;  Laterality: N/A;   ROTATOR CUFF REPAIR Left chld   SUPRA-UMBILICAL HERNIA N/A 7/61/9509   Procedure: PRIMARY REPAIR SUPRA-UMBILICAL HERNIA;  Surgeon: Greer Pickerel, MD;  Location: [facility];  Service: General;  Laterality: N/A;    SOCIAL HISTORY: Social History   Socioeconomic History   Marital status: Divorced    Spouse name: Not on file   Number of children:  Not on file   Years of education: Not on file   Highest education level: Not on file  Occupational History   Not on file  Tobacco Use   Smoking status: Every Day    Packs/day: 1.00    Years: 49.00    Total pack years: 49.00    Types: Cigars, Cigarettes   Smokeless tobacco: Never  Vaping Use   Vaping Use: Never used  Substance and Sexual Activity   Alcohol use: Not Currently   Drug use: Never   Sexual activity: Not on file  Other Topics Concern   Not on file  Social History Narrative   Not on file   Social Determinants of Health   Financial Resource Strain: Not on file  Food Insecurity: Not on file  Transportation Needs: Not on file  Physical Activity: Not on file  Stress: Not on file  Social Connections: Not on file  Intimate Partner Violence: Not on file    FAMILY HISTORY: Family History  Problem Relation Age of Onset   Hypotension Mother    Diabetes Mellitus II Father    Prostate cancer Brother    Prostate cancer Brother     ALLERGIES:  has No Known Allergies.  MEDICATIONS:  Current Outpatient Medications  Medication Sig Dispense Refill   tadalafil (CIALIS) 5 MG tablet Take 1 tablet (5 mg total) by mouth daily as needed for erectile dysfunction. 10 tablet 0   amLODipine (NORVASC) 5 MG tablet 1 tablet     aspirin 81 MG EC tablet 1 tablet     atorvastatin (LIPITOR) 10 MG tablet Take 1 tablet by mouth daily.     azelastine (ASTELIN) 0.1 % nasal spray 1 puff in each nostril     diclofenac Sodium (VOLTAREN) 1 % GEL Apply 2 g topically 4 (four) times daily as needed for pain.     Empagliflozin-metFORMIN HCl ER 25-1000 MG TB24 Take 1 tablet by mouth at bedtime.     fluticasone (FLONASE) 50 MCG/ACT nasal spray Place 2 sprays into both nostrils in the morning and at bedtime.     lidocaine-prilocaine (EMLA) cream Apply 1 application topically as needed. 30 g 0   methocarbamol (ROBAXIN) 500 MG tablet Take 1 tablet (500 mg total) by mouth every 8 (eight) hours as needed  for muscle spasms. 30 tablet 0   Multiple Vitamins-Minerals (CENTRUM SILVER PO) Take 1 tablet by mouth at bedtime.     ondansetron (ZOFRAN) 8 MG tablet Take 1 tablet (8 mg total) by mouth every 8 (eight) hours as needed for nausea or vomiting. (Patient not taking: Reported on 03/28/2021) 30 tablet 0   predniSONE (DELTASONE) 20 MG tablet Take 3 tablets (60 mg total) by mouth daily with breakfast. 15 tablet 5   prochlorperazine (COMPAZINE) 10 MG tablet Take 1 tablet (10 mg total) by mouth every 6 (six) hours as needed for nausea or vomiting. (Patient not taking: Reported on 03/28/2021) 30 tablet 0   Propylene Glycol (SYSTANE  BALANCE OP) Place 1 drop into both eyes 2 (two) times daily.     sildenafil (VIAGRA) 100 MG tablet Take 100 mg by mouth daily as needed (ED).     tamsulosin (FLOMAX) 0.4 MG CAPS capsule Take 0.4 mg by mouth at bedtime.     No current facility-administered medications for this visit.    REVIEW OF SYSTEMS:   Constitutional: ( - ) fevers, ( - )  chills , ( - ) night sweats Eyes: ( - ) blurriness of vision, ( - ) double vision, ( - ) watery eyes Ears, nose, mouth, throat, and face: ( - ) mucositis, ( - ) sore throat Respiratory: ( - ) cough, ( - ) dyspnea, ( - ) wheezes Cardiovascular: ( - ) palpitation, ( - ) chest discomfort, ( - ) lower extremity swelling Gastrointestinal:  ( - ) nausea, ( - ) heartburn, ( - ) change in bowel habits Skin: ( - ) abnormal skin rashes Lymphatics: ( - ) new lymphadenopathy, ( - ) easy bruising Neurological: ( - ) numbness, ( - ) tingling, ( - ) new weaknesses Behavioral/Psych: ( - ) mood change, ( - ) new changes  All other systems were reviewed with the patient and are negative.  PHYSICAL EXAMINATION: ECOG PERFORMANCE STATUS: 1 - Symptomatic but completely ambulatory  Vitals:   10/04/21 1439  BP: (!) 157/74  Pulse: (!) 108  Resp: 17  Temp: 98.6 F (37 C)  SpO2: 95%     Filed Weights   10/04/21 1439  Weight: 234 lb 14.4 oz (106.5  kg)     GENERAL: Well-appearing elderly African-American male, alert, no distress and comfortable SKIN: skin color, texture, turgor are normal, no rashes or significant lesions EYES: conjunctiva are pink and non-injected, sclera clear NECK: supple, non-tender LYMPH: resolved left submandibular lymph node, otherwise no palpable lymphadenopathy in the cervical, axillary or inguinal LUNGS: clear to auscultation and percussion with normal breathing effort HEART: regular rate & rhythm and no murmurs and no lower extremity edema PSYCH: alert & oriented x 3, fluent speech NEURO: no focal motor/sensory deficits  LABORATORY DATA:  I have reviewed the data as listed    Latest Ref Rng & Units 10/04/2021    1:39 PM 06/28/2021    1:40 PM 03/28/2021    2:35 PM  CBC  WBC 4.0 - 10.5 K/uL 5.3  4.8  4.9   Hemoglobin 13.0 - 17.0 g/dL 15.3  15.6  14.6   Hematocrit 39.0 - 52.0 % 46.1  45.7  42.7   Platelets 150 - 400 K/uL 184  174  162        Latest Ref Rng & Units 10/04/2021    1:39 PM 06/28/2021    1:40 PM 06/21/2021   10:48 AM  CMP  Glucose 70 - 99 mg/dL 175  310    BUN 8 - 23 mg/dL 23  17    Creatinine 0.61 - 1.24 mg/dL 1.36  0.97  1.00   Sodium 135 - 145 mmol/L 144  137    Potassium 3.5 - 5.1 mmol/L 3.4  3.7    Chloride 98 - 111 mmol/L 112  106    CO2 22 - 32 mmol/L 25  26    Calcium 8.9 - 10.3 mg/dL 9.3  9.8    Total Protein 6.5 - 8.1 g/dL 6.7  7.3    Total Bilirubin 0.3 - 1.2 mg/dL 0.3  0.5    Alkaline Phos 38 - 126 U/L 86  111    AST 15 - 41 U/L 24  16    ALT 0 - 44 U/L 27  16     RADIOGRAPHIC STUDIES: No images were reviewed during this visit.   ASSESSMENT & PLAN Maurice Fox 71 y.o. male with medical history significant for T cell lymphoma (Stage III/IV) presents for a follow up visit.  After review of the labs, review of the records, and discussion with the patient the patients findings are most consistent with a peripheral CD30 positive T-cell lymphoma.  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  The patient has been staged with a PET CT scan which showed clear involvement of the skeleton and liver, most consistent with stage IV disease.  Given these findings we will plan for 6 cycles of BV CHP chemotherapy.  The regimen of BV CHP consists of brentuximab 1.8 mg/kg IV on day 1, cyclophosphamide 750 mg per metered squared on day 1, doxorubicin 50 mg per metered squared on day 1, and prednisone 60 mg p.o. day 1 through 5.  This is to be continued every 21 days with a plan for up to 6 cycles.  After the third or fourth cycle we will consider restaging PET CT scan in order to evaluate for response.  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 --TTE performed 07/08/2020, shows EF of 50-55% --negative HIV, Hep B and C serologies. --GCSF support on Day 3 of each cycle.  -LDH, Liver enzymes and Alk Phos are improving. Patient will proceed with treatment today.  --PET CT scan performed on 11/05/2020, findings show Deauville 2 disease with some Deauville 4 noted in the skeleton. Post treatment PET after the last cycle showed diminished FDG avidity, but still Deauville 4.  -- Bone marrow biopsy on 02/11/2021 shows no evidence of residual disease. Plan: -- Per NCCN recommendations we will have the patient return to clinic every 3 to 6 months with imaging on a 37-monthbasis for the first 2 years --Labs at each clinic visit to include CBC, CMP, LDH.  Labs today show white blood cell count 5.3, hemoglobin 15.3, MCV 88.7, and platelets of 184 --last CT scan on 06/21/2021, next due in Dec 2023.  --Return to clinic in 3 months time with CT imaging.   #Erectile Dysfunction -- Patient notes Viagra is not working for him.  We will provide a prescription for Cialis today.  If this remains  ineffective would make referral to urology.  #Supportive Care -- port removed 04/09/2021  Orders Placed This Encounter  Procedures   CT CHEST ABDOMEN PELVIS W CONTRAST    Standing Status:   Future    Standing Expiration Date:   10/17/2022    Order Specific Question:   [facility]    [facility]   Order Specific Question:   Is Oral Contrast requested for this exam?    Answer:   Yes, Per Radiology protocol   All questions were answered. The patient knows to call the clinic with any problems, questions or concerns.  A total of more than 30 minutes were spent on this encounter with face-to-face time and non-face-to-face time, including preparing to see the patient, ordering medications, counseling the patient and coordination of care as outlined above.   JLedell Peoples MD Department of Hematology/Oncology [facility]  [place] [facility] Phone: [phone] Pager: [phone] Email: [email or web address]  10/16/2021 10:33 AM

## 2021-10-09 DIAGNOSIS — Z1339 Encounter for screening examination for other mental health and behavioral disorders: Secondary | ICD-10-CM | POA: Diagnosis not present

## 2021-10-09 DIAGNOSIS — Z1331 Encounter for screening for depression: Secondary | ICD-10-CM | POA: Diagnosis not present

## 2021-10-09 DIAGNOSIS — Z0000 Encounter for general adult medical examination without abnormal findings: Secondary | ICD-10-CM | POA: Diagnosis not present

## 2021-10-16 ENCOUNTER — Encounter: Payer: Self-pay | Admitting: Hematology and Oncology

## 2021-10-18 DIAGNOSIS — E559 Vitamin D deficiency, unspecified: Secondary | ICD-10-CM | POA: Diagnosis not present

## 2021-10-18 DIAGNOSIS — E782 Mixed hyperlipidemia: Secondary | ICD-10-CM | POA: Diagnosis not present

## 2021-10-18 DIAGNOSIS — E1165 Type 2 diabetes mellitus with hyperglycemia: Secondary | ICD-10-CM | POA: Diagnosis not present

## 2021-10-24 DIAGNOSIS — E1165 Type 2 diabetes mellitus with hyperglycemia: Secondary | ICD-10-CM | POA: Diagnosis not present

## 2021-10-24 DIAGNOSIS — E782 Mixed hyperlipidemia: Secondary | ICD-10-CM | POA: Diagnosis not present

## 2021-10-24 DIAGNOSIS — I10 Essential (primary) hypertension: Secondary | ICD-10-CM | POA: Diagnosis not present

## 2021-10-24 DIAGNOSIS — Z23 Encounter for immunization: Secondary | ICD-10-CM | POA: Diagnosis not present

## 2021-10-24 DIAGNOSIS — N529 Male erectile dysfunction, unspecified: Secondary | ICD-10-CM | POA: Diagnosis not present

## 2021-10-24 DIAGNOSIS — E559 Vitamin D deficiency, unspecified: Secondary | ICD-10-CM | POA: Diagnosis not present

## 2021-10-26 DIAGNOSIS — E1165 Type 2 diabetes mellitus with hyperglycemia: Secondary | ICD-10-CM | POA: Diagnosis not present

## 2021-11-26 DIAGNOSIS — E1165 Type 2 diabetes mellitus with hyperglycemia: Secondary | ICD-10-CM | POA: Diagnosis not present

## 2021-12-17 DIAGNOSIS — E1165 Type 2 diabetes mellitus with hyperglycemia: Secondary | ICD-10-CM | POA: Diagnosis not present

## 2021-12-17 DIAGNOSIS — I10 Essential (primary) hypertension: Secondary | ICD-10-CM | POA: Diagnosis not present

## 2021-12-17 DIAGNOSIS — J309 Allergic rhinitis, unspecified: Secondary | ICD-10-CM | POA: Diagnosis not present

## 2021-12-27 DIAGNOSIS — E1165 Type 2 diabetes mellitus with hyperglycemia: Secondary | ICD-10-CM | POA: Diagnosis not present

## 2022-01-02 ENCOUNTER — Ambulatory Visit (HOSPITAL_COMMUNITY)
Admission: RE | Admit: 2022-01-02 | Discharge: 2022-01-02 | Disposition: A | Discharge status: Home / Self Care | Payer: Medicare PPO | Source: Ambulatory Visit | Attending: Hematology and Oncology | Admitting: Hematology and Oncology

## 2022-01-02 DIAGNOSIS — R918 Other nonspecific abnormal finding of lung field: Secondary | ICD-10-CM | POA: Diagnosis not present

## 2022-01-02 DIAGNOSIS — I2510 Atherosclerotic heart disease of native coronary artery without angina pectoris: Secondary | ICD-10-CM | POA: Diagnosis not present

## 2022-01-02 DIAGNOSIS — Q6102 Congenital multiple renal cysts: Secondary | ICD-10-CM | POA: Insufficient documentation

## 2022-01-02 DIAGNOSIS — C8442 Peripheral T-cell lymphoma, not classified, intrathoracic lymph nodes: Secondary | ICD-10-CM | POA: Insufficient documentation

## 2022-01-02 DIAGNOSIS — M899 Disorder of bone, unspecified: Secondary | ICD-10-CM | POA: Diagnosis not present

## 2022-01-02 DIAGNOSIS — E119 Type 2 diabetes mellitus without complications: Secondary | ICD-10-CM | POA: Insufficient documentation

## 2022-01-02 DIAGNOSIS — I700 Atherosclerosis of aorta: Secondary | ICD-10-CM | POA: Insufficient documentation

## 2022-01-02 DIAGNOSIS — C9150 Adult T-cell lymphoma/leukemia (HTLV-1-associated) not having achieved remission: Secondary | ICD-10-CM | POA: Diagnosis not present

## 2022-01-02 DIAGNOSIS — N289 Disorder of kidney and ureter, unspecified: Secondary | ICD-10-CM | POA: Diagnosis not present

## 2022-01-02 LAB — POCT I-STAT CREATININE: Creatinine, Ser: 1.2 mg/dL (ref 0.61–1.24)

## 2022-01-02 MED ORDER — IOHEXOL 300 MG/ML  SOLN
100.0000 mL | Freq: Once | INTRAMUSCULAR | Status: AC | PRN
  Administered 2022-01-02: 100 mL via INTRAVENOUS

## 2022-01-02 MED ORDER — SODIUM CHLORIDE (PF) 0.9 % IJ SOLN
INTRAMUSCULAR | Status: AC
  Filled 2022-01-02: qty 50

## 2022-01-06 ENCOUNTER — Telehealth: Payer: Self-pay | Admitting: *Deleted

## 2022-01-06 NOTE — Telephone Encounter (Signed)
TCT patient regarding recent CT scan results. Spoke with him. Advised that his scan showed no new or recurrent disease.  Pt pleased with results. He is aware of his appts here on Friday, 01/10/22

## 2022-01-06 NOTE — Telephone Encounter (Signed)
-----   Message from Maurice Slick, MD sent at 01/03/2022  4:38 PM EST ----- Please let [name] know that his CT scan showed no evidence of residual or recurrent disease. We will see him back later this month.   ----- Message ----- From: Interface, Rad Results In Sent: 01/03/2022   4:33 PM EST To: Maurice Slick, MD

## 2022-01-10 ENCOUNTER — Other Ambulatory Visit: Payer: Self-pay | Admitting: Hematology and Oncology

## 2022-01-10 ENCOUNTER — Inpatient Hospital Stay (HOSPITAL_BASED_OUTPATIENT_CLINIC_OR_DEPARTMENT_OTHER): Payer: Medicare PPO | Admitting: Hematology and Oncology

## 2022-01-10 ENCOUNTER — Inpatient Hospital Stay: Payer: Medicare PPO | Attending: Hematology and Oncology

## 2022-01-10 VITALS — BP 131/82 | HR 95 | Temp 97.4°F | Resp 17 | Wt 239.4 lb

## 2022-01-10 DIAGNOSIS — C8442 Peripheral T-cell lymphoma, not classified, intrathoracic lymph nodes: Secondary | ICD-10-CM

## 2022-01-10 DIAGNOSIS — N529 Male erectile dysfunction, unspecified: Secondary | ICD-10-CM | POA: Insufficient documentation

## 2022-01-10 DIAGNOSIS — Z79899 Other long term (current) drug therapy: Secondary | ICD-10-CM | POA: Insufficient documentation

## 2022-01-10 DIAGNOSIS — C8470 Anaplastic large cell lymphoma, ALK-negative, unspecified site: Secondary | ICD-10-CM

## 2022-01-10 DIAGNOSIS — F17210 Nicotine dependence, cigarettes, uncomplicated: Secondary | ICD-10-CM | POA: Insufficient documentation

## 2022-01-10 DIAGNOSIS — Z8572 Personal history of non-Hodgkin lymphomas: Secondary | ICD-10-CM | POA: Diagnosis not present

## 2022-01-10 DIAGNOSIS — C8499 Mature T/NK-cell lymphomas, unspecified, extranodal and solid organ sites: Secondary | ICD-10-CM | POA: Diagnosis not present

## 2022-01-10 LAB — CMP (CANCER CENTER ONLY)
ALT: 25 U/L (ref 0–44)
AST: 21 U/L (ref 15–41)
Albumin: 4 g/dL (ref 3.5–5.0)
Alkaline Phosphatase: 89 U/L (ref 38–126)
Anion gap: 7 (ref 5–15)
BUN: 25 mg/dL — ABNORMAL HIGH (ref 8–23)
CO2: 27 mmol/L (ref 22–32)
Calcium: 9.3 mg/dL (ref 8.9–10.3)
Chloride: 106 mmol/L (ref 98–111)
Creatinine: 1.21 mg/dL (ref 0.61–1.24)
GFR, Estimated: >60 mL/min (ref >60)
Glucose, Bld: 129 mg/dL — ABNORMAL HIGH (ref 70–99)
Potassium: 3.4 mmol/L — ABNORMAL LOW (ref 3.5–5.1)
Sodium: 140 mmol/L (ref 135–145)
Total Bilirubin: 0.5 mg/dL (ref 0.3–1.2)
Total Protein: 7 g/dL (ref 6.5–8.1)

## 2022-01-10 LAB — CBC WITH DIFFERENTIAL (CANCER CENTER ONLY)
Abs Immature Granulocytes: 0.01 10*3/uL (ref 0.00–0.07)
Basophils Absolute: 0.1 10*3/uL (ref 0.0–0.1)
Basophils Relative: 1 %
Eosinophils Absolute: 0.3 10*3/uL (ref 0.0–0.5)
Eosinophils Relative: 5 %
HCT: 47.3 % (ref 39.0–52.0)
Hemoglobin: 16.2 g/dL (ref 13.0–17.0)
Immature Granulocytes: 0 %
Lymphocytes Relative: 31 %
Lymphs Abs: 1.9 10*3/uL (ref 0.7–4.0)
MCH: 30.5 pg (ref 26.0–34.0)
MCHC: 34.2 g/dL (ref 30.0–36.0)
MCV: 88.9 fL (ref 80.0–100.0)
Monocytes Absolute: 0.6 10*3/uL (ref 0.1–1.0)
Monocytes Relative: 10 %
Neutro Abs: 3.2 10*3/uL (ref 1.7–7.7)
Neutrophils Relative %: 53 %
Platelet Count: 185 10*3/uL (ref 150–400)
RBC: 5.32 MIL/uL (ref 4.22–5.81)
RDW: 14.3 % (ref 11.5–15.5)
WBC Count: 6.1 10*3/uL (ref 4.0–10.5)
nRBC: 0 % (ref 0.0–0.2)

## 2022-01-10 LAB — LACTATE DEHYDROGENASE: LDH: 147 U/L (ref 98–192)

## 2022-01-10 NOTE — Progress Notes (Signed)
[place] [phone]   [phone]  PROGRESS NOTE  Patient Care Team: Fanny Bien, MD as PCP - General (Family Medicine)  Hematological/Oncological History # CD30-Positive T-cell Lymphoma, Stage III/IV 07/09/2020: patient underwent cholecystectomy. Liver wedge biopsy during the procedure revealed involvement of a CD30-positive T-cell lymphoproliferative disorder 07/25/2020: establish care with Dr. Lorenso Courier  08/08/2020: PET CT scan showed innumerable hypermetabolic lesions (primarily Deauville 4) in the liver and skeleton. Left neck adenopathy including a dominant level IB lymph node which is Deauville 5. 08/15/2020: Cycle 1 Day 1 of BV-CHP  09/04/2020: Cycle 2 Day 1 of BV-CHP 09/27/2020:  Cycle 3 Day 1 of BV-CHP 10/18/2020: Cycle 4 Day 1 of BV-CHP 11/08/2020: Cycle 5 Day 1 of BV-CHP 11/29/2020: Cycle 6 Day 1 of BV-CHP 12/26/2020: PET CT scan showed further improvement compared to the 11/05/2020 exam, with mild reduction in activity in the extensive scattered mixed density skeletal lesions (although still Deauville 4) and no substantial hypermetabolic adenopathy currently identified 02/11/2021: Bone marrow biopsy performed due to concern for FDG avidity of bone marrow.  No evidence of residual disease noted on bone marrow biopsy.  Interval History:  Maurice Fox 71 y.o. male with medical history significant for T cell lymphoma (Stage III/IV) presents for a follow up visit. The patient's last visit was on 10/04/2021.  In the interim since that time he has had no major changes in his health.  On exam today [name] reports he has been okay in the interim since her last visit.  He did have a episode a few weeks ago he had back pain for approximate 2 to 3 days but this subsequently resolved.  He does have trouble sleeping and is only able to sleep a few hours at a time.  He does work at night 3 days a week and so his energy is just "so-so".  He notes his  appetite has been good.  He denies any trouble with recent infections such as sinus issues or pneumonia but he does have a chronic stable cough.  He is not having any shortness of breath or anything else out of the ordinary.  He checks his lymph nodes on a regular basis in the shower and has not noticed any new bumps or lumps.  Overall his health is stable and he feels well.  Patient denies any fevers, chills, shortness of breath, chest pain or constipation. He has no other complaints.  A full 10 point ROS is listed below.  MEDICAL HISTORY:  Past Medical History:  Diagnosis Date   Arthritis    Hyperlipidemia    Hypertension    followed by pcp  (11-23-2019  per pt had stress test greater than 20 yrs ago, told ok)   Nocturia    OSA on CPAP    per pt uses nightly   Phimosis    Type 2 diabetes mellitus ([place])    followed by pcp   (11-23-2019 per pt does not check blood sugar at home)   Wears glasses     SURGICAL HISTORY: Past Surgical History:  Procedure Laterality Date   APPENDECTOMY  child   CHOLECYSTECTOMY N/A 07/09/2020   Procedure: LAPAROSCOPIC CHOLECYSTECTOMY WITH INTRAOPERATIVE CHOLANGIOGRAM,;  Surgeon: Greer Pickerel, MD;  Location: [facility];  Service: General;  Laterality: N/A;   CIRCUMCISION N/A 11/29/2019   Procedure: CIRCUMCISION ADULT;  Surgeon: [name], MD;  Location: [facility];  Service: Urology;  Laterality: N/A;   IR IMAGING GUIDED PORT INSERTION  08/06/2020   IR REMOVAL TUN ACCESS W/ PORT W/O FL MOD SED  04/09/2021   LIVER BIOPSY N/A 07/09/2020   Procedure: LAP LIVER BIOPSY;  Surgeon: Greer Pickerel, MD;  Location: [facility];  Service: General;  Laterality: N/A;   ROTATOR CUFF REPAIR Left chld   SUPRA-UMBILICAL HERNIA N/A 2/59/5638   Procedure: PRIMARY REPAIR SUPRA-UMBILICAL HERNIA;  Surgeon: Greer Pickerel, MD;  Location: [facility];  Service: General;  Laterality: N/A;    SOCIAL HISTORY: Social History   Socioeconomic History   Marital status: Divorced     Spouse name: Not on file   Number of children: Not on file   Years of education: Not on file   Highest education level: Not on file  Occupational History   Not on file  Tobacco Use   Smoking status: Every Day    Packs/day: 1.00    Years: 49.00    Total pack years: 49.00    Types: Cigars, Cigarettes   Smokeless tobacco: Never  Vaping Use   Vaping Use: Never used  Substance and Sexual Activity   Alcohol use: Not Currently   Drug use: Never   Sexual activity: Not on file  Other Topics Concern   Not on file  Social History Narrative   Not on file   Social Determinants of Health   Financial Resource Strain: Not on file  Food Insecurity: Not on file  Transportation Needs: Not on file  Physical Activity: Not on file  Stress: Not on file  Social Connections: Not on file  Intimate Partner Violence: Not on file    FAMILY HISTORY: Family History  Problem Relation Age of Onset   Hypotension Mother    Diabetes Mellitus II Father    Prostate cancer Brother    Prostate cancer Brother     ALLERGIES:  has No Known Allergies.  MEDICATIONS:  Current Outpatient Medications  Medication Sig Dispense Refill   amLODipine (NORVASC) 5 MG tablet 1 tablet     aspirin 81 MG EC tablet 1 tablet     atorvastatin (LIPITOR) 10 MG tablet Take 1 tablet by mouth daily.     azelastine (ASTELIN) 0.1 % nasal spray 1 puff in each nostril     diclofenac Sodium (VOLTAREN) 1 % GEL Apply 2 g topically 4 (four) times daily as needed for pain.     Empagliflozin-metFORMIN HCl ER 25-1000 MG TB24 Take 1 tablet by mouth at bedtime.     fluticasone (FLONASE) 50 MCG/ACT nasal spray Place 2 sprays into both nostrils in the morning and at bedtime.     lidocaine-prilocaine (EMLA) cream Apply 1 application topically as needed. 30 g 0   methocarbamol (ROBAXIN) 500 MG tablet Take 1 tablet (500 mg total) by mouth every 8 (eight) hours as needed for muscle spasms. 30 tablet 0   Multiple Vitamins-Minerals (CENTRUM  SILVER PO) Take 1 tablet by mouth at bedtime.     ondansetron (ZOFRAN) 8 MG tablet Take 1 tablet (8 mg total) by mouth every 8 (eight) hours as needed for nausea or vomiting. (Patient not taking: Reported on 03/28/2021) 30 tablet 0   predniSONE (DELTASONE) 20 MG tablet Take 3 tablets (60 mg total) by mouth daily with breakfast. 15 tablet 5   prochlorperazine (COMPAZINE) 10 MG tablet Take 1 tablet (10 mg total) by mouth every 6 (six) hours as needed for nausea or vomiting. (Patient not taking: Reported on 03/28/2021) 30 tablet 0   Propylene Glycol (SYSTANE BALANCE OP) Place 1 drop into both  eyes 2 (two) times daily.     sildenafil (VIAGRA) 100 MG tablet Take 100 mg by mouth daily as needed (ED).     tadalafil (CIALIS) 5 MG tablet Take 1 tablet (5 mg total) by mouth daily as needed for erectile dysfunction. 10 tablet 0   tamsulosin (FLOMAX) 0.4 MG CAPS capsule Take 0.4 mg by mouth at bedtime.     No current facility-administered medications for this visit.    REVIEW OF SYSTEMS:   Constitutional: ( - ) fevers, ( - )  chills , ( - ) night sweats Eyes: ( - ) blurriness of vision, ( - ) double vision, ( - ) watery eyes Ears, nose, mouth, throat, and face: ( - ) mucositis, ( - ) sore throat Respiratory: ( - ) cough, ( - ) dyspnea, ( - ) wheezes Cardiovascular: ( - ) palpitation, ( - ) chest discomfort, ( - ) lower extremity swelling Gastrointestinal:  ( - ) nausea, ( - ) heartburn, ( - ) change in bowel habits Skin: ( - ) abnormal skin rashes Lymphatics: ( - ) new lymphadenopathy, ( - ) easy bruising Neurological: ( - ) numbness, ( - ) tingling, ( - ) new weaknesses Behavioral/Psych: ( - ) mood change, ( - ) new changes  All other systems were reviewed with the patient and are negative.  PHYSICAL EXAMINATION: ECOG PERFORMANCE STATUS: 1 - Symptomatic but completely ambulatory  Vitals:   01/10/22 0813  BP: 131/82  Pulse: 95  Resp: 17  Temp: (!) 97.4 F (36.3 C)  SpO2: 95%     Filed Weights    01/10/22 0813  Weight: 239 lb 6 oz (108.6 kg)     GENERAL: Well-appearing elderly African-American male, alert, no distress and comfortable SKIN: skin color, texture, turgor are normal, no rashes or significant lesions EYES: conjunctiva are pink and non-injected, sclera clear NECK: supple, non-tender LYMPH: resolved left submandibular lymph node, otherwise no palpable lymphadenopathy in the cervical, axillary or inguinal LUNGS: clear to auscultation and percussion with normal breathing effort HEART: regular rate & rhythm and no murmurs and no lower extremity edema PSYCH: alert & oriented x 3, fluent speech NEURO: no focal motor/sensory deficits  LABORATORY DATA:  I have reviewed the data as listed    Latest Ref Rng & Units 01/10/2022    7:38 AM 10/04/2021    1:39 PM 06/28/2021    1:40 PM  CBC  WBC 4.0 - 10.5 K/uL 6.1  5.3  4.8   Hemoglobin 13.0 - 17.0 g/dL 16.2  15.3  15.6   Hematocrit 39.0 - 52.0 % 47.3  46.1  45.7   Platelets 150 - 400 K/uL 185  184  174        Latest Ref Rng & Units 01/10/2022    7:38 AM 01/02/2022    5:50 PM 10/04/2021    1:39 PM  CMP  Glucose 70 - 99 mg/dL 129   175   BUN 8 - 23 mg/dL 25   23   Creatinine 0.61 - 1.24 mg/dL 1.21  1.20  1.36   Sodium 135 - 145 mmol/L 140   144   Potassium 3.5 - 5.1 mmol/L 3.4   3.4   Chloride 98 - 111 mmol/L 106   112   CO2 22 - 32 mmol/L 27   25   Calcium 8.9 - 10.3 mg/dL 9.3   9.3   Total Protein 6.5 - 8.1 g/dL 7.0   6.7   Total Bilirubin 0.3 -  1.2 mg/dL 0.5   0.3   Alkaline Phos 38 - 126 U/L 89   86   AST 15 - 41 U/L 21   24   ALT 0 - 44 U/L 25   27    RADIOGRAPHIC STUDIES: No images were reviewed during this visit.   ASSESSMENT & PLAN YUUKI SKEENS 71 y.o. male with medical history significant for T cell lymphoma (Stage III/IV) presents for a follow up visit.  After review of the labs, review of the records, and discussion with the patient the patients findings are most consistent with a peripheral CD30  positive T-cell lymphoma.  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  The patient has been staged with a PET CT scan which showed clear involvement of the skeleton and liver, most consistent with stage IV disease.  Given these findings we will plan for 6 cycles of BV CHP chemotherapy.  The regimen of BV CHP consists of brentuximab 1.8 mg/kg IV on day 1, cyclophosphamide 750 mg per metered squared on day 1, doxorubicin 50 mg per metered squared on day 1, and prednisone 60 mg p.o. day 1 through 5.  This is to be continued every 21 days with a plan for up to 6 cycles.  After the third or fourth cycle we will consider restaging PET CT scan in order to evaluate for response.  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 --TTE performed 07/08/2020, shows EF of 50-55% --negative HIV, Hep B and C serologies. --GCSF support on Day 3 of each cycle.  -LDH, Liver enzymes and Alk Phos are improving. Patient will proceed with treatment today.  --PET CT scan performed on 11/05/2020, findings show Deauville 2 disease with some Deauville 4 noted in the skeleton. Post treatment PET after the last cycle showed diminished FDG avidity, but still Deauville 4.  -- Bone marrow biopsy on 02/11/2021 shows no evidence of residual disease. Plan: -- Per NCCN recommendations we will have the patient return to clinic every 3 to 6 months with imaging on a 29-monthbasis for the first 2 years --Labs at each clinic visit to include CBC, CMP, LDH.  Labs today show white blood cell count 6.1, hemoglobin 16.2, MCV 88.9, and platelets of 185 --last CT scan on 01/02/2022, no evidence of residual/recurrent disease. --Return to clinic in 3 months time with CT imaging in 6 months.   #Erectile Dysfunction -- Patient notes Viagra is not  working for him.  We will provide a prescription for Cialis today.  If this remains ineffective would make referral to urology.  #Supportive Care -- port removed 04/09/2021  No orders of the defined types were placed in this encounter.  All questions were answered. The patient knows to call the clinic with any problems, questions or concerns.  A total of more than 30 minutes were spent on this encounter with face-to-face time and non-face-to-face time, including preparing to see the patient, ordering medications, counseling the patient and coordination of care as outlined above.   JLedell Peoples MD Department of Hematology/Oncology [facility]LLCPhone: [phone]: [phone]: [email or web address]  01/10/2022 9:22 AM

## 2022-01-14 ENCOUNTER — Telehealth: Payer: Self-pay | Admitting: Hematology and Oncology

## 2022-01-14 NOTE — Telephone Encounter (Signed)
Called patient to notify of new appointments. Patient notified.  

## 2022-01-27 DIAGNOSIS — Z720 Tobacco use: Secondary | ICD-10-CM | POA: Diagnosis not present

## 2022-01-27 DIAGNOSIS — E1165 Type 2 diabetes mellitus with hyperglycemia: Secondary | ICD-10-CM | POA: Diagnosis not present

## 2022-01-27 DIAGNOSIS — I10 Essential (primary) hypertension: Secondary | ICD-10-CM | POA: Diagnosis not present

## 2022-01-29 DIAGNOSIS — Z114 Encounter for screening for human immunodeficiency virus [HIV]: Secondary | ICD-10-CM | POA: Diagnosis not present

## 2022-01-29 DIAGNOSIS — E782 Mixed hyperlipidemia: Secondary | ICD-10-CM | POA: Diagnosis not present

## 2022-01-29 DIAGNOSIS — Z125 Encounter for screening for malignant neoplasm of prostate: Secondary | ICD-10-CM | POA: Diagnosis not present

## 2022-01-29 DIAGNOSIS — Z0000 Encounter for general adult medical examination without abnormal findings: Secondary | ICD-10-CM | POA: Diagnosis not present

## 2022-01-29 DIAGNOSIS — Z13220 Encounter for screening for lipoid disorders: Secondary | ICD-10-CM | POA: Diagnosis not present

## 2022-02-03 DIAGNOSIS — J069 Acute upper respiratory infection, unspecified: Secondary | ICD-10-CM | POA: Diagnosis not present

## 2022-02-03 DIAGNOSIS — Z1211 Encounter for screening for malignant neoplasm of colon: Secondary | ICD-10-CM | POA: Diagnosis not present

## 2022-02-03 DIAGNOSIS — Z0000 Encounter for general adult medical examination without abnormal findings: Secondary | ICD-10-CM | POA: Diagnosis not present

## 2022-02-03 DIAGNOSIS — Z1159 Encounter for screening for other viral diseases: Secondary | ICD-10-CM | POA: Diagnosis not present

## 2022-03-11 DIAGNOSIS — E782 Mixed hyperlipidemia: Secondary | ICD-10-CM | POA: Diagnosis not present

## 2022-03-11 DIAGNOSIS — N529 Male erectile dysfunction, unspecified: Secondary | ICD-10-CM | POA: Diagnosis not present

## 2022-03-11 DIAGNOSIS — I10 Essential (primary) hypertension: Secondary | ICD-10-CM | POA: Diagnosis not present

## 2022-03-11 DIAGNOSIS — J309 Allergic rhinitis, unspecified: Secondary | ICD-10-CM | POA: Diagnosis not present

## 2022-03-11 DIAGNOSIS — E1165 Type 2 diabetes mellitus with hyperglycemia: Secondary | ICD-10-CM | POA: Diagnosis not present

## 2022-04-10 ENCOUNTER — Other Ambulatory Visit: Payer: Self-pay | Admitting: Hematology and Oncology

## 2022-04-10 DIAGNOSIS — C8442 Peripheral T-cell lymphoma, not classified, intrathoracic lymph nodes: Secondary | ICD-10-CM

## 2022-04-11 ENCOUNTER — Other Ambulatory Visit: Payer: Self-pay

## 2022-04-11 ENCOUNTER — Inpatient Hospital Stay: Payer: Medicare PPO | Attending: Hematology and Oncology

## 2022-04-11 ENCOUNTER — Inpatient Hospital Stay (HOSPITAL_BASED_OUTPATIENT_CLINIC_OR_DEPARTMENT_OTHER): Payer: Medicare PPO | Admitting: Hematology and Oncology

## 2022-04-11 VITALS — BP 130/74 | HR 100 | Temp 97.7°F | Resp 17 | Wt 245.5 lb

## 2022-04-11 DIAGNOSIS — G4733 Obstructive sleep apnea (adult) (pediatric): Secondary | ICD-10-CM | POA: Insufficient documentation

## 2022-04-11 DIAGNOSIS — I10 Essential (primary) hypertension: Secondary | ICD-10-CM | POA: Diagnosis not present

## 2022-04-11 DIAGNOSIS — Z79899 Other long term (current) drug therapy: Secondary | ICD-10-CM | POA: Insufficient documentation

## 2022-04-11 DIAGNOSIS — C8499 Mature T/NK-cell lymphomas, unspecified, extranodal and solid organ sites: Secondary | ICD-10-CM

## 2022-04-11 DIAGNOSIS — E119 Type 2 diabetes mellitus without complications: Secondary | ICD-10-CM | POA: Diagnosis not present

## 2022-04-11 DIAGNOSIS — Z8572 Personal history of non-Hodgkin lymphomas: Secondary | ICD-10-CM | POA: Insufficient documentation

## 2022-04-11 DIAGNOSIS — N529 Male erectile dysfunction, unspecified: Secondary | ICD-10-CM | POA: Insufficient documentation

## 2022-04-11 DIAGNOSIS — Z95828 Presence of other vascular implants and grafts: Secondary | ICD-10-CM

## 2022-04-11 DIAGNOSIS — C8442 Peripheral T-cell lymphoma, not classified, intrathoracic lymph nodes: Secondary | ICD-10-CM

## 2022-04-11 DIAGNOSIS — E785 Hyperlipidemia, unspecified: Secondary | ICD-10-CM | POA: Diagnosis not present

## 2022-04-11 DIAGNOSIS — F17210 Nicotine dependence, cigarettes, uncomplicated: Secondary | ICD-10-CM | POA: Diagnosis not present

## 2022-04-11 LAB — CMP (CANCER CENTER ONLY)
ALT: 16 U/L (ref 0–44)
AST: 16 U/L (ref 15–41)
Albumin: 4 g/dL (ref 3.5–5.0)
Alkaline Phosphatase: 101 U/L (ref 38–126)
Anion gap: 6 (ref 5–15)
BUN: 19 mg/dL (ref 8–23)
CO2: 24 mmol/L (ref 22–32)
Calcium: 9.1 mg/dL (ref 8.9–10.3)
Chloride: 108 mmol/L (ref 98–111)
Creatinine: 1.12 mg/dL (ref 0.61–1.24)
GFR, Estimated: >60 mL/min (ref >60)
Glucose, Bld: 164 mg/dL — ABNORMAL HIGH (ref 70–99)
Potassium: 3.7 mmol/L (ref 3.5–5.1)
Sodium: 138 mmol/L (ref 135–145)
Total Bilirubin: 0.3 mg/dL (ref 0.3–1.2)
Total Protein: 6.7 g/dL (ref 6.5–8.1)

## 2022-04-11 LAB — CBC WITH DIFFERENTIAL (CANCER CENTER ONLY)
Abs Immature Granulocytes: 0.02 10*3/uL (ref 0.00–0.07)
Basophils Absolute: 0.1 10*3/uL (ref 0.0–0.1)
Basophils Relative: 1 %
Eosinophils Absolute: 0.3 10*3/uL (ref 0.0–0.5)
Eosinophils Relative: 6 %
HCT: 45.3 % (ref 39.0–52.0)
Hemoglobin: 15.4 g/dL (ref 13.0–17.0)
Immature Granulocytes: 0 %
Lymphocytes Relative: 35 %
Lymphs Abs: 1.9 10*3/uL (ref 0.7–4.0)
MCH: 30 pg (ref 26.0–34.0)
MCHC: 34 g/dL (ref 30.0–36.0)
MCV: 88.1 fL (ref 80.0–100.0)
Monocytes Absolute: 0.6 10*3/uL (ref 0.1–1.0)
Monocytes Relative: 10 %
Neutro Abs: 2.5 10*3/uL (ref 1.7–7.7)
Neutrophils Relative %: 48 %
Platelet Count: 194 10*3/uL (ref 150–400)
RBC: 5.14 MIL/uL (ref 4.22–5.81)
RDW: 15.1 % (ref 11.5–15.5)
WBC Count: 5.4 10*3/uL (ref 4.0–10.5)
nRBC: 0 % (ref 0.0–0.2)

## 2022-04-11 LAB — LACTATE DEHYDROGENASE: LDH: 140 U/L (ref 98–192)

## 2022-04-11 NOTE — Progress Notes (Signed)
[place] [phone]   [phone]  PROGRESS NOTE  Patient Care Team: Fanny Bien, MD as PCP - General (Family Medicine)  Hematological/Oncological History # CD30-Positive T-cell Lymphoma, Stage III/IV 07/09/2020: patient underwent cholecystectomy. Liver wedge biopsy during the procedure revealed involvement of a CD30-positive T-cell lymphoproliferative disorder 07/25/2020: establish care with Dr. Lorenso Courier  08/08/2020: PET CT scan showed innumerable hypermetabolic lesions (primarily Deauville 4) in the liver and skeleton. Left neck adenopathy including a dominant level IB lymph node which is Deauville 5. 08/15/2020: Cycle 1 Day 1 of BV-CHP  09/04/2020: Cycle 2 Day 1 of BV-CHP 09/27/2020:  Cycle 3 Day 1 of BV-CHP 10/18/2020: Cycle 4 Day 1 of BV-CHP 11/08/2020: Cycle 5 Day 1 of BV-CHP 11/29/2020: Cycle 6 Day 1 of BV-CHP 12/26/2020: PET CT scan showed further improvement compared to the 11/05/2020 exam, with mild reduction in activity in the extensive scattered mixed density skeletal lesions (although still Deauville 4) and no substantial hypermetabolic adenopathy currently identified 02/11/2021: Bone marrow biopsy performed due to concern for FDG avidity of bone marrow.  No evidence of residual disease noted on bone marrow biopsy.  Interval History:  Maurice Fox 72 y.o. male with medical history significant for T cell lymphoma (Stage III/IV) presents for a follow up visit. The patient's last visit was on 01/10/2022.  In the interim since that time he has had no major changes in his health.  On exam today Maurice Fox reports he has been well overall in the interim since our last visit 3 months ago.  He notes he is had no major changes in his health though his appetite is not quite as good.  He reports that he is unsure why he eats less.  He thinks it may be related to his gallbladder issues.  He reports that he just does not have the desire to eat on a  regular basis.  His weight has increased in the interval since our last visit.  He reports that his energy is "so-so" and currently about a 6 out of 10.  He thinks this is due to the fact that he works approximately 3 nights per week.  He notes he does not have any bumps or lumps concerning for lymphadenopathy.  He checks himself for his lymph nodes approximately once per month.  He notes that he is not having any trouble with signs or symptoms concerning for recurrence.  Patient denies any fevers, chills, shortness of breath, chest pain or constipation. He has no other complaints.  A full 10 point ROS is listed below.  MEDICAL HISTORY:  Past Medical History:  Diagnosis Date   Arthritis    Hyperlipidemia    Hypertension    followed by pcp  (11-23-2019  per pt had stress test greater than 20 yrs ago, told ok)   Nocturia    OSA on CPAP    per pt uses nightly   Phimosis    Type 2 diabetes mellitus ([place])    followed by pcp   (11-23-2019 per pt does not check blood sugar at home)   Wears glasses     SURGICAL HISTORY: Past Surgical History:  Procedure Laterality Date   APPENDECTOMY  child   CHOLECYSTECTOMY N/A 07/09/2020   Procedure: LAPAROSCOPIC CHOLECYSTECTOMY WITH INTRAOPERATIVE CHOLANGIOGRAM,;  Surgeon: Greer Pickerel, MD;  Location: [facility];  Service: General;  Laterality: N/A;   CIRCUMCISION N/A 11/29/2019   Procedure: CIRCUMCISION ADULT;  Surgeon: [name], MD;  Location: [facility];  Service: Urology;  Laterality: N/A;   IR IMAGING GUIDED PORT INSERTION  08/06/2020   IR REMOVAL TUN ACCESS W/ PORT W/O FL MOD SED  04/09/2021   LIVER BIOPSY N/A 07/09/2020   Procedure: LAP LIVER BIOPSY;  Surgeon: Greer Pickerel, MD;  Location: [facility];  Service: General;  Laterality: N/A;   ROTATOR CUFF REPAIR Left chld   SUPRA-UMBILICAL HERNIA N/A [id number]   Procedure: PRIMARY REPAIR SUPRA-UMBILICAL HERNIA;  Surgeon: Greer Pickerel, MD;  Location: [facility];  Service: General;  Laterality:  N/A;    SOCIAL HISTORY: Social History   Socioeconomic History   Marital status: Divorced    Spouse name: Not on file   Number of children: Not on file   Years of education: Not on file   Highest education level: Not on file  Occupational History   Not on file  Tobacco Use   Smoking status: Every Day    Packs/day: 1.00    Years: 49.00    Additional pack years: 0.00    Total pack years: 49.00    Types: Cigars, Cigarettes   Smokeless tobacco: Never  Vaping Use   Vaping Use: Never used  Substance and Sexual Activity   Alcohol use: Not Currently   Drug use: Never   Sexual activity: Not on file  Other Topics Concern   Not on file  Social History Narrative   Not on file   Social Determinants of Health   Financial Resource Strain: Not on file  Food Insecurity: Not on file  Transportation Needs: Not on file  Physical Activity: Not on file  Stress: Not on file  Social Connections: Not on file  Intimate Partner Violence: Not on file    FAMILY HISTORY: Family History  Problem Relation Age of Onset   Hypotension Mother    Diabetes Mellitus II Father    Prostate cancer Brother    Prostate cancer Brother     ALLERGIES:  has No Known Allergies.  MEDICATIONS:  Current Outpatient Medications  Medication Sig Dispense Refill   amLODipine (NORVASC) 5 MG tablet 1 tablet     aspirin 81 MG EC tablet 1 tablet     atorvastatin (LIPITOR) 10 MG tablet Take 1 tablet by mouth daily.     azelastine (ASTELIN) 0.1 % nasal spray 1 puff in each nostril     fluticasone (FLONASE) 50 MCG/ACT nasal spray Place 2 sprays into both nostrils in the morning and at bedtime.     Multiple Vitamins-Minerals (CENTRUM SILVER PO) Take 1 tablet by mouth at bedtime.     Propylene Glycol (SYSTANE BALANCE OP) Place 1 drop into both eyes 2 (two) times daily.     RYBELSUS 7 MG TABS      sildenafil (VIAGRA) 100 MG tablet Take 100 mg by mouth daily as needed (ED).     tadalafil (CIALIS) 5 MG tablet Take 1  tablet (5 mg total) by mouth daily as needed for erectile dysfunction. 10 tablet 0   valsartan-hydrochlorothiazide (DIOVAN-HCT) 320-25 MG tablet Take 1 tablet by mouth daily.     diclofenac Sodium (VOLTAREN) 1 % GEL Apply 2 g topically 4 (four) times daily as needed for pain. (Patient not taking: Reported on 04/11/2022)     tamsulosin (FLOMAX) 0.4 MG CAPS capsule Take 0.4 mg by mouth at bedtime. (Patient not taking: Reported on 04/11/2022)     No current facility-administered medications for this visit.    REVIEW OF SYSTEMS:   Constitutional: ( - ) fevers, ( - )  chills , ( - ) night sweats Eyes: ( - ) blurriness of vision, ( - ) double vision, ( - ) watery eyes Ears, nose, mouth, throat, and face: ( - ) mucositis, ( - ) sore throat Respiratory: ( - ) cough, ( - ) dyspnea, ( - ) wheezes Cardiovascular: ( - ) palpitation, ( - ) chest discomfort, ( - ) lower extremity swelling Gastrointestinal:  ( - ) nausea, ( - ) heartburn, ( - ) change in bowel habits Skin: ( - ) abnormal skin rashes Lymphatics: ( - ) new lymphadenopathy, ( - ) easy bruising Neurological: ( - ) numbness, ( - ) tingling, ( - ) new weaknesses Behavioral/Psych: ( - ) mood change, ( - ) new changes  All other systems were reviewed with the patient and are negative.  PHYSICAL EXAMINATION: ECOG PERFORMANCE STATUS: 1 - Symptomatic but completely ambulatory  Vitals:   04/11/22 1349  BP: 130/74  Pulse: 100  Resp: 17  Temp: 97.7 F (36.5 C)  SpO2: 97%     Filed Weights   04/11/22 1349  Weight: 245 lb 8 oz (111.4 kg)     GENERAL: Well-appearing elderly African-American male, alert, no distress and comfortable SKIN: skin color, texture, turgor are normal, no rashes or significant lesions EYES: conjunctiva are pink and non-injected, sclera clear NECK: supple, non-tender LYMPH: resolved left submandibular lymph node, otherwise no palpable lymphadenopathy in the cervical, axillary or inguinal LUNGS: clear to  auscultation and percussion with normal breathing effort HEART: regular rate & rhythm and no murmurs and no lower extremity edema PSYCH: alert & oriented x 3, fluent speech NEURO: no focal motor/sensory deficits  LABORATORY DATA:  I have reviewed the data as listed    Latest Ref Rng & Units 04/11/2022    1:36 PM 01/10/2022    7:38 AM 10/04/2021    1:39 PM  CBC  WBC 4.0 - 10.5 K/uL 5.4  6.1  5.3   Hemoglobin 13.0 - 17.0 g/dL 15.4  16.2  15.3   Hematocrit 39.0 - 52.0 % 45.3  47.3  46.1   Platelets 150 - 400 K/uL 194  185  184        Latest Ref Rng & Units 04/11/2022    1:36 PM 01/10/2022    7:38 AM 01/02/2022    5:50 PM  CMP  Glucose 70 - 99 mg/dL 164  129    BUN 8 - 23 mg/dL 19  25    Creatinine 0.61 - 1.24 mg/dL 1.12  1.21  1.20   Sodium 135 - 145 mmol/L 138  140    Potassium 3.5 - 5.1 mmol/L 3.7  3.4    Chloride 98 - 111 mmol/L 108  106    CO2 22 - 32 mmol/L 24  27    Calcium 8.9 - 10.3 mg/dL 9.1  9.3    Total Protein 6.5 - 8.1 g/dL 6.7  7.0    Total Bilirubin 0.3 - 1.2 mg/dL 0.3  0.5    Alkaline Phos 38 - 126 U/L 101  89    AST 15 - 41 U/L 16  21    ALT 0 - 44 U/L 16  25     RADIOGRAPHIC STUDIES: No images were reviewed during this visit.   ASSESSMENT & PLAN ELIAM THILGES 72 y.o. male with medical history significant for T cell lymphoma (Stage III/IV) presents for a follow up visit.  After review of the labs, review of the records, and discussion with  the patient the patients findings are most consistent with a peripheral CD30 positive T-cell lymphoma.  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  The patient has been staged with a PET CT scan which showed clear involvement of the skeleton and liver, most consistent with stage IV disease.  Given these findings we will plan for 6 cycles of BV CHP chemotherapy.  The regimen of BV CHP consists of brentuximab 1.8 mg/kg IV on day 1, cyclophosphamide 750 mg per metered squared on day  1, doxorubicin 50 mg per metered squared on day 1, and prednisone 60 mg p.o. day 1 through 5.  This is to be continued every 21 days with a plan for up to 6 cycles.  After the third or fourth cycle we will consider restaging PET CT scan in order to evaluate for response.  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 --TTE performed 07/08/2020, shows EF of 50-55% --negative HIV, Hep B and C serologies. --GCSF support on Day 3 of each cycle.  -LDH, Liver enzymes and Alk Phos are improving. Patient will proceed with treatment today.  --PET CT scan performed on 11/05/2020, findings show Deauville 2 disease with some Deauville 4 noted in the skeleton. Post treatment PET after the last cycle showed diminished FDG avidity, but still Deauville 4.  -- Bone marrow biopsy on 02/11/2021 shows no evidence of residual disease. Plan: -- Per NCCN recommendations we will have the patient return to clinic every 3 to 6 months with imaging on a 66-month basis for the first 2 years --Labs at each clinic visit to include CBC, CMP, LDH.  Labs today show white blood cell count 5.4, hemoglobin 15.4, MCV 88.1, and platelets of 194 --last CT scan on 01/02/2022, no evidence of residual/recurrent disease. Next due June 2024.  --Return to clinic in 3 months time with CT imaging   #Erectile Dysfunction -- Patient notes Viagra is not working for him.  We provided prescription for Cialis.  If this remains ineffective would make referral to urology.  #Supportive Care -- port removed 04/09/2021  Orders Placed This Encounter  Procedures   CT CHEST ABDOMEN PELVIS W CONTRAST    Standing Status:   Future    Standing Expiration Date:   04/11/2023    Order Specific Question:   If indicated for the ordered procedure, I authorize the administration of contrast media per  Radiology protocol    Answer:   Yes    Order Specific Question:   Does the patient have a contrast media/X-ray dye allergy?    Answer:   No    Order Specific Question:   Preferred imaging location?    Answer:   [facility]    Order Specific Question:   Is Oral Contrast requested for this exam?    Answer:   Yes, Per Radiology protocol   All questions were answered. The patient knows to call the clinic with any problems, questions or concerns.  A total of more than 30 minutes were spent on this encounter with face-to-face time and non-face-to-face time, including preparing to see the patient, ordering medications, counseling the patient and coordination of care as outlined above.   Ledell Peoples, MD Department of Hematology/Oncology [place] at [facility] Phone: [phone] Pager: [phone] Email: [email or web address]  04/11/2022 2:40 PM

## 2022-04-14 ENCOUNTER — Telehealth: Payer: Self-pay | Admitting: Hematology and Oncology

## 2022-04-14 NOTE — Telephone Encounter (Signed)
Per 3/2 LOS reached out to patient to schedule.

## 2022-04-15 DIAGNOSIS — Z720 Tobacco use: Secondary | ICD-10-CM | POA: Diagnosis not present

## 2022-04-15 DIAGNOSIS — I10 Essential (primary) hypertension: Secondary | ICD-10-CM | POA: Diagnosis not present

## 2022-04-15 DIAGNOSIS — E782 Mixed hyperlipidemia: Secondary | ICD-10-CM | POA: Diagnosis not present

## 2022-04-15 DIAGNOSIS — E1165 Type 2 diabetes mellitus with hyperglycemia: Secondary | ICD-10-CM | POA: Diagnosis not present

## 2022-04-29 DIAGNOSIS — Z23 Encounter for immunization: Secondary | ICD-10-CM | POA: Diagnosis not present

## 2022-06-12 DIAGNOSIS — I10 Essential (primary) hypertension: Secondary | ICD-10-CM | POA: Diagnosis not present

## 2022-06-12 DIAGNOSIS — E1165 Type 2 diabetes mellitus with hyperglycemia: Secondary | ICD-10-CM | POA: Diagnosis not present

## 2022-06-12 DIAGNOSIS — E559 Vitamin D deficiency, unspecified: Secondary | ICD-10-CM | POA: Diagnosis not present

## 2022-07-03 DIAGNOSIS — E1165 Type 2 diabetes mellitus with hyperglycemia: Secondary | ICD-10-CM | POA: Diagnosis not present

## 2022-07-03 DIAGNOSIS — C8590 Non-Hodgkin lymphoma, unspecified, unspecified site: Secondary | ICD-10-CM | POA: Diagnosis not present

## 2022-07-03 DIAGNOSIS — I10 Essential (primary) hypertension: Secondary | ICD-10-CM | POA: Diagnosis not present

## 2022-07-11 ENCOUNTER — Ambulatory Visit (HOSPITAL_COMMUNITY)
Admission: RE | Admit: 2022-07-11 | Discharge: 2022-07-11 | Disposition: A | Discharge status: Home / Self Care | Payer: Medicare PPO | Source: Ambulatory Visit | Attending: Hematology and Oncology | Admitting: Hematology and Oncology

## 2022-07-11 DIAGNOSIS — N2889 Other specified disorders of kidney and ureter: Secondary | ICD-10-CM | POA: Diagnosis not present

## 2022-07-11 DIAGNOSIS — N289 Disorder of kidney and ureter, unspecified: Secondary | ICD-10-CM | POA: Diagnosis not present

## 2022-07-11 DIAGNOSIS — I700 Atherosclerosis of aorta: Secondary | ICD-10-CM | POA: Insufficient documentation

## 2022-07-11 DIAGNOSIS — R918 Other nonspecific abnormal finding of lung field: Secondary | ICD-10-CM | POA: Diagnosis not present

## 2022-07-11 DIAGNOSIS — C969 Malignant neoplasm of lymphoid, hematopoietic and related tissue, unspecified: Secondary | ICD-10-CM | POA: Diagnosis not present

## 2022-07-11 DIAGNOSIS — E278 Other specified disorders of adrenal gland: Secondary | ICD-10-CM | POA: Insufficient documentation

## 2022-07-11 DIAGNOSIS — C8442 Peripheral T-cell lymphoma, not classified, intrathoracic lymph nodes: Secondary | ICD-10-CM

## 2022-07-11 DIAGNOSIS — E049 Nontoxic goiter, unspecified: Secondary | ICD-10-CM | POA: Insufficient documentation

## 2022-07-11 LAB — POCT I-STAT CREATININE: Creatinine, Ser: 1.3 mg/dL — ABNORMAL HIGH (ref 0.61–1.24)

## 2022-07-11 MED ORDER — SODIUM CHLORIDE (PF) 0.9 % IJ SOLN
INTRAMUSCULAR | Status: AC
  Filled 2022-07-11: qty 50

## 2022-07-11 MED ORDER — IOHEXOL 9 MG/ML PO SOLN
ORAL | Status: AC
  Filled 2022-07-11: qty 1000

## 2022-07-11 MED ORDER — IOHEXOL 300 MG/ML  SOLN
100.0000 mL | Freq: Once | INTRAMUSCULAR | Status: AC | PRN
  Administered 2022-07-11: 100 mL via INTRAVENOUS

## 2022-07-11 MED ORDER — IOHEXOL 9 MG/ML PO SOLN
1000.0000 mL | ORAL | Status: AC
  Administered 2022-07-11: 1000 mL via ORAL

## 2022-07-15 ENCOUNTER — Telehealth: Payer: Self-pay

## 2022-07-15 NOTE — Telephone Encounter (Signed)
Pt advised with VU and confirmed 6/28 appt.

## 2022-07-15 NOTE — Telephone Encounter (Signed)
-----   Message from Phillis Knack, RN sent at 07/15/2022  8:44 AM EDT -----  ----- Message ----- From: Kyra Searles, RN Sent: 07/14/2022   5:07 PM EDT To: Phillis Knack, RN   ----- Message ----- From: Jaci Standard, MD Sent: 07/14/2022  10:04 AM EDT To: Kyra Searles, RN  Please let Maurice Fox know that his CT scan did not show any evidence of residual or recurrent lymphoma.  Will plan to see him back as scheduled on 07/18/2022. ----- Message ----- From: Interface, Rad Results In Sent: 07/12/2022  10:14 AM EDT To: Jaci Standard, MD

## 2022-07-18 ENCOUNTER — Inpatient Hospital Stay (HOSPITAL_BASED_OUTPATIENT_CLINIC_OR_DEPARTMENT_OTHER): Payer: Medicare PPO | Admitting: Hematology and Oncology

## 2022-07-18 ENCOUNTER — Other Ambulatory Visit: Payer: Self-pay | Admitting: *Deleted

## 2022-07-18 ENCOUNTER — Other Ambulatory Visit: Payer: Self-pay

## 2022-07-18 ENCOUNTER — Inpatient Hospital Stay: Payer: Medicare PPO | Attending: Hematology and Oncology

## 2022-07-18 VITALS — BP 131/77 | HR 112 | Temp 98.1°F | Resp 17 | Wt 244.4 lb

## 2022-07-18 DIAGNOSIS — C8442 Peripheral T-cell lymphoma, not classified, intrathoracic lymph nodes: Secondary | ICD-10-CM

## 2022-07-18 DIAGNOSIS — Z9221 Personal history of antineoplastic chemotherapy: Secondary | ICD-10-CM | POA: Insufficient documentation

## 2022-07-18 DIAGNOSIS — Z8572 Personal history of non-Hodgkin lymphomas: Secondary | ICD-10-CM | POA: Diagnosis not present

## 2022-07-18 DIAGNOSIS — N529 Male erectile dysfunction, unspecified: Secondary | ICD-10-CM | POA: Insufficient documentation

## 2022-07-18 LAB — CMP (CANCER CENTER ONLY)
ALT: 19 U/L (ref 0–44)
AST: 18 U/L (ref 15–41)
Albumin: 3.8 g/dL (ref 3.5–5.0)
Alkaline Phosphatase: 85 U/L (ref 38–126)
Anion gap: 6 (ref 5–15)
BUN: 19 mg/dL (ref 8–23)
CO2: 27 mmol/L (ref 22–32)
Calcium: 9.7 mg/dL (ref 8.9–10.3)
Chloride: 106 mmol/L (ref 98–111)
Creatinine: 1.19 mg/dL (ref 0.61–1.24)
GFR, Estimated: >60 mL/min (ref >60)
Glucose, Bld: 123 mg/dL — ABNORMAL HIGH (ref 70–99)
Potassium: 3.9 mmol/L (ref 3.5–5.1)
Sodium: 139 mmol/L (ref 135–145)
Total Bilirubin: 0.5 mg/dL (ref 0.3–1.2)
Total Protein: 7.2 g/dL (ref 6.5–8.1)

## 2022-07-18 LAB — CBC WITH DIFFERENTIAL (CANCER CENTER ONLY)
Abs Immature Granulocytes: 0.01 10*3/uL (ref 0.00–0.07)
Basophils Absolute: 0.1 10*3/uL (ref 0.0–0.1)
Basophils Relative: 1 %
Eosinophils Absolute: 0.2 10*3/uL (ref 0.0–0.5)
Eosinophils Relative: 4 %
HCT: 46.1 % (ref 39.0–52.0)
Hemoglobin: 15.5 g/dL (ref 13.0–17.0)
Immature Granulocytes: 0 %
Lymphocytes Relative: 31 %
Lymphs Abs: 1.7 10*3/uL (ref 0.7–4.0)
MCH: 30.2 pg (ref 26.0–34.0)
MCHC: 33.6 g/dL (ref 30.0–36.0)
MCV: 89.7 fL (ref 80.0–100.0)
Monocytes Absolute: 0.5 10*3/uL (ref 0.1–1.0)
Monocytes Relative: 9 %
Neutro Abs: 3.1 10*3/uL (ref 1.7–7.7)
Neutrophils Relative %: 55 %
Platelet Count: 259 10*3/uL (ref 150–400)
RBC: 5.14 MIL/uL (ref 4.22–5.81)
RDW: 14.3 % (ref 11.5–15.5)
WBC Count: 5.6 10*3/uL (ref 4.0–10.5)
nRBC: 0 % (ref 0.0–0.2)

## 2022-07-18 LAB — LACTATE DEHYDROGENASE: LDH: 142 U/L (ref 98–192)

## 2022-07-18 NOTE — Progress Notes (Signed)
[facility] [phone]   [phone]  PROGRESS NOTE  Patient Care Team: Lewis Moccasin, MD as PCP - General (Family Medicine)  Hematological/Oncological History # CD30-Positive T-cell Lymphoma, Stage III/IV 07/09/2020: patient underwent cholecystectomy. Liver wedge biopsy during the procedure revealed involvement of a CD30-positive T-cell lymphoproliferative disorder 07/25/2020: establish care with Dr. Leonides Schanz  08/08/2020: PET CT scan showed innumerable hypermetabolic lesions (primarily Deauville 4) in the liver and skeleton. Left neck adenopathy including a dominant level IB lymph node which is Deauville 5. 08/15/2020: Cycle 1 Day 1 of BV-CHP  09/04/2020: Cycle 2 Day 1 of BV-CHP 09/27/2020:  Cycle 3 Day 1 of BV-CHP 10/18/2020: Cycle 4 Day 1 of BV-CHP 11/08/2020: Cycle 5 Day 1 of BV-CHP 11/29/2020: Cycle 6 Day 1 of BV-CHP 12/26/2020: PET CT scan showed further improvement compared to the 11/05/2020 exam, with mild reduction in activity in the extensive scattered mixed density skeletal lesions (although still Deauville 4) and no substantial hypermetabolic adenopathy currently identified 02/11/2021: Bone marrow biopsy performed due to concern for FDG avidity of bone marrow.  No evidence of residual disease noted on bone marrow biopsy.  Interval History:  Maurice Fox 72 y.o. male with medical history significant for T cell lymphoma (Stage III/IV) presents for a follow up visit. The patient's last visit was on 04/11/2022.  In the interim since that time he has had no major changes in his health.  On exam today Maurice Fox reports he has been well overall in the interim since her last visit.  He does still have some occasional tingling in his toes.  He is not having any unsteadiness, tripping, or falling.  He reports his appetite has declined and the typically he does not complete his whole dinner and saves it for the next day as leftovers.  He notes that his  weight has declined slightly from a recent elevation, but based on her labs his weight has increased.  He notes he did have a dental abscess which may have affected his appetite.  He notes that he is not having any issues with infectious symptoms such as cough, runny nose, or sore throat.  Overall he feels well with no questions concerns or complaints today.  Patient denies any fevers, chills, shortness of breath, chest pain or constipation. He has no other complaints.  A full 10 point ROS is listed below.  MEDICAL HISTORY:  Past Medical History:  Diagnosis Date   Arthritis    Hyperlipidemia    Hypertension    followed by pcp  (11-23-2019  per pt had stress test greater than 20 yrs ago, told ok)   Nocturia    OSA on CPAP    per pt uses nightly   Phimosis    Type 2 diabetes mellitus (HCC)    followed by pcp   (11-23-2019 per pt does not check blood sugar at home)   Wears glasses     SURGICAL HISTORY: Past Surgical History:  Procedure Laterality Date   APPENDECTOMY  child   CHOLECYSTECTOMY N/A 07/09/2020   Procedure: LAPAROSCOPIC CHOLECYSTECTOMY WITH INTRAOPERATIVE CHOLANGIOGRAM,;  Surgeon: [name], MD;  Location: [name];  Service: General;  Laterality: N/A;   CIRCUMCISION N/A 11/29/2019   Procedure: CIRCUMCISION ADULT;  Surgeon: [name], MD;  Location: [facility];  Service: Urology;  Laterality: N/A;   IR IMAGING GUIDED PORT INSERTION  08/06/2020   IR REMOVAL TUN ACCESS W/ PORT W/O FL MOD SED  04/09/2021   LIVER  BIOPSY N/A 07/09/2020   Procedure: LAP LIVER BIOPSY;  Surgeon: [name], MD;  Location: [facility];  Service: General;  Laterality: N/A;   ROTATOR CUFF REPAIR Left chld   SUPRA-UMBILICAL HERNIA N/A 07/09/2020   Procedure: PRIMARY REPAIR SUPRA-UMBILICAL HERNIA;  Surgeon: [name], MD;  Location: [facility];  Service: General;  Laterality: N/A;    SOCIAL HISTORY: Social History   Socioeconomic History   Marital status: Divorced    Spouse name: Not  on file   Number of children: Not on file   Years of education: Not on file   Highest education level: Not on file  Occupational History   Not on file  Tobacco Use   Smoking status: Every Day    Packs/day: 1.00    Years: 49.00    Additional pack years: 0.00    Total pack years: 49.00    Types: Cigars, Cigarettes   Smokeless tobacco: Never  Vaping Use   Vaping Use: Never used  Substance and Sexual Activity   Alcohol use: Not Currently   Drug use: Never   Sexual activity: Not on file  Other Topics Concern   Not on file  Social History Narrative   Not on file   Social Determinants of Health   Financial Resource Strain: Not on file  Food Insecurity: Not on file  Transportation Needs: Not on file  Physical Activity: Not on file  Stress: Not on file  Social Connections: Not on file  Intimate Partner Violence: Not on file    FAMILY HISTORY: Family History  Problem Relation Age of Onset   Hypotension Mother    Diabetes Mellitus II Father    Prostate cancer Brother    Prostate cancer Brother     ALLERGIES:  has No Known Allergies.  MEDICATIONS:  Current Outpatient Medications  Medication Sig Dispense Refill   amLODipine (NORVASC) 5 MG tablet 1 tablet     aspirin 81 MG EC tablet 1 tablet     atorvastatin (LIPITOR) 10 MG tablet Take 1 tablet by mouth daily.     azelastine (ASTELIN) 0.1 % nasal spray 1 puff in each nostril     diclofenac Sodium (VOLTAREN) 1 % GEL Apply 2 g topically 4 (four) times daily as needed for pain. (Patient not taking: Reported on 04/11/2022)     fluticasone (FLONASE) 50 MCG/ACT nasal spray Place 2 sprays into both nostrils in the morning and at bedtime.     Multiple Vitamins-Minerals (CENTRUM SILVER PO) Take 1 tablet by mouth at bedtime.     Propylene Glycol (SYSTANE BALANCE OP) Place 1 drop into both eyes 2 (two) times daily.     RYBELSUS 7 MG TABS      sildenafil (VIAGRA) 100 MG tablet Take 100 mg by mouth daily as needed (ED).     tadalafil  (CIALIS) 5 MG tablet Take 1 tablet (5 mg total) by mouth daily as needed for erectile dysfunction. 10 tablet 0   tamsulosin (FLOMAX) 0.4 MG CAPS capsule Take 0.4 mg by mouth at bedtime. (Patient not taking: Reported on 04/11/2022)     valsartan-hydrochlorothiazide (DIOVAN-HCT) 320-25 MG tablet Take 1 tablet by mouth daily.     No current facility-administered medications for this visit.    REVIEW OF SYSTEMS:   Constitutional: ( - ) fevers, ( - )  chills , ( - ) night sweats Eyes: ( - ) blurriness of vision, ( - ) double vision, ( - ) watery eyes Ears, nose, mouth, throat, and face: ( - )  mucositis, ( - ) sore throat Respiratory: ( - ) cough, ( - ) dyspnea, ( - ) wheezes Cardiovascular: ( - ) palpitation, ( - ) chest discomfort, ( - ) lower extremity swelling Gastrointestinal:  ( - ) nausea, ( - ) heartburn, ( - ) change in bowel habits Skin: ( - ) abnormal skin rashes Lymphatics: ( - ) new lymphadenopathy, ( - ) easy bruising Neurological: ( - ) numbness, ( - ) tingling, ( - ) new weaknesses Behavioral/Psych: ( - ) mood change, ( - ) new changes  All other systems were reviewed with the patient and are negative.  PHYSICAL EXAMINATION: ECOG PERFORMANCE STATUS: 1 - Symptomatic but completely ambulatory  Vitals:   07/18/22 0935  BP: 131/77  Pulse: (!) 112  Resp: 17  Temp: 98.1 F (36.7 C)  SpO2: 96%      Filed Weights   07/18/22 0935  Weight: 244 lb 6.4 oz (110.9 kg)      GENERAL: Well-appearing elderly African-American male, alert, no distress and comfortable SKIN: skin color, texture, turgor are normal, no rashes or significant lesions EYES: conjunctiva are pink and non-injected, sclera clear NECK: supple, non-tender LYMPH: resolved left submandibular lymph node, otherwise no palpable lymphadenopathy in the cervical, axillary or inguinal LUNGS: clear to auscultation and percussion with normal breathing effort HEART: regular rate & rhythm and no murmurs and no lower  extremity edema PSYCH: alert & oriented x 3, fluent speech NEURO: no focal motor/sensory deficits  LABORATORY DATA:  I have reviewed the data as listed    Latest Ref Rng & Units 07/18/2022    9:23 AM 04/11/2022    1:36 PM 01/10/2022    7:38 AM  CBC  WBC 4.0 - 10.5 K/uL 5.6  5.4  6.1   Hemoglobin 13.0 - 17.0 g/dL 11.9  14.7  82.9   Hematocrit 39.0 - 52.0 % 46.1  45.3  47.3   Platelets 150 - 400 K/uL 259  194  185        Latest Ref Rng & Units 07/18/2022    9:23 AM 07/11/2022   10:19 AM 04/11/2022    1:36 PM  CMP  Glucose 70 - 99 mg/dL 562   130   BUN 8 - 23 mg/dL 19   19   Creatinine 8.65 - 1.24 mg/dL 7.84  6.96  2.95   Sodium 135 - 145 mmol/L 139   138   Potassium 3.5 - 5.1 mmol/L 3.9   3.7   Chloride 98 - 111 mmol/L 106   108   CO2 22 - 32 mmol/L 27   24   Calcium 8.9 - 10.3 mg/dL 9.7   9.1   Total Protein 6.5 - 8.1 g/dL 7.2   6.7   Total Bilirubin 0.3 - 1.2 mg/dL 0.5   0.3   Alkaline Phos 38 - 126 U/L 85   101   AST 15 - 41 U/L 18   16   ALT 0 - 44 U/L 19   16    RADIOGRAPHIC STUDIES: No images were reviewed during this visit.   ASSESSMENT & PLAN Maurice Fox 72 y.o. male with medical history significant for T cell lymphoma (Stage III/IV) presents for a follow up visit.  After review of the labs, review of the records, and discussion with the patient the patients findings are most consistent with a peripheral CD30 positive T-cell lymphoma.  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  The patient  has been staged with a PET CT scan which showed clear involvement of the skeleton and liver, most consistent with stage IV disease.  Given these findings we will plan for 6 cycles of BV CHP chemotherapy.  The regimen of BV CHP consists of brentuximab 1.8 mg/kg IV on day 1, cyclophosphamide 750 mg per metered squared on day 1, doxorubicin 50 mg per metered squared on day 1, and prednisone 60 mg p.o. day 1 through 5.  This is to be continued  every 21 days with a plan for up to 6 cycles.  After the third or fourth cycle we will consider restaging PET CT scan in order to evaluate for response.  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 --TTE performed 07/08/2020, shows EF of 50-55% --negative HIV, Hep B and C serologies. --GCSF support on Day 3 of each cycle.  -LDH, Liver enzymes and Alk Phos are improving. Patient will proceed with treatment today.  --PET CT scan performed on 11/05/2020, findings show Deauville 2 disease with some Deauville 4 noted in the skeleton. Post treatment PET after the last cycle showed diminished FDG avidity, but still Deauville 4.  -- Bone marrow biopsy on 02/11/2021 shows no evidence of residual disease. Plan: -- Per NCCN recommendations we will have the patient return to clinic every 3 to 6 months with imaging on a 63-month basis for the first 2 years --Labs at each clinic visit to include CBC, CMP, LDH.  Labs today show white blood cell count 5.6, Hgb 15.5, MCV 89.7, Plt 259 --last CT scan on 07/10/2021, no evidence of residual/recurrent disease. Next due Dec 2024. Will transition to yearly scans at that time.  --Return to clinic in 3 months time with CT imaging in 6 months.   #Erectile Dysfunction -- Patient notes Viagra is not working for him.  We provided prescription for Cialis.  If this remains ineffective would make referral to urology.  #Supportive Care -- port removed 04/09/2021  No orders of the defined types were placed in this encounter.  All questions were answered. The patient knows to call the clinic with any problems, questions or concerns.  A total of more than 30 minutes were spent on this encounter with face-to-face time and non-face-to-face time, including preparing to see the patient, ordering medications, counseling  the patient and coordination of care as outlined above.   Ulysees Barns, MD Department of Hematology/Oncology [facility] Phone: [phone] Pager: [phone] Email: [email or web address]  07/18/2022 10:23 AM

## 2022-07-21 ENCOUNTER — Telehealth: Payer: Self-pay | Admitting: Hematology and Oncology

## 2022-07-31 IMAGING — CT NM PET TUM IMG RESTAG (PS) SKULL BASE T - THIGH
7 series · 25 of 25 positions shown · non-contrast
Comparison: Multiple exams, including 11/05/2020

CLINICAL DATA: Subsequent treatment strategy for T-cell lymphoma.

EXAM:
NUCLEAR MEDICINE PET SKULL BASE TO THIGH
TECHNIQUE: 10.3 mCi F-18 FDG was injected intravenously. Full-ring PET imaging
was performed from the skull base to thigh after the radiotracer. CT
data was obtained and used for attenuation correction and anatomic
localization.
Fasting blood glucose: 128 mg/dl

[Series 3: pet sk_thigh ac · axial · 5.0mm · 4.07mm/px · z∈[-1478,-486]mm · 5 of 249 slices shown]
[im 1/249]
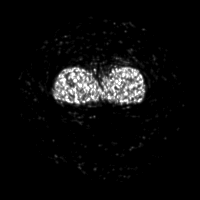
[im 63/249]
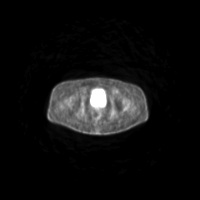
[im 125/249]
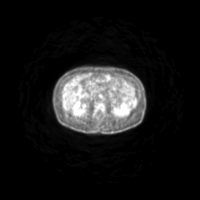
[im 187/249]
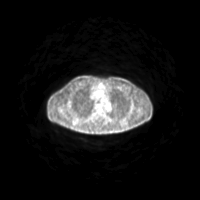
[im 249/249]
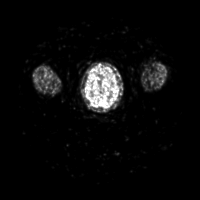

[Series 4: ct sk_thigh 5.0 bf37 · axial · 5.0mm · 0.98mm/px · z∈[-1478,-486]mm · 5 of 249 slices shown]
[im 1/249]
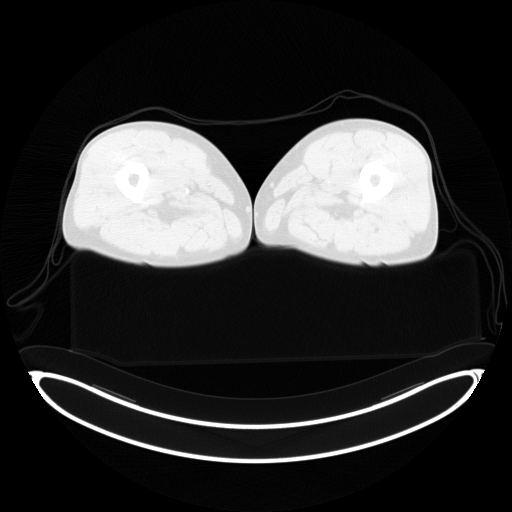
[im 63/249]
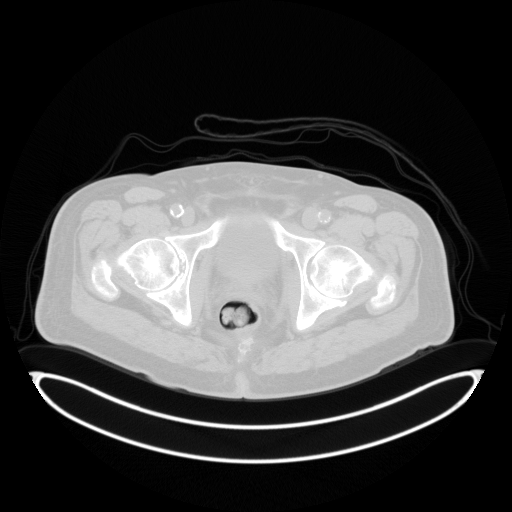
[im 125/249]
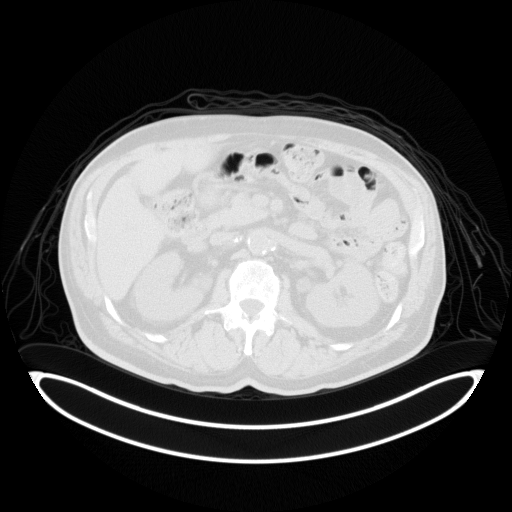
[im 187/249]
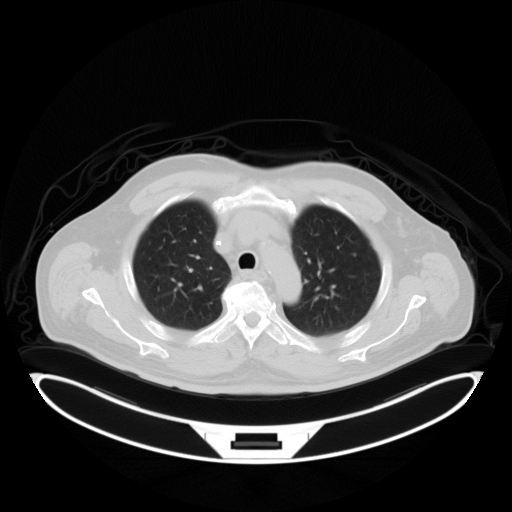
[im 249/249  brain]
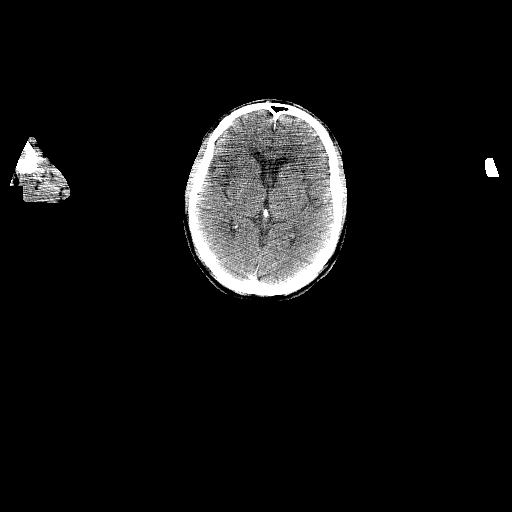

[Series 5: pet sk_thigh nac · axial · 5.0mm · 4.07mm/px · z∈[-1478,-486]mm · 6 of 249 slices shown]
[im 1/249]
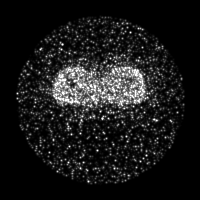
[im 50/249]
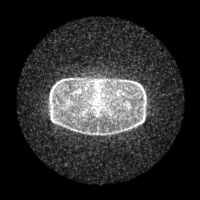
[im 100/249]
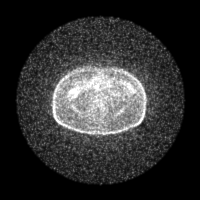
[im 149/249]
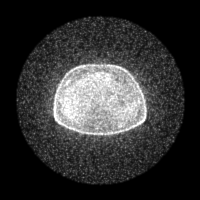
[im 199/249]
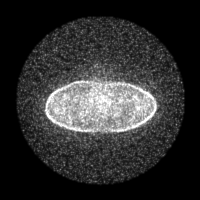
[im 249/249]
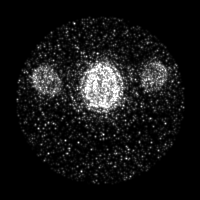

[Series 8: ct sk_thigh 5.0 br59 lung_bone · axial · 5.0mm · 0.71mm/px · z∈[-940,-644]mm · 2 of 75 slices shown]
[im 1/75]
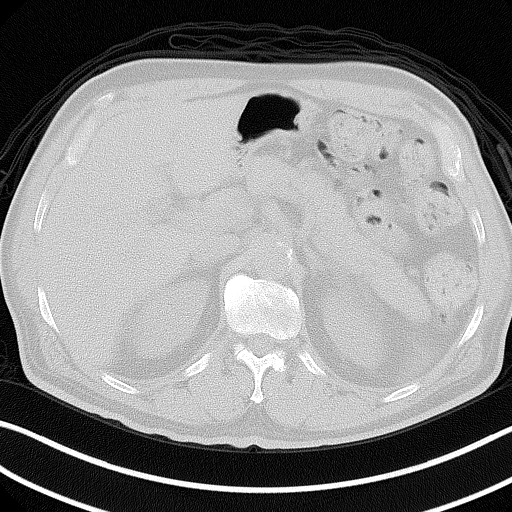
[im 75/75]
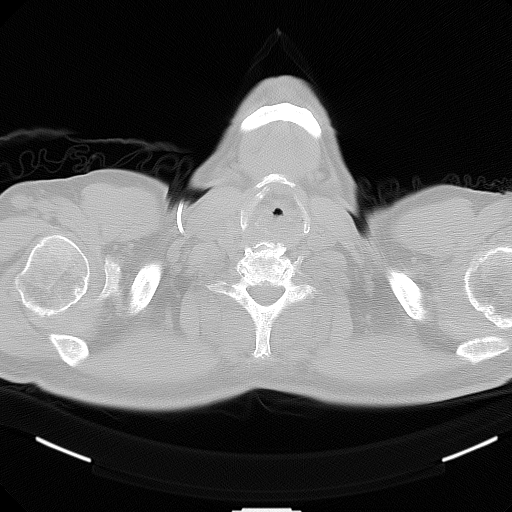

[Series 603: fused cor · 1 of 32 slices shown]
[im 1/32]
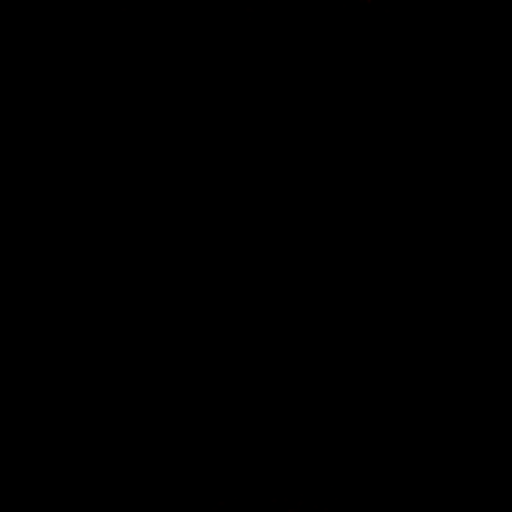

[Series 604: <mip collection> · coronal · 2.06mm/px · 1 of 32 slices shown]
[im 1/32]
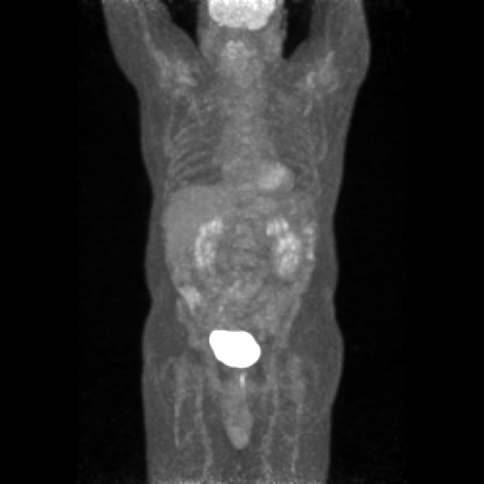

[Series 605: range-ct sk_thigh 5.0 bf37-tra-<alpha range> · 5 of 243 slices shown]
[im 1/243]
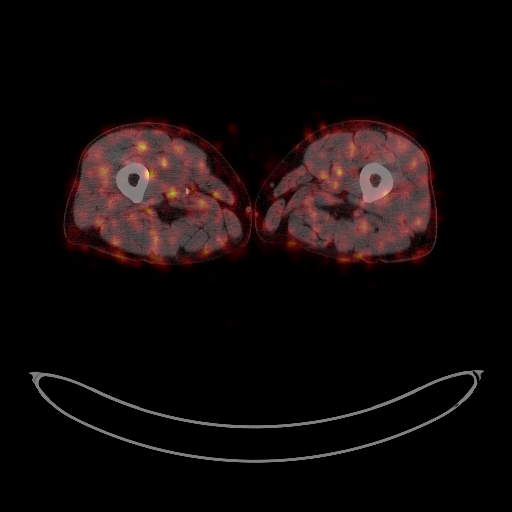
[im 61/243]
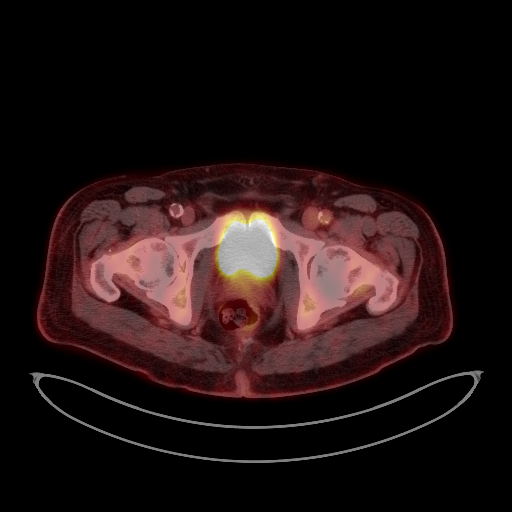
[im 122/243]
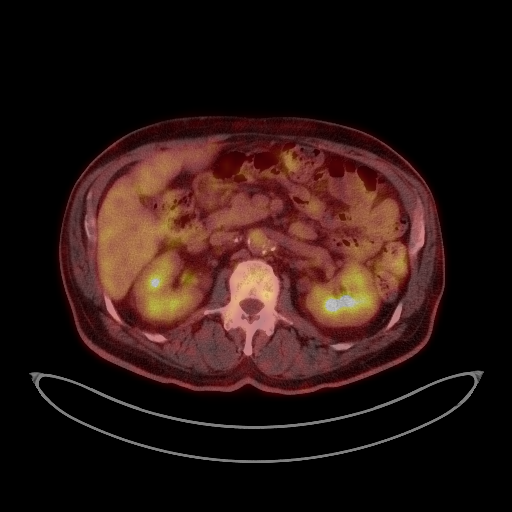
[im 182/243]
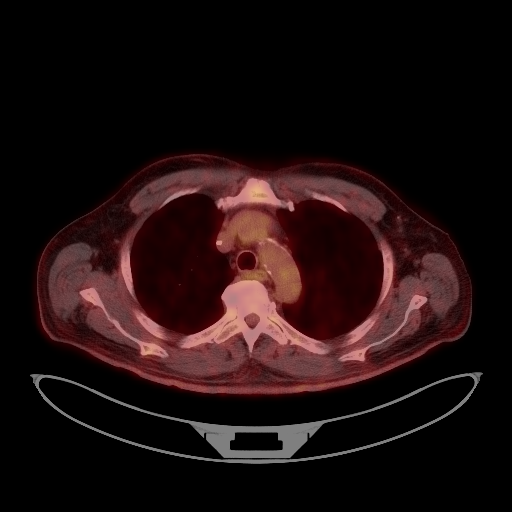
[im 243/243]
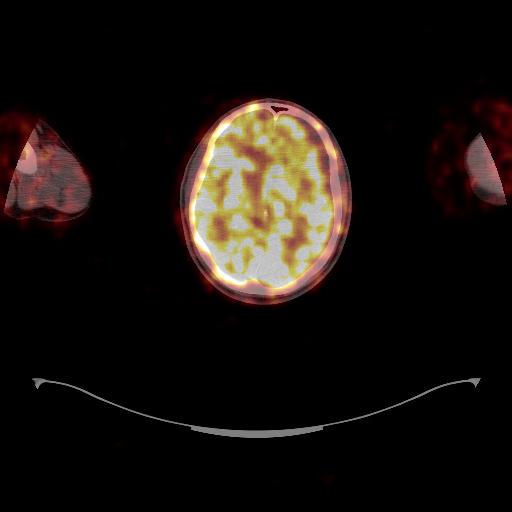

[25 of 25 positions shown; findings below may reference images not displayed]

FINDINGS: Mediastinal blood pool activity: SUV max

Liver activity: SUV max

NECK: Back on 08/08/2020 the left submandibular gland was
substantially enlarged and hypermetabolic. The left submandibular
gland currently measures 2.6 by 2.1 cm on image 36 of series 4 with
maximum SUV of 2.8, [HOSPITAL] 3. In comparison the right
submandibular gland measures 2.8 by 2.1 cm with maximum SUV of
which is also [HOSPITAL] 3. On the more recent exam of 11/05/2020 I
measure the left submandibular gland 2.9 by 2.3 cm and previously
had a maximum SUV of 2.4. No hypermetabolic or pathologically
enlarged adenopathy in the neck. The thyroid is not hypermetabolic.

Incidental CT findings: Bilateral common carotid atherosclerotic
calcification.

Enlarged and heterogeneous thyroid gland with isthmic nodularity
extending inferiorly. Recommend thyroid ultrasound (ref: [HOSPITAL]. [DATE]): 143-50).

CHEST: No hypermetabolic or pathologically enlarged lymph nodes.

Incidental CT findings: Indistinct 4 mm right upper lobe nodule on
image 47 series 8, unchanged. Right Port-A-Cath tip: SVC. Coronary,
aortic arch, and branch vessel atherosclerotic vascular disease.

ABDOMEN/PELVIS: Focal activity in the anus and rectum is likely
physiologic. No recurrent hypermetabolic liver activity or recurrent
hypermetabolic nodal activity. No pathologic adenopathy observed.

Incidental CT findings: Atherosclerosis is present, including
aortoiliac atherosclerotic disease. 1.3 cm hyperdense left renal
lesion with density of 75 Hounsfield units compatible with a complex
cyst. Small nonspecific left adrenal nodule is stable and
technically nonspecific due to small size. Fatty mass with some
speckled internal calcification and faint internal stranding in the
left hip adductor musculature on image 207 series 4 without
hypermetabolic activity. Small hyperdense 0.8 cm right renal lesion,
internal density 53 Hounsfield units on image 127 series 4,
nonspecific.

SKELETON: Extensive scattered mixed density lesions in the skeleton
mostly without substantial focal hypermetabolic activity. Index left
sternal manubrium lesion maximum SUV of 3.0 ([HOSPITAL] 4),
previously 3.5. Current activity at the L4 vertebral level in the
vertebral body has a maximum SUV of 3.7 ([HOSPITAL] 4), formerly 4.4.
Various old rib fractures are noted.

Incidental CT findings: Scattered small free osteochondral fragments
medially in the right hip especially along the acetabular fossa.
Chronic inferior endplate compression at L5.
IMPRESSION: 1. Minimal further improvement compared to the 11/05/2020 exam, with
mild reduction in activity in the extensive scattered mixed density
skeletal lesions (although still [HOSPITAL] 4) and no substantial
hypermetabolic adenopathy currently identified. The left
submandibular gland was formerly involved and is currently nearly
symmetric to the contralateral side.
2. Enlarged and heterogeneous thyroid gland with some isthmic
nodularity. Recommend thyroid ultrasound (ref: [HOSPITAL].
[DATE]): 143-50).
3. Other imaging findings of potential clinical significance: Stable
indistinct 4 mm right upper lobe pulmonary nodule. Aortic
Atherosclerosis ([id number]). Coronary atherosclerosis. Systemic
atherosclerosis. Complex benign cyst of the left kidney. Nonspecific
small left adrenal nodule. Fatty mass in the left hip adductor
musculature, probably a lipoma, no significant hypermetabolic
activity but with some faint internal stranding and calcification
which may merit surveillance. Scattered small free osteochondral
fragments in the right hip acetabular fossa.

## 2022-10-03 DIAGNOSIS — R5383 Other fatigue: Secondary | ICD-10-CM | POA: Diagnosis not present

## 2022-10-03 DIAGNOSIS — R739 Hyperglycemia, unspecified: Secondary | ICD-10-CM | POA: Diagnosis not present

## 2022-10-03 DIAGNOSIS — E559 Vitamin D deficiency, unspecified: Secondary | ICD-10-CM | POA: Diagnosis not present

## 2022-10-09 DIAGNOSIS — E1165 Type 2 diabetes mellitus with hyperglycemia: Secondary | ICD-10-CM | POA: Diagnosis not present

## 2022-10-09 DIAGNOSIS — E559 Vitamin D deficiency, unspecified: Secondary | ICD-10-CM | POA: Diagnosis not present

## 2022-10-09 DIAGNOSIS — Z23 Encounter for immunization: Secondary | ICD-10-CM | POA: Diagnosis not present

## 2022-10-09 DIAGNOSIS — I10 Essential (primary) hypertension: Secondary | ICD-10-CM | POA: Diagnosis not present

## 2022-10-13 DIAGNOSIS — Z1331 Encounter for screening for depression: Secondary | ICD-10-CM | POA: Diagnosis not present

## 2022-10-13 DIAGNOSIS — Z0000 Encounter for general adult medical examination without abnormal findings: Secondary | ICD-10-CM | POA: Diagnosis not present

## 2022-10-13 DIAGNOSIS — Z1339 Encounter for screening examination for other mental health and behavioral disorders: Secondary | ICD-10-CM | POA: Diagnosis not present

## 2022-10-17 ENCOUNTER — Inpatient Hospital Stay: Payer: Medicare PPO | Attending: Hematology and Oncology

## 2022-10-17 ENCOUNTER — Other Ambulatory Visit: Payer: Self-pay | Admitting: Hematology and Oncology

## 2022-10-17 ENCOUNTER — Inpatient Hospital Stay (HOSPITAL_BASED_OUTPATIENT_CLINIC_OR_DEPARTMENT_OTHER): Payer: Medicare PPO | Admitting: Hematology and Oncology

## 2022-10-17 VITALS — BP 129/72 | HR 87 | Temp 98.3°F | Resp 15 | Wt 244.1 lb

## 2022-10-17 DIAGNOSIS — C8442 Peripheral T-cell lymphoma, not classified, intrathoracic lymph nodes: Secondary | ICD-10-CM | POA: Diagnosis not present

## 2022-10-17 DIAGNOSIS — N529 Male erectile dysfunction, unspecified: Secondary | ICD-10-CM | POA: Diagnosis not present

## 2022-10-17 DIAGNOSIS — Z79899 Other long term (current) drug therapy: Secondary | ICD-10-CM | POA: Insufficient documentation

## 2022-10-17 DIAGNOSIS — C8499 Mature T/NK-cell lymphomas, unspecified, extranodal and solid organ sites: Secondary | ICD-10-CM

## 2022-10-17 DIAGNOSIS — Z8572 Personal history of non-Hodgkin lymphomas: Secondary | ICD-10-CM | POA: Insufficient documentation

## 2022-10-17 LAB — CBC WITH DIFFERENTIAL (CANCER CENTER ONLY)
Abs Immature Granulocytes: 0.01 10*3/uL (ref 0.00–0.07)
Basophils Absolute: 0.1 10*3/uL (ref 0.0–0.1)
Basophils Relative: 1 %
Eosinophils Absolute: 0.2 10*3/uL (ref 0.0–0.5)
Eosinophils Relative: 5 %
HCT: 42.4 % (ref 39.0–52.0)
Hemoglobin: 14.5 g/dL (ref 13.0–17.0)
Immature Granulocytes: 0 %
Lymphocytes Relative: 31 %
Lymphs Abs: 1.5 10*3/uL (ref 0.7–4.0)
MCH: 29.8 pg (ref 26.0–34.0)
MCHC: 34.2 g/dL (ref 30.0–36.0)
MCV: 87.1 fL (ref 80.0–100.0)
Monocytes Absolute: 0.5 10*3/uL (ref 0.1–1.0)
Monocytes Relative: 11 %
Neutro Abs: 2.6 10*3/uL (ref 1.7–7.7)
Neutrophils Relative %: 52 %
Platelet Count: 201 10*3/uL (ref 150–400)
RBC: 4.87 MIL/uL (ref 4.22–5.81)
RDW: 14.9 % (ref 11.5–15.5)
WBC Count: 4.9 10*3/uL (ref 4.0–10.5)
nRBC: 0 % (ref 0.0–0.2)

## 2022-10-17 LAB — CMP (CANCER CENTER ONLY)
ALT: 13 U/L (ref 0–44)
AST: 14 U/L — ABNORMAL LOW (ref 15–41)
Albumin: 3.9 g/dL (ref 3.5–5.0)
Alkaline Phosphatase: 83 U/L (ref 38–126)
Anion gap: 6 (ref 5–15)
BUN: 29 mg/dL — ABNORMAL HIGH (ref 8–23)
CO2: 24 mmol/L (ref 22–32)
Calcium: 9 mg/dL (ref 8.9–10.3)
Chloride: 108 mmol/L (ref 98–111)
Creatinine: 1.26 mg/dL — ABNORMAL HIGH (ref 0.61–1.24)
GFR, Estimated: >60 mL/min (ref >60)
Glucose, Bld: 225 mg/dL — ABNORMAL HIGH (ref 70–99)
Potassium: 4.2 mmol/L (ref 3.5–5.1)
Sodium: 138 mmol/L (ref 135–145)
Total Bilirubin: 0.5 mg/dL (ref 0.3–1.2)
Total Protein: 7 g/dL (ref 6.5–8.1)

## 2022-10-17 LAB — LACTATE DEHYDROGENASE: LDH: 135 U/L (ref 98–192)

## 2022-10-17 NOTE — Progress Notes (Signed)
[facility] [phone]   [phone]  PROGRESS NOTE  Patient Care Team: Lewis Moccasin, MD as PCP - General (Family Medicine)  Hematological/Oncological History # CD30-Positive T-cell Lymphoma, Stage III/IV 07/09/2020: patient underwent cholecystectomy. Liver wedge biopsy during the procedure revealed involvement of a CD30-positive T-cell lymphoproliferative disorder 07/25/2020: establish care with Dr. Leonides Schanz  08/08/2020: PET CT scan showed innumerable hypermetabolic lesions (primarily Deauville 4) in the liver and skeleton. Left neck adenopathy including a dominant level IB lymph node which is Deauville 5. 08/15/2020: Cycle 1 Day 1 of BV-CHP  09/04/2020: Cycle 2 Day 1 of BV-CHP 09/27/2020:  Cycle 3 Day 1 of BV-CHP 10/18/2020: Cycle 4 Day 1 of BV-CHP 11/08/2020: Cycle 5 Day 1 of BV-CHP 11/29/2020: Cycle 6 Day 1 of BV-CHP 12/26/2020: PET CT scan showed further improvement compared to the 11/05/2020 exam, with mild reduction in activity in the extensive scattered mixed density skeletal lesions (although still Deauville 4) and no substantial hypermetabolic adenopathy currently identified 02/11/2021: Bone marrow biopsy performed due to concern for FDG avidity of bone marrow.  No evidence of residual disease noted on bone marrow biopsy.  Interval History:  Maurice Fox 72 y.o. male with medical history significant for T cell lymphoma (Stage III/IV) presents for a follow up visit. The patient's last visit was on 07/18/2022.  In the interim since that time he has had no major changes in his health.  On exam today [name] reports he has been well overall in the interim since her last visit 3 months ago.  He is had no major changes in his health.  He reports no infectious symptoms such as runny nose, sore throat, or cough.  He reports that his energy levels are "so-so".  He notes sometimes they are 5 out of 10 but sometimes better.  He reports his appetite is  good and he is eating more.  His weight has been stable.  Has not noticed any bumps or lumps concerning for lymphadenopathy.  He denies any bleeding, bruising, or dark stools.  Overall he feels well with no questions concerns or complaints today.  Patient denies any fevers, chills, shortness of breath, chest pain or constipation. He has no other complaints.  A full 10 point ROS is listed below.  MEDICAL HISTORY:  Past Medical History:  Diagnosis Date   Arthritis    Hyperlipidemia    Hypertension    followed by pcp  (11-23-2019  per pt had stress test greater than 20 yrs ago, told ok)   Nocturia    OSA on CPAP    per pt uses nightly   Phimosis    Type 2 diabetes mellitus (HCC)    followed by pcp   (11-23-2019 per pt does not check blood sugar at home)   Wears glasses     SURGICAL HISTORY: Past Surgical History:  Procedure Laterality Date   APPENDECTOMY  child   CHOLECYSTECTOMY N/A 07/09/2020   Procedure: LAPAROSCOPIC CHOLECYSTECTOMY WITH INTRAOPERATIVE CHOLANGIOGRAM,;  Surgeon: [name], MD;  Location: [name];  Service: General;  Laterality: N/A;   CIRCUMCISION N/A 11/29/2019   Procedure: CIRCUMCISION ADULT;  Surgeon: [name], MD;  Location: [facility];  Service: Urology;  Laterality: N/A;   IR IMAGING GUIDED PORT INSERTION  08/06/2020   IR REMOVAL TUN ACCESS W/ PORT W/O FL MOD SED  04/09/2021   LIVER BIOPSY N/A 07/09/2020   Procedure: LAP LIVER BIOPSY;  Surgeon: [name], MD;  Location: [facility];  Service: General;  Laterality: N/A;   ROTATOR CUFF REPAIR Left chld   SUPRA-UMBILICAL HERNIA N/A 07/09/2020   Procedure: PRIMARY REPAIR SUPRA-UMBILICAL HERNIA;  Surgeon: [name], MD;  Location: [facility];  Service: General;  Laterality: N/A;    SOCIAL HISTORY: Social History   Socioeconomic History   Marital status: Divorced    Spouse name: Not on file   Number of children: Not on file   Years of education: Not on file   Highest education level: Not on  file  Occupational History   Not on file  Tobacco Use   Smoking status: Every Day    Current packs/day: 1.00    Average packs/day: 1 pack/day for 49.0 years (49.0 ttl pk-yrs)    Types: Cigars, Cigarettes   Smokeless tobacco: Never  Vaping Use   Vaping status: Never Used  Substance and Sexual Activity   Alcohol use: Not Currently   Drug use: Never   Sexual activity: Not on file  Other Topics Concern   Not on file  Social History Narrative   Not on file   Social Determinants of Health   Financial Resource Strain: Not on file  Food Insecurity: Not on file  Transportation Needs: Not on file  Physical Activity: Not on file  Stress: Not on file  Social Connections: Unknown (06/03/2021)   [facility] from [facility]   Social Network    Social Network: Not on file  Intimate Partner Violence: Unknown (04/25/2021)   Received from [facility], [facility]   HITS    Physically Hurt: Not on file    Insult or Talk Down To: Not on file    Threaten Physical Harm: Not on file    Scream or Curse: Not on file    FAMILY HISTORY: Family History  Problem Relation Age of Onset   Hypotension Mother    Diabetes Mellitus II Father    Prostate cancer Brother    Prostate cancer Brother     ALLERGIES:  has No Known Allergies.  MEDICATIONS:  Current Outpatient Medications  Medication Sig Dispense Refill   amLODipine (NORVASC) 5 MG tablet 1 tablet     aspirin 81 MG EC tablet 1 tablet     atorvastatin (LIPITOR) 10 MG tablet Take 1 tablet by mouth daily.     azelastine (ASTELIN) 0.1 % nasal spray 1 puff in each nostril     diclofenac Sodium (VOLTAREN) 1 % GEL Apply 2 g topically 4 (four) times daily as needed for pain. (Patient not taking: Reported on 04/11/2022)     fluticasone (FLONASE) 50 MCG/ACT nasal spray Place 2 sprays into both nostrils in the morning and at bedtime.     Multiple Vitamins-Minerals (CENTRUM SILVER PO) Take 1 tablet by mouth at bedtime.     Propylene  Glycol (SYSTANE BALANCE OP) Place 1 drop into both eyes 2 (two) times daily.     RYBELSUS 7 MG TABS      sildenafil (VIAGRA) 100 MG tablet Take 100 mg by mouth daily as needed (ED).     tadalafil (CIALIS) 5 MG tablet Take 1 tablet (5 mg total) by mouth daily as needed for erectile dysfunction. 10 tablet 0   tamsulosin (FLOMAX) 0.4 MG CAPS capsule Take 0.4 mg by mouth at bedtime. (Patient not taking: Reported on 04/11/2022)     valsartan-hydrochlorothiazide (DIOVAN-HCT) 320-25 MG tablet Take 1 tablet by mouth daily.     No current facility-administered medications for this visit.    REVIEW OF  SYSTEMS:   Constitutional: ( - ) fevers, ( - )  chills , ( - ) night sweats Eyes: ( - ) blurriness of vision, ( - ) double vision, ( - ) watery eyes Ears, nose, mouth, throat, and face: ( - ) mucositis, ( - ) sore throat Respiratory: ( - ) cough, ( - ) dyspnea, ( - ) wheezes Cardiovascular: ( - ) palpitation, ( - ) chest discomfort, ( - ) lower extremity swelling Gastrointestinal:  ( - ) nausea, ( - ) heartburn, ( - ) change in bowel habits Skin: ( - ) abnormal skin rashes Lymphatics: ( - ) new lymphadenopathy, ( - ) easy bruising Neurological: ( - ) numbness, ( - ) tingling, ( - ) new weaknesses Behavioral/Psych: ( - ) mood change, ( - ) new changes  All other systems were reviewed with the patient and are negative.  PHYSICAL EXAMINATION: ECOG PERFORMANCE STATUS: 1 - Symptomatic but completely ambulatory  Vitals:   10/17/22 1026  BP: 129/72  Pulse: 87  Resp: 15  Temp: 98.3 F (36.8 C)  SpO2: 100%       Filed Weights   10/17/22 1026  Weight: 244 lb 1.6 oz (110.7 kg)       GENERAL: Well-appearing elderly African-American male, alert, no distress and comfortable SKIN: skin color, texture, turgor are normal, no rashes or significant lesions EYES: conjunctiva are pink and non-injected, sclera clear NECK: supple, non-tender LYMPH: resolved left submandibular lymph node, otherwise no  palpable lymphadenopathy in the cervical, axillary or inguinal LUNGS: clear to auscultation and percussion with normal breathing effort HEART: regular rate & rhythm and no murmurs and no lower extremity edema PSYCH: alert & oriented x 3, fluent speech NEURO: no focal motor/sensory deficits  LABORATORY DATA:  I have reviewed the data as listed    Latest Ref Rng & Units 10/17/2022   10:01 AM 07/18/2022    9:23 AM 04/11/2022    1:36 PM  CBC  WBC 4.0 - 10.5 K/uL 4.9  5.6  5.4   Hemoglobin 13.0 - 17.0 g/dL 16.1  09.6  04.5   Hematocrit 39.0 - 52.0 % 42.4  46.1  45.3   Platelets 150 - 400 K/uL 201  259  194        Latest Ref Rng & Units 10/17/2022   10:01 AM 07/18/2022    9:23 AM 07/11/2022   10:19 AM  CMP  Glucose 70 - 99 mg/dL 409  811    BUN 8 - 23 mg/dL 29  19    Creatinine 9.14 - 1.24 mg/dL 7.82  9.56  2.13   Sodium 135 - 145 mmol/L 138  139    Potassium 3.5 - 5.1 mmol/L 4.2  3.9    Chloride 98 - 111 mmol/L 108  106    CO2 22 - 32 mmol/L 24  27    Calcium 8.9 - 10.3 mg/dL 9.0  9.7    Total Protein 6.5 - 8.1 g/dL 7.0  7.2    Total Bilirubin 0.3 - 1.2 mg/dL 0.5  0.5    Alkaline Phos 38 - 126 U/L 83  85    AST 15 - 41 U/L 14  18    ALT 0 - 44 U/L 13  19     RADIOGRAPHIC STUDIES: No images were reviewed during this visit.   ASSESSMENT & PLAN Maurice Fox 72 y.o. male with medical history significant for T cell lymphoma (Stage III/IV) presents for a follow up visit.  After review of the labs, review of the records, and discussion with the patient the patients findings are most consistent with a peripheral CD30 positive T-cell lymphoma.  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  The patient has been staged with a PET CT scan which showed clear involvement of the skeleton and liver, most consistent with stage IV disease.  Given these findings we will plan for 6 cycles of BV CHP chemotherapy.  The regimen of BV CHP consists of brentuximab  1.8 mg/kg IV on day 1, cyclophosphamide 750 mg per metered squared on day 1, doxorubicin 50 mg per metered squared on day 1, and prednisone 60 mg p.o. day 1 through 5.  This is to be continued every 21 days with a plan for up to 6 cycles.  After the third or fourth cycle we will consider restaging PET CT scan in order to evaluate for response.  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 --TTE performed 07/08/2020, shows EF of 50-55% --negative HIV, Hep B and C serologies. --GCSF support on Day 3 of each cycle.  -LDH, Liver enzymes and Alk Phos are improving. Patient will proceed with treatment today.  --PET CT scan performed on 11/05/2020, findings show Deauville 2 disease with some Deauville 4 noted in the skeleton. Post treatment PET after the last cycle showed diminished FDG avidity, but still Deauville 4.  -- Bone marrow biopsy on 02/11/2021 shows no evidence of residual disease. Plan: -- Per NCCN recommendations we will have the patient return to clinic every 3 to 6 months with imaging on a 72-month basis for the first 2 years --Labs at each clinic visit to include CBC, CMP, LDH.  Labs today show white blood cell count 4.9, hemoglobin 14.5, MCV 87.1, and platelets of 201. --last CT scan on 07/10/2021, no evidence of residual/recurrent disease. Next due Dec 2024. Will transition to yearly scans at that time.  --Return to clinic in 3 months time with CT imaging.  After that point clinic visits will be every 6 months with imaging every 12 months for 3 years.  #Erectile Dysfunction -- Patient notes Viagra is not working for him.  We provided prescription for Cialis.  If this remains ineffective would make referral to urology.  #Supportive Care -- port removed 04/09/2021  Orders Placed This Encounter  Procedures   CT CHEST ABDOMEN  PELVIS W CONTRAST    Standing Status:   Future    Standing Expiration Date:   10/17/2023    Order Specific Question:   If indicated for the ordered procedure, I authorize the administration of contrast media per Radiology protocol    Answer:   Yes    Order Specific Question:   Does the patient have a contrast media/X-ray dye allergy?    Answer:   No    Order Specific Question:   Preferred imaging location?    Answer:   [facility]    Order Specific Question:   If indicated for the ordered procedure, I authorize the administration of oral contrast media per Radiology protocol    Answer:   Yes   All questions were answered. The patient knows to call the clinic with any problems, questions or concerns.  A total of more than 25 minutes were spent on this encounter with face-to-face time and non-face-to-face time, including preparing to  see the patient, ordering medications, counseling the patient and coordination of care as outlined above.   Ulysees Barns, MD Department of Hematology/Oncology [facility] Phone: [phone] Pager: [phone] Email: [email or web address]  10/17/2022 10:58 AM

## 2022-11-19 DIAGNOSIS — G4733 Obstructive sleep apnea (adult) (pediatric): Secondary | ICD-10-CM | POA: Diagnosis not present

## 2022-11-19 DIAGNOSIS — I10 Essential (primary) hypertension: Secondary | ICD-10-CM | POA: Diagnosis not present

## 2022-11-19 DIAGNOSIS — E1121 Type 2 diabetes mellitus with diabetic nephropathy: Secondary | ICD-10-CM | POA: Diagnosis not present

## 2022-11-19 DIAGNOSIS — Z720 Tobacco use: Secondary | ICD-10-CM | POA: Diagnosis not present

## 2022-11-19 DIAGNOSIS — E1165 Type 2 diabetes mellitus with hyperglycemia: Secondary | ICD-10-CM | POA: Diagnosis not present

## 2022-11-19 DIAGNOSIS — C8590 Non-Hodgkin lymphoma, unspecified, unspecified site: Secondary | ICD-10-CM | POA: Diagnosis not present

## 2022-11-19 DIAGNOSIS — C229 Malignant neoplasm of liver, not specified as primary or secondary: Secondary | ICD-10-CM | POA: Diagnosis not present

## 2022-12-22 DIAGNOSIS — Z6833 Body mass index (BMI) 33.0-33.9, adult: Secondary | ICD-10-CM | POA: Diagnosis not present

## 2022-12-22 DIAGNOSIS — E1165 Type 2 diabetes mellitus with hyperglycemia: Secondary | ICD-10-CM | POA: Diagnosis not present

## 2022-12-22 DIAGNOSIS — I10 Essential (primary) hypertension: Secondary | ICD-10-CM | POA: Diagnosis not present

## 2022-12-24 DIAGNOSIS — R5383 Other fatigue: Secondary | ICD-10-CM | POA: Diagnosis not present

## 2022-12-24 DIAGNOSIS — E785 Hyperlipidemia, unspecified: Secondary | ICD-10-CM | POA: Diagnosis not present

## 2022-12-24 DIAGNOSIS — R739 Hyperglycemia, unspecified: Secondary | ICD-10-CM | POA: Diagnosis not present

## 2023-01-01 DIAGNOSIS — I129 Hypertensive chronic kidney disease with stage 1 through stage 4 chronic kidney disease, or unspecified chronic kidney disease: Secondary | ICD-10-CM | POA: Diagnosis not present

## 2023-01-01 DIAGNOSIS — Z789 Other specified health status: Secondary | ICD-10-CM | POA: Diagnosis not present

## 2023-01-01 DIAGNOSIS — E1122 Type 2 diabetes mellitus with diabetic chronic kidney disease: Secondary | ICD-10-CM | POA: Diagnosis not present

## 2023-01-01 DIAGNOSIS — E1121 Type 2 diabetes mellitus with diabetic nephropathy: Secondary | ICD-10-CM | POA: Diagnosis not present

## 2023-01-01 DIAGNOSIS — I10 Essential (primary) hypertension: Secondary | ICD-10-CM | POA: Diagnosis not present

## 2023-01-01 DIAGNOSIS — E782 Mixed hyperlipidemia: Secondary | ICD-10-CM | POA: Diagnosis not present

## 2023-01-01 DIAGNOSIS — J309 Allergic rhinitis, unspecified: Secondary | ICD-10-CM | POA: Diagnosis not present

## 2023-01-01 DIAGNOSIS — E1165 Type 2 diabetes mellitus with hyperglycemia: Secondary | ICD-10-CM | POA: Diagnosis not present

## 2023-01-07 ENCOUNTER — Encounter (HOSPITAL_COMMUNITY): Payer: Self-pay

## 2023-01-07 ENCOUNTER — Ambulatory Visit (HOSPITAL_COMMUNITY)
Admission: RE | Admit: 2023-01-07 | Discharge: 2023-01-07 | Disposition: A | Discharge status: Home / Self Care | Payer: Medicare PPO | Source: Ambulatory Visit | Attending: Hematology and Oncology | Admitting: Hematology and Oncology

## 2023-01-07 DIAGNOSIS — I700 Atherosclerosis of aorta: Secondary | ICD-10-CM | POA: Insufficient documentation

## 2023-01-07 DIAGNOSIS — N2889 Other specified disorders of kidney and ureter: Secondary | ICD-10-CM | POA: Insufficient documentation

## 2023-01-07 DIAGNOSIS — E1165 Type 2 diabetes mellitus with hyperglycemia: Secondary | ICD-10-CM | POA: Insufficient documentation

## 2023-01-07 DIAGNOSIS — C9590 Leukemia, unspecified not having achieved remission: Secondary | ICD-10-CM | POA: Diagnosis not present

## 2023-01-07 DIAGNOSIS — C8442 Peripheral T-cell lymphoma, not classified, intrathoracic lymph nodes: Secondary | ICD-10-CM | POA: Diagnosis not present

## 2023-01-07 DIAGNOSIS — R918 Other nonspecific abnormal finding of lung field: Secondary | ICD-10-CM | POA: Insufficient documentation

## 2023-01-07 DIAGNOSIS — M898X0 Other specified disorders of bone, multiple sites: Secondary | ICD-10-CM | POA: Insufficient documentation

## 2023-01-07 LAB — POCT I-STAT CREATININE: Creatinine, Ser: 1.2 mg/dL (ref 0.61–1.24)

## 2023-01-07 MED ORDER — IOHEXOL 300 MG/ML  SOLN
30.0000 mL | Freq: Once | INTRAMUSCULAR | Status: AC | PRN
  Administered 2023-01-07: 30 mL via ORAL

## 2023-01-07 MED ORDER — IOHEXOL 300 MG/ML  SOLN
100.0000 mL | Freq: Once | INTRAMUSCULAR | Status: AC | PRN
  Administered 2023-01-07: 100 mL via INTRAVENOUS

## 2023-01-07 MED ORDER — SODIUM CHLORIDE (PF) 0.9 % IJ SOLN
INTRAMUSCULAR | Status: AC
  Filled 2023-01-07: qty 50

## 2023-01-10 ENCOUNTER — Telehealth: Payer: Self-pay | Admitting: *Deleted

## 2023-01-10 NOTE — Telephone Encounter (Signed)
TCT patient regarding recent CT scan.  Spoke with patient. Advised that his CT scan looks excellent. There is no evidence of residual or recurrent lymphoma. Will plan to see him back as scheduled in January 2025. Pt is aware of date and time for his next appt.

## 2023-01-10 NOTE — Telephone Encounter (Signed)
-----   Message from Maurice Fox sent at 01/10/2023  8:43 AM EST ----- Please let Mr. Pedley know that his CT scan looks excellent.  There is no evidence of residual or recurrent lymphoma.  Will plan to see him back as scheduled in January 2025. ----- Message ----- From: Leory Plowman, Rad Results In Sent: 01/07/2023   5:16 PM EST To: Jaci Standard, MD

## 2023-01-22 ENCOUNTER — Other Ambulatory Visit: Payer: Self-pay | Admitting: Hematology and Oncology

## 2023-01-22 DIAGNOSIS — C8442 Peripheral T-cell lymphoma, not classified, intrathoracic lymph nodes: Secondary | ICD-10-CM

## 2023-01-22 DIAGNOSIS — C8499 Mature T/NK-cell lymphomas, unspecified, extranodal and solid organ sites: Secondary | ICD-10-CM

## 2023-01-22 DIAGNOSIS — Z95828 Presence of other vascular implants and grafts: Secondary | ICD-10-CM

## 2023-01-22 NOTE — Progress Notes (Signed)
 [facility] [phone]   [phone]  PROGRESS NOTE  Patient Care Team: Maurice Almarie SAUNDERS, MD as PCP - General (Family Medicine)  Hematological/Oncological History # CD30-Positive T-cell Lymphoma, Stage III/IV 07/09/2020: patient underwent cholecystectomy. Liver wedge biopsy during the procedure revealed involvement of a CD30-positive T-cell lymphoproliferative disorder 07/25/2020: establish care with Maurice Fox  08/08/2020: PET CT scan showed innumerable hypermetabolic lesions (primarily Deauville 4) in the liver and skeleton. Left neck adenopathy including a dominant level IB lymph node which is Deauville 5. 08/15/2020: Cycle 1 Day 1 of BV-CHP  09/04/2020: Cycle 2 Day 1 of BV-CHP 09/27/2020:  Cycle 3 Day 1 of BV-CHP 10/18/2020: Cycle 4 Day 1 of BV-CHP 11/08/2020: Cycle 5 Day 1 of BV-CHP 11/29/2020: Cycle 6 Day 1 of BV-CHP 12/26/2020: PET CT scan showed further improvement compared to the 11/05/2020 exam, with mild reduction in activity in the extensive scattered mixed density skeletal lesions (although still Deauville 4) and no substantial hypermetabolic adenopathy currently identified 02/11/2021: Bone marrow biopsy performed due to concern for FDG avidity of bone marrow.  No evidence of residual disease noted on bone marrow biopsy.  Interval History:  Maurice Fox 73 y.o. male with medical history significant for T cell lymphoma (Stage III/IV) presents for a follow up visit. The patient's last visit was on 10/17/2022.  In the interim since that time he has had no major changes in his health.  On exam today Maurice Fox reports he has been well overall and interim since her last visit.  He reports he is feeling quite good and his energy levels are fortunately just so-so.  He reports that he sleeps well at night and has had no major changes in his weight.  He reports that he is also had no infectious symptoms such as runny nose, sore throat, or cough.  He  denies any fevers, chills, sweats.  He reports that he is recently however getting over a cold but is not having any residual symptoms.  He reports that his stools have been really dark as of late, he notes that he is not eating any foods known to cause dark stools such as beets, iron  pills, or Pepto-Bismol.  He reports that they can also be sticky and tarry.  He notes that he is post to be getting a colonoscopy in the future at the [facility] but does not currently have a day or 1 scheduled.  Overall he feels well with no questions concerns or complaints today.  Patient denies any fevers, chills, shortness of breath, chest pain or constipation. He has no other complaints.  A full 10 point ROS is listed below.  MEDICAL HISTORY:  Past Medical History:  Diagnosis Date   Arthritis    Hyperlipidemia    Hypertension    followed by pcp  (11-23-2019  per pt had stress test greater than 20 yrs ago, told ok)   Nocturia    OSA on CPAP    per pt uses nightly   Phimosis    Type 2 diabetes mellitus (HCC)    followed by pcp   (11-23-2019 per pt does not check blood sugar at home)   Wears glasses     SURGICAL HISTORY: Past Surgical History:  Procedure Laterality Date   APPENDECTOMY  child   CHOLECYSTECTOMY N/A 07/09/2020   Procedure: LAPAROSCOPIC CHOLECYSTECTOMY WITH INTRAOPERATIVE CHOLANGIOGRAM,;  Surgeon: Maurice Locus, MD;  Location: [facility];  Service: General;  Laterality: N/A;   CIRCUMCISION N/A 11/29/2019   Procedure:  CIRCUMCISION ADULT;  Surgeon: Maurice Zachary NOVAK, MD;  Location: [facility];  Service: Urology;  Laterality: N/A;   IR IMAGING GUIDED PORT INSERTION  08/06/2020   IR REMOVAL TUN ACCESS W/ PORT W/O FL MOD SED  04/09/2021   LIVER BIOPSY N/A 07/09/2020   Procedure: LAP LIVER BIOPSY;  Surgeon: Maurice Locus, MD;  Location: [facility];  Service: General;  Laterality: N/A;   ROTATOR CUFF REPAIR Left chld   SUPRA-UMBILICAL HERNIA N/A 07/09/2020   Procedure: PRIMARY REPAIR SUPRA-UMBILICAL  HERNIA;  Surgeon: Maurice Locus, MD;  Location: [facility];  Service: General;  Laterality: N/A;    SOCIAL HISTORY: Social History   Socioeconomic History   Marital status: Divorced    Spouse name: Not on file   Number of children: Not on file   Years of education: Not on file   Highest education level: Not on file  Occupational History   Not on file  Tobacco Use   Smoking status: Every Day    Current packs/day: 1.00    Average packs/day: 1 pack/day for 49.0 years (49.0 ttl pk-yrs)    Types: Cigars, Cigarettes   Smokeless tobacco: Never  Vaping Use   Vaping status: Never Used  Substance and Sexual Activity   Alcohol  use: Not Currently   Drug use: Never   Sexual activity: Not on file  Other Topics Concern   Not on file  Social History Narrative   Not on file   Social Drivers of Health   Financial Resource Strain: Not on file  Food Insecurity: Not on file  Transportation Needs: Not on file  Physical Activity: Not on file  Stress: Not on file  Social Connections: Unknown (06/03/2021)   Received from [facility]   Social Network    Social Network: Not on file  Intimate Partner Violence: Unknown (04/25/2021)   Received from [facility], [facility]   HITS    Physically Hurt: Not on file    Insult or Talk Down To: Not on file    Threaten Physical Harm: Not on file    Scream or Curse: Not on file    FAMILY HISTORY: Family History  Problem Relation Age of Onset   Hypotension Mother    Diabetes Mellitus II Father    Prostate cancer Brother    Prostate cancer Brother     ALLERGIES:  has no known allergies.  MEDICATIONS:  Current Outpatient Medications  Medication Sig Dispense Refill   olopatadine (PATANOL) 0.1 % ophthalmic solution Apply to eye.     rosuvastatin  (CRESTOR ) 10 MG tablet Take 10 mg by mouth at bedtime.     amLODipine  (NORVASC ) 5 MG tablet 1 tablet     aspirin 81 MG EC tablet 1 tablet     azelastine  (ASTELIN ) 0.1 % nasal spray 1  puff in each nostril (Patient not taking: Reported on 01/23/2023)     diclofenac  Sodium (VOLTAREN ) 1 % GEL Apply 2 g topically 4 (four) times daily as needed for pain. (Patient not taking: Reported on 04/11/2022)     fluticasone  (FLONASE ) 50 MCG/ACT nasal spray Place 2 sprays into both nostrils in the morning and at bedtime. (Patient not taking: Reported on 01/23/2023)     Multiple Vitamins-Minerals (CENTRUM SILVER PO) Take 1 tablet by mouth at bedtime.     RYBELSUS 7 MG TABS      sildenafil (VIAGRA) 100 MG tablet Take 100 mg by mouth daily as needed (ED).     tadalafil  (CIALIS )  5 MG tablet Take 1 tablet (5 mg total) by mouth daily as needed for erectile dysfunction. 10 tablet 0   No current facility-administered medications for this visit.    REVIEW OF SYSTEMS:   Constitutional: ( - ) fevers, ( - )  chills , ( - ) night sweats Eyes: ( - ) blurriness of vision, ( - ) double vision, ( - ) watery eyes Ears, nose, mouth, throat, and face: ( - ) mucositis, ( - ) sore throat Respiratory: ( - ) cough, ( - ) dyspnea, ( - ) wheezes Cardiovascular: ( - ) palpitation, ( - ) chest discomfort, ( - ) lower extremity swelling Gastrointestinal:  ( - ) nausea, ( - ) heartburn, ( - ) change in bowel habits Skin: ( - ) abnormal skin rashes Lymphatics: ( - ) new lymphadenopathy, ( - ) easy bruising Neurological: ( - ) numbness, ( - ) tingling, ( - ) new weaknesses Behavioral/Psych: ( - ) mood change, ( - ) new changes  All other systems were reviewed with the patient and are negative.  PHYSICAL EXAMINATION: ECOG PERFORMANCE STATUS: 1 - Symptomatic but completely ambulatory  Vitals:   01/23/23 1125  BP: 133/70  Pulse: (!) 101  Resp: 17  Temp: 97.9 F (36.6 C)  SpO2: 99%   Filed Weights   01/23/23 1125  Weight: 246 lb 9.6 oz (111.9 kg)    GENERAL: Well-appearing elderly African-American male, alert, no distress and comfortable SKIN: skin color, texture, turgor are normal, no rashes or significant  lesions EYES: conjunctiva are pink and non-injected, sclera clear NECK: supple, non-tender LYMPH: resolved left submandibular lymph node, otherwise no palpable lymphadenopathy in the cervical, axillary or inguinal LUNGS: clear to auscultation and percussion with normal breathing effort HEART: regular rate & rhythm and no murmurs and no lower extremity edema PSYCH: alert & oriented x 3, fluent speech NEURO: no focal motor/sensory deficits  LABORATORY DATA:  I have reviewed the data as listed    Latest Ref Rng & Units 01/23/2023   10:33 AM 10/17/2022   10:01 AM 07/18/2022    9:23 AM  CBC  WBC 4.0 - 10.5 K/uL 5.8  4.9  5.6   Hemoglobin 13.0 - 17.0 g/dL 86.8  85.4  84.4   Hematocrit 39.0 - 52.0 % 39.0  42.4  46.1   Platelets 150 - 400 K/uL 226  201  259        Latest Ref Rng & Units 01/23/2023   10:33 AM 01/07/2023   12:02 PM 10/17/2022   10:01 AM  CMP  Glucose 70 - 99 mg/dL 631   774   BUN 8 - 23 mg/dL 18   29   Creatinine 9.38 - 1.24 mg/dL 8.78  8.79  8.73   Sodium 135 - 145 mmol/L 133   138   Potassium 3.5 - 5.1 mmol/L 3.9   4.2   Chloride 98 - 111 mmol/L 102   108   CO2 22 - 32 mmol/L 25   24   Calcium  8.9 - 10.3 mg/dL 9.0   9.0   Total Protein 6.5 - 8.1 g/dL 7.0   7.0   Total Bilirubin 0.0 - 1.2 mg/dL 0.5   0.5   Alkaline Phos 38 - 126 U/L 94   83   AST 15 - 41 U/L 18   14   ALT 0 - 44 U/L 16   13    RADIOGRAPHIC STUDIES: No images were reviewed during this visit.  ASSESSMENT & PLAN KYZER BLOWE 73 y.o. male with medical history significant for T cell lymphoma (Stage III/IV) presents for a follow up visit.  After review of the labs, review of the records, and discussion with the patient the patients findings are most consistent with a peripheral CD30 positive T-cell lymphoma.  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  The patient has been staged with a PET CT scan which showed clear involvement of the skeleton and liver, most  consistent with stage IV disease.  Given these findings we will plan for 6 cycles of BV CHP chemotherapy.  The regimen of BV CHP consists of brentuximab 1.8 mg/kg IV on day 1, cyclophosphamide  750 mg per metered squared on day 1, doxorubicin  50 mg per metered squared on day 1, and prednisone  60 mg p.o. day 1 through 5.  This is to be continued every 21 days with a plan for up to 6 cycles.  After the third or fourth cycle we will consider restaging PET CT scan in order to evaluate for response.  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 --TTE performed 07/08/2020, shows EF of 50-55% --negative HIV, Hep B and C serologies. --GCSF support on Day 3 of each cycle.  -LDH, Liver enzymes and Alk Phos are improving. Patient will proceed with treatment today.  --PET CT scan performed on 11/05/2020, findings show Deauville 2 disease with some Deauville 4 noted in the skeleton. Post treatment PET after the last cycle showed diminished FDG avidity, but still Deauville 4.  -- Bone marrow biopsy on 02/11/2021 shows no evidence of residual disease. Plan: -- Per NCCN recommendations we will have the patient return to clinic every 3 to 6 months with imaging on a 29-month basis for the first 2 years --Labs at each clinic visit to include CBC, CMP, LDH.  Labs today show white blood cell count 5.8, Hgb 13.1, MCV 81.9, Plt 226 --last CT scan in Dec 2024 showed no evidence of residual/recurrent disease. Next due Dec 2025.  --Return to clinic in  6 months with imaging every 12 months for 3 years.  #Dark Tarry Stools # Hemoglobin Drop  -- Patient notes that he is having dark tarry stools that are sticky.  He also notes a drop in his energy levels and hemoglobin has dropped approximately 3 points, however is still within normal limits. -- Reaching out to  gastroenterology to see if endoscopic intervention would be appropriate. -- [facility], but nothing is currently scheduled.  Would recommend ordering this through the [facility] health system in order to assure it to be done quickly.  #Erectile Dysfunction -- Patient notes Viagra is not working for him.  We provided prescription for Cialis .  If this remains ineffective would make referral to urology.  #Supportive Care -- port removed 04/09/2021  No orders of the defined types were placed in this encounter.  All questions were answered. The patient knows to call the clinic with any problems, questions or concerns.  A total of more than 30 minutes were spent on this encounter with face-to-face time and non-face-to-face time, including preparing to see the patient, ordering medications, counseling the patient and coordination of care as outlined above.   Norleen IVAR Kidney, MD Department of Hematology/Oncology [facility]  at [facility] Phone: [phone] Pager: [phone] Email: [email or web address]  01/25/2023 11:48 AM

## 2023-01-23 ENCOUNTER — Inpatient Hospital Stay (HOSPITAL_BASED_OUTPATIENT_CLINIC_OR_DEPARTMENT_OTHER): Payer: Medicare (Managed Care) | Admitting: Hematology and Oncology

## 2023-01-23 ENCOUNTER — Inpatient Hospital Stay: Payer: Medicare (Managed Care) | Attending: Hematology and Oncology

## 2023-01-23 VITALS — BP 133/70 | HR 101 | Temp 97.9°F | Resp 17 | Wt 246.6 lb

## 2023-01-23 DIAGNOSIS — C8499 Mature T/NK-cell lymphomas, unspecified, extranodal and solid organ sites: Secondary | ICD-10-CM

## 2023-01-23 DIAGNOSIS — K921 Melena: Secondary | ICD-10-CM | POA: Diagnosis not present

## 2023-01-23 DIAGNOSIS — Z95828 Presence of other vascular implants and grafts: Secondary | ICD-10-CM

## 2023-01-23 DIAGNOSIS — N529 Male erectile dysfunction, unspecified: Secondary | ICD-10-CM | POA: Insufficient documentation

## 2023-01-23 DIAGNOSIS — Z8572 Personal history of non-Hodgkin lymphomas: Secondary | ICD-10-CM | POA: Diagnosis present

## 2023-01-23 LAB — CBC WITH DIFFERENTIAL (CANCER CENTER ONLY)
Abs Immature Granulocytes: 0.01 10*3/uL (ref 0.00–0.07)
Basophils Absolute: 0.1 10*3/uL (ref 0.0–0.1)
Basophils Relative: 1 %
Eosinophils Absolute: 0.2 10*3/uL (ref 0.0–0.5)
Eosinophils Relative: 3 %
HCT: 39 % (ref 39.0–52.0)
Hemoglobin: 13.1 g/dL (ref 13.0–17.0)
Immature Granulocytes: 0 %
Lymphocytes Relative: 30 %
Lymphs Abs: 1.7 10*3/uL (ref 0.7–4.0)
MCH: 27.5 pg (ref 26.0–34.0)
MCHC: 33.6 g/dL (ref 30.0–36.0)
MCV: 81.9 fL (ref 80.0–100.0)
Monocytes Absolute: 0.6 10*3/uL (ref 0.1–1.0)
Monocytes Relative: 9 %
Neutro Abs: 3.3 10*3/uL (ref 1.7–7.7)
Neutrophils Relative %: 57 %
Platelet Count: 226 10*3/uL (ref 150–400)
RBC: 4.76 MIL/uL (ref 4.22–5.81)
RDW: 15.1 % (ref 11.5–15.5)
WBC Count: 5.8 10*3/uL (ref 4.0–10.5)
nRBC: 0 % (ref 0.0–0.2)

## 2023-01-23 LAB — CMP (CANCER CENTER ONLY)
ALT: 16 U/L (ref 0–44)
AST: 18 U/L (ref 15–41)
Albumin: 3.8 g/dL (ref 3.5–5.0)
Alkaline Phosphatase: 94 U/L (ref 38–126)
Anion gap: 6 (ref 5–15)
BUN: 18 mg/dL (ref 8–23)
CO2: 25 mmol/L (ref 22–32)
Calcium: 9 mg/dL (ref 8.9–10.3)
Chloride: 102 mmol/L (ref 98–111)
Creatinine: 1.21 mg/dL (ref 0.61–1.24)
GFR, Estimated: >60 mL/min (ref >60)
Glucose, Bld: 368 mg/dL — ABNORMAL HIGH (ref 70–99)
Potassium: 3.9 mmol/L (ref 3.5–5.1)
Sodium: 133 mmol/L — ABNORMAL LOW (ref 135–145)
Total Bilirubin: 0.5 mg/dL (ref 0.0–1.2)
Total Protein: 7 g/dL (ref 6.5–8.1)

## 2023-01-23 LAB — LACTATE DEHYDROGENASE: LDH: 147 U/L (ref 98–192)

## 2023-01-25 ENCOUNTER — Encounter: Payer: Self-pay | Admitting: Hematology and Oncology

## 2023-01-29 ENCOUNTER — Telehealth: Payer: Self-pay

## 2023-01-29 NOTE — Telephone Encounter (Signed)
 Called patient to schedule an appointment to be seen in office no answer left message on voice mail to call office back.

## 2023-01-30 ENCOUNTER — Encounter: Payer: Self-pay | Admitting: Hematology and Oncology

## 2023-01-30 DIAGNOSIS — Z13220 Encounter for screening for lipoid disorders: Secondary | ICD-10-CM | POA: Diagnosis not present

## 2023-01-30 DIAGNOSIS — Z0000 Encounter for general adult medical examination without abnormal findings: Secondary | ICD-10-CM | POA: Diagnosis not present

## 2023-01-30 DIAGNOSIS — Z125 Encounter for screening for malignant neoplasm of prostate: Secondary | ICD-10-CM | POA: Diagnosis not present

## 2023-01-30 DIAGNOSIS — E782 Mixed hyperlipidemia: Secondary | ICD-10-CM | POA: Diagnosis not present

## 2023-01-30 DIAGNOSIS — Z114 Encounter for screening for human immunodeficiency virus [HIV]: Secondary | ICD-10-CM | POA: Diagnosis not present

## 2023-01-30 DIAGNOSIS — R5383 Other fatigue: Secondary | ICD-10-CM | POA: Diagnosis not present

## 2023-01-30 NOTE — Telephone Encounter (Signed)
 Patient returned your call, please advise.

## 2023-02-06 NOTE — Progress Notes (Unsigned)
02/09/2023 Maurice LYNAM Sr. [id number] [birth year]  Referring provider: Lewis Moccasin, MD Primary GI doctor: Dr. Leonides Schanz  ASSESSMENT AND PLAN:   Melena with associated 2 to 3 g drop of HGB no further episodes, no iron, no pepto Hgb baseline 15-16, 2 weeks ago Hgb 13.1 No gerd, no nausea, no weight loss Has lymphoma with mets to liver, no thrombocytopenia or portal HTN Uncertain etiology of melena, no GERD, nausea, weight loss, will plan on EGD with colonoscopy Recheck CBC, iron, ferritin, B12  I discussed risks of EGD with patient today, including risk of sedation, bleeding or perforation.  Patient provides understanding and gave verbal consent to proceed.  Family history of colon cancer in his sister Personal history of polyps Will repeat colonoscopy as patient states he is over due. We have discussed the risks of bleeding, infection, perforation, medication reactions, and remote risk of death associated with colonoscopy. All questions were answered and the patient acknowledges these risk and wishes to proceed.  Lymphoma Dr. Leonides Schanz in hematology for positive T-cell lymphoma stage III/IV diagnosed 2022 with involvement of liver, skeleton and left neck adenopathy.  Status post chemotherapy, most recent bone marrow biopsy with no residual disease.  Patient Care Team: Lewis Moccasin, MD as PCP - General (Family Medicine)  HISTORY OF PRESENT ILLNESS: 73 y.o. male with a past medical history of hyperlipidemia, hypertension, OSA with CPAP, type 2 diabetes, positive T-cell lymphoma with metastasis to liver, skeleton, status post cholecystectomy and others listed below presents as a new patient for evaluation of IDA.   Patient follows with Dr. Leonides Schanz in hematology for positive T-cell lymphoma stage III/IV diagnosed 2022 with involvement of liver, skeleton and left neck adenopathy. 2022 status post cholecystectomy left liver wedge biopsy that did show involvement of T-cell  lymphoproliferative disorder Patient completed chemotherapy with Dr. Leonides Schanz 11/29/2020, 02/11/2021 bone marrow biopsy showed no residual disease. 01/23/2023 completion complaining of black stools. Gets colonoscopies with [facility] and states he is due.  Patient's baseline hemoglobin 15-16, as of 07/18/2022.  10/17/2022 Hgb 14.5, 01/23/2023 Hgb 13.1.  Had 2 to 3 g drop of hemoglobin over the last 6 months.  Discussed the use of AI scribe software for clinical note transcription with the patient, who gave verbal consent to proceed.  History of Present Illness   The patient, with a history of polyps, presents with a three-day episode of black stool three weeks ago. The stool was 'extremely dark' and 'shiny,' and the frequency was 'multiple times a day,' which was unusual for the patient. He denies any associated symptoms such as nausea, discomfort, weakness, shortness of breath, or dizziness. He also denies any changes in diet, medication, or supplement use around the time of the episode.  The patient is due for a colonoscopy this year, as he has a history of polyps and a family history of colon cancer in an older sister. He has not noticed any changes in bowel habits, blood in the stool, abdominal pain, heartburn, reflux, nausea, or vomiting. He denies any history of alcohol or drug use but does smoke. He takes a daily low-dose aspirin (81mg ) and denies any use of Aleve, ibuprofen, or goody powders.  The patient also reports a history of sleep apnea, for which he uses a CPAP machine. He denies any chest discomfort or shortness of breath.      He  reports that he has been smoking cigars and cigarettes. He has a 49 pack-year smoking history. He has never  used smokeless tobacco. He reports that he does not currently use alcohol. He reports that he does not use drugs.  RELEVANT GI HISTORY, LABS, IMAGING:  CBC    Component Value Date/Time   WBC 5.8 01/23/2023 1033   WBC 4.4 02/11/2021 0830   RBC 4.76  01/23/2023 1033   HGB 13.1 01/23/2023 1033   HCT 39.0 01/23/2023 1033   PLT 226 01/23/2023 1033   MCV 81.9 01/23/2023 1033   MCH 27.5 01/23/2023 1033   MCHC 33.6 01/23/2023 1033   RDW 15.1 01/23/2023 1033   LYMPHSABS 1.7 01/23/2023 1033   MONOABS 0.6 01/23/2023 1033   EOSABS 0.2 01/23/2023 1033   BASOSABS 0.1 01/23/2023 1033   Recent Labs    04/11/22 1336 07/18/22 0923 10/17/22 1001 01/23/23 1033  HGB 15.4 15.5 14.5 13.1    CMP     Component Value Date/Time   NA 133 (L) 01/23/2023 1033   K 3.9 01/23/2023 1033   CL 102 01/23/2023 1033   CO2 25 01/23/2023 1033   GLUCOSE 368 (H) 01/23/2023 1033   BUN 18 01/23/2023 1033   CREATININE 1.21 01/23/2023 1033   CALCIUM 9.0 01/23/2023 1033   PROT 7.0 01/23/2023 1033   ALBUMIN 3.8 01/23/2023 1033   AST 18 01/23/2023 1033   ALT 16 01/23/2023 1033   ALKPHOS 94 01/23/2023 1033   BILITOT 0.5 01/23/2023 1033   GFRNONAA >60 01/23/2023 1033   GFRAA >90 07/26/2011 2355      Latest Ref Rng & Units 01/23/2023   10:33 AM 10/17/2022   10:01 AM 07/18/2022    9:23 AM  Hepatic Function  Total Protein 6.5 - 8.1 g/dL 7.0  7.0  7.2   Albumin 3.5 - 5.0 g/dL 3.8  3.9  3.8   AST 15 - 41 U/L 18  14  18    ALT 0 - 44 U/L 16  13  19    Alk Phosphatase 38 - 126 U/L 94  83  85   Total Bilirubin 0.0 - 1.2 mg/dL 0.5  0.5  0.5       Current Medications:   Current Outpatient Medications (Endocrine & Metabolic):    RYBELSUS 7 MG TABS,    SYNJARDY 12.5-500 MG TABS, Take 1 tablet by mouth daily.  Current Outpatient Medications (Cardiovascular):    amLODipine (NORVASC) 2.5 MG tablet, Take 2.5 mg by mouth daily.   rosuvastatin (CRESTOR) 10 MG tablet, Take 10 mg by mouth at bedtime.   sildenafil (VIAGRA) 100 MG tablet, Take 100 mg by mouth daily as needed (ED).   tadalafil (CIALIS) 5 MG tablet, Take 1 tablet (5 mg total) by mouth daily as needed for erectile dysfunction.  Current Outpatient Medications (Respiratory):    azelastine (ASTELIN) 0.1 %  nasal spray,    fluticasone (FLONASE) 50 MCG/ACT nasal spray, Place 2 sprays into both nostrils in the morning and at bedtime.  Current Outpatient Medications (Analgesics):    aspirin 81 MG EC tablet, 1 tablet   Current Outpatient Medications (Other):    Continuous Glucose Sensor (FREESTYLE LIBRE 3 SENSOR) MISC, USE AS DIRECTED. CHANGE EVERY 14 DAYS   diclofenac Sodium (VOLTAREN) 1 % GEL, Apply 2 g topically 4 (four) times daily as needed for pain.   Multiple Vitamins-Minerals (CENTRUM SILVER PO), Take 1 tablet by mouth at bedtime.   Na Sulfate-K Sulfate-Mg Sulfate concentrate 17.5-3.13-1.6 GM/177ML SOLN, Take 1 kit by mouth once for 1 dose.   olopatadine (PATANOL) 0.1 % ophthalmic solution, Apply to eye.  Medical History:  Past Medical History:  Diagnosis Date   Arthritis    Hyperlipidemia    Hypertension    followed by pcp  (11-23-2019  per pt had stress test greater than 20 yrs ago, told ok)   Nocturia    OSA on CPAP    per pt uses nightly   Phimosis    Type 2 diabetes mellitus (HCC)    followed by pcp   (11-23-2019 per pt does not check blood sugar at home)   Wears glasses    Allergies: No Known Allergies   Surgical History:  He  has a past surgical history that includes Appendectomy (child); Rotator cuff repair (Left, chld); Circumcision (N/A, 11/29/2019); Cholecystectomy (N/A, 07/09/2020); Liver biopsy (N/A, 07/09/2020); Supra-umbilical hernia (N/A, 07/09/2020); IR IMAGING GUIDED PORT INSERTION (08/06/2020); and IR REMOVAL TUN ACCESS W/ PORT W/O FL MOD SED (04/09/2021). Family History:  His family history includes Colon cancer in his sister; Diabetes Mellitus II in his father; Hypotension in his mother; Prostate cancer in his brother and brother.  REVIEW OF SYSTEMS  : All other systems reviewed and negative except where noted in the History of Present Illness.  PHYSICAL EXAM: BP 132/70 (BP Location: Right Arm, Patient Position: Sitting, Cuff Size: Large)   Pulse 100   Ht 5'  10" (1.778 m)   Wt 246 lb 6 oz (111.8 kg)   BMI 35.35 kg/m  General Appearance: Well nourished, in no apparent distress. Head:   Normocephalic and atraumatic. Eyes:  sclerae anicteric,conjunctive pink  Respiratory: Respiratory effort normal, BS equal bilaterally without rales, rhonchi, wheezing. Cardio: RRR with no MRGs. Peripheral pulses intact.  Abdomen: Soft,  Obese ,active bowel sounds. No tenderness . Without guarding and Without rebound. No masses. Rectal: declines Musculoskeletal: Full ROM, Normal gait. Without edema. Skin:  Dry and intact without significant lesions or rashes Neuro: Alert and  oriented x4;  No focal deficits. Psych:  Cooperative. Normal mood and affect.    Doree Albee, PA-C 9:31 AM

## 2023-02-09 ENCOUNTER — Other Ambulatory Visit (INDEPENDENT_AMBULATORY_CARE_PROVIDER_SITE_OTHER): Payer: TRICARE For Life (TFL)

## 2023-02-09 ENCOUNTER — Encounter: Payer: Self-pay | Admitting: Physician Assistant

## 2023-02-09 ENCOUNTER — Encounter: Payer: Self-pay | Admitting: Hematology and Oncology

## 2023-02-09 ENCOUNTER — Ambulatory Visit (INDEPENDENT_AMBULATORY_CARE_PROVIDER_SITE_OTHER): Payer: Medicare (Managed Care) | Admitting: Physician Assistant

## 2023-02-09 VITALS — BP 132/70 | HR 100 | Ht 70.0 in | Wt 246.4 lb

## 2023-02-09 DIAGNOSIS — E538 Deficiency of other specified B group vitamins: Secondary | ICD-10-CM

## 2023-02-09 DIAGNOSIS — Z860100 Personal history of colon polyps, unspecified: Secondary | ICD-10-CM | POA: Diagnosis not present

## 2023-02-09 DIAGNOSIS — K921 Melena: Secondary | ICD-10-CM

## 2023-02-09 DIAGNOSIS — C8442 Peripheral T-cell lymphoma, not classified, intrathoracic lymph nodes: Secondary | ICD-10-CM | POA: Diagnosis not present

## 2023-02-09 DIAGNOSIS — Z800 Family history of malignant neoplasm of digestive organs: Secondary | ICD-10-CM

## 2023-02-09 DIAGNOSIS — E1165 Type 2 diabetes mellitus with hyperglycemia: Secondary | ICD-10-CM

## 2023-02-09 DIAGNOSIS — Z860101 Personal history of adenomatous and serrated colon polyps: Secondary | ICD-10-CM

## 2023-02-09 DIAGNOSIS — G4733 Obstructive sleep apnea (adult) (pediatric): Secondary | ICD-10-CM | POA: Diagnosis not present

## 2023-02-09 LAB — CBC WITH DIFFERENTIAL/PLATELET
Basophils Absolute: 0.1 10*3/uL (ref 0.0–0.1)
Basophils Relative: 1.4 % (ref 0.0–3.0)
Eosinophils Absolute: 0.2 10*3/uL (ref 0.0–0.7)
Eosinophils Relative: 3.5 % (ref 0.0–5.0)
HCT: 40.2 % (ref 39.0–52.0)
Hemoglobin: 13.1 g/dL (ref 13.0–17.0)
Lymphocytes Relative: 30.9 % (ref 12.0–46.0)
Lymphs Abs: 1.6 10*3/uL (ref 0.7–4.0)
MCHC: 32.5 g/dL (ref 30.0–36.0)
MCV: 83.7 fL (ref 78.0–100.0)
Monocytes Absolute: 0.5 10*3/uL (ref 0.1–1.0)
Monocytes Relative: 8.8 % (ref 3.0–12.0)
Neutro Abs: 2.9 10*3/uL (ref 1.4–7.7)
Neutrophils Relative %: 55.4 % (ref 43.0–77.0)
Platelets: 201 10*3/uL (ref 150.0–400.0)
RBC: 4.8 Mil/uL (ref 4.22–5.81)
RDW: 16.6 % — ABNORMAL HIGH (ref 11.5–15.5)
WBC: 5.3 10*3/uL (ref 4.0–10.5)

## 2023-02-09 LAB — IBC + FERRITIN
Ferritin: 4.4 ng/mL — ABNORMAL LOW (ref 22.0–322.0)
Iron: 39 ug/dL — ABNORMAL LOW (ref 42–165)
Saturation Ratios: 8.2 % — ABNORMAL LOW (ref 20.0–50.0)
TIBC: 473.2 ug/dL — ABNORMAL HIGH (ref 250.0–450.0)
Transferrin: 338 mg/dL (ref 212.0–360.0)

## 2023-02-09 LAB — VITAMIN B12: Vitamin B-12: 1104 pg/mL — ABNORMAL HIGH (ref 211–911)

## 2023-02-09 MED ORDER — NA SULFATE-K SULFATE-MG SULF 17.5-3.13-1.6 GM/177ML PO SOLN: 1.0000 | Freq: Once | ORAL | 0 refills | Status: AC

## 2023-02-09 NOTE — Progress Notes (Signed)
I agree with the assessment and plan as outlined by Ms. Collier. 

## 2023-02-09 NOTE — Patient Instructions (Addendum)
Your provider has requested that you go to the basement level for lab work before leaving today. Press "B" on the elevator. The lab is located at the first door on the left as you exit the elevator.  Due to recent changes in healthcare laws, you may see the results of your imaging and laboratory studies on MyChart before your provider has had a chance to review them.  We understand that in some cases there may be results that are confusing or concerning to you. Not all laboratory results come back in the same time frame and the provider may be waiting for multiple results in order to interpret others.  Please give [place] 48 hours in order for your provider to thoroughly review all the results before contacting the office for clarification of your results.    _______________________________________________________  If your blood pressure at your visit was 140/90 or greater, please contact your primary care physician to follow up on this.  _______________________________________________________  If you are age 73 or older, your body mass index should be between 23-30. Your Body mass index is 35.35 kg/m. If this is out of the aforementioned range listed, please consider follow up with your Primary Care Provider.  If you are age 31 or younger, your body mass index should be between 19-25. Your Body mass index is 35.35 kg/m. If this is out of the aformentioned range listed, please consider follow up with your Primary Care Provider.   ________________________________________________________  The [facility] providers would like to encourage you to use [facility] to communicate with providers for non-urgent requests or questions.  Due to long hold times on the telephone, sending your provider a message by [facility] may be a faster and more efficient way to get a response.  Please allow 48 business hours for a response.  Please remember that this is for non-urgent requests.   _______________________________________________________   Bonita Quin have been scheduled for an endoscopy and colonoscopy. Please follow the written instructions given to you at your visit today.  Please pick up your prep supplies at the pharmacy within the next 1-3 days.  If you use inhalers (even only as needed), please bring them with you on the day of your procedure.  DO NOT TAKE 7 DAYS PRIOR TO TEST- Trulicity (dulaglutide) Ozempic, Wegovy (semaglutide) Mounjaro (tirzepatide) Bydureon Bcise (exanatide extended release)  DO NOT TAKE 1 DAY PRIOR TO YOUR TEST Rybelsus (semaglutide) Adlyxin (lixisenatide) Victoza (liraglutide) Byetta (exanatide) ___________________________________________________________________________   I appreciate the  opportunity to care for you  Thank You   [facility]

## 2023-02-10 ENCOUNTER — Encounter: Payer: Self-pay | Admitting: Hematology and Oncology

## 2023-02-11 ENCOUNTER — Telehealth: Payer: Self-pay | Admitting: Physician Assistant

## 2023-02-11 DIAGNOSIS — J309 Allergic rhinitis, unspecified: Secondary | ICD-10-CM | POA: Diagnosis not present

## 2023-02-11 DIAGNOSIS — E1165 Type 2 diabetes mellitus with hyperglycemia: Secondary | ICD-10-CM | POA: Diagnosis not present

## 2023-02-11 DIAGNOSIS — I10 Essential (primary) hypertension: Secondary | ICD-10-CM | POA: Diagnosis not present

## 2023-02-11 DIAGNOSIS — Z0000 Encounter for general adult medical examination without abnormal findings: Secondary | ICD-10-CM | POA: Diagnosis not present

## 2023-02-11 NOTE — Telephone Encounter (Signed)
Scheduled appointments per scheduling message. Patient is aware of the made appointments and will be mailed an appointment reminder.

## 2023-02-25 DIAGNOSIS — R911 Solitary pulmonary nodule: Secondary | ICD-10-CM | POA: Diagnosis not present

## 2023-02-25 DIAGNOSIS — I10 Essential (primary) hypertension: Secondary | ICD-10-CM | POA: Diagnosis not present

## 2023-02-25 DIAGNOSIS — Z720 Tobacco use: Secondary | ICD-10-CM | POA: Diagnosis not present

## 2023-02-25 DIAGNOSIS — E782 Mixed hyperlipidemia: Secondary | ICD-10-CM | POA: Diagnosis not present

## 2023-02-25 DIAGNOSIS — I2510 Atherosclerotic heart disease of native coronary artery without angina pectoris: Secondary | ICD-10-CM | POA: Diagnosis not present

## 2023-02-25 DIAGNOSIS — Z789 Other specified health status: Secondary | ICD-10-CM | POA: Diagnosis not present

## 2023-02-25 DIAGNOSIS — E1165 Type 2 diabetes mellitus with hyperglycemia: Secondary | ICD-10-CM | POA: Diagnosis not present

## 2023-03-01 ENCOUNTER — Encounter: Payer: Self-pay | Admitting: Hematology and Oncology

## 2023-03-10 DIAGNOSIS — Z720 Tobacco use: Secondary | ICD-10-CM | POA: Diagnosis not present

## 2023-03-10 DIAGNOSIS — I2510 Atherosclerotic heart disease of native coronary artery without angina pectoris: Secondary | ICD-10-CM | POA: Diagnosis not present

## 2023-03-10 DIAGNOSIS — I10 Essential (primary) hypertension: Secondary | ICD-10-CM | POA: Diagnosis not present

## 2023-03-10 DIAGNOSIS — E1165 Type 2 diabetes mellitus with hyperglycemia: Secondary | ICD-10-CM | POA: Diagnosis not present

## 2023-03-17 ENCOUNTER — Ambulatory Visit: Payer: Medicare (Managed Care) | Admitting: Internal Medicine

## 2023-03-17 ENCOUNTER — Encounter: Payer: Self-pay | Admitting: Internal Medicine

## 2023-03-17 ENCOUNTER — Other Ambulatory Visit: Payer: Self-pay | Admitting: Internal Medicine

## 2023-03-17 VITALS — BP 114/78 | HR 96 | Temp 97.8°F | Resp 19 | Ht 70.0 in | Wt 246.0 lb

## 2023-03-17 DIAGNOSIS — D123 Benign neoplasm of transverse colon: Secondary | ICD-10-CM | POA: Diagnosis not present

## 2023-03-17 DIAGNOSIS — K2950 Unspecified chronic gastritis without bleeding: Secondary | ICD-10-CM | POA: Diagnosis not present

## 2023-03-17 DIAGNOSIS — K635 Polyp of colon: Secondary | ICD-10-CM

## 2023-03-17 DIAGNOSIS — K5140 Inflammatory polyps of colon without complications: Secondary | ICD-10-CM | POA: Diagnosis not present

## 2023-03-17 DIAGNOSIS — K648 Other hemorrhoids: Secondary | ICD-10-CM

## 2023-03-17 DIAGNOSIS — D128 Benign neoplasm of rectum: Secondary | ICD-10-CM

## 2023-03-17 DIAGNOSIS — Z860100 Personal history of colon polyps, unspecified: Secondary | ICD-10-CM | POA: Diagnosis not present

## 2023-03-17 DIAGNOSIS — K449 Diaphragmatic hernia without obstruction or gangrene: Secondary | ICD-10-CM

## 2023-03-17 DIAGNOSIS — K921 Melena: Secondary | ICD-10-CM | POA: Diagnosis not present

## 2023-03-17 DIAGNOSIS — K621 Rectal polyp: Secondary | ICD-10-CM | POA: Diagnosis not present

## 2023-03-17 DIAGNOSIS — K2289 Other specified disease of esophagus: Secondary | ICD-10-CM | POA: Diagnosis not present

## 2023-03-17 DIAGNOSIS — Z1211 Encounter for screening for malignant neoplasm of colon: Secondary | ICD-10-CM | POA: Diagnosis not present

## 2023-03-17 DIAGNOSIS — D122 Benign neoplasm of ascending colon: Secondary | ICD-10-CM | POA: Diagnosis not present

## 2023-03-17 DIAGNOSIS — K2970 Gastritis, unspecified, without bleeding: Secondary | ICD-10-CM

## 2023-03-17 DIAGNOSIS — D124 Benign neoplasm of descending colon: Secondary | ICD-10-CM

## 2023-03-17 DIAGNOSIS — K2951 Unspecified chronic gastritis with bleeding: Secondary | ICD-10-CM | POA: Diagnosis not present

## 2023-03-17 MED ORDER — PANTOPRAZOLE SODIUM 40 MG PO TBEC: 40.0000 mg | DELAYED_RELEASE_TABLET | Freq: Two times a day (BID) | ORAL | 1 refills | Status: DC

## 2023-03-17 MED ORDER — SODIUM CHLORIDE 0.9 % IV SOLN
500.0000 mL | Freq: Once | INTRAVENOUS | Status: DC
Start: 2023-03-17 — End: 2023-03-17

## 2023-03-17 NOTE — Progress Notes (Signed)
 GASTROENTEROLOGY PROCEDURE H&P NOTE   Primary Care Physician: Lewis Moccasin, MD    Reason for Procedure:   Melena, Hb drop, family history of colon cancer, history of colon polyps  Plan:    EGD/colonoscopy  Patient is appropriate for endoscopic procedure(s) in the ambulatory ([facility]) setting.  The nature of the procedure, as well as the risks, benefits, and alternatives were carefully and thoroughly reviewed with the patient. Ample time for discussion and questions allowed. The patient understood, was satisfied, and agreed to proceed.     HPI: QUINTAVIUS NIEBUHR Sr. is a 73 y.o. male who presents for EGD/colonoscopy for evaluation of melena, Hb drop, family history of colon cancer, and history of colon polyps .  Patient was most recently seen in the Gastroenterology Clinic on 02/09/23.  No interval change in medical history since that appointment. He has never had an EGD in the past. He thinks his last colonoscopy was in 2019 with two colon polyps, done at the [place]. Please refer to that note for full details regarding GI history and clinical presentation.   Past Medical History:  Diagnosis Date   Arthritis    Cancer (HCC)    Hyperlipidemia    Hypertension    followed by pcp  (11-23-2019  per pt had stress test greater than 20 yrs ago, told ok)   Nocturia    OSA on CPAP    per pt uses nightly   Phimosis    Sleep apnea    Type 2 diabetes mellitus (HCC)    followed by pcp   (11-23-2019 per pt does not check blood sugar at home)   Wears glasses     Past Surgical History:  Procedure Laterality Date   APPENDECTOMY  child   CHOLECYSTECTOMY N/A 07/09/2020   Procedure: LAPAROSCOPIC CHOLECYSTECTOMY WITH INTRAOPERATIVE CHOLANGIOGRAM,;  Surgeon: [name], MD;  Location: [name];  Service: General;  Laterality: N/A;   CIRCUMCISION N/A 11/29/2019   Procedure: CIRCUMCISION ADULT;  Surgeon: [name], MD;  Location: [facility];  Service: Urology;  Laterality: N/A;    IR IMAGING GUIDED PORT INSERTION  08/06/2020   IR REMOVAL TUN ACCESS W/ PORT W/O FL MOD SED  04/09/2021   LIVER BIOPSY N/A 07/09/2020   Procedure: LAP LIVER BIOPSY;  Surgeon: [name], MD;  Location: [facility];  Service: General;  Laterality: N/A;   ROTATOR CUFF REPAIR Left chld   SUPRA-UMBILICAL HERNIA N/A 07/09/2020   Procedure: PRIMARY REPAIR SUPRA-UMBILICAL HERNIA;  Surgeon: [name], MD;  Location: [facility];  Service: General;  Laterality: N/A;    Prior to Admission medications   Medication Sig Start Date End Date Taking? Authorizing Provider  azelastine (ASTELIN) 0.1 % nasal spray    Yes [provider]  buPROPion (WELLBUTRIN XL) 150 MG 24 hr tablet Take 150 mg by mouth every morning. 02/14/23  Yes [provider]  Continuous Glucose Sensor (FREESTYLE LIBRE 3 SENSOR) MISC USE AS DIRECTED. CHANGE EVERY 14 DAYS 02/04/23  Yes [provider]  fluticasone (FLONASE) 50 MCG/ACT nasal spray Place 2 sprays into both nostrils in the morning and at bedtime.   Yes [provider]  LANTUS SOLOSTAR 100 UNIT/ML Solostar Pen Inject 10 Units into the skin daily. 02/27/23  Yes [provider]  metFORMIN (GLUCOPHAGE) 1000 MG tablet Take 1,000 mg by mouth 2 (two) times daily. 02/11/23  Yes [provider]  olopatadine (PATANOL) 0.1 % ophthalmic solution Apply to eye. 07/28/22  Yes [provider]  tamsulosin (FLOMAX) 0.4 MG CAPS capsule Take 0.4 mg by mouth daily. 03/09/23  Yes [provider]  valsartan-hydrochlorothiazide (DIOVAN-HCT) 320-25 MG tablet Take 1 tablet by mouth daily. 03/09/23  Yes [provider]  amLODipine (NORVASC) 2.5 MG tablet Take 2.5 mg by mouth daily. 12/24/22   [provider]  aspirin 81 MG EC tablet 1 tablet    [provider]  diclofenac Sodium (VOLTAREN) 1 % GEL Apply 2 g topically 4 (four) times daily as needed for pain. Patient not taking: Reported on 03/17/2023 03/28/20   [provider]  Multiple Vitamins-Minerals (CENTRUM SILVER PO) Take 1 tablet by mouth at bedtime.    [provider]  rosuvastatin (CRESTOR) 10 MG tablet Take 10 mg by mouth at bedtime. 01/01/23   [provider]  RYBELSUS 7 MG TABS  12/17/21   [provider]  sildenafil (VIAGRA) 100 MG tablet Take 100 mg by mouth daily as needed (ED).    [provider]  SYNJARDY 12.5-500 MG TABS Take 1 tablet by mouth daily. 09/20/22   [provider]  tadalafil (CIALIS) 5 MG tablet Take 1 tablet (5 mg total) by mouth daily as needed for erectile dysfunction. 10/04/21   Jaci Standard, MD    Current Outpatient Medications  Medication Sig Dispense Refill   azelastine (ASTELIN) 0.1 % nasal spray      buPROPion (WELLBUTRIN XL) 150 MG 24 hr tablet Take 150 mg by mouth every morning.     Continuous Glucose Sensor (FREESTYLE LIBRE 3 SENSOR) MISC USE AS DIRECTED. CHANGE EVERY 14 DAYS     fluticasone (FLONASE) 50 MCG/ACT nasal spray Place 2 sprays into both nostrils in the morning and at bedtime.     LANTUS SOLOSTAR 100 UNIT/ML Solostar Pen Inject 10 Units into the skin daily.     metFORMIN (GLUCOPHAGE) 1000 MG tablet Take 1,000 mg by mouth 2 (two) times daily.     olopatadine (PATANOL) 0.1 % ophthalmic solution Apply to eye.     tamsulosin (FLOMAX) 0.4 MG CAPS capsule Take 0.4 mg by mouth daily.     valsartan-hydrochlorothiazide (DIOVAN-HCT) 320-25 MG tablet Take 1 tablet by mouth daily.     amLODipine (NORVASC) 2.5 MG tablet Take 2.5 mg by mouth daily.     aspirin 81 MG EC tablet 1 tablet     diclofenac Sodium (VOLTAREN) 1 % GEL Apply 2 g topically 4 (four) times daily as needed for pain. (Patient not taking: Reported on 03/17/2023)     Multiple Vitamins-Minerals (CENTRUM SILVER PO) Take 1 tablet by mouth at bedtime.     rosuvastatin (CRESTOR) 10 MG tablet Take 10 mg by mouth at bedtime.     RYBELSUS 7 MG TABS      sildenafil (VIAGRA) 100 MG tablet Take 100 mg by  mouth daily as needed (ED).     SYNJARDY 12.5-500 MG TABS Take 1 tablet by mouth daily.     tadalafil (CIALIS) 5 MG tablet Take 1 tablet (5 mg total) by mouth daily as needed for erectile dysfunction. 10 tablet 0   Current Facility-Administered Medications  Medication Dose Route Frequency Provider Last Rate Last Admin   0.9 %  sodium chloride infusion  500 mL Intravenous Once Imogene Burn, MD        Allergies as of 03/17/2023   (No Known Allergies)    Family History  Problem Relation Age of Onset   Hypotension Mother    Diabetes Mellitus II  Father    Colon cancer Sister    Prostate cancer Brother    Prostate cancer Brother     Social History   Socioeconomic History   Marital status: Divorced    Spouse name: Not on file   Number of children: Not on file   Years of education: Not on file   Highest education level: Not on file  Occupational History   Not on file  Tobacco Use   Smoking status: Every Day    Current packs/day: 1.00    Average packs/day: 1 pack/day for 49.0 years (49.0 ttl pk-yrs)    Types: Cigars, Cigarettes   Smokeless tobacco: Never  Vaping Use   Vaping status: Never Used  Substance and Sexual Activity   Alcohol use: Not Currently   Drug use: Never   Sexual activity: Not on file  Other Topics Concern   Not on file  Social History Narrative   Not on file   Social Drivers of Health   Financial Resource Strain: Not on file  Food Insecurity: Not on file  Transportation Needs: Not on file  Physical Activity: Not on file  Stress: Not on file  Social Connections: Unknown (06/03/2021)   [facility]   Social Network    Social Network: Not on file  Intimate Partner Violence: Unknown (04/25/2021)   Received from [facility]   HITS    Physically Hurt: Not on file    Insult or Talk Down To: Not on file    Threaten Physical Harm: Not on file    Scream or Curse: Not on file    Physical Exam: Vital signs  in last 24 hours: BP (!) 118/59   Pulse (!) 103   Temp 97.8 F (36.6 C)   Ht 5\' 10"  (1.778 m)   Wt 246 lb (111.6 kg)   SpO2 97%   BMI 35.30 kg/m  GEN: NAD EYE: Sclerae anicteric ENT: MMM CV: Non-tachycardic Pulm: No increased WOB GI: Soft NEURO:  Alert & Oriented   Eulah Pont, MD [facility]   03/17/2023 1:35 PM

## 2023-03-17 NOTE — Patient Instructions (Addendum)
 Handouts Provided:  Polyps  Use Protonix (pantoprazole) 40 mg by mouth twice daily for 8 weeks then decrease to once daily.  AVOID NSAIDs if possible  Please see scheduled follow up appointment.  YOU HAD AN ENDOSCOPIC PROCEDURE TODAY AT THE [facility]:   Refer to the procedure report that was given to you for any specific questions about what was found during the examination.  If the procedure report does not answer your questions, please call your gastroenterologist to clarify.  If you requested that your care partner not be given the details of your procedure findings, then the procedure report has been included in a sealed envelope for you to review at your convenience later.  YOU SHOULD EXPECT: Some feelings of bloating in the abdomen. Passage of more gas than usual.  Walking can help get rid of the air that was put into your GI tract during the procedure and reduce the bloating. If you had a lower endoscopy (such as a colonoscopy or flexible sigmoidoscopy) you may notice spotting of blood in your stool or on the toilet paper. If you underwent a bowel prep for your procedure, you may not have a normal bowel movement for a few days.  Please Note:  You might notice some irritation and congestion in your nose or some drainage.  This is from the oxygen used during your procedure.  There is no need for concern and it should clear up in a day or so.  SYMPTOMS TO REPORT IMMEDIATELY:  Following lower endoscopy (colonoscopy or flexible sigmoidoscopy):  Excessive amounts of blood in the stool  Significant tenderness or worsening of abdominal pains  Swelling of the abdomen that is new, acute  Fever of 100F or higher  Following upper endoscopy (EGD)  Vomiting of blood or coffee ground material  New chest pain or pain under the shoulder blades  Painful or persistently difficult swallowing  New shortness of breath  Fever of 100F or higher  Black, tarry-looking stools  For urgent  or emergent issues, a gastroenterologist can be reached at any hour by calling [phone]. Do not use MyChart messaging for urgent concerns.    DIET:  We do recommend a small meal at first, but then you may proceed to your regular diet.  Drink plenty of fluids but you should avoid alcoholic beverages for 24 hours.  ACTIVITY:  You should plan to take it easy for the rest of today and you should NOT DRIVE or use heavy machinery until tomorrow (because of the sedation medicines used during the test).    FOLLOW UP: Our staff will call the number listed on your records the next business day following your procedure.  We will call around 7:15- 8:00 am to check on you and address any questions or concerns that you may have regarding the information given to you following your procedure. If we do not reach you, we will leave a message.     If any biopsies were taken you will be contacted by phone or by letter within the next 1-3 weeks.  Please call us at [phone] if you have not heard about the biopsies in 3 weeks.    SIGNATURES/CONFIDENTIALITY: You and/or your care partner have signed paperwork which will be entered into your electronic medical record.  These signatures attest to the fact that that the information above on your After Visit Summary has been reviewed and is understood.  Full responsibility of the confidentiality of this discharge information lies with  you and/or your care-partner.

## 2023-03-17 NOTE — Progress Notes (Signed)
 Called to room to assist during endoscopic procedure.  Patient ID and intended procedure confirmed with present staff. Received instructions for my participation in the procedure from the performing physician.

## 2023-03-17 NOTE — Op Note (Signed)
 [facility] Patient Name: Maurice Fox Procedure Date: 03/17/2023 1:48 PM MRN: [id number] Endoscopist: Madelyn Brunner [place] , , [phone] Age: 73 Referring MD:  Date of Birth: 01/12/1951 Gender: Male Account #: [id number] Procedure:                Upper GI endoscopy Indications:              Melena Medicines:                Monitored Anesthesia Care Procedure:                Pre-Anesthesia Assessment:                           - Prior to the procedure, a History and Physical                            was performed, and patient medications and                            allergies were reviewed. The patient's tolerance of                            previous anesthesia was also reviewed. The risks                            and benefits of the procedure and the sedation                            options and risks were discussed with the patient.                            All questions were answered, and informed consent                            was obtained. Prior Anticoagulants: The patient has                            taken no anticoagulant or antiplatelet agents. ASA                            Grade Assessment: II - A patient with mild systemic                            disease. After reviewing the risks and benefits,                            the patient was deemed in satisfactory condition to                            undergo the procedure.                           After obtaining informed consent, the endoscope was  passed under direct vision. Throughout the                            procedure, the patient's blood pressure, pulse, and                            oxygen saturations were monitored continuously. The                            Olympus scope [id number] was introduced through the                            mouth, and advanced to the second part of duodenum.                            The upper GI endoscopy was accomplished  without                            difficulty. The patient tolerated the procedure                            well. Scope In: Scope Out: Findings:                 Localized mild mucosal variance characterized by                            congestion, erythema and an increased vascular                            pattern was found at the gastroesophageal junction.                            Biopsies were taken with a cold forceps for                            histology.                           A small hiatal hernia was present.                           Localized inflammation with hemorrhage                            characterized by congestion (edema), erosions,                            erythema and nodularity was found in the gastric                            fundus and in the gastric antrum. Biopsies were                            taken with a cold forceps for histology.  Localized mild inflammation characterized by                            congestion (edema), erosions and erythema was found                            in the duodenal bulb. Complications:            No immediate complications. Estimated Blood Loss:     Estimated blood loss was minimal. Impression:               - Esophageal mucosal variant. Biopsied.                           - Small hiatal hernia.                           - Gastritis with hemorrhage. Biopsied.                           - Duodenitis. Recommendation:           - Discharge patient to home (with escort).                           - Await pathology results.                           - Use Protonix (pantoprazole) 40 mg PO BID for 8                            weeks, then QD.                           - Avoid NSAIDs if possible.                           - The findings and recommendations were discussed                            with the patient. Dr Particia Lather "[address] [place],  03/17/2023 2:34:43 PM

## 2023-03-17 NOTE — Progress Notes (Signed)
 Sedate, gd SR, tolerated procedure well, VSS, report to RN

## 2023-03-17 NOTE — Progress Notes (Signed)
 Pt's states no medical or surgical changes since previsit or office visit.

## 2023-03-17 NOTE — Op Note (Signed)
 [facility] Patient Name: Maurice Fox Procedure Date: 03/17/2023 1:39 PM MRN: [id number] Endoscopist: Madelyn Brunner [place] , , [id number] Age: 73 Referring MD:  Date of Birth: September 19, 1950 Gender: Male Account #: [id number] Procedure:                Colonoscopy Indications:              High risk colon cancer surveillance: Personal                            history of colonic polyps Medicines:                Monitored Anesthesia Care Procedure:                Pre-Anesthesia Assessment:                           - Prior to the procedure, a History and Physical                            was performed, and patient medications and                            allergies were reviewed. The patient's tolerance of                            previous anesthesia was also reviewed. The risks                            and benefits of the procedure and the sedation                            options and risks were discussed with the patient.                            All questions were answered, and informed consent                            was obtained. Prior Anticoagulants: The patient has                            taken no anticoagulant or antiplatelet agents. ASA                            Grade Assessment: II - A patient with mild systemic                            disease. After reviewing the risks and benefits,                            the patient was deemed in satisfactory condition to                            undergo the procedure.  After obtaining informed consent, the colonoscope                            was passed under direct vision. Throughout the                            procedure, the patient's blood pressure, pulse, and                            oxygen saturations were monitored continuously. The                            Olympus Scope SN [id number] was introduced through the                            anus and advanced to the the  terminal ileum. The                            colonoscopy was performed without difficulty. The                            patient tolerated the procedure well. The quality                            of the bowel preparation was good. The terminal                            ileum, ileocecal valve, appendiceal orifice, and                            rectum were photographed. Scope In: 2:03:12 PM Scope Out: 2:27:05 PM Scope Withdrawal Time: 0 hours 19 minutes 29 seconds  Total Procedure Duration: 0 hours 23 minutes 53 seconds  Findings:                 The terminal ileum appeared normal.                           Four sessile polyps were found in the transverse                            colon and ascending colon. The polyps were 4 to 6                            mm in size. These polyps were removed with a cold                            snare. Resection and retrieval were complete.                           Two sessile polyps were found in the rectum and                            descending colon. The  polyps were 3 to 5 mm in                            size. These polyps were removed with a cold snare.                            Resection and retrieval were complete.                           Non-bleeding internal hemorrhoids were found during                            retroflexion. Complications:            No immediate complications. Estimated Blood Loss:     Estimated blood loss was minimal. Impression:               - The examined portion of the ileum was normal.                           - Four 4 to 6 mm polyps in the transverse colon and                            in the ascending colon, removed with a cold snare.                            Resected and retrieved.                           - Two 3 to 5 mm polyps in the rectum and in the                            descending colon, removed with a cold snare.                            Resected and retrieved.                            - Non-bleeding internal hemorrhoids. Recommendation:           - Discharge patient to home (with escort).                           - Await pathology results.                           - Return to GI clinic in 2-3 months for follow up                            of melena.                           - The findings and recommendations were discussed                            with the  patient. Dr Particia Lather "[address]" [place],  03/17/2023 2:37:45 PM

## 2023-03-18 ENCOUNTER — Telehealth: Payer: Self-pay | Admitting: *Deleted

## 2023-03-18 NOTE — Telephone Encounter (Signed)
  Follow up Call-     03/17/2023    1:05 PM  Call back number  Post procedure Call Back phone  # [phone]  Permission to leave phone message Yes     Patient questions:  Message left to call us if necessary.

## 2023-03-20 ENCOUNTER — Encounter: Payer: Self-pay | Admitting: Internal Medicine

## 2023-03-20 DIAGNOSIS — I10 Essential (primary) hypertension: Secondary | ICD-10-CM | POA: Diagnosis not present

## 2023-03-20 DIAGNOSIS — R5383 Other fatigue: Secondary | ICD-10-CM | POA: Diagnosis not present

## 2023-03-20 DIAGNOSIS — Z125 Encounter for screening for malignant neoplasm of prostate: Secondary | ICD-10-CM | POA: Diagnosis not present

## 2023-03-20 LAB — SURGICAL PATHOLOGY

## 2023-03-25 ENCOUNTER — Encounter: Payer: Self-pay | Admitting: Hematology and Oncology

## 2023-03-26 ENCOUNTER — Other Ambulatory Visit: Payer: Self-pay | Admitting: Physician Assistant

## 2023-03-26 DIAGNOSIS — E1165 Type 2 diabetes mellitus with hyperglycemia: Secondary | ICD-10-CM | POA: Diagnosis not present

## 2023-03-26 DIAGNOSIS — D649 Anemia, unspecified: Secondary | ICD-10-CM | POA: Diagnosis not present

## 2023-03-26 DIAGNOSIS — D509 Iron deficiency anemia, unspecified: Secondary | ICD-10-CM | POA: Diagnosis not present

## 2023-03-26 DIAGNOSIS — Z720 Tobacco use: Secondary | ICD-10-CM | POA: Diagnosis not present

## 2023-03-26 DIAGNOSIS — I2510 Atherosclerotic heart disease of native coronary artery without angina pectoris: Secondary | ICD-10-CM | POA: Diagnosis not present

## 2023-03-26 DIAGNOSIS — R972 Elevated prostate specific antigen [PSA]: Secondary | ICD-10-CM | POA: Diagnosis not present

## 2023-03-26 DIAGNOSIS — I700 Atherosclerosis of aorta: Secondary | ICD-10-CM | POA: Diagnosis not present

## 2023-03-26 DIAGNOSIS — C8442 Peripheral T-cell lymphoma, not classified, intrathoracic lymph nodes: Secondary | ICD-10-CM

## 2023-03-27 ENCOUNTER — Telehealth: Payer: Self-pay | Admitting: Hematology and Oncology

## 2023-03-27 ENCOUNTER — Inpatient Hospital Stay: Payer: Medicare (Managed Care) | Attending: Hematology and Oncology

## 2023-03-27 ENCOUNTER — Inpatient Hospital Stay (HOSPITAL_BASED_OUTPATIENT_CLINIC_OR_DEPARTMENT_OTHER): Payer: Medicare (Managed Care) | Admitting: Physician Assistant

## 2023-03-27 VITALS — BP 137/69 | HR 81 | Temp 97.6°F | Resp 16 | Wt 250.2 lb

## 2023-03-27 DIAGNOSIS — Z9221 Personal history of antineoplastic chemotherapy: Secondary | ICD-10-CM | POA: Diagnosis not present

## 2023-03-27 DIAGNOSIS — F17210 Nicotine dependence, cigarettes, uncomplicated: Secondary | ICD-10-CM | POA: Diagnosis not present

## 2023-03-27 DIAGNOSIS — C8442 Peripheral T-cell lymphoma, not classified, intrathoracic lymph nodes: Secondary | ICD-10-CM

## 2023-03-27 DIAGNOSIS — D500 Iron deficiency anemia secondary to blood loss (chronic): Secondary | ICD-10-CM

## 2023-03-27 DIAGNOSIS — E611 Iron deficiency: Secondary | ICD-10-CM | POA: Insufficient documentation

## 2023-03-27 DIAGNOSIS — Z79899 Other long term (current) drug therapy: Secondary | ICD-10-CM | POA: Diagnosis not present

## 2023-03-27 DIAGNOSIS — Z8572 Personal history of non-Hodgkin lymphomas: Secondary | ICD-10-CM | POA: Insufficient documentation

## 2023-03-27 LAB — CBC WITH DIFFERENTIAL (CANCER CENTER ONLY)
Abs Immature Granulocytes: 0.01 10*3/uL (ref 0.00–0.07)
Basophils Absolute: 0 10*3/uL (ref 0.0–0.1)
Basophils Relative: 1 %
Eosinophils Absolute: 0.3 10*3/uL (ref 0.0–0.5)
Eosinophils Relative: 6 %
HCT: 37.9 % — ABNORMAL LOW (ref 39.0–52.0)
Hemoglobin: 12.1 g/dL — ABNORMAL LOW (ref 13.0–17.0)
Immature Granulocytes: 0 %
Lymphocytes Relative: 35 %
Lymphs Abs: 1.7 10*3/uL (ref 0.7–4.0)
MCH: 26.5 pg (ref 26.0–34.0)
MCHC: 31.9 g/dL (ref 30.0–36.0)
MCV: 82.9 fL (ref 80.0–100.0)
Monocytes Absolute: 0.5 10*3/uL (ref 0.1–1.0)
Monocytes Relative: 11 %
Neutro Abs: 2.2 10*3/uL (ref 1.7–7.7)
Neutrophils Relative %: 47 %
Platelet Count: 222 10*3/uL (ref 150–400)
RBC: 4.57 MIL/uL (ref 4.22–5.81)
RDW: 17.2 % — ABNORMAL HIGH (ref 11.5–15.5)
WBC Count: 4.7 10*3/uL (ref 4.0–10.5)
nRBC: 0 % (ref 0.0–0.2)

## 2023-03-27 LAB — IRON AND IRON BINDING CAPACITY (CC-WL,HP ONLY)
Iron: 38 ug/dL — ABNORMAL LOW (ref 45–182)
Saturation Ratios: 8 % — ABNORMAL LOW (ref 17.9–39.5)
TIBC: 454 ug/dL — ABNORMAL HIGH (ref 250–450)
UIBC: 416 ug/dL — ABNORMAL HIGH (ref 117–376)

## 2023-03-27 LAB — FERRITIN: Ferritin: 6 ng/mL — ABNORMAL LOW (ref 24–336)

## 2023-03-27 NOTE — Telephone Encounter (Signed)
 Scheduled appointments per 3/7 LOS notes.

## 2023-03-27 NOTE — Progress Notes (Signed)
 [facility] [phone]   [phone]  PROGRESS NOTE  Patient Care Team: Lewis Moccasin, MD as PCP - General (Family Medicine)  Hematological/Oncological History # CD30-Positive T-cell Lymphoma, Stage III/IV 07/09/2020: patient underwent cholecystectomy. Liver wedge biopsy during the procedure revealed involvement of a CD30-positive T-cell lymphoproliferative disorder 07/25/2020: establish care with Dr. Leonides Schanz  08/08/2020: PET CT scan showed innumerable hypermetabolic lesions (primarily Deauville 4) in the liver and skeleton. Left neck adenopathy including a dominant level IB lymph node which is Deauville 5. 08/15/2020: Cycle 1 Day 1 of BV-CHP  09/04/2020: Cycle 2 Day 1 of BV-CHP 09/27/2020:  Cycle 3 Day 1 of BV-CHP 10/18/2020: Cycle 4 Day 1 of BV-CHP 11/08/2020: Cycle 5 Day 1 of BV-CHP 11/29/2020: Cycle 6 Day 1 of BV-CHP 12/26/2020: PET CT scan showed further improvement compared to the 11/05/2020 exam, with mild reduction in activity in the extensive scattered mixed density skeletal lesions (although still Deauville 4) and no substantial hypermetabolic adenopathy currently identified 02/11/2021: Bone marrow biopsy performed due to concern for FDG avidity of bone marrow.  No evidence of residual disease noted on bone marrow biopsy.  Interval History:  Maurice MCCREEDY Sr. 73 y.o. male with medical history significant for T cell lymphoma (Stage III/IV) presents for a follow up visit. The patient's last visit was on 01/23/2023.  In the interim since that time he has had no major changes in his health.  On exam today Maurice Fox reports he does have some fatigue and which results in low motivation to do his ADLs at times. He has a good appetite and denies any significant weight changes. He denies nausea, vomiting or bowel habit changes. He denies any dark stool at this time. He denies any overt signs of bleeding including hematochezia, hematuria or epistaxis. He  reports having some dizziness when he bends over without any syncopal episodes. He denies fevers, chills, sweats, shortness of breath, chest pain or cough. He has no other complaints. Rest of the 10 point ROS is below.   MEDICAL HISTORY:  Past Medical History:  Diagnosis Date   Arthritis    Cancer (HCC)    Hyperlipidemia    Hypertension    followed by pcp  (11-23-2019  per pt had stress test greater than 20 yrs ago, told ok)   Nocturia    OSA on CPAP    per pt uses nightly   Phimosis    Sleep apnea    Type 2 diabetes mellitus (HCC)    followed by pcp   (11-23-2019 per pt does not check blood sugar at home)   Wears glasses     SURGICAL HISTORY: Past Surgical History:  Procedure Laterality Date   APPENDECTOMY  child   CHOLECYSTECTOMY N/A 07/09/2020   Procedure: LAPAROSCOPIC CHOLECYSTECTOMY WITH INTRAOPERATIVE CHOLANGIOGRAM,;  Surgeon: [name], MD;  Location: [name];  Service: General;  Laterality: N/A;   CIRCUMCISION N/A 11/29/2019   Procedure: CIRCUMCISION ADULT;  Surgeon: [name], MD;  Location: [facility];  Service: Urology;  Laterality: N/A;   IR IMAGING GUIDED PORT INSERTION  08/06/2020   IR REMOVAL TUN ACCESS W/ PORT W/O FL MOD SED  04/09/2021   LIVER BIOPSY N/A 07/09/2020   Procedure: LAP LIVER BIOPSY;  Surgeon: [name], MD;  Location: [facility];  Service: General;  Laterality: N/A;   ROTATOR CUFF REPAIR Left chld   SUPRA-UMBILICAL HERNIA N/A 07/09/2020   Procedure: PRIMARY REPAIR SUPRA-UMBILICAL HERNIA;  Surgeon: [name], MD;  Location: [facility];  Service: General;  Laterality: N/A;    SOCIAL HISTORY: Social History   Socioeconomic History   Marital status: Divorced    Spouse name: Not on file   Number of children: Not on file   Years of education: Not on file   Highest education level: Not on file  Occupational History   Not on file  Tobacco Use   Smoking status: Every Day    Current packs/day: 1.00    Average packs/day: 1  pack/day for 49.0 years (49.0 ttl pk-yrs)    Types: Cigars, Cigarettes   Smokeless tobacco: Never  Vaping Use   Vaping status: Never Used  Substance and Sexual Activity   Alcohol use: Not Currently   Drug use: Never   Sexual activity: Not on file  Other Topics Concern   Not on file  Social History Narrative   Not on file   Social Drivers of Health   Financial Resource Strain: Not on file  Food Insecurity: Not on file  Transportation Needs: Not on file  Physical Activity: Not on file  Stress: Not on file  Social Connections: Unknown (06/03/2021)   Received from [facility]   Social Network    Social Network: Not on file  Intimate Partner Violence: Unknown (04/25/2021)   Received from [facility]   HITS    Physically Hurt: Not on file    Insult or Talk Down To: Not on file    Threaten Physical Harm: Not on file    Scream or Curse: Not on file    FAMILY HISTORY: Family History  Problem Relation Age of Onset   Hypotension Mother    Diabetes Mellitus II Father    Colon cancer Sister    Prostate cancer Brother    Prostate cancer Brother     ALLERGIES:  has no known allergies.  MEDICATIONS:  Current Outpatient Medications  Medication Sig Dispense Refill   amLODipine (NORVASC) 2.5 MG tablet Take 2.5 mg by mouth daily.     aspirin 81 MG EC tablet 1 tablet     azelastine (ASTELIN) 0.1 % nasal spray      buPROPion (WELLBUTRIN XL) 150 MG 24 hr tablet Take 150 mg by mouth every morning.     Continuous Glucose Sensor (FREESTYLE LIBRE 3 SENSOR) MISC USE AS DIRECTED. CHANGE EVERY 14 DAYS     diclofenac Sodium (VOLTAREN) 1 % GEL Apply 2 g topically 4 (four) times daily as needed for pain. (Patient not taking: Reported on 03/17/2023)     fluticasone (FLONASE) 50 MCG/ACT nasal spray Place 2 sprays into both nostrils in the morning and at bedtime.     LANTUS SOLOSTAR 100 UNIT/ML Solostar Pen Inject 10 Units into the skin daily.     metFORMIN  (GLUCOPHAGE) 1000 MG tablet Take 1,000 mg by mouth 2 (two) times daily.     Multiple Vitamins-Minerals (CENTRUM SILVER PO) Take 1 tablet by mouth at bedtime.     olopatadine (PATANOL) 0.1 % ophthalmic solution Apply to eye.     pantoprazole (PROTONIX) 40 MG tablet TAKE 1 TABLET(40 MG) BY MOUTH TWICE DAILY 180 tablet 0   rosuvastatin (CRESTOR) 10 MG tablet Take 10 mg by mouth at bedtime.     RYBELSUS 7 MG TABS      sildenafil (VIAGRA) 100 MG tablet Take 100 mg by mouth daily as needed (ED).     SYNJARDY 12.5-500 MG TABS Take 1 tablet by mouth daily.  tadalafil (CIALIS) 5 MG tablet Take 1 tablet (5 mg total) by mouth daily as needed for erectile dysfunction. 10 tablet 0   tamsulosin (FLOMAX) 0.4 MG CAPS capsule Take 0.4 mg by mouth daily.     valsartan-hydrochlorothiazide (DIOVAN-HCT) 320-25 MG tablet Take 1 tablet by mouth daily.     No current facility-administered medications for this visit.    REVIEW OF SYSTEMS:   Constitutional: ( - ) fevers, ( - )  chills , ( - ) night sweats Eyes: ( - ) blurriness of vision, ( - ) double vision, ( - ) watery eyes Ears, nose, mouth, throat, and face: ( - ) mucositis, ( - ) sore throat Respiratory: ( - ) cough, ( - ) dyspnea, ( - ) wheezes Cardiovascular: ( - ) palpitation, ( - ) chest discomfort, ( - ) lower extremity swelling Gastrointestinal:  ( - ) nausea, ( - ) heartburn, ( - ) change in bowel habits Skin: ( - ) abnormal skin rashes Lymphatics: ( - ) new lymphadenopathy, ( - ) easy bruising Neurological: ( - ) numbness, ( - ) tingling, ( - ) new weaknesses Behavioral/Psych: ( - ) mood change, ( - ) new changes  All other systems were reviewed with the patient and are negative.  PHYSICAL EXAMINATION: ECOG PERFORMANCE STATUS: 1 - Symptomatic but completely ambulatory  Vitals:   03/27/23 1019  BP: 137/69  Pulse: 81  Resp: 16  Temp: 97.6 F (36.4 C)  SpO2: 100%    Filed Weights   03/27/23 1019  Weight: 250 lb 3.2 oz (113.5 kg)      GENERAL: Well-appearing elderly African-American male, alert, no distress and comfortable SKIN: skin color, texture, turgor are normal, no rashes or significant lesions EYES: conjunctiva are pink and non-injected, sclera clear NECK: supple, non-tender LUNGS: clear to auscultation and percussion with normal breathing effort HEART: regular rate & rhythm and no murmurs and no lower extremity edema PSYCH: alert & oriented x 3, fluent speech NEURO: no focal motor/sensory deficits  LABORATORY DATA:  I have reviewed the data as listed    Latest Ref Rng & Units 03/27/2023    9:29 AM 02/09/2023    9:42 AM 01/23/2023   10:33 AM  CBC  WBC 4.0 - 10.5 K/uL 4.7  5.3  5.8   Hemoglobin 13.0 - 17.0 g/dL 16.1  09.6  04.5   Hematocrit 39.0 - 52.0 % 37.9  40.2  39.0   Platelets 150 - 400 K/uL 222  201.0  226        Latest Ref Rng & Units 01/23/2023   10:33 AM 01/07/2023   12:02 PM 10/17/2022   10:01 AM  CMP  Glucose 70 - 99 mg/dL 409   811   BUN 8 - 23 mg/dL 18   29   Creatinine 9.14 - 1.24 mg/dL 7.82  9.56  2.13   Sodium 135 - 145 mmol/L 133   138   Potassium 3.5 - 5.1 mmol/L 3.9   4.2   Chloride 98 - 111 mmol/L 102   108   CO2 22 - 32 mmol/L 25   24   Calcium 8.9 - 10.3 mg/dL 9.0   9.0   Total Protein 6.5 - 8.1 g/dL 7.0   7.0   Total Bilirubin 0.0 - 1.2 mg/dL 0.5   0.5   Alkaline Phos 38 - 126 U/L 94   83   AST 15 - 41 U/L 18   14   ALT 0 -  44 U/L 16   13    RADIOGRAPHIC STUDIES: No images were reviewed during this visit.   ASSESSMENT & PLAN Maurice Fox is a 73 y.o. male with medical history significant for T cell lymphoma (Stage III/IV) presents for a follow up visit.  After review of the labs, review of the records, and discussion with the patient the patients findings are most consistent with a peripheral CD30 positive T-cell lymphoma.  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  The patient has been staged with a PET CT scan which showed  clear involvement of the skeleton and liver, most consistent with stage IV disease.  Given these findings we will plan for 6 cycles of BV CHP chemotherapy.  The regimen of BV CHP consists of brentuximab 1.8 mg/kg IV on day 1, cyclophosphamide 750 mg per metered squared on day 1, doxorubicin 50 mg per metered squared on day 1, and prednisone 60 mg p.o. day 1 through 5.  This is to be continued every 21 days with a plan for up to 6 cycles.  After the third or fourth cycle we will consider restaging PET CT scan in order to evaluate for response.  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 --Received 6 cycles of BV-CHP from 08/15/2020-11/29/2020.  --PET CT scan performed on 11/05/2020, findings show Deauville 2 disease with some Deauville 4 noted in the skeleton. Post treatment PET after the last cycle showed diminished FDG avidity, but still Deauville 4.  -- Bone marrow biopsy on 02/11/2021 shows no evidence of residual disease. -- Per NCCN recommendations we will have the patient return to clinic every 3 to 6 months with imaging on a 51-month basis for the first 2 years PLAN: --last CT scan in Dec 2024 showed no evidence of residual/recurrent disease. Next due Dec 2025.  --Return to clinic in  6 months (around July 2025) with imaging every 12 months for 3 years.  #Dark Tarry Stools # Hemoglobin Drop  --Underwent EGD and colonoscopy on 03/17/2023. Colonoscopy found several polyps in the ascending colon, transverse colon, descending colon and rectum. In addition there were non-bleeding internal hemorrhoids. EGD showed esophgeal mucosal variant, small hiatal hernia and gastritis with hemorrhage, dudenitis.  --Labs today show anemia with Hgb 12.1, MCV 82.9, Iron panel shows deficiency with serum iron 38, TIBC 454, saturation  8%, ferritin 6.  --Since patient is on PPI, PO iron is likely poorly absorbed. Recommend IV iron to bolster iron levels.  --Plan to return in 8 weeks for lab check   #Erectile Dysfunction -- Patient notes Viagra is not working for him.  We provided prescription for Cialis.  If this remains ineffective would make referral to urology.  #Supportive Care -- port removed 04/09/2021  No orders of the defined types were placed in this encounter.  All questions were answered. The patient knows to call the clinic with any problems, questions or concerns.  A total of more than 30 minutes were spent on this encounter with face-to-face time and non-face-to-face time, including preparing to see the patient, ordering medications, counseling the patient and coordination of care as outlined above.   Georga Kaufmann PA-C Dept of Hematology and Oncology [facility] Phone: [phone]   03/30/2023 9:12 AM

## 2023-03-30 ENCOUNTER — Telehealth: Payer: Self-pay | Admitting: *Deleted

## 2023-03-30 ENCOUNTER — Encounter: Payer: Self-pay | Admitting: Hematology and Oncology

## 2023-03-30 ENCOUNTER — Encounter: Payer: Self-pay | Admitting: Physician Assistant

## 2023-03-30 DIAGNOSIS — D509 Iron deficiency anemia, unspecified: Secondary | ICD-10-CM | POA: Insufficient documentation

## 2023-03-30 NOTE — Telephone Encounter (Signed)
-----   Message from Briant Cedar sent at 03/30/2023  9:23 AM EDT ----- Please notify patient that his iron levels are low and we will arrange for IV iron at [address]. ----- Message ----- From: Leory Plowman, [facility] [place] Sent: 03/27/2023   9:37 AM EDT To: Briant Cedar, PA-C

## 2023-03-30 NOTE — Telephone Encounter (Signed)
 TCT patient regarding recent lab results. No answer. No vm available.

## 2023-03-31 ENCOUNTER — Telehealth: Payer: Self-pay | Admitting: *Deleted

## 2023-03-31 NOTE — Telephone Encounter (Signed)
 Patient lvm that someone tried to call him yesterday from this office.  Returned TCT to patient with results per message below:  ----- Message from Briant Cedar sent at 03/30/2023  9:23 AM EDT ----- Please notify patient that his iron levels are low and we will arrange for IV iron at [address].  Patient in agreement with IV iron. Advised him that [facility] will contact him for appt.

## 2023-04-01 NOTE — Telephone Encounter (Signed)
 F/U:  Preferred medication is Venofer.  Feraheme is non preferred and will be denied if patient has not failed Venofer.  Would you like to try Venofer? Auth Submission: PENDING Site of care: Site of care: [facility] Payer: [employer] Medication & CPT/J Code(s) submitted: Venofer (Iron Sucrose) J1756 Route of submission (phone, fax, portal):  Phone # Fax # Auth type: Buy/Bill PB Units/visits requested:  Reference number:  Approval from: 04/01/23 to 09/01/23

## 2023-04-01 NOTE — Telephone Encounter (Signed)
 Maurice Fox, Yes, we did receive the orders.  I submitted the auth for Feraheme. Awaiting a response from the insurance.  I will call and get an update and f/u once I have a response.

## 2023-04-02 ENCOUNTER — Other Ambulatory Visit: Payer: Self-pay | Admitting: Physician Assistant

## 2023-04-03 DIAGNOSIS — I700 Atherosclerosis of aorta: Secondary | ICD-10-CM | POA: Diagnosis not present

## 2023-04-03 DIAGNOSIS — Z136 Encounter for screening for cardiovascular disorders: Secondary | ICD-10-CM | POA: Diagnosis not present

## 2023-04-10 ENCOUNTER — Ambulatory Visit: Payer: Medicare (Managed Care)

## 2023-04-10 VITALS — BP 148/79 | HR 94 | Temp 98.1°F | Resp 18 | Ht 70.0 in | Wt 251.6 lb

## 2023-04-10 DIAGNOSIS — D509 Iron deficiency anemia, unspecified: Secondary | ICD-10-CM

## 2023-04-10 MED ORDER — IRON SUCROSE 20 MG/ML IV SOLN
200.0000 mg | Freq: Once | INTRAVENOUS | Status: AC
  Administered 2023-04-10: 200 mg via INTRAVENOUS
  Filled 2023-04-10: qty 10

## 2023-04-10 NOTE — Progress Notes (Signed)
 Diagnosis: Iron Deficiency Anemia  Provider:  Chilton Greathouse MD  Procedure: IV Push  IV Type: Peripheral, IV Location: R Hand  Venofer (Iron Sucrose), Dose: 200 mg  Post Infusion IV Care: Observation period completed and Peripheral IV Discontinued  Discharge: Condition: Good, Destination: Home . AVS Declined  Performed by:  Rico Ala, LPN

## 2023-04-16 DIAGNOSIS — N528 Other male erectile dysfunction: Secondary | ICD-10-CM | POA: Diagnosis not present

## 2023-04-16 DIAGNOSIS — R972 Elevated prostate specific antigen [PSA]: Secondary | ICD-10-CM | POA: Diagnosis not present

## 2023-04-17 ENCOUNTER — Other Ambulatory Visit: Payer: Self-pay | Admitting: Urology

## 2023-04-17 ENCOUNTER — Ambulatory Visit: Payer: Medicare (Managed Care) | Admitting: *Deleted

## 2023-04-17 VITALS — BP 130/83 | HR 80 | Temp 97.7°F | Resp 16 | Ht 70.0 in | Wt 252.4 lb

## 2023-04-17 DIAGNOSIS — D509 Iron deficiency anemia, unspecified: Secondary | ICD-10-CM

## 2023-04-17 DIAGNOSIS — R972 Elevated prostate specific antigen [PSA]: Secondary | ICD-10-CM

## 2023-04-17 MED ORDER — IRON SUCROSE 20 MG/ML IV SOLN
200.0000 mg | Freq: Once | INTRAVENOUS | Status: AC
  Administered 2023-04-17: 200 mg via INTRAVENOUS
  Filled 2023-04-17: qty 10

## 2023-04-17 NOTE — Progress Notes (Signed)
 Diagnosis: Iron Deficiency Anemia  Provider:  Chilton Greathouse MD  Procedure: IV Push  IV Type: Peripheral, IV Location: L Hand  Venofer (Iron Sucrose), Dose: 200 mg  Post Infusion IV Care: Observation period completed and Peripheral IV Discontinued  Discharge: Condition: Good, Destination: Home . AVS Provided  Performed by:  Forrest Moron, RN

## 2023-04-24 ENCOUNTER — Ambulatory Visit: Payer: Medicare (Managed Care)

## 2023-04-24 ENCOUNTER — Encounter: Payer: Self-pay | Admitting: Physician Assistant

## 2023-04-24 ENCOUNTER — Encounter: Payer: Self-pay | Admitting: Hematology and Oncology

## 2023-04-24 ENCOUNTER — Telehealth: Payer: Self-pay

## 2023-04-24 VITALS — BP 103/69 | HR 109 | Temp 97.9°F | Resp 22 | Ht 70.0 in | Wt 249.4 lb

## 2023-04-24 DIAGNOSIS — D509 Iron deficiency anemia, unspecified: Secondary | ICD-10-CM

## 2023-04-24 MED ORDER — IRON SUCROSE 20 MG/ML IV SOLN
200.0000 mg | Freq: Once | INTRAVENOUS | Status: AC
  Administered 2023-04-24: 200 mg via INTRAVENOUS
  Filled 2023-04-24: qty 10

## 2023-04-24 NOTE — Telephone Encounter (Signed)
 Auth Submission: NO AUTH NEEDED Site of care: Site of care: [facility] Payer: [employer] medicare and T[employer] for life Medication & CPT/J Code(s) submitted: Venofer (Iron Sucrose) J1756 Route of submission (phone, fax, portal):  Phone # Fax # Auth type: Buy/Bill PB Units/visits requested: 200mg  x 5 doses Reference number:  Approval from: 04/10/23 to 09/02/23

## 2023-04-24 NOTE — Progress Notes (Signed)
Diagnosis: Iron Deficiency Anemia  Provider:  Chilton Greathouse MD  Procedure: IV Push  IV Type: Peripheral, IV Location: L Hand  Venofer (Iron Sucrose), Dose: 200 mg  Post Infusion IV Care: Observation period completed and Peripheral IV Discontinued  Discharge: Condition: Good, Destination: Home . AVS Declined  Performed by:  Rico Ala, LPN

## 2023-05-01 ENCOUNTER — Ambulatory Visit (INDEPENDENT_AMBULATORY_CARE_PROVIDER_SITE_OTHER): Payer: Medicare (Managed Care)

## 2023-05-01 VITALS — BP 127/76 | HR 74 | Temp 97.9°F | Resp 16 | Ht 70.0 in | Wt 248.6 lb

## 2023-05-01 DIAGNOSIS — D509 Iron deficiency anemia, unspecified: Secondary | ICD-10-CM | POA: Diagnosis not present

## 2023-05-01 MED ORDER — IRON SUCROSE 20 MG/ML IV SOLN
200.0000 mg | Freq: Once | INTRAVENOUS | Status: AC
Start: 2023-05-01 — End: 2023-05-01
  Administered 2023-05-01: 200 mg via INTRAVENOUS
  Filled 2023-05-01: qty 10

## 2023-05-01 NOTE — Progress Notes (Signed)
 Diagnosis: Iron Deficiency Anemia  Provider:  Chilton Greathouse MD  Procedure: IV Push  IV Type: Peripheral, IV Location: L Hand  Venofer (Iron Sucrose), Dose: 200 mg  Post Infusion IV Care: Observation period completed and Peripheral IV Discontinued  Discharge: Condition: Good, Destination: Home . AVS Provided  Performed by:  Rico Ala, LPN

## 2023-05-06 ENCOUNTER — Encounter: Payer: Self-pay | Admitting: Physician Assistant

## 2023-05-06 ENCOUNTER — Encounter: Payer: Self-pay | Admitting: Hematology and Oncology

## 2023-05-11 ENCOUNTER — Ambulatory Visit: Payer: Medicare (Managed Care)

## 2023-05-11 VITALS — BP 143/79 | HR 71 | Temp 97.9°F | Resp 18 | Ht 70.0 in | Wt 251.6 lb

## 2023-05-11 DIAGNOSIS — D509 Iron deficiency anemia, unspecified: Secondary | ICD-10-CM

## 2023-05-11 MED ORDER — SODIUM CHLORIDE 0.9 % IV BOLUS
250.0000 mL | Freq: Once | INTRAVENOUS | Status: AC
  Administered 2023-05-11: 250 mL via INTRAVENOUS
  Filled 2023-05-11: qty 250

## 2023-05-11 MED ORDER — IRON SUCROSE 20 MG/ML IV SOLN
200.0000 mg | Freq: Once | INTRAVENOUS | Status: AC
  Administered 2023-05-11: 200 mg via INTRAVENOUS
  Filled 2023-05-11: qty 10

## 2023-05-11 NOTE — Progress Notes (Signed)
 " Cardiology Office Note:  .   Date:  05/13/2023 ID:  Maurice VEAR Schaffer Sr., DOB 29-Oct-1950, MRN [id number] PCP: [name], MD [facility] Providers Cardiologist:  None   Patient Profile: .      PMH Aortic atherosclerosis Coronary artery calcification noted on CT Hypertension Hyperlipidemia Diabetes OSA on CPAP Tobacco abuse 53 pack year history       History of Present Illness: .     History of Present Illness Maurice POUCHER Sr. is a very pleasant 73 y.o. male  who is here today for new patient consult for coronary calcification noted on abdominal CT. He reports he is largely sedentary, spending most of his day at home on the computer, watching TV, or sleeping. 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 He lives alone and does his own cooking, primarily frying fish or chicken tenders. He admits to a limited intake of fruits and vegetables and often snacks on chips and salsa. He has a history of smoking, currently about a pack a day, since the age of 57. He denies chest pain but does experience shortness of breath at times when walking. He uses a CPAP machine for sleep and reports no issues with breathing at night. He is on insulin  for diabetes and reports BP is generally well controlled. Had lab work with PCP the day prior.  Family history: His family history includes Colon cancer in his sister; Diabetes Mellitus II in his father; Hypotension in his mother; Prostate cancer in his brother and brother.   ASCVD Risk Score: ASCVD (Atherosclerotic Cardiovascular Disease) Risk Algorithm including Known ASCVD from AHA/ACC from [email or web address]  on 05/11/2023 ** All calculations should be rechecked by clinician prior to use **  RESULT SUMMARY: 50.9 % Risk of cardiovascular event (coronary or stroke death or non-fatal MI or stroke) in next 10 years.  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 - walks around but at [employer] Sedentary lifestyle - computer, tv, visiting with family  No results found for: LIPOA   ROS: See HPI       Studies Reviewed: SABRA   EKG Interpretation Date/Time:  Wednesday May 13 2023 09:37:03 EDT Ventricular Rate:  90 PR Interval:  218 QRS Duration:  136 QT Interval:  394 QTC Calculation: 481 R Axis:   -78  Text Interpretation: Sinus rhythm with 1st degree A-V block Left axis deviation Right bundle branch block Inferior infarct , age undetermined When compared with ECG of 02-Aug-2020 02:51, No significant change since last tracing Confirmed by Percy Browning [phone]) on 05/13/2023 9:46:24 AM      Risk Assessment/Calculations:             Physical Exam:   VS: BP 120/66   Pulse 90   Ht 5' 10 (1.778 m)   Wt 253 lb 3.2 oz (114.9 kg)   SpO2 99%   BMI 36.33 kg/m  Wt Readings from Last 3 Encounters:  05/13/23 253 lb 3.2 oz (114.9 kg)  05/11/23 251 lb 9.6 oz (114.1 kg)  05/01/23 248 lb 9.6 oz (112.8 kg)     GEN: obese, well developed in no acute distress NECK: No JVD; No carotid bruits CARDIAC: RRR, no murmurs, rubs, gallops RESPIRATORY:  Clear to auscultation without rales, wheezing or rhonchi  ABDOMEN: Soft, non-tender, non-distended EXTREMITIES:  No edema; No deformity     ASSESSMENT AND PLAN: .    Assessment & Plan Coronary artery disease  Coronary calcification was noted on an abdominal CT. 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  ASCVD risk score is 50%.  Risk factors include age, race, diabetes, smoking, and hypertension. He denies chest pain. He notes DOE at times when he is walking. We will get coronary CTA for ischemia evaluation. Will have him take Lopressor  50 mg prior to CT.  Encouraged significant lifestyle modification to focus on eating a heart healthy mostly plant based diet avoiding saturated fat, processed foods, simple carbohydrates, and sugar along with aiming for at least 150 minutes of moderate intensity exercise each week. Consider getting a gym membership.  No bleeding problems.  Continue aspirin, Diovan  HCT, rosuvastatin , amlodipine .  Abnormal EKG    EKG  today reveals NSR at 90 bpm, RBBB, inferior infarct age undetermined.  He denies history of significant chest pain, dyspnea, or other symptoms concerning for MI.  We are getting coronary CTA for evaluation of ischemia.  ER precautions advised.  Nicotine dependence   He has been smoking approximately one pack per day since age 18. Smoking cessation is critical to reduce cardiovascular risk. Complete cessation advised.   Hypertension BP is well controlled.  Renal function stable on labs completed 05/12/2023.  No changes in antihypertensive therapy today.  Hyperlipidemia LDL goal < 70 Lipid profile completed 05/12/2023 revealed total cholesterol 138, HDL 49, triglycerides 58, LDL-C 76. Has been on rosuvastatin  10 mg daily for some time, advised that he increase rosuvastatin  to 20 mg daily. Plan to get NMR, ALT and LP(a) in 2-3 months.   Type 2 diabetes mellitus   A1c 9.0% on 05/12/23. Dietary habits may contribute to poor glycemic control. Encouraged dietary changes to include more fruits, vegetables, and whole grains. Recommend healthy cooking classes to improve dietary habits. Management per PCP.    Plan/Goals: 1: Aim to get 150 minutes of moderate intensity exercise each week along with at least 2 days per week of strength training  or resistance training. 2: Limit saturated fat, simple carbohydrates and sugar in your diet. 3: Review information on more nutritional food options.         Disposition: 2-3 months with me  Signed, Rosaline Bane, NP-C "

## 2023-05-11 NOTE — Progress Notes (Signed)
 Diagnosis: Iron  Deficiency Anemia  Provider:  Praveen Mannam MD  Procedure: IV Push  IV Type: Peripheral, IV Location: L Hand  Venofer  (Iron  Sucrose), Dose: 200 mg  Post Infusion IV Care: Observation period completed  Discharge: Condition: Good, Destination: Home . AVS Provided  Performed by:  Star [place], LPN

## 2023-05-12 DIAGNOSIS — D649 Anemia, unspecified: Secondary | ICD-10-CM | POA: Diagnosis not present

## 2023-05-12 DIAGNOSIS — R5383 Other fatigue: Secondary | ICD-10-CM | POA: Diagnosis not present

## 2023-05-12 DIAGNOSIS — E785 Hyperlipidemia, unspecified: Secondary | ICD-10-CM | POA: Diagnosis not present

## 2023-05-12 DIAGNOSIS — R739 Hyperglycemia, unspecified: Secondary | ICD-10-CM | POA: Diagnosis not present

## 2023-05-13 ENCOUNTER — Telehealth (HOSPITAL_BASED_OUTPATIENT_CLINIC_OR_DEPARTMENT_OTHER): Payer: Self-pay | Admitting: *Deleted

## 2023-05-13 ENCOUNTER — Encounter (HOSPITAL_BASED_OUTPATIENT_CLINIC_OR_DEPARTMENT_OTHER): Payer: Self-pay | Admitting: Nurse Practitioner

## 2023-05-13 ENCOUNTER — Ambulatory Visit (HOSPITAL_BASED_OUTPATIENT_CLINIC_OR_DEPARTMENT_OTHER): Payer: Medicare (Managed Care) | Admitting: Nurse Practitioner

## 2023-05-13 VITALS — BP 120/66 | HR 90 | Ht 70.0 in | Wt 253.2 lb

## 2023-05-13 DIAGNOSIS — F17210 Nicotine dependence, cigarettes, uncomplicated: Secondary | ICD-10-CM

## 2023-05-13 DIAGNOSIS — I10 Essential (primary) hypertension: Secondary | ICD-10-CM

## 2023-05-13 DIAGNOSIS — E1165 Type 2 diabetes mellitus with hyperglycemia: Secondary | ICD-10-CM | POA: Diagnosis not present

## 2023-05-13 DIAGNOSIS — R931 Abnormal findings on diagnostic imaging of heart and coronary circulation: Secondary | ICD-10-CM | POA: Diagnosis not present

## 2023-05-13 DIAGNOSIS — I25118 Atherosclerotic heart disease of native coronary artery with other forms of angina pectoris: Secondary | ICD-10-CM

## 2023-05-13 DIAGNOSIS — R9431 Abnormal electrocardiogram [ECG] [EKG]: Secondary | ICD-10-CM

## 2023-05-13 DIAGNOSIS — R0609 Other forms of dyspnea: Secondary | ICD-10-CM

## 2023-05-13 DIAGNOSIS — Z794 Long term (current) use of insulin: Secondary | ICD-10-CM

## 2023-05-13 LAB — LAB REPORT - SCANNED
A1c: 9
EGFR (African American): 68

## 2023-05-13 MED ORDER — METOPROLOL TARTRATE 50 MG PO TABS: ORAL_TABLET | ORAL | 0 refills | Status: DC

## 2023-05-13 NOTE — Patient Instructions (Signed)
 Medication Instructions:   Your physician recommends that you continue on your current medications as directed. Please refer to the Current Medication list given to you today.   *If you need a refill on your cardiac medications before your next appointment, please call your pharmacy*  Lab Work:  None ordered.  If you have labs (blood work) drawn today and your tests are completely normal, you will receive your results only by: MyChart Message (if you have MyChart) OR A paper copy in the mail If you have any lab test that is abnormal or we need to change your treatment, we will call you to review the results.  Testing/Procedures:    Your cardiac CT will be scheduled at one of the below locations:   [facility] [address] [place], [place] [address] [phone]    If scheduled at [facility], please arrive at the [facility] Entrance (Entrance C2) of [facility] 30 minutes prior to test start time. You can use the FREE valet parking offered at entrance C (encouraged to control the heart rate for the test)  Proceed to the [facility] Radiology Department (first floor) to check-in and test prep.   All radiology patients and guests should use entrance C2 at [facility], accessed from [facility], even though the hospital's physical address listed is [address]    Please follow these instructions carefully (unless otherwise directed):  An IV will be required for this test and Nitroglycerin will be given.  Hold all erectile dysfunction medications at least 3 days (72 hrs) prior to test. (Ie viagra, cialis , sildenafil, tadalafil , etc)   On the Night Before the Test: Be sure to Drink plenty of water. Do not consume any caffeinated/decaffeinated beverages or chocolate 12 hours prior to your test.   On the Day of the Test: Drink plenty of water until 1 hour prior to the test. Do not eat any food 1 hour  prior to test. You may take your regular medications prior to the test.  Take metoprolol  (Lopressor ) one (1) tablet by mouth ( 50 mg)  two hours prior to test. If you take Hydrochlorothiazide please HOLD on the morning of the test.      After the Test: Drink plenty of water. After receiving IV contrast, you may experience a mild flushed feeling. This is normal. On occasion, you may experience a mild rash up to 24 hours after the test. This is not dangerous. If this occurs, you can take Benadryl  25 mg, Zyrtec, Claritin, or Allegra and increase your fluid intake. (Patients taking Tikosyn should avoid Benadryl , and may take Zyrtec, Claritin, or Allegra) If you experience trouble breathing, this can be serious. If it is severe call 911 IMMEDIATELY. If it is mild, please call our office.  We will call to schedule your test 2-4 weeks out understanding that some insurance companies will need an authorization prior to the service being performed.   For more information and frequently asked questions, please visit our website : [email or web address]  For non-scheduling related questions, please contact the cardiac imaging nurse navigator should you have any questions/concerns: Cardiac Imaging Nurse Navigators Direct Office Dial: [phone]   For scheduling needs, including cancellations and rescheduling, please call [place], [phone].   Follow-Up: At [facility], you and your health needs are our priority.  As part of our continuing mission to provide you with exceptional heart care, our providers are all part of one team.  This team includes your primary Cardiologist (physician) and Advanced Practice Providers or APPs (Physician Assistants and Nurse Practitioners) who all work together to provide you with the care you need, when you need it.  Your next appointment:   3 month(s)  Provider:   Slater Duncan, NP    We recommend signing up for the patient portal  called "MyChart".  Sign up information is provided on this After Visit Summary.  MyChart is used to connect with patients for Virtual Visits (Telemedicine).  Patients are able to view lab/test results, encounter notes, upcoming appointments, etc.  Non-urgent messages can be sent to your provider as well.   To learn more about what you can do with MyChart, go to [email or web address].   Other Instructions  Adopting a Healthy Lifestyle.   Weight: Know what a healthy weight is for you (roughly BMI <25) and aim to maintain this. You can calculate your body mass index on your smart phone. Unfortunately, this is not the most accurate measure of healthy weight, but it is the simplest measurement to use. A more accurate measurement involves body scanning which measures lean muscle, fat tissue and bony density. We do not have this equipment at [facility].    Diet: Aim for 7+ servings of fruits and vegetables daily Limit animal fats in diet for cholesterol and heart health - choose grass fed whenever available Avoid highly processed foods (fast food burgers, tacos, fried chicken, pizza, hot dogs, french fries)  Saturated fat comes in the form of butter, lard, coconut oil, margarine, partially hydrogenated oils, and fat in meat. These increase your risk of cardiovascular disease.  Use healthy plant oils, such as olive, canola, soy, corn, sunflower and peanut.  Whole foods such as fruits, vegetables and whole grains have fiber  Men need > 38 grams of fiber per day Women need > 25 grams of fiber per day  Load up on vegetables and fruits - one-half of your plate: Aim for color and variety, and remember that potatoes dont count. Go for whole grains - one-quarter of your plate: Whole wheat, barley, wheat berries, quinoa, oats, brown rice, and foods made with them. If you want pasta, go with whole wheat pasta. Protein power - one-quarter of your plate: Fish, chicken, beans, and nuts are all healthy, versatile  protein sources. Limit red meat. You need carbohydrates for energy! The type of carbohydrate is more important than the amount. Choose carbohydrates such as vegetables, fruits, whole grains, beans, and nuts in the place of white rice, white pasta, potatoes (baked or fried), macaroni and cheese, cakes, cookies, and donuts.  If youre thirsty, drink water. Coffee and tea are good in moderation, but skip sugary drinks and limit milk and dairy products to one or two daily servings. Keep sugar intake at 6 teaspoons or 24 grams or LESS       Exercise: Aim for 150 min of moderate intensity exercise weekly for heart health, and weights twice weekly for bone health Stay active - any steps are better than no steps! Aim for 7-9 hours of sleep daily      Tackling Obesity with Lifestyle Changes  Obesity- What is it? And What can we do about it?  Obesity is a chronic complex disease defined as excessive fat deposits that can have a negative effect on our health. It can lead to many other diseases including type 2 diabetes.  Weight gain occurs when the amount of energy (calories) we consume is greater than the amount we  use.  When our energy output is greater than our energy input we lose weight. The basic concept is simple, but in reality, it's much more complicated.  Unfortunately, in some people, our bodies have many ways it can compensate when we try to eat less and move more which can prevent us  from changing our weight. This can lead to some people having a much more difficult time losing weight even when they put healthy habits into practice. This can be frustrating. We want to focus on healthy habits, physical activity and how we feel, and less the number on the scale.  Food As Energy  Calories  Calories is just a unit of measurement for energy.  Counting calories is not required to lose weight but counting for a short period of time can:   help you learn good portion sizes   Learn what  your true energy needs are.   Help you be more aware of your snacking or grazing habits  To help calculate how many calories you should be eating, the NIH has a great body weight planner calculator at BeverageBuggy.si  Types of Energy Expenditure  Basal Metabolic Rate (BMR) Energy that our bodies use to preform everyday tasks. More muscle mass through resistance training can increase this a small amount  Thermic Effect of Food The amount of energy that it takes to breakdown the food we eat. This will be highest when we eat protein and fiber rich foods  Exercise Energy Expenditure The amount of energy used during formal exercise (walking, biking, weightlifting)  Non-exercise activity thermogenesis (NEAT) The amount of energy spent on activities that are not formal exercise (standing, fidgeting). Therefore, it is not only important to do formal exercise but also move around throughout the day.  Managing The Meal  Macro nutrients (carbohydrates, fats and protein, fiber, water)  Micronutrients (vitamins, minerals)  Dietary Fiber  Benefits Examples Cautions  Soluble fiber  Decreases cholesterol  improve blood sugar control,  Feeds our gut bacteria  Allows us  to feel fuller for longer so we eat less  fruits  oats  barley  legumes  peas  Beans  vegetables (broccoli) and root vegetables (carrots) Add fiber into your diet slowly and be sure to drink at least 8 cups of water a day. This will help limit gas, bloating, diarrhea, or constipation.  Insoluble Fiber  Improves digestive health by making stool easier to pass  Allows us  to feel fuller for longer so we eat less  whole grains  nuts  seeds  skin of fruit  vegetables (green beans, zucchini, cauliflower)  Tricks to add more fiber to your diet   Add beans (pinto, kidney, lima, navy and garbanzo) to salads, ground meat or brown rice   Add nuts or seeds and or fresh/frozen fruit to yogurt, cottage cheese, salads or  steel cut oats   Cut up vegetables and eat with hummus   Look for unsweetened whole grain cereals with at least 5g of fiber per serving   Switch to whole grain bread. Look for bread that has whole grain flour as the first ingredient and has more fiber than carbs if you were to multiple the fiber x 10.   Try bulgar, barely, quinoa, buckwheat, brown rice wild rice instead of white rice   Keep frozen vegetables on hand to add to dishes or soups  Meal Planning:  Meal planning is the key to setting you up for success. Here are some examples of healthy meal options.  Breakfast  Option 1: Omelette with vegetables (1 egg, spinach, mushrooms, or other vegetable of your choice), 2 slices whole-grain toast, tip of thumb size butter or soft margarine,  cup low-fat milk or yogurt  Option 2: steel-cut rolled oats (? cup dry), 1 tbsp peanut butter added to cooked oats,  cup low-fat milk.  Option 3: 2 slices whole-grain or rye toast with avocado spread ( small avocado mased with herbs and pepper to taste), 1 poached egg or [place] up (cooked to your liking)  Option 4:  cup plain 0% [place] yogurt topped with  cup berries and  cup walnuts or almonds, 2 slices whole-grain or rye toast, tip of thumb size soft margarine/butter  Lunch:  Option 1: 2 cups red lentil soup, green salad with 1 tbsp homemade vinaigrette (extra virgin olive oil and vinegar of choice plus spices)  Option 2: 3 oz. roasted chicken, 2 slices whole-grain bread, 2 tsp mayonnaise, mustard, lettuce, tomato if desired, 1 fruit (example: medium-sized apple or small pear)  Option 3: 3 oz. tuna packed in water, 1 whole-wheat pita (6 inch), 2 tsp mayonnaise, lettuce, tomato, or other non-starchy vegetable of your choice, 1 fruit (example: medium-sized apple or small pear)  Option 4: 1 serving of garden veggie buddha bowl with lentils and tahini sauce and 1 cup berries topped with  cup plain 0% Greek yogurt  Dinner:  Option  1: 1 serving roasted cauliflower salad, 3-4 oz. grilled or baked pork loin chop, 1/2 cup mashed potato, or brown rice or quinoa  Option 2: 1 serving fish (baked, grilled or air fried), green salad, 1 tbsp homemade vinaigrette,  cup cooked couscous  Option 3: 1 cup cooked whole grained pasta (example: spaghetti, spirals, macaroni),  cup favorite pasta sauce (preferably homemade), 3-4 oz. grilled or baked chicken, green salad, 1 tbsp homemade vinaigrette  Option 4: 1 serving oven roasted salmon,  cup mashed sweet potato or couscous or brown rice or quinoa, broccoli (steamed or roasted)  Healthy snacks:   Carrots or celery with 1 tbsp of hummus   1 medium-sized fruit (apple or orange)   1 cup plain 0% [place] yogurt with  cup berries   Half apple, sliced, with 1 tbsp (15 mL) peanut or almond butter  Dining out:  Eating away from home has become a part of many people's lifestyle. Making healthy choices when you are eating out is important too. Portion size is an important part of healthy choices. Most branded fast-food places provide calories, sodium, and fat content for their menu items. www.calorieking.com would be great resource to find nutrition facts for your favorite brands and fast-food restaurants. Company specific website can be Chief Technology Officer for nutrition information for their items. (e.g. www.mcdonalds.com or www.nutritionix.com/[place]/menu/premium)  Here are some tips to help you make wise food choices when you are dining out.  Chose more often Avoid  Beverages   Choose more often: Water, low fat milk  Sugar-free/diet drinks  Unsweet tea or coffee    Avoid: Milkshakes, fruit drinks, regular pop  Alcohol , specialty drinks (e.g. iced cappuccino)  Fast food  Choose more often:  Garden salad  Mini subs, pita sandwiches ect with extra vegetables  plain burgers, grilled chicken  Vegetarian or cheese pizza with whole-grain crust    Avoid: Burgers/sandwiches  with bacon, cheese, and high-fat sauces  [place] fries, fried chicken, fried fish, poutine, hash browns  Pizza with processed meats  Starters   Choose more often: Raw vegetables, salads (garden, spinach, fruit)  clear or vegetable soups  Seafood cocktail  Whole-grain breads and rolls    Avoid: Salads with high-fat dressings or toppings  Creamy soups  Wings, egg rolls  onion rings, nachos  White or garlic bread  Main courses Grains & Starches (amount equal to  of your plate)  Choose more often:  Oatmeal, high-fiber/lower-sugar cereals  Whole-grain breads, rice, pasta, barley, couscous  Sweet potatoes    Avoid: Sugary, low-fiber cereals  Large bagels, muffins, croissants, white bread  [place] fries, hash browns, fried rice   Meat and alternative (amount equal to  of your plate)  Choose more often:  Lean meats, poultry, fish, eggs, low-fat cheese  Tofu, vegetable protein Legumes (e.g. lentils, chickpeas, beans)    Avoid: High-salt and/or high-fat meats (e.g. ribs, wings, sausages, wieners, processed lunch meats, imposter meats)   Vegetables (amount equal to  of your plate)  Choose more often:  Salads ([place], garden, spinach), plain vegetables   Avoid:  Salads with creamy, high-fat dressings and   Vegetables on sandwiches ect toppings like bacon bits, croutons, cheese  Desserts  Choose more often:  Fresh fruit, frozen yogourt, skim milk latte    Avoid: Cakes, pies, pastries, ice cream, cheesecake     Mediterranean Diet  Why follow it? Research shows. Those who follow the Mediterranean diet have a reduced risk of heart disease  The diet is associated with a reduced incidence of Parkinson's and Alzheimer's diseases People following the diet may have longer life expectancies and lower rates of chronic diseases  The Dietary Guidelines for Americans recommends the Mediterranean diet as an eating plan to promote health and prevent disease  What Is the Mediterranean  Diet?  Healthy eating plan based on typical foods and recipes of Mediterranean-style cooking The diet is primarily a plant based diet; these foods should make up a majority of meals   Starches - Plant based foods should make up a majority of meals - They are an important sources of vitamins, minerals, energy, antioxidants, and fiber - Choose whole grains, foods high in fiber and minimally processed items  - Typical grain sources include wheat, oats, barley, corn, brown rice, bulgar, farro, millet, polenta, couscous  - Various types of beans include chickpeas, lentils, fava beans, black beans, white beans   Fruits  Veggies - Large quantities of antioxidant rich fruits & veggies; 6 or more servings  - Vegetables can be eaten raw or lightly drizzled with oil and cooked  - Vegetables common to the traditional Mediterranean Diet include: artichokes, arugula, beets, broccoli, brussel sprouts, cabbage, carrots, celery, collard greens, cucumbers, eggplant, kale, leeks, lemons, lettuce, mushrooms, okra, onions, peas, peppers, potatoes, pumpkin, radishes, rutabaga, shallots, spinach, sweet potatoes, turnips, zucchini - Fruits common to the Mediterranean Diet include: apples, apricots, avocados, cherries, clementines, dates, figs, grapefruits, grapes, melons, nectarines, oranges, peaches, pears, pomegranates, strawberries, tangerines  Fats - Replace butter and margarine with healthy oils, such as olive oil, canola oil, and tahini  - Limit nuts to no more than a handful a day  - Nuts include walnuts, almonds, pecans, pistachios, pine nuts  - Limit or avoid candied, honey roasted or heavily salted nuts - Olives are central to the [employer] - can be eaten whole or used in a variety of dishes   Meats Protein - Limiting red meat: no more than a few times a month - When eating red meat: choose lean cuts and keep the portion to the size of deck of cards - Eggs: approx. 0  to 4 times a week  - Fish and  lean poultry: at least 2 a week  - Healthy protein sources include, chicken, [place], lean beef, lamb - Increase intake of seafood such as tuna, salmon, trout, mackerel, shrimp, scallops - Avoid or limit high fat processed meats such as sausage and bacon  Dairy - Include moderate amounts of low fat dairy products  - Focus on healthy dairy such as fat free yogurt, skim milk, low or reduced fat cheese - Limit dairy products higher in fat such as whole or 2% milk, cheese, ice cream  Alcohol  - Moderate amounts of red wine is ok  - No more than 5 oz daily for women (all ages) and men older than age 60  - No more than 10 oz of wine daily for men younger than 70  Other - Limit sweets and other desserts  - Use herbs and spices instead of salt to flavor foods  - Herbs and spices common to the traditional Mediterranean Diet include: basil, bay leaves, chives, cloves, cumin, fennel, garlic, lavender, marjoram, mint, oregano, parsley, pepper, rosemary, sage, savory, sumac, tarragon, thyme   It's not just a diet, it's a lifestyle:  The Mediterranean diet includes lifestyle factors typical of those in the region  Foods, drinks and meals are best eaten with others and savored Daily physical activity is important for overall good health This could be strenuous exercise like running and aerobics This could also be more leisurely activities such as walking, housework, yard-work, or taking the stairs Moderation is the key; a balanced and healthy diet accommodates most foods and drinks Consider portion sizes and frequency of consumption of certain foods   Meal Ideas & Options:  Breakfast:  Whole wheat toast or whole wheat English muffins with peanut butter & hard boiled egg Steel cut oats topped with apples & cinnamon and skim milk  Fresh fruit: banana, strawberries, melon, berries, peaches  Smoothies: strawberries, bananas, greek yogurt, peanut butter Low fat greek yogurt with blueberries and granola  Egg  white omelet with spinach and mushrooms Breakfast couscous: whole wheat couscous, apricots, skim milk, cranberries  Sandwiches:  Hummus and grilled vegetables (peppers, zucchini, squash) on whole wheat bread   Grilled chicken on whole wheat pita with lettuce, tomatoes, cucumbers or tzatziki  [place] salad on whole wheat bread: tuna salad made with greek yogurt, olives, red peppers, capers, green onions Garlic rosemary lamb pita: lamb sauted with garlic, rosemary, salt & pepper; add lettuce, cucumber, greek yogurt to pita - flavor with lemon juice and black pepper  Seafood:  Mediterranean grilled salmon, seasoned with garlic, basil, parsley, lemon juice and black pepper Shrimp, lemon, and spinach whole-grain pasta salad made with low fat greek yogurt  Seared scallops with lemon orzo  Seared tuna steaks seasoned salt, pepper, coriander topped with tomato mixture of olives, tomatoes, olive oil, minced garlic, parsley, green onions and cappers  Meats:  Herbed greek chicken salad with kalamata olives, cucumber, feta  Red bell peppers stuffed with spinach, bulgur, lean ground beef (or lentils) & topped with feta   Kebabs: skewers of chicken, tomatoes, onions, zucchini, squash  [place] burgers: made with red onions, mint, dill, lemon juice, feta cheese topped with roasted red peppers Vegetarian Cucumber salad: cucumbers, artichoke hearts, celery, red onion, feta cheese, tossed in olive oil & lemon juice  Hummus and whole grain pita points with a greek salad (lettuce, tomato, feta, olives, cucumbers, red onion) Lentil soup with celery, carrots made with vegetable broth, garlic,  salt and pepper  Tabouli salad: parsley, bulgur, mint, scallions, cucumbers, tomato, radishes, lemon juice, olive oil, salt and pepper.

## 2023-05-13 NOTE — Telephone Encounter (Signed)
 Left voicemail on nurse line for Dr. Luciana Ruth office [phone] to fax patient's lab results from 05/12/23 to our office fax [phone] for Slater Duncan, NP at [facility].

## 2023-05-13 NOTE — Telephone Encounter (Signed)
 Per [facility] lipid panel from yesterday reviewed. LDL is 76, goal is < 70. would recommend he increase rosuvastatin  to 20 mg daily. recheck NMR, ALT and LP(a) in 2-3 mo

## 2023-05-14 ENCOUNTER — Other Ambulatory Visit (HOSPITAL_BASED_OUTPATIENT_CLINIC_OR_DEPARTMENT_OTHER): Payer: Self-pay | Admitting: *Deleted

## 2023-05-14 DIAGNOSIS — I25118 Atherosclerotic heart disease of native coronary artery with other forms of angina pectoris: Secondary | ICD-10-CM

## 2023-05-14 DIAGNOSIS — R931 Abnormal findings on diagnostic imaging of heart and coronary circulation: Secondary | ICD-10-CM

## 2023-05-14 MED ORDER — ROSUVASTATIN CALCIUM 20 MG PO TABS: 20.0000 mg | ORAL_TABLET | Freq: Every day | ORAL | 11 refills | Status: DC

## 2023-05-14 NOTE — Telephone Encounter (Signed)
 S/w pt is aware of recommendations. Will increase rosuvastatin  one (1) tablet by mouth ( 20 mg) daily, sent in # 30 to requested pharmacy. Pt will have labs in 2-3 months after increased med, orders placed in system and released. Labs mailed to pt, address confirmed.

## 2023-05-19 ENCOUNTER — Ambulatory Visit (INDEPENDENT_AMBULATORY_CARE_PROVIDER_SITE_OTHER): Payer: Medicare (Managed Care) | Admitting: Physician Assistant

## 2023-05-19 ENCOUNTER — Encounter: Payer: Self-pay | Admitting: Physician Assistant

## 2023-05-19 VITALS — BP 120/68 | HR 105 | Ht 70.0 in | Wt 249.0 lb

## 2023-05-19 DIAGNOSIS — E1165 Type 2 diabetes mellitus with hyperglycemia: Secondary | ICD-10-CM | POA: Diagnosis not present

## 2023-05-19 DIAGNOSIS — Z789 Other specified health status: Secondary | ICD-10-CM | POA: Diagnosis not present

## 2023-05-19 DIAGNOSIS — M1712 Unilateral primary osteoarthritis, left knee: Secondary | ICD-10-CM | POA: Diagnosis not present

## 2023-05-19 DIAGNOSIS — D509 Iron deficiency anemia, unspecified: Secondary | ICD-10-CM

## 2023-05-19 DIAGNOSIS — K921 Melena: Secondary | ICD-10-CM

## 2023-05-19 DIAGNOSIS — I739 Peripheral vascular disease, unspecified: Secondary | ICD-10-CM | POA: Diagnosis not present

## 2023-05-19 DIAGNOSIS — E1121 Type 2 diabetes mellitus with diabetic nephropathy: Secondary | ICD-10-CM | POA: Diagnosis not present

## 2023-05-19 DIAGNOSIS — M1711 Unilateral primary osteoarthritis, right knee: Secondary | ICD-10-CM | POA: Diagnosis not present

## 2023-05-19 DIAGNOSIS — E782 Mixed hyperlipidemia: Secondary | ICD-10-CM | POA: Diagnosis not present

## 2023-05-19 DIAGNOSIS — I2510 Atherosclerotic heart disease of native coronary artery without angina pectoris: Secondary | ICD-10-CM | POA: Diagnosis not present

## 2023-05-19 DIAGNOSIS — M170 Bilateral primary osteoarthritis of knee: Secondary | ICD-10-CM | POA: Diagnosis not present

## 2023-05-19 DIAGNOSIS — C8442 Peripheral T-cell lymphoma, not classified, intrathoracic lymph nodes: Secondary | ICD-10-CM

## 2023-05-19 NOTE — Progress Notes (Signed)
 I agree with the assessment and plan as outlined by Ms. Lemmon. Would see what the patient's next Hb shows. If it is improved back to normal, then no need for VCE. If his Hb continues to drop or does not improve, then would plan for VCE for further evaluation.

## 2023-05-19 NOTE — Progress Notes (Signed)
 Chief Complaint: Melena  HPI:    [name] is a 73 year old African-American male with a past medical history as listed below including personal history of lymphoma with mets to the liver, skeleton, status post cholecystectomy, family history of colon cancer in his sister and a personal history of polyps, known to Dr. Rosaline Coma, who presents to clinic today for follow-up with melena.    02/09/2023 patient seen in clinic by Santina Cull, PA-C with melena associated with 2 to 3 g drop of hemoglobin, down from baseline of 15-16-13.1.  Discussed history of lymphoma with mets to the liver, no thrombocytopenia or portal hypertension.  Patient scheduled for EGD and colonoscopy with a recheck of CBC, iron , ferritin and B12.    03/17/2023 colonoscopy with four 4-6 mm polyps in transverse colon and ascending colon, 2 to 3-5 mm polyps in the rectum and descending colon nonbleeding internal hemorrhoids.  Pathology showed hyperplastic polyps and repeat recommended 10 years.    03/17/2023 EGD with esophageal mucosal variant, small hiatal hernia, gastritis with hemorrhage and duodenitis.  Patient given Pantoprazole  40 p.o. twice daily for 8 weeks and daily.  Pathology showed chronic gastritis.    03/27/2023 CBC with a hemoglobin of 12.1 (13.1 on 02/09/2023), ferritin low at 6 (4.43 months ago), iron  low at 38, percent saturation 8%, TIBC elevated 454.    05/11/23 patient underwent fifth iron  infusion.    Today, patient presents to clinic and tells me that his stools remain dark but they are no longer black like they were.  He stays on Pantoprazole  40 mg daily.  Continues to follow with hematology /oncology in regards to his iron  deficiency anemia and lymphoma.  He has repeat labs scheduled for 05/22/2023 at their clinic.  He is feeling well.    Denies fever, chills or weight loss.  Past Medical History:  Diagnosis Date   Arthritis    Cancer (HCC)    Hyperlipidemia    Hypertension    followed by pcp   (11-23-2019  per pt had stress test greater than 20 yrs ago, told ok)   Nocturia    OSA on CPAP    per pt uses nightly   Phimosis    Sleep apnea    Type 2 diabetes mellitus (HCC)    followed by pcp   (11-23-2019 per pt does not check blood sugar at home)   Wears glasses     Past Surgical History:  Procedure Laterality Date   APPENDECTOMY  child   CHOLECYSTECTOMY N/A 07/09/2020   Procedure: LAPAROSCOPIC CHOLECYSTECTOMY WITH INTRAOPERATIVE CHOLANGIOGRAM,;  Surgeon: Aldean Hummingbird, MD;  Location: [facility];  Service: General;  Laterality: N/A;   CIRCUMCISION N/A 11/29/2019   Procedure: CIRCUMCISION ADULT;  Surgeon: Sherlyn Ditto, MD;  Location: [facility];  Service: Urology;  Laterality: N/A;   IR IMAGING GUIDED PORT INSERTION  08/06/2020   IR REMOVAL TUN ACCESS W/ PORT W/O FL MOD SED  04/09/2021   LIVER BIOPSY N/A 07/09/2020   Procedure: LAP LIVER BIOPSY;  Surgeon: Aldean Hummingbird, MD;  Location: [facility];  Service: General;  Laterality: N/A;   ROTATOR CUFF REPAIR Left chld   SUPRA-UMBILICAL HERNIA N/A 07/09/2020   Procedure: PRIMARY REPAIR SUPRA-UMBILICAL HERNIA;  Surgeon: Aldean Hummingbird, MD;  Location: [facility];  Service: General;  Laterality: N/A;    Current Outpatient Medications  Medication Sig Dispense Refill   amLODipine  (NORVASC ) 2.5 MG tablet Take 2.5 mg by mouth daily.     aspirin 81 MG  EC tablet 1 tablet     azelastine (ASTELIN) 0.1 % nasal spray      buPROPion (WELLBUTRIN XL) 150 MG 24 hr tablet Take 150 mg by mouth every morning.     Continuous Glucose Sensor (FREESTYLE LIBRE 3 SENSOR) MISC USE AS DIRECTED. CHANGE EVERY 14 DAYS     fluticasone  (FLONASE ) 50 MCG/ACT nasal spray Place 2 sprays into both nostrils in the morning and at bedtime.     LANTUS SOLOSTAR 100 UNIT/ML Solostar Pen Inject 30 Units into the skin daily.     metFORMIN (GLUCOPHAGE) 1000 MG tablet Take 1,000 mg by mouth 2 (two) times daily.     metoprolol  tartrate (LOPRESSOR ) 50 MG tablet Take one (1)  tablet by mouth ( 50 mg) 2 hours prior to CT scan. 1 tablet 0   Multiple Vitamins-Minerals (CENTRUM SILVER PO) Take 1 tablet by mouth at bedtime.     olopatadine (PATANOL) 0.1 % ophthalmic solution Apply to eye.     pantoprazole  (PROTONIX ) 40 MG tablet TAKE 1 TABLET(40 MG) BY MOUTH TWICE DAILY 180 tablet 0   rosuvastatin  (CRESTOR ) 20 MG tablet Take 1 tablet (20 mg total) by mouth at bedtime. 30 tablet 11   RYBELSUS 7 MG TABS      valsartan-hydrochlorothiazide (DIOVAN-HCT) 320-25 MG tablet Take 1 tablet by mouth daily.     No current facility-administered medications for this visit.    Allergies as of 05/19/2023   (No Known Allergies)    Family History  Problem Relation Age of Onset   Hypotension Mother    Diabetes Mellitus II Father    Colon cancer Sister    Prostate cancer Brother    Prostate cancer Brother     Social History   Socioeconomic History   Marital status: Divorced    Spouse name: Not on file   Number of children: Not on file   Years of education: Not on file   Highest education level: Not on file  Occupational History   Not on file  Tobacco Use   Smoking status: Every Day    Current packs/day: 1.00    Average packs/day: 1 pack/day for 49.0 years (49.0 ttl pk-yrs)    Types: Cigars, Cigarettes   Smokeless tobacco: Never  Vaping Use   Vaping status: Never Used  Substance and Sexual Activity   Alcohol  use: Not Currently   Drug use: Never   Sexual activity: Not on file  Other Topics Concern   Not on file  Social History Narrative   Not on file   Social Drivers of Health   Financial Resource Strain: Not on file  Food Insecurity: Not on file  Transportation Needs: Not on file  Physical Activity: Not on file  Stress: Not on file  Social Connections: Unknown (06/03/2021)   Received from [facility]   Social Network    Social Network: Not on file  Intimate Partner Violence: Unknown (04/25/2021)   Received from [facility]   HITS    Physically Hurt: Not on file    Insult or Talk Down To: Not on file    Threaten Physical Harm: Not on file    Scream or Curse: Not on file    Review of Systems:    Constitutional: No weight loss, fever or chills Cardiovascular: No chest pain  Respiratory: No SOB Gastrointestinal: See HPI and otherwise negative   Physical Exam:  Vital signs: BP 120/68   Pulse (!) 105   Ht 5'  10" (1.778 m)   Wt 249 lb (112.9 kg)   BMI 35.73 kg/m    Constitutional:   Pleasant overweight AA male appears to be in NAD, Well developed, Well nourished, alert and cooperative Respiratory: Respirations even and unlabored. Lungs clear to auscultation bilaterally.   No wheezes, crackles, or rhonchi.  Cardiovascular: Normal S1, S2. No MRG. Regular rate and rhythm. No peripheral edema, cyanosis or pallor.  Gastrointestinal:  Soft, nondistended, nontender. No rebound or guarding. Normal bowel sounds. No appreciable masses or hepatomegaly. Rectal:  Not performed.  Psychiatric: Demonstrates good judgement and reason without abnormal affect or behaviors.  RELEVANT LABS AND IMAGING: CBC    Component Value Date/Time   WBC 4.7 03/27/2023 0929   WBC 5.3 02/09/2023 0942   RBC 4.57 03/27/2023 0929   HGB 12.1 (L) 03/27/2023 0929   HCT 37.9 (L) 03/27/2023 0929   PLT 222 03/27/2023 0929   MCV 82.9 03/27/2023 0929   MCH 26.5 03/27/2023 0929   MCHC 31.9 03/27/2023 0929   RDW 17.2 (H) 03/27/2023 0929   LYMPHSABS 1.7 03/27/2023 0929   MONOABS 0.5 03/27/2023 0929   EOSABS 0.3 03/27/2023 0929   BASOSABS 0.0 03/27/2023 0929    CMP     Component Value Date/Time   NA 133 (L) 01/23/2023 1033   K 3.9 01/23/2023 1033   CL 102 01/23/2023 1033   CO2 25 01/23/2023 1033   GLUCOSE 368 (H) 01/23/2023 1033   BUN 18 01/23/2023 1033   CREATININE 1.21 01/23/2023 1033   CALCIUM  9.0 01/23/2023 1033   PROT 7.0 01/23/2023 1033   ALBUMIN  3.8 01/23/2023 1033   AST 18 01/23/2023 1033   ALT 16 01/23/2023 1033    ALKPHOS 94 01/23/2023 1033   BILITOT 0.5 01/23/2023 1033   GFRNONAA >60 01/23/2023 1033   GFRAA >90 07/26/2011 2355    Assessment: 1.  Iron  deficiency anemia: Improving after iron  infusions, question relation to lymphoma 2.  Lymphoma with mets 3.  Melena: No further melena since time of EGD/colonoscopy, no etiology found at time of those procedures, mains on Pantoprazole   Plan: 1.  Will discuss with Dr. Rosaline Coma to see if she recommends a pill capsule endoscopy.  If so we can set this up for the patient. 2.  Patient to continue on Pantoprazole  40 mg daily 3.  Patient to follow in clinic per recommendations after discussion with Dr. Rosaline Coma as above.   Reginal Capra, PA-C [facility] 05/19/2023, 2:23 PM  Cc: Aleta Anda, MD

## 2023-05-20 ENCOUNTER — Encounter: Payer: Self-pay | Admitting: Hematology and Oncology

## 2023-05-21 ENCOUNTER — Other Ambulatory Visit: Payer: Self-pay | Admitting: Family Medicine

## 2023-05-21 DIAGNOSIS — I739 Peripheral vascular disease, unspecified: Secondary | ICD-10-CM

## 2023-05-22 ENCOUNTER — Other Ambulatory Visit: Payer: Self-pay | Admitting: Hematology and Oncology

## 2023-05-22 ENCOUNTER — Inpatient Hospital Stay: Payer: Medicare (Managed Care) | Attending: Hematology and Oncology

## 2023-05-22 DIAGNOSIS — D649 Anemia, unspecified: Secondary | ICD-10-CM | POA: Diagnosis not present

## 2023-05-22 DIAGNOSIS — D500 Iron deficiency anemia secondary to blood loss (chronic): Secondary | ICD-10-CM

## 2023-05-22 DIAGNOSIS — Z8572 Personal history of non-Hodgkin lymphomas: Secondary | ICD-10-CM | POA: Insufficient documentation

## 2023-05-22 LAB — RETIC PANEL
Immature Retic Fract: 15.4 % (ref 2.3–15.9)
RBC.: 5.04 MIL/uL (ref 4.22–5.81)
Retic Count, Absolute: 70.6 10*3/uL (ref 19.0–186.0)
Retic Ct Pct: 1.4 % (ref 0.4–3.1)
Reticulocyte Hemoglobin: 32.1 pg (ref >27.9)

## 2023-05-22 LAB — IRON AND IRON BINDING CAPACITY (CC-WL,HP ONLY)
Iron: 79 ug/dL (ref 45–182)
Saturation Ratios: 21 % (ref 17.9–39.5)
TIBC: 381 ug/dL (ref 250–450)
UIBC: 302 ug/dL (ref 117–376)

## 2023-05-22 LAB — CMP (CANCER CENTER ONLY)
ALT: 19 U/L (ref 0–44)
AST: 17 U/L (ref 15–41)
Albumin: 4.2 g/dL (ref 3.5–5.0)
Alkaline Phosphatase: 90 U/L (ref 38–126)
Anion gap: 7 (ref 5–15)
BUN: 30 mg/dL — ABNORMAL HIGH (ref 8–23)
CO2: 25 mmol/L (ref 22–32)
Calcium: 9.2 mg/dL (ref 8.9–10.3)
Chloride: 107 mmol/L (ref 98–111)
Creatinine: 1.35 mg/dL — ABNORMAL HIGH (ref 0.61–1.24)
GFR, Estimated: 55 mL/min — ABNORMAL LOW (ref >60)
Glucose, Bld: 179 mg/dL — ABNORMAL HIGH (ref 70–99)
Potassium: 3.8 mmol/L (ref 3.5–5.1)
Sodium: 139 mmol/L (ref 135–145)
Total Bilirubin: 0.4 mg/dL (ref 0.0–1.2)
Total Protein: 7.1 g/dL (ref 6.5–8.1)

## 2023-05-22 LAB — CBC WITH DIFFERENTIAL (CANCER CENTER ONLY)
Abs Immature Granulocytes: 0.02 10*3/uL (ref 0.00–0.07)
Basophils Absolute: 0 10*3/uL (ref 0.0–0.1)
Basophils Relative: 0 %
Eosinophils Absolute: 0.1 10*3/uL (ref 0.0–0.5)
Eosinophils Relative: 1 %
HCT: 41.4 % (ref 39.0–52.0)
Hemoglobin: 14 g/dL (ref 13.0–17.0)
Immature Granulocytes: 0 %
Lymphocytes Relative: 27 %
Lymphs Abs: 2 10*3/uL (ref 0.7–4.0)
MCH: 27.6 pg (ref 26.0–34.0)
MCHC: 33.8 g/dL (ref 30.0–36.0)
MCV: 81.7 fL (ref 80.0–100.0)
Monocytes Absolute: 0.7 10*3/uL (ref 0.1–1.0)
Monocytes Relative: 10 %
Neutro Abs: 4.5 10*3/uL (ref 1.7–7.7)
Neutrophils Relative %: 62 %
Platelet Count: 208 10*3/uL (ref 150–400)
RBC: 5.07 MIL/uL (ref 4.22–5.81)
RDW: 19 % — ABNORMAL HIGH (ref 11.5–15.5)
WBC Count: 7.3 10*3/uL (ref 4.0–10.5)
nRBC: 0 % (ref 0.0–0.2)

## 2023-05-22 LAB — FERRITIN: Ferritin: 388 ng/mL — ABNORMAL HIGH (ref 24–336)

## 2023-05-25 ENCOUNTER — Telehealth: Payer: Self-pay | Admitting: *Deleted

## 2023-05-25 NOTE — Telephone Encounter (Signed)
 TCT patient regarding recent lab results. Spoke with him. Advised that his blood work showed a markedly good response to the iron  therapy. His hemoglobin has normalized and his iron  stores are excellent. Will plan to see him as scheduled in July. Pt voiced understanding. He states he feels well.  He is aware of his appts in July.

## 2023-05-25 NOTE — Telephone Encounter (Signed)
-----   Message from Maurice Fox sent at 05/22/2023  3:49 PM EDT ----- Please let Maurice Fox know that his blood work showed a markedly good response to the iron  therapy.  His hemoglobin has normalized and his iron  stores are excellent.  Will plan to see him as scheduled in July. ----- Message ----- From: Maurice Fox, Lab In [place] Sent: 05/22/2023   9:52 AM EDT To: Maurice Bame, MD

## 2023-05-26 ENCOUNTER — Encounter (HOSPITAL_COMMUNITY): Payer: Self-pay

## 2023-05-26 ENCOUNTER — Telehealth (HOSPITAL_COMMUNITY): Payer: Self-pay | Admitting: *Deleted

## 2023-05-26 ENCOUNTER — Telehealth: Payer: Self-pay | Admitting: Nurse Practitioner

## 2023-05-26 ENCOUNTER — Telehealth: Payer: Self-pay | Admitting: *Deleted

## 2023-05-26 NOTE — Telephone Encounter (Signed)
 Returned call to pts son and walked through visit notes and plan for upcoming testing. Verified son can see message with testing instructions for tomorrow.

## 2023-05-26 NOTE — Telephone Encounter (Signed)
 Son is calling to ask question about what's going on with the patient, and what are the next steps. Please advise

## 2023-05-26 NOTE — Telephone Encounter (Signed)

## 2023-05-26 NOTE — Telephone Encounter (Signed)
-----   Message from Graciella Lavender sent at 05/25/2023 10:40 AM EDT ----- Regarding: please let patient know Please let patient know that his recent hemoglobin done at the cancer center is back to normal.  He does not need any further workup from our end for his iron  deficiency anemia.  Thanks, JL L

## 2023-05-27 ENCOUNTER — Ambulatory Visit (HOSPITAL_COMMUNITY)
Admission: RE | Admit: 2023-05-27 | Discharge: 2023-05-27 | Disposition: A | Discharge status: Home / Self Care | Payer: Medicare (Managed Care) | Source: Ambulatory Visit | Attending: Nurse Practitioner | Admitting: Nurse Practitioner

## 2023-05-27 ENCOUNTER — Other Ambulatory Visit (HOSPITAL_BASED_OUTPATIENT_CLINIC_OR_DEPARTMENT_OTHER): Payer: Self-pay | Admitting: Nurse Practitioner

## 2023-05-27 DIAGNOSIS — R0609 Other forms of dyspnea: Secondary | ICD-10-CM | POA: Insufficient documentation

## 2023-05-27 DIAGNOSIS — R931 Abnormal findings on diagnostic imaging of heart and coronary circulation: Secondary | ICD-10-CM | POA: Insufficient documentation

## 2023-05-27 DIAGNOSIS — I700 Atherosclerosis of aorta: Secondary | ICD-10-CM | POA: Insufficient documentation

## 2023-05-27 DIAGNOSIS — I10 Essential (primary) hypertension: Secondary | ICD-10-CM

## 2023-05-27 DIAGNOSIS — E1165 Type 2 diabetes mellitus with hyperglycemia: Secondary | ICD-10-CM

## 2023-05-27 DIAGNOSIS — F17210 Nicotine dependence, cigarettes, uncomplicated: Secondary | ICD-10-CM

## 2023-05-27 DIAGNOSIS — R9431 Abnormal electrocardiogram [ECG] [EKG]: Secondary | ICD-10-CM

## 2023-05-27 DIAGNOSIS — I25118 Atherosclerotic heart disease of native coronary artery with other forms of angina pectoris: Secondary | ICD-10-CM

## 2023-05-27 MED ORDER — NITROGLYCERIN 0.4 MG SL SUBL
SUBLINGUAL_TABLET | SUBLINGUAL | Status: AC
  Filled 2023-05-27: qty 2

## 2023-05-27 MED ORDER — NITROGLYCERIN 0.4 MG SL SUBL
0.8000 mg | SUBLINGUAL_TABLET | Freq: Once | SUBLINGUAL | Status: AC
  Administered 2023-05-27: 0.8 mg via SUBLINGUAL

## 2023-05-27 MED ORDER — IOHEXOL 350 MG/ML SOLN
100.0000 mL | Freq: Once | INTRAVENOUS | Status: AC | PRN
  Administered 2023-05-27: 100 mL via INTRAVENOUS

## 2023-05-27 NOTE — Telephone Encounter (Signed)
Patient informed of information below

## 2023-05-27 NOTE — Telephone Encounter (Signed)
 Left message for patient to call office.

## 2023-05-27 NOTE — Progress Notes (Addendum)
 Patient presents for cardiac CT.   1st attempt IV extravasation in LAC (~70cc).   Skin color normal, skin temperature normal, unbroken skin integrity, non-pitting edema noted at site, full mobility, +2 radial pulse, denies numbness or tingling in extremity.  Pain initially 6/10 at site.  Ice pack provided and extremity elevated. Pain resolved.  Reattempted scan after additional IV insertion-patient became bradycardic (HR 30s-40s), feeling "hot" and c/o chest pain 6/10.   Dr. Emmette Harms at bedside to assess.  BP 79/49 HR 46, 1 L saline initiated.   Chest pain resolved.  Scan aborted.   1L bolus completed and patient discharged from holding area (see notes), BP 111/64, HR 72.   Extravasation education provided to patient.

## 2023-05-27 NOTE — Progress Notes (Signed)
 Dr. Emmette Harms made aware of patient's improvement in BP. Pt denied dizziness when pt went from laying to standing.  Dr. Emmette Harms ok for patient to discharge after getting the full 1000ml of normal saline.

## 2023-05-27 NOTE — Progress Notes (Signed)
 Patient discharged after completion of 1000ml NaCl bolus.

## 2023-05-27 NOTE — Progress Notes (Signed)
 Flyer regarding IV extravasation education given to patient.  We reviewed the information on the flyer and patient verbalized understanding.  He will seek care overnight at the emergency department if he develops severe symptoms at his injection site on the right arm.

## 2023-05-30 ENCOUNTER — Encounter (HOSPITAL_BASED_OUTPATIENT_CLINIC_OR_DEPARTMENT_OTHER): Payer: Self-pay

## 2023-06-01 ENCOUNTER — Encounter (HOSPITAL_BASED_OUTPATIENT_CLINIC_OR_DEPARTMENT_OTHER): Payer: Self-pay | Admitting: *Deleted

## 2023-06-01 ENCOUNTER — Other Ambulatory Visit (HOSPITAL_BASED_OUTPATIENT_CLINIC_OR_DEPARTMENT_OTHER): Payer: Self-pay | Admitting: *Deleted

## 2023-06-01 DIAGNOSIS — I25118 Atherosclerotic heart disease of native coronary artery with other forms of angina pectoris: Secondary | ICD-10-CM

## 2023-06-01 DIAGNOSIS — R931 Abnormal findings on diagnostic imaging of heart and coronary circulation: Secondary | ICD-10-CM

## 2023-06-01 DIAGNOSIS — R0609 Other forms of dyspnea: Secondary | ICD-10-CM

## 2023-06-05 ENCOUNTER — Ambulatory Visit
Admission: RE | Admit: 2023-06-05 | Discharge: 2023-06-05 | Disposition: A | Discharge status: Home / Self Care | Payer: Medicare (Managed Care) | Source: Ambulatory Visit | Attending: Family Medicine | Admitting: Family Medicine

## 2023-06-05 ENCOUNTER — Other Ambulatory Visit: Payer: Self-pay | Admitting: Family Medicine

## 2023-06-05 DIAGNOSIS — I739 Peripheral vascular disease, unspecified: Secondary | ICD-10-CM

## 2023-06-05 DIAGNOSIS — M79661 Pain in right lower leg: Secondary | ICD-10-CM | POA: Diagnosis not present

## 2023-06-05 DIAGNOSIS — M79662 Pain in left lower leg: Secondary | ICD-10-CM | POA: Diagnosis not present

## 2023-06-18 DIAGNOSIS — J309 Allergic rhinitis, unspecified: Secondary | ICD-10-CM | POA: Diagnosis not present

## 2023-06-18 DIAGNOSIS — I10 Essential (primary) hypertension: Secondary | ICD-10-CM | POA: Diagnosis not present

## 2023-06-18 DIAGNOSIS — Z7985 Long-term (current) use of injectable non-insulin antidiabetic drugs: Secondary | ICD-10-CM | POA: Diagnosis not present

## 2023-06-18 DIAGNOSIS — I739 Peripheral vascular disease, unspecified: Secondary | ICD-10-CM | POA: Diagnosis not present

## 2023-06-18 DIAGNOSIS — Z7984 Long term (current) use of oral hypoglycemic drugs: Secondary | ICD-10-CM | POA: Diagnosis not present

## 2023-06-18 DIAGNOSIS — E1165 Type 2 diabetes mellitus with hyperglycemia: Secondary | ICD-10-CM | POA: Diagnosis not present

## 2023-06-18 DIAGNOSIS — E1159 Type 2 diabetes mellitus with other circulatory complications: Secondary | ICD-10-CM | POA: Diagnosis not present

## 2023-06-18 DIAGNOSIS — M170 Bilateral primary osteoarthritis of knee: Secondary | ICD-10-CM | POA: Diagnosis not present

## 2023-06-22 ENCOUNTER — Encounter: Payer: Self-pay | Admitting: Family Medicine

## 2023-06-24 DIAGNOSIS — I2510 Atherosclerotic heart disease of native coronary artery without angina pectoris: Secondary | ICD-10-CM | POA: Diagnosis not present

## 2023-06-24 DIAGNOSIS — J439 Emphysema, unspecified: Secondary | ICD-10-CM | POA: Diagnosis not present

## 2023-06-24 DIAGNOSIS — J069 Acute upper respiratory infection, unspecified: Secondary | ICD-10-CM | POA: Diagnosis not present

## 2023-06-25 ENCOUNTER — Other Ambulatory Visit (HOSPITAL_BASED_OUTPATIENT_CLINIC_OR_DEPARTMENT_OTHER): Payer: Self-pay

## 2023-06-25 ENCOUNTER — Emergency Department (HOSPITAL_BASED_OUTPATIENT_CLINIC_OR_DEPARTMENT_OTHER)
Admission: EM | Admit: 2023-06-25 | Discharge: 2023-06-25 | Disposition: A | Discharge status: Home / Self Care | Attending: Emergency Medicine | Admitting: Emergency Medicine

## 2023-06-25 ENCOUNTER — Encounter: Payer: Self-pay | Admitting: Hematology and Oncology

## 2023-06-25 ENCOUNTER — Other Ambulatory Visit: Payer: Self-pay

## 2023-06-25 ENCOUNTER — Encounter (HOSPITAL_BASED_OUTPATIENT_CLINIC_OR_DEPARTMENT_OTHER): Payer: Self-pay | Admitting: Emergency Medicine

## 2023-06-25 ENCOUNTER — Emergency Department (HOSPITAL_BASED_OUTPATIENT_CLINIC_OR_DEPARTMENT_OTHER): Admitting: Radiology

## 2023-06-25 DIAGNOSIS — G4733 Obstructive sleep apnea (adult) (pediatric): Secondary | ICD-10-CM | POA: Diagnosis not present

## 2023-06-25 DIAGNOSIS — Z8572 Personal history of non-Hodgkin lymphomas: Secondary | ICD-10-CM | POA: Insufficient documentation

## 2023-06-25 DIAGNOSIS — I10 Essential (primary) hypertension: Secondary | ICD-10-CM | POA: Diagnosis not present

## 2023-06-25 DIAGNOSIS — Z7982 Long term (current) use of aspirin: Secondary | ICD-10-CM | POA: Diagnosis not present

## 2023-06-25 DIAGNOSIS — J181 Lobar pneumonia, unspecified organism: Secondary | ICD-10-CM | POA: Diagnosis not present

## 2023-06-25 DIAGNOSIS — Z7901 Long term (current) use of anticoagulants: Secondary | ICD-10-CM | POA: Diagnosis not present

## 2023-06-25 DIAGNOSIS — Z7984 Long term (current) use of oral hypoglycemic drugs: Secondary | ICD-10-CM | POA: Diagnosis not present

## 2023-06-25 DIAGNOSIS — I2510 Atherosclerotic heart disease of native coronary artery without angina pectoris: Secondary | ICD-10-CM | POA: Diagnosis not present

## 2023-06-25 DIAGNOSIS — R0602 Shortness of breath: Secondary | ICD-10-CM | POA: Diagnosis not present

## 2023-06-25 DIAGNOSIS — E119 Type 2 diabetes mellitus without complications: Secondary | ICD-10-CM | POA: Diagnosis not present

## 2023-06-25 DIAGNOSIS — J168 Pneumonia due to other specified infectious organisms: Secondary | ICD-10-CM | POA: Diagnosis not present

## 2023-06-25 DIAGNOSIS — Z79899 Other long term (current) drug therapy: Secondary | ICD-10-CM | POA: Insufficient documentation

## 2023-06-25 DIAGNOSIS — E871 Hypo-osmolality and hyponatremia: Secondary | ICD-10-CM | POA: Diagnosis not present

## 2023-06-25 DIAGNOSIS — Z794 Long term (current) use of insulin: Secondary | ICD-10-CM | POA: Diagnosis not present

## 2023-06-25 DIAGNOSIS — J439 Emphysema, unspecified: Secondary | ICD-10-CM | POA: Diagnosis not present

## 2023-06-25 DIAGNOSIS — R059 Cough, unspecified: Secondary | ICD-10-CM | POA: Diagnosis present

## 2023-06-25 DIAGNOSIS — J189 Pneumonia, unspecified organism: Secondary | ICD-10-CM | POA: Diagnosis not present

## 2023-06-25 LAB — CBC WITH DIFFERENTIAL/PLATELET
Abs Immature Granulocytes: 0.03 10*3/uL (ref 0.00–0.07)
Basophils Absolute: 0 10*3/uL (ref 0.0–0.1)
Basophils Relative: 1 %
Eosinophils Absolute: 0 10*3/uL (ref 0.0–0.5)
Eosinophils Relative: 0 %
HCT: 40.8 % (ref 39.0–52.0)
Hemoglobin: 13.5 g/dL (ref 13.0–17.0)
Immature Granulocytes: 0 %
Lymphocytes Relative: 14 %
Lymphs Abs: 1.1 10*3/uL (ref 0.7–4.0)
MCH: 27.8 pg (ref 26.0–34.0)
MCHC: 33.1 g/dL (ref 30.0–36.0)
MCV: 84 fL (ref 80.0–100.0)
Monocytes Absolute: 1 10*3/uL (ref 0.1–1.0)
Monocytes Relative: 12 %
Neutro Abs: 6.2 10*3/uL (ref 1.7–7.7)
Neutrophils Relative %: 73 %
Platelets: 212 10*3/uL (ref 150–400)
RBC: 4.86 MIL/uL (ref 4.22–5.81)
RDW: 16.8 % — ABNORMAL HIGH (ref 11.5–15.5)
WBC: 8.5 10*3/uL (ref 4.0–10.5)
nRBC: 0 % (ref 0.0–0.2)

## 2023-06-25 LAB — URINALYSIS, W/ REFLEX TO CULTURE (INFECTION SUSPECTED)
Bacteria, UA: NONE SEEN
Bilirubin Urine: NEGATIVE
Glucose, UA: 500 mg/dL — AB
Hgb urine dipstick: NEGATIVE
Ketones, ur: NEGATIVE mg/dL
Leukocytes,Ua: NEGATIVE
Nitrite: NEGATIVE
Protein, ur: 30 mg/dL — AB
Specific Gravity, Urine: 1.016 (ref 1.005–1.030)
pH: 5.5 (ref 5.0–8.0)

## 2023-06-25 LAB — COMPREHENSIVE METABOLIC PANEL WITH GFR
ALT: 15 U/L (ref 0–44)
AST: 17 U/L (ref 15–41)
Albumin: 3.7 g/dL (ref 3.5–5.0)
Alkaline Phosphatase: 88 U/L (ref 38–126)
Anion gap: 17 — ABNORMAL HIGH (ref 5–15)
BUN: 34 mg/dL — ABNORMAL HIGH (ref 8–23)
CO2: 19 mmol/L — ABNORMAL LOW (ref 22–32)
Calcium: 9.2 mg/dL (ref 8.9–10.3)
Chloride: 97 mmol/L — ABNORMAL LOW (ref 98–111)
Creatinine, Ser: 1.73 mg/dL — ABNORMAL HIGH (ref 0.61–1.24)
GFR, Estimated: 41 mL/min — ABNORMAL LOW (ref >60)
Glucose, Bld: 336 mg/dL — ABNORMAL HIGH (ref 70–99)
Potassium: 3.8 mmol/L (ref 3.5–5.1)
Sodium: 133 mmol/L — ABNORMAL LOW (ref 135–145)
Total Bilirubin: 0.4 mg/dL (ref 0.0–1.2)
Total Protein: 7.5 g/dL (ref 6.5–8.1)

## 2023-06-25 LAB — PROTIME-INR
INR: 1.1 (ref 0.8–1.2)
Prothrombin Time: 14.3 s (ref 11.4–15.2)

## 2023-06-25 LAB — RESP PANEL BY RT-PCR (RSV, FLU A&B, COVID)  RVPGX2
Influenza A by PCR: NEGATIVE
Influenza B by PCR: NEGATIVE
Resp Syncytial Virus by PCR: NEGATIVE
SARS Coronavirus 2 by RT PCR: NEGATIVE

## 2023-06-25 LAB — LACTIC ACID, PLASMA: Lactic Acid, Venous: 1.8 mmol/L (ref 0.5–1.9)

## 2023-06-25 MED ORDER — SODIUM CHLORIDE 0.9 % IV SOLN
500.0000 mg | Freq: Once | INTRAVENOUS | Status: AC
  Administered 2023-06-25: 500 mg via INTRAVENOUS
  Filled 2023-06-25: qty 5

## 2023-06-25 MED ORDER — SODIUM CHLORIDE 0.9 % IV SOLN
1.0000 g | Freq: Once | INTRAVENOUS | Status: AC
  Administered 2023-06-25: 1 g via INTRAVENOUS
  Filled 2023-06-25: qty 10

## 2023-06-25 MED ORDER — AZITHROMYCIN 250 MG PO TABS
250.0000 mg | ORAL_TABLET | Freq: Every day | ORAL | 0 refills | Status: DC
  Filled 2023-06-25: qty 4, 4d supply, fill #0

## 2023-06-25 MED ORDER — AZITHROMYCIN 250 MG PO TABS: 250.0000 mg | ORAL_TABLET | Freq: Every day | ORAL | 0 refills | Status: DC

## 2023-06-25 MED ORDER — AMOXICILLIN-POT CLAVULANATE 875-125 MG PO TABS
1.0000 | ORAL_TABLET | Freq: Two times a day (BID) | ORAL | 0 refills | Status: DC
Start: 2023-06-25 — End: 2023-09-03
  Filled 2023-06-25 (×2): qty 14, 7d supply, fill #0

## 2023-06-25 MED ORDER — SODIUM CHLORIDE 0.9 % IV BOLUS
1000.0000 mL | Freq: Once | INTRAVENOUS | Status: AC
  Administered 2023-06-25: 1000 mL via INTRAVENOUS

## 2023-06-25 MED ORDER — AMOXICILLIN-POT CLAVULANATE 875-125 MG PO TABS: 1.0000 | ORAL_TABLET | Freq: Two times a day (BID) | ORAL | 0 refills | Status: DC

## 2023-06-25 NOTE — ED Provider Notes (Signed)
 [place] EMERGENCY DEPARTMENT AT [facility] Provider Note   CSN: [id number] Arrival date & time: 06/25/23  1138     History  Chief Complaint  Patient presents with   Pneumonia    Maurice Galla Sr. is a 73 y.o. male.  Patient with history of type 2 diabetes, hyperlipidemia, hypertension, T-cell lymphoma, coronary artery disease --presents to the emergency department for evaluation of shortness of breath and cough.  Symptoms started 3 days ago.  He states that symptoms were more significant in onset.  He has had some shortness of breath doing chores at home which have slowed him down.  He denies fevers.  Cough is nonproductive.  No vomiting or diarrhea.  No abdominal pain.  Patient did see APP at his PCP office today who referred him to the emergency department.  He is unsure as to what they were worried about in particular.       Home Medications Prior to Admission medications   Medication Sig Start Date End Date Taking? Authorizing Provider  benzonatate (TESSALON) 200 MG capsule Take 200 mg by mouth 3 (three) times daily as needed. 06/24/23  Yes [provider]  predniSONE  (DELTASONE ) 20 MG tablet Take 20 mg by mouth 2 (two) times daily. 06/24/23  Yes [provider]  rosuvastatin  (CRESTOR ) 40 MG tablet Take 40 mg by mouth at bedtime. 06/02/23  Yes [provider]  tamsulosin  (FLOMAX ) 0.4 MG CAPS capsule Take 0.4 mg by mouth daily. 06/06/23  Yes [provider]  amLODipine  (NORVASC ) 2.5 MG tablet Take 2.5 mg by mouth daily. 12/24/22   [provider]  aspirin 81 MG EC tablet 1 tablet    [provider]  azelastine (ASTELIN) 0.1 % nasal spray     [provider]  buPROPion (WELLBUTRIN XL) 150 MG 24 hr tablet Take 150 mg by mouth every morning. 02/14/23   [provider]  clopidogrel (PLAVIX) 75 MG tablet Take 75 mg by mouth daily.    [provider]  Continuous Glucose Sensor (FREESTYLE LIBRE 3  SENSOR) MISC USE AS DIRECTED. CHANGE EVERY 14 DAYS 02/04/23   [provider]  fluticasone  (FLONASE ) 50 MCG/ACT nasal spray Place 2 sprays into both nostrils in the morning and at bedtime.    [provider]  LANTUS SOLOSTAR 100 UNIT/ML Solostar Pen Inject 40 Units into the skin daily. 02/27/23   [provider]  metFORMIN (GLUCOPHAGE) 1000 MG tablet Take 1,000 mg by mouth 2 (two) times daily. 02/11/23   [provider]  metoprolol  tartrate (LOPRESSOR ) 50 MG tablet Take one (1) tablet by mouth ( 50 mg) 2 hours prior to CT scan. 05/13/23   Swinyer, Leilani Punter, NP  Multiple Vitamins-Minerals (CENTRUM SILVER PO) Take 1 tablet by mouth at bedtime.    [provider]  olopatadine (PATANOL) 0.1 % ophthalmic solution Apply to eye. 07/28/22   [provider]  pantoprazole  (PROTONIX ) 40 MG tablet TAKE 1 TABLET(40 MG) BY MOUTH TWICE DAILY 03/17/23   [name], Ying C, MD  rosuvastatin  (CRESTOR ) 20 MG tablet Take 1 tablet (20 mg total) by mouth at bedtime. 05/14/23   Swinyer, Leilani Punter, NP  RYBELSUS 7 MG TABS  12/17/21   [provider]  valsartan-hydrochlorothiazide (DIOVAN-HCT) 320-25 MG tablet Take 1 tablet by mouth daily. 03/09/23   [provider]      Allergies    Patient has no known allergies.    Review of Systems   Review of Systems  Physical  Exam Updated Vital Signs BP 130/70   Pulse (!) 103   Temp 98.1 F (36.7 C)   Resp (!) 24   Ht 5\' 10"  (1.778 m)   Wt 111.1 kg   SpO2 95%   BMI 35.15 kg/m  Physical Exam Vitals and nursing note reviewed.  Constitutional:      General: He is not in acute distress.    Appearance: He is well-developed.  HENT:     Head: Normocephalic and atraumatic.     Mouth/Throat:     Mouth: Mucous membranes are moist.  Eyes:     General:        Right eye: No discharge.        Left eye: No discharge.     Conjunctiva/sclera: Conjunctivae normal.  Cardiovascular:     Rate and Rhythm: Regular  rhythm. Tachycardia present.     Heart sounds: Normal heart sounds.  Pulmonary:     Effort: Pulmonary effort is normal.     Breath sounds: Examination of the right-lower field reveals rales. Rales present.  Abdominal:     Palpations: Abdomen is soft.     Tenderness: There is no abdominal tenderness. There is no guarding or rebound.  Musculoskeletal:     Cervical back: Normal range of motion and neck supple.     Right lower leg: No edema.     Left lower leg: No edema.  Skin:    General: Skin is warm and dry.  Neurological:     Mental Status: He is alert.     ED Results / Procedures / Treatments   Labs (all labs ordered are listed, but only abnormal results are displayed) Labs Reviewed  COMPREHENSIVE METABOLIC PANEL WITH GFR - Abnormal; Notable for the following components:      Result Value   Sodium 133 (*)    Chloride 97 (*)    CO2 19 (*)    Glucose, Bld 336 (*)    BUN 34 (*)    Creatinine, Ser 1.73 (*)    GFR, Estimated 41 (*)    Anion gap 17 (*)    All other components within normal limits  CBC WITH DIFFERENTIAL/PLATELET - Abnormal; Notable for the following components:   RDW 16.8 (*)    All other components within normal limits  URINALYSIS, W/ REFLEX TO CULTURE (INFECTION SUSPECTED) - Abnormal; Notable for the following components:   Glucose, UA 500 (*)    Protein, ur 30 (*)    All other components within normal limits  RESP PANEL BY RT-PCR (RSV, FLU A&B, COVID)  RVPGX2  CULTURE, BLOOD (ROUTINE X 2)  CULTURE, BLOOD (ROUTINE X 2)  LACTIC ACID, PLASMA  PROTIME-INR    EKG EKG Interpretation Date/Time:  Thursday June 25 2023 11:51:32 EDT Ventricular Rate:  117 PR Interval:  140 QRS Duration:  144 QT Interval:  383 QTC Calculation: 535 R Axis:   266  Text Interpretation: Sinus tachycardia Supraventricular bigeminy Right bundle branch block Confirmed by Jerald Molly [phone]) on 06/25/2023 12:33:57 PM  Radiology DG Chest 2 View Result Date: 06/25/2023 CLINICAL  DATA:  Cough. EXAM: CHEST - 2 VIEW COMPARISON:  August 02, 2020. FINDINGS: Stable cardiomediastinal silhouette. Left lung is clear. Mild right basilar atelectasis or infiltrate is noted. Bony thorax is unremarkable. IMPRESSION: Mild right basilar atelectasis or pneumonia is noted. Followup PA and lateral chest X-ray is recommended in 3-4 weeks following trial of antibiotic therapy to ensure resolution and exclude underlying malignancy. Electronically Signed   By: Royston Cornea  Alaine Alken M.D.   On: 06/25/2023 12:32    Procedures Procedures    Medications Ordered in ED Medications  sodium chloride  0.9 % bolus 1,000 mL (has no administration in time range)    ED Course/ Medical Decision Making/ A&P Clinical Course as of 06/25/23 1553  Thu Jun 25, 2023  1446 This is a 73 year old man with a history of diabetes presenting to ED with shortness of breath and fatigue ongoing for about 3 days.  Patient has had some mild tachycardia improved with fluids here in the ED, heart rate 90s to my evaluation.  He is afebrile.  Very mild tachypnea, no hypoxia.  Workup was concerning for pneumonia.  White blood cell count is normal.  His glucose is elevated at 336, for which she takes 40 units of insulin  daily, but he reports he had a "huge pack of pancakes this morning with syrup".  Very minor anion gap of 17.  Patient was given fluids and antibiotics for community pneumonia.  Given his age and comorbidities, elevated curb 65 score, and encouraged the patient to be admitted to the hospital, but he repeatedly refused admission.  Prefers conservative care at home with oral antibiotics.  He understands that pneumonia can be a life-threatening medical condition, particularly with diabetes complications.  But he has a follow-up appointment with his doctor in 3 days, and feels comfortable returning if his symptoms were to worsen.  Therefore the patient will be discharged home following antibiotics.  He did initially have a sepsis  workup with blood cultures drawn by of a fairly low suspicion for bacteremia.  Lactate is normal. [MT]    Clinical Course User Index [MT] Trifan, Janalyn Me, MD    Patient seen and examined. History obtained directly from patient.  Reviewed outpatient oncology notes.  Labs/EKG: Ordered CBC with normal white blood cell count and hemoglobin; CMP creatinine elevated at 1.73 with a BUN of 34, glucose elevated at 336 with slightly low bicarb at 19 and anion gap at 17, potassium normal at 3.8; lactate normal at 1.8, PT/INR ordered as part of sepsis order set to evaluate for signs of endorgan damage was normal; viral panel negative for flu, COVID, RSV.  Imaging: Ordered chest x-ray, personally reviewed and interpreted as above, demonstrates right basilar lung pneumonia.  Medications/Fluids: IV antibiotics with Rocephin  and azithromycin, IV fluid bolus  Most recent vital signs reviewed and are as follows: BP 130/70   Pulse (!) 103   Temp 98.1 F (36.7 C)   Resp (!) 24   Ht 5\' 10"  (1.778 m)   Wt 111.1 kg   SpO2 95%   BMI 35.15 kg/m   Initial impression: Community-acquired pneumonia, right base  Patient has risk class IV port score, 8.2-9.3% mortality.  Hospitalization recommended.  Patient does not want to be admitted to the hospital.  Patient discussed with and seen by Dr. Gordon Latus.  Will plan to give a dose of IV antibiotics here as well as IV fluids.    Patient was ambulated on pulse ox.  Oxygen level was 94-95%.  Heart rate was 116-125.  3:57 PM Reassessment performed. Patient appears stable.  Patient discussed with and seen earlier by Dr. Gordon Latus.     Labs personally reviewed and interpreted including: UA without evidence of UTI.  Reviewed pertinent lab work and imaging with patient at bedside.  We discussed his risk factor profile in regards to pneumonia.  Discussed that he does have a significant amount of morbidity and mortality risk based  on scoring system today.  Questions  answered.  Patient does not want to be admitted to the hospital.  He states that he has follow-up with his doctor on Monday and is willing to keep this appointment.  I strongly encouraged the patient to consider return if he changes his mind or if he develops worsening symptoms including worsening shortness of breath or trouble breathing, persistent vomiting and cannot keep down his medications.  Most current vital signs reviewed and are as follows: BP 130/70   Pulse (!) 103   Temp 98.1 F (36.7 C)   Resp (!) 24   Ht 5\' 10"  (1.778 m)   Wt 111.1 kg   SpO2 95%   BMI 35.15 kg/m   Plan: Discharge to home.   Prescriptions written for: Azithromycin and Augmentin  Other home care instructions discussed: Rest, hydration  ED return instructions discussed: Worsening shortness of breath, difficulty breathing, persistent vomiting, or if he changes his mind and feels worse and wants to be reassessed.  Follow-up instructions discussed: Patient encouraged to follow-up with their PCP ideally in 48 hours, however follow-up on Monday 6/9 if he can't before.                                 Medical Decision Making Amount and/or Complexity of Data Reviewed Labs: ordered. Radiology: ordered.   Patient with a community-acquired pneumonia.  He has had symptoms for about 4 days.  Given his history of neoplasm and kidney function today as well as age, he has approximately 8 to 9% mortality due to pneumonia.  Patient declines admission to the hospital.  We discussed risks and benefits.  Patient has elevated port score and we recommended admission to the hospital given mortality risk.  Patient again declines.  He does outpatient follow-up.  Otherwise low concern for ACS, PE, pneumothorax, dissection.        Final Clinical Impression(s) / ED Diagnoses Final diagnoses:  Pneumonia of right lower lobe due to infectious organism    Rx / DC Orders ED Discharge Orders          Ordered    azithromycin  (ZITHROMAX) 250 MG tablet  Daily        06/25/23 1554    amoxicillin-clavulanate (AUGMENTIN) 875-125 MG tablet  Every 12 hours        06/25/23 1554              Lyna Sandhoff, PA-C 06/25/23 1602    Arvilla Birmingham, MD 06/26/23 [phone]

## 2023-06-25 NOTE — Discharge Instructions (Signed)
 Please read and follow all provided instructions.  Your diagnoses today include:  1. Pneumonia of right lower lobe due to infectious organism     Tests performed today include: Complete blood cell count: Normal white blood cell count Complete metabolic panel: Your blood sugar is elevated at 336, your kidney function is weak EKG: Chest x-ray: Shows significant right lower lobe pneumonia Urinalysis (urine test): Shows some sugar but no signs of UTI Flu, COVID, RSV testing: Was negative Lactate which is a test for severe infection or sepsis: Was normal Blood cultures are pending Pregnancy test (urine or blood, in women only):  Medications prescribed:  Azithromycin - antibiotic for respiratory infection  You have been prescribed an antibiotic medicine: take the entire course of medicine even if you are feeling better. Stopping early can cause the antibiotic not to work.  Augmentin -antibiotic for pneumonia  Take any prescribed medications only as directed.  Home care instructions:  Follow any educational materials contained in this packet.  Take the complete course of antibiotics that you were prescribed.   BE VERY CAREFUL not to take multiple medicines containing Tylenol  (also called acetaminophen ). Doing so can lead to an overdose which can damage your liver and cause liver failure and possibly death.   Follow-up instructions: Please follow-up with your primary care provider in the next 2 days for further evaluation of your symptoms and to ensure resolution of your infection.   Return instructions:  Please return to the Emergency Department if you experience worsening symptoms.  Return immediately with worsening breathing, worsening shortness of breath, or if you feel it is taking you more effort to breathe.  Return if you change your mind and are willing to be admitted to the hospital. Please return if you have any other emergent concerns.  Additional Information:  Your  vital signs today were: BP 130/70   Pulse (!) 103   Temp 98.1 F (36.7 C)   Resp (!) 24   Ht 5\' 10"  (1.778 m)   Wt 111.1 kg   SpO2 95%   BMI 35.15 kg/m  If your blood pressure (BP) was elevated above 135/85 this visit, please have this repeated by your doctor within one month. --------------

## 2023-06-25 NOTE — ED Notes (Signed)
 Ambulating oxygen sats 94-95%, heart rate 116 resting, highest ambulating 125. Pt denies any shortness of breath

## 2023-06-25 NOTE — ED Notes (Signed)
 Provider notified of pt meeting sepsis criteria

## 2023-06-25 NOTE — ED Notes (Signed)
 Dc instructions reviewed with patient. Patient voiced understanding. Dc with belongings.

## 2023-06-25 NOTE — ED Triage Notes (Signed)
 Seen at PCP sent for PNA. Soreness and sob since Monday Diarrhea x "couple days" Right side neck pain Denies fever

## 2023-06-29 DIAGNOSIS — I10 Essential (primary) hypertension: Secondary | ICD-10-CM | POA: Diagnosis not present

## 2023-06-29 DIAGNOSIS — J189 Pneumonia, unspecified organism: Secondary | ICD-10-CM | POA: Diagnosis not present

## 2023-06-30 LAB — CULTURE, BLOOD (ROUTINE X 2)
Culture: NO GROWTH
Culture: NO GROWTH
Special Requests: ADEQUATE
Special Requests: ADEQUATE

## 2023-07-13 ENCOUNTER — Encounter: Payer: Self-pay | Admitting: Hematology and Oncology

## 2023-07-21 DIAGNOSIS — Z1159 Encounter for screening for other viral diseases: Secondary | ICD-10-CM | POA: Diagnosis not present

## 2023-07-21 DIAGNOSIS — J181 Lobar pneumonia, unspecified organism: Secondary | ICD-10-CM | POA: Diagnosis not present

## 2023-07-21 DIAGNOSIS — E1165 Type 2 diabetes mellitus with hyperglycemia: Secondary | ICD-10-CM | POA: Diagnosis not present

## 2023-07-21 DIAGNOSIS — J069 Acute upper respiratory infection, unspecified: Secondary | ICD-10-CM | POA: Diagnosis not present

## 2023-07-21 DIAGNOSIS — J189 Pneumonia, unspecified organism: Secondary | ICD-10-CM | POA: Diagnosis not present

## 2023-07-21 DIAGNOSIS — J449 Chronic obstructive pulmonary disease, unspecified: Secondary | ICD-10-CM | POA: Diagnosis not present

## 2023-07-22 DIAGNOSIS — J189 Pneumonia, unspecified organism: Secondary | ICD-10-CM | POA: Diagnosis not present

## 2023-07-23 ENCOUNTER — Other Ambulatory Visit: Payer: Self-pay

## 2023-07-23 ENCOUNTER — Encounter: Payer: Self-pay | Admitting: Hematology and Oncology

## 2023-07-23 ENCOUNTER — Other Ambulatory Visit: Payer: Self-pay | Admitting: Hematology and Oncology

## 2023-07-23 ENCOUNTER — Inpatient Hospital Stay (HOSPITAL_BASED_OUTPATIENT_CLINIC_OR_DEPARTMENT_OTHER): Payer: Medicare (Managed Care) | Admitting: Hematology and Oncology

## 2023-07-23 ENCOUNTER — Inpatient Hospital Stay: Payer: Medicare (Managed Care) | Attending: Hematology and Oncology

## 2023-07-23 VITALS — BP 123/75 | HR 94 | Temp 97.4°F | Resp 19 | Wt 242.5 lb

## 2023-07-23 DIAGNOSIS — Z8572 Personal history of non-Hodgkin lymphomas: Secondary | ICD-10-CM | POA: Insufficient documentation

## 2023-07-23 DIAGNOSIS — C8442 Peripheral T-cell lymphoma, not classified, intrathoracic lymph nodes: Secondary | ICD-10-CM

## 2023-07-23 DIAGNOSIS — F17210 Nicotine dependence, cigarettes, uncomplicated: Secondary | ICD-10-CM | POA: Diagnosis not present

## 2023-07-23 LAB — CMP (CANCER CENTER ONLY)
ALT: 54 U/L — ABNORMAL HIGH (ref 0–44)
AST: 51 U/L — ABNORMAL HIGH (ref 15–41)
Albumin: 4 g/dL (ref 3.5–5.0)
Alkaline Phosphatase: 85 U/L (ref 38–126)
Anion gap: 6 (ref 5–15)
BUN: 13 mg/dL (ref 8–23)
CO2: 26 mmol/L (ref 22–32)
Calcium: 9.1 mg/dL (ref 8.9–10.3)
Chloride: 106 mmol/L (ref 98–111)
Creatinine: 1.25 mg/dL — ABNORMAL HIGH (ref 0.61–1.24)
GFR, Estimated: >60 mL/min (ref >60)
Glucose, Bld: 148 mg/dL — ABNORMAL HIGH (ref 70–99)
Potassium: 4.1 mmol/L (ref 3.5–5.1)
Sodium: 138 mmol/L (ref 135–145)
Total Bilirubin: 0.4 mg/dL (ref 0.0–1.2)
Total Protein: 7.1 g/dL (ref 6.5–8.1)

## 2023-07-23 LAB — CBC WITH DIFFERENTIAL (CANCER CENTER ONLY)
Abs Immature Granulocytes: 0 10*3/uL (ref 0.00–0.07)
Basophils Absolute: 0 10*3/uL (ref 0.0–0.1)
Basophils Relative: 2 %
Eosinophils Absolute: 0.1 10*3/uL (ref 0.0–0.5)
Eosinophils Relative: 3 %
HCT: 41.7 % (ref 39.0–52.0)
Hemoglobin: 13.8 g/dL (ref 13.0–17.0)
Immature Granulocytes: 0 %
Lymphocytes Relative: 46 %
Lymphs Abs: 1.3 10*3/uL (ref 0.7–4.0)
MCH: 27.7 pg (ref 26.0–34.0)
MCHC: 33.1 g/dL (ref 30.0–36.0)
MCV: 83.6 fL (ref 80.0–100.0)
Monocytes Absolute: 0.4 10*3/uL (ref 0.1–1.0)
Monocytes Relative: 13 %
Neutro Abs: 1 10*3/uL — ABNORMAL LOW (ref 1.7–7.7)
Neutrophils Relative %: 36 %
Platelet Count: 165 10*3/uL (ref 150–400)
RBC: 4.99 MIL/uL (ref 4.22–5.81)
RDW: 16.2 % — ABNORMAL HIGH (ref 11.5–15.5)
WBC Count: 2.7 10*3/uL — ABNORMAL LOW (ref 4.0–10.5)
nRBC: 0 % (ref 0.0–0.2)

## 2023-07-23 LAB — LACTATE DEHYDROGENASE: LDH: 219 U/L — ABNORMAL HIGH (ref 98–192)

## 2023-07-23 NOTE — Progress Notes (Signed)
 [facility] [phone]   [phone]  PROGRESS NOTE  Patient Care Team: [name], MD as PCP - General (Family Medicine)  Hematological/Oncological History # CD30-Positive T-cell Lymphoma, Stage III/IV 07/09/2020: patient underwent cholecystectomy. Liver wedge biopsy during the procedure revealed involvement of a CD30-positive T-cell lymphoproliferative disorder 07/25/2020: establish care with Dr. Federico  08/08/2020: PET CT scan showed innumerable hypermetabolic lesions (primarily Deauville 4) in the liver and skeleton. Left neck adenopathy including a dominant level IB lymph node which is Deauville 5. 08/15/2020: Cycle 1 Day 1 of BV-CHP  09/04/2020: Cycle 2 Day 1 of BV-CHP 09/27/2020:  Cycle 3 Day 1 of BV-CHP 10/18/2020: Cycle 4 Day 1 of BV-CHP 11/08/2020: Cycle 5 Day 1 of BV-CHP 11/29/2020: Cycle 6 Day 1 of BV-CHP 12/26/2020: PET CT scan showed further improvement compared to the 11/05/2020 exam, with mild reduction in activity in the extensive scattered mixed density skeletal lesions (although still Deauville 4) and no substantial hypermetabolic adenopathy currently identified 02/11/2021: Bone marrow biopsy performed due to concern for FDG avidity of bone marrow.  No evidence of residual disease noted on bone marrow biopsy.  Interval History:  Maurice MESTRE Sr. 74 y.o. male with medical history significant for T cell lymphoma (Stage III/IV) presents for a follow up visit. The patient's last visit was on 01/23/2023.  In the interim since that time he has had no major changes in his health.  On exam today Maurice Fox reports he has been well overall in the interim since our last visit.  He reports that he unfortunately is getting over a pneumonia.  He has been taking NyQuil and does have a cough.  He has been on antibiotic therapy.  He reports is not having any bumps or lumps concerning for lymphadenopathy.  He reports his appetite has been suppressed  and he has been losing weight.  He is down to about 242 pounds, 7 pounds down from his last weigh-in in April.  He reports that he is also been having some issues with congestion.  He notes he does feel like he is getting better from his cold and pneumonia.  He otherwise denies any fevers, chills, sweats.  Full 10 point ROS is otherwise negative.  MEDICAL HISTORY:  Past Medical History:  Diagnosis Date   Arthritis    Cancer (HCC)    Hyperlipidemia    Hypertension    followed by pcp  (11-23-2019  per pt had stress test greater than 20 yrs ago, told ok)   Nocturia    OSA on CPAP    per pt uses nightly   Phimosis    Sleep apnea    Type 2 diabetes mellitus (HCC)    followed by pcp   (11-23-2019 per pt does not check blood sugar at home)   Wears glasses     SURGICAL HISTORY: Past Surgical History:  Procedure Laterality Date   APPENDECTOMY  child   CHOLECYSTECTOMY N/A 07/09/2020   Procedure: LAPAROSCOPIC CHOLECYSTECTOMY WITH INTRAOPERATIVE CHOLANGIOGRAM,;  Surgeon: Tanda Locus, MD;  Location: [facility];  Service: General;  Laterality: N/A;   CIRCUMCISION N/A 11/29/2019   Procedure: CIRCUMCISION ADULT;  Surgeon: Rosalind Zachary NOVAK, MD;  Location: [facility];  Service: Urology;  Laterality: N/A;   IR IMAGING GUIDED PORT INSERTION  08/06/2020   IR REMOVAL TUN ACCESS W/ PORT W/O FL MOD SED  04/09/2021   LIVER BIOPSY N/A 07/09/2020   Procedure: LAP LIVER BIOPSY;  Surgeon: Tanda Locus, MD;  Location: [facility];  Service: General;  Laterality: N/A;   ROTATOR CUFF REPAIR Left chld   SUPRA-UMBILICAL HERNIA N/A 07/09/2020   Procedure: PRIMARY REPAIR SUPRA-UMBILICAL HERNIA;  Surgeon: Tanda Locus, MD;  Location: [facility];  Service: General;  Laterality: N/A;    SOCIAL HISTORY: Social History   Socioeconomic History   Marital status: Divorced    Spouse name: Not on file   Number of children: Not on file   Years of education: Not on file   Highest education level: Not on file   Occupational History   Not on file  Tobacco Use   Smoking status: Every Day    Current packs/day: 1.00    Average packs/day: 1 pack/day for 49.0 years (49.0 ttl pk-yrs)    Types: Cigars, Cigarettes   Smokeless tobacco: Never  Vaping Use   Vaping status: Never Used  Substance and Sexual Activity   Alcohol  use: Not Currently   Drug use: Never   Sexual activity: Not on file  Other Topics Concern   Not on file  Social History Narrative   Not on file   Social Drivers of Health   Financial Resource Strain: Not on file  Food Insecurity: Not on file  Transportation Needs: Not on file  Physical Activity: Not on file  Stress: Not on file  Social Connections: Unknown (06/03/2021)   Received from [facility]   Social Network    Social Network: Not on file  Intimate Partner Violence: Unknown (04/25/2021)   Received from [facility]   HITS    Physically Hurt: Not on file    Insult or Talk Down To: Not on file    Threaten Physical Harm: Not on file    Scream or Curse: Not on file    FAMILY HISTORY: Family History  Problem Relation Age of Onset   Hypotension Mother    Diabetes Mellitus II Father    Colon cancer Sister    Prostate cancer Brother    Prostate cancer Brother     ALLERGIES:  has no known allergies.  MEDICATIONS:  Current Outpatient Medications  Medication Sig Dispense Refill   amLODipine  (NORVASC ) 2.5 MG tablet Take 2.5 mg by mouth daily.     amoxicillin -clavulanate (AUGMENTIN ) 875-125 MG tablet Take 1 tablet by mouth every 12 (twelve) hours. 14 tablet 0   aspirin 81 MG EC tablet 1 tablet     azelastine (ASTELIN) 0.1 % nasal spray      azithromycin  (ZITHROMAX ) 250 MG tablet Take 1 tablet (250 mg total) by mouth daily. 4 tablet 0   benzonatate (TESSALON) 200 MG capsule Take 200 mg by mouth 3 (three) times daily as needed.     buPROPion (WELLBUTRIN XL) 150 MG 24 hr tablet Take 150 mg by mouth every morning.     clopidogrel (PLAVIX) 75 MG tablet Take 75 mg by  mouth daily.     Continuous Glucose Sensor (FREESTYLE LIBRE 3 SENSOR) MISC USE AS DIRECTED. CHANGE EVERY 14 DAYS     fluticasone  (FLONASE ) 50 MCG/ACT nasal spray Place 2 sprays into both nostrils in the morning and at bedtime.     LANTUS SOLOSTAR 100 UNIT/ML Solostar Pen Inject 40 Units into the skin daily.     metFORMIN (GLUCOPHAGE) 1000 MG tablet Take 1,000 mg by mouth 2 (two) times daily.     metoprolol  tartrate (LOPRESSOR ) 50 MG tablet Take one (1) tablet by mouth ( 50 mg) 2 hours prior to CT scan. 1 tablet 0   Multiple  Vitamins-Minerals (CENTRUM SILVER PO) Take 1 tablet by mouth at bedtime.     olopatadine (PATANOL) 0.1 % ophthalmic solution Apply to eye.     pantoprazole  (PROTONIX ) 40 MG tablet TAKE 1 TABLET(40 MG) BY MOUTH TWICE DAILY 180 tablet 0   predniSONE  (DELTASONE ) 20 MG tablet Take 20 mg by mouth 2 (two) times daily.     rosuvastatin  (CRESTOR ) 20 MG tablet Take 1 tablet (20 mg total) by mouth at bedtime. 30 tablet 11   rosuvastatin  (CRESTOR ) 40 MG tablet Take 40 mg by mouth at bedtime.     RYBELSUS 7 MG TABS      tamsulosin  (FLOMAX ) 0.4 MG CAPS capsule Take 0.4 mg by mouth daily.     valsartan-hydrochlorothiazide (DIOVAN-HCT) 320-25 MG tablet Take 1 tablet by mouth daily.     No current facility-administered medications for this visit.    REVIEW OF SYSTEMS:   Constitutional: ( - ) fevers, ( - )  chills , ( - ) night sweats Eyes: ( - ) blurriness of vision, ( - ) double vision, ( - ) watery eyes Ears, nose, mouth, throat, and face: ( - ) mucositis, ( - ) sore throat Respiratory: ( - ) cough, ( - ) dyspnea, ( - ) wheezes Cardiovascular: ( - ) palpitation, ( - ) chest discomfort, ( - ) lower extremity swelling Gastrointestinal:  ( - ) nausea, ( - ) heartburn, ( - ) change in bowel habits Skin: ( - ) abnormal skin rashes Lymphatics: ( - ) new lymphadenopathy, ( - ) easy bruising Neurological: ( - ) numbness, ( - ) tingling, ( - ) new weaknesses Behavioral/Psych: ( - ) mood  change, ( - ) new changes  All other systems were reviewed with the patient and are negative.  PHYSICAL EXAMINATION: ECOG PERFORMANCE STATUS: 1 - Symptomatic but completely ambulatory  Vitals:   07/23/23 1022  BP: 123/75  Pulse: 94  Resp: 19  Temp: (!) 97.4 F (36.3 C)  SpO2: 99%    Filed Weights   07/23/23 1022  Weight: 242 lb 8 oz (110 kg)     GENERAL: Well-appearing elderly African-American male, alert, no distress and comfortable SKIN: skin color, texture, turgor are normal, no rashes or significant lesions EYES: conjunctiva are pink and non-injected, sclera clear NECK: supple, non-tender LUNGS: clear to auscultation and percussion with normal breathing effort HEART: regular rate & rhythm and no murmurs and no lower extremity edema PSYCH: alert & oriented x 3, fluent speech NEURO: no focal motor/sensory deficits  LABORATORY DATA:  I have reviewed the data as listed    Latest Ref Rng & Units 07/23/2023    9:30 AM 06/25/2023   11:55 AM 05/22/2023    9:36 AM  CBC  WBC 4.0 - 10.5 K/uL 2.7  8.5  7.3   Hemoglobin 13.0 - 17.0 g/dL 86.1  86.4  85.9   Hematocrit 39.0 - 52.0 % 41.7  40.8  41.4   Platelets 150 - 400 K/uL 165  212  208        Latest Ref Rng & Units 07/23/2023    9:30 AM 06/25/2023   11:55 AM 05/22/2023    9:36 AM  CMP  Glucose 70 - 99 mg/dL 851  663  820   BUN 8 - 23 mg/dL 13  34  30   Creatinine 0.61 - 1.24 mg/dL 8.74  8.26  8.64   Sodium 135 - 145 mmol/L 138  133  139   Potassium 3.5 -  5.1 mmol/L 4.1  3.8  3.8   Chloride 98 - 111 mmol/L 106  97  107   CO2 22 - 32 mmol/L 26  19  25    Calcium  8.9 - 10.3 mg/dL 9.1  9.2  9.2   Total Protein 6.5 - 8.1 g/dL 7.1  7.5  7.1   Total Bilirubin 0.0 - 1.2 mg/dL 0.4  0.4  0.4   Alkaline Phos 38 - 126 U/L 85  88  90   AST 15 - 41 U/L 51  17  17   ALT 0 - 44 U/L 54  15  19    RADIOGRAPHIC STUDIES: No images were reviewed during this visit.   ASSESSMENT & PLAN Maurice Fox is a 73 y.o. male with medical history  significant for T cell lymphoma (Stage III/IV) presents for a follow up visit.  After review of the labs, review of the records, and discussion with the patient the patients findings are most consistent with a peripheral CD30 positive T-cell lymphoma.  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  The patient has been staged with a PET CT scan which showed clear involvement of the skeleton and liver, most consistent with stage IV disease.  Given these findings we will plan for 6 cycles of BV CHP chemotherapy.  The regimen of BV CHP consists of brentuximab 1.8 mg/kg IV on day 1, cyclophosphamide  750 mg per metered squared on day 1, doxorubicin  50 mg per metered squared on day 1, and prednisone  60 mg p.o. day 1 through 5.  This is to be continued every 21 days with a plan for up to 6 cycles.  After the third or fourth cycle we will consider restaging PET CT scan in order to evaluate for response.  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 --Received 6 cycles of BV-CHP from 08/15/2020-11/29/2020.  --PET CT scan performed on 11/05/2020, findings show Deauville 2 disease with some Deauville 4 noted in the skeleton. Post treatment PET after the last cycle showed diminished FDG avidity, but still Deauville 4.  -- Bone marrow biopsy on 02/11/2021 shows no evidence of residual disease. -- Per NCCN recommendations we will have the patient return to clinic every 3 to 6 months with imaging on a 51-month basis for the first 2 years PLAN: --last CT scan in Dec 2024 showed no evidence of residual/recurrent disease. Next due Dec 2025.  --White blood cell 2.7, hemoglobin 13.8, MCV 83.6, platelets 165  --Return to clinic in  6 months (around Dec 2025) with imaging   #Dark Tarry Stools # Hemoglobin  Drop  --Underwent EGD and colonoscopy on 03/17/2023. Colonoscopy found several polyps in the ascending colon, transverse colon, descending colon and rectum. In addition there were non-bleeding internal hemorrhoids. EGD showed esophgeal mucosal variant, small hiatal hernia and gastritis with hemorrhage, dudenitis.  -- Responded well to IV iron  therapy, hemoglobin normalized.  #Erectile Dysfunction -- Patient notes Viagra is not working for him.  We provided prescription for Cialis .  If this remains ineffective would make referral to urology.  #Supportive Care -- port removed 04/09/2021  Orders Placed This Encounter  Procedures   CT CHEST ABDOMEN PELVIS W CONTRAST    Standing Status:   Future    Expected Date:   01/18/2024    Expiration Date:   07/22/2024  If indicated for the ordered procedure, I authorize the administration of contrast media per Radiology protocol:   Yes    Does the patient have a contrast media/X-ray dye allergy?:   No    Preferred imaging location?:   [facility]    Release to patient:   Immediate    If indicated for the ordered procedure, I authorize the administration of oral contrast media per Radiology protocol:   Yes   All questions were answered. The patient knows to call the clinic with any problems, questions or concerns.  A total of more than 30 minutes were spent on this encounter with face-to-face time and non-face-to-face time, including preparing to see the patient, ordering medications, counseling the patient and coordination of care as outlined above.   Norleen IVAR Kidney, MD Department of Hematology/Oncology [facility] Phone: [phone] Pager: [phone] Email: [email or web address]    07/23/2023 5:01 PM

## 2023-07-27 ENCOUNTER — Telehealth: Payer: Self-pay | Admitting: Internal Medicine

## 2023-07-27 ENCOUNTER — Encounter: Payer: Self-pay | Admitting: Urology

## 2023-07-27 NOTE — Telephone Encounter (Signed)
 Scheduled appointments per los. Talked with the patient and he is aware of the made appointments.

## 2023-08-05 DIAGNOSIS — J189 Pneumonia, unspecified organism: Secondary | ICD-10-CM | POA: Diagnosis not present

## 2023-08-05 DIAGNOSIS — R972 Elevated prostate specific antigen [PSA]: Secondary | ICD-10-CM | POA: Diagnosis not present

## 2023-08-12 ENCOUNTER — Ambulatory Visit
Admission: RE | Admit: 2023-08-12 | Discharge: 2023-08-12 | Disposition: A | Discharge status: Home / Self Care | Payer: Medicare (Managed Care) | Source: Ambulatory Visit | Attending: Urology

## 2023-08-12 DIAGNOSIS — N400 Enlarged prostate without lower urinary tract symptoms: Secondary | ICD-10-CM | POA: Diagnosis not present

## 2023-08-12 DIAGNOSIS — R972 Elevated prostate specific antigen [PSA]: Secondary | ICD-10-CM

## 2023-08-12 MED ORDER — GADOPICLENOL 0.5 MMOL/ML IV SOLN
10.0000 mL | Freq: Once | INTRAVENOUS | Status: AC | PRN
  Administered 2023-08-12: 10 mL via INTRAVENOUS

## 2023-08-19 ENCOUNTER — Encounter (HOSPITAL_BASED_OUTPATIENT_CLINIC_OR_DEPARTMENT_OTHER): Payer: Self-pay | Admitting: *Deleted

## 2023-08-19 ENCOUNTER — Encounter: Payer: Self-pay | Admitting: Hematology and Oncology

## 2023-08-19 DIAGNOSIS — R739 Hyperglycemia, unspecified: Secondary | ICD-10-CM | POA: Diagnosis not present

## 2023-08-19 NOTE — Telephone Encounter (Signed)
 S/w pt is aware appointment was moved to the day after PET test. Pt is agreeable to plan.

## 2023-08-19 NOTE — Progress Notes (Deleted)
 Cardiology Office Note:  .   Date:  08/19/2023 ID:  Maurice VEAR Schaffer Fox., DOB August 03, 1950, MRN [id number] PCP: [name], MD [facility] Providers Cardiologist:  None   Patient Profile: .      PMH Aortic atherosclerosis Coronary artery calcification noted on CT Hypertension Hyperlipidemia Diabetes OSA on CPAP Tobacco abuse 53 pack year history  Referred to cardiology and seen by me on 05/13/2023 as a new patient for coronary calcification noted on abdominal CT. Is largely sedentary, spending most of his day at home on the computer, watching TV, or sleeping. 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 Lives alone and does his own cooking, primarily frying fish or chicken tenders. He admits to limited intake of fruits and vegetables and often snacks on chips and salsa. He has a history of smoking, currently about a pack a day, since the age of 89. No chest pain but does experience shortness of breath at times when walking. He uses a CPAP machine for sleep and reports no issues with breathing at night. He is on insulin  for diabetes and reports BP is generally well controlled. Had lab work with PCP the day prior.  Family history is significant for colon cancer, prostate cancer, and diabetes, but no significant coronary disease.  ASCVD risk score elevated at 50.9%       History of Present Illness: .     History of Present Illness Maurice Fox. is a very pleasant 73 y.o. male  who is here today for new patient consult for coronary calcification noted on abdominal CT.    No results found for: LIPOA   ROS: See HPI       Studies Reviewed: .          Risk Assessment/Calculations:     No BP recorded.  {Refresh Note OR Click here to enter BP  :1}***       Physical Exam:   VS: There were no vitals taken for this visit.  Wt Readings from Last 3 Encounters:  07/23/23 242 lb 8 oz (110 kg)  06/25/23 245 lb (111.1 kg)  05/19/23 249 lb  (112.9 kg)     GEN: obese, well developed in no acute distress NECK: No JVD; No carotid bruits CARDIAC: RRR, no murmurs, rubs, gallops RESPIRATORY:  Clear to auscultation without rales, wheezing or rhonchi  ABDOMEN: Soft, non-tender, non-distended EXTREMITIES:  No edema; No deformity     ASSESSMENT AND PLAN: .    Assessment & Plan Coronary artery disease  Coronary calcification was noted on an abdominal CT. 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  ASCVD risk score is 50%.  Risk factors include age, race, diabetes, smoking, and hypertension. He denies chest pain. He notes DOE at times when he is walking. We will get coronary CTA for ischemia evaluation. Will have him take Lopressor  50 mg prior to CT.  Encouraged significant lifestyle modification to focus on eating a heart healthy mostly plant based diet avoiding saturated fat, processed foods, simple carbohydrates, and sugar along with aiming for at least 150 minutes of moderate intensity exercise each week. Consider getting a gym membership.  No bleeding problems.  Continue aspirin, Diovan HCT, rosuvastatin , amlodipine .  Abnormal EKG    EKG  today reveals NSR at 90 bpm, RBBB, inferior infarct age undetermined.  He denies history of significant chest  pain, dyspnea, or other symptoms concerning for MI.  We are getting coronary CTA for evaluation of ischemia.  ER precautions advised.  Nicotine dependence   He has been smoking approximately one pack per day since age 50. Smoking cessation is critical to reduce cardiovascular risk. Complete cessation advised.   Hypertension BP is well controlled.  Renal function stable on labs completed 05/12/2023.  No changes in antihypertensive therapy today.  Hyperlipidemia LDL goal < 70 Lipid profile completed 05/12/2023 revealed total cholesterol 138, HDL 49, triglycerides 58, LDL-C 76. Has been on rosuvastatin  10 mg daily  for some time, advised that he increase rosuvastatin  to 20 mg daily. Plan to get NMR, ALT and LP(a) in 2-3 months.   Type 2 diabetes mellitus   A1c 9.0% on 05/12/23. Dietary habits may contribute to poor glycemic control. Encouraged dietary changes to include more fruits, vegetables, and whole grains. Recommend healthy cooking classes to improve dietary habits. Management per PCP.    Plan/Goals:         Disposition:   Signed, Rosaline Bane, NP-C

## 2023-08-20 ENCOUNTER — Ambulatory Visit (HOSPITAL_BASED_OUTPATIENT_CLINIC_OR_DEPARTMENT_OTHER): Payer: Medicare (Managed Care) | Admitting: Nurse Practitioner

## 2023-08-24 DIAGNOSIS — J129 Viral pneumonia, unspecified: Secondary | ICD-10-CM | POA: Diagnosis not present

## 2023-08-25 DIAGNOSIS — J449 Chronic obstructive pulmonary disease, unspecified: Secondary | ICD-10-CM | POA: Diagnosis not present

## 2023-08-25 DIAGNOSIS — Z720 Tobacco use: Secondary | ICD-10-CM | POA: Diagnosis not present

## 2023-08-25 DIAGNOSIS — I10 Essential (primary) hypertension: Secondary | ICD-10-CM | POA: Diagnosis not present

## 2023-08-25 DIAGNOSIS — E1165 Type 2 diabetes mellitus with hyperglycemia: Secondary | ICD-10-CM | POA: Diagnosis not present

## 2023-08-25 DIAGNOSIS — C8590 Non-Hodgkin lymphoma, unspecified, unspecified site: Secondary | ICD-10-CM | POA: Diagnosis not present

## 2023-08-25 DIAGNOSIS — R7989 Other specified abnormal findings of blood chemistry: Secondary | ICD-10-CM | POA: Diagnosis not present

## 2023-08-27 ENCOUNTER — Other Ambulatory Visit (HOSPITAL_COMMUNITY): Payer: Self-pay | Admitting: *Deleted

## 2023-08-27 ENCOUNTER — Encounter (HOSPITAL_COMMUNITY): Payer: Self-pay

## 2023-08-27 DIAGNOSIS — I25118 Atherosclerotic heart disease of native coronary artery with other forms of angina pectoris: Secondary | ICD-10-CM

## 2023-08-27 DIAGNOSIS — R931 Abnormal findings on diagnostic imaging of heart and coronary circulation: Secondary | ICD-10-CM

## 2023-08-27 DIAGNOSIS — R0609 Other forms of dyspnea: Secondary | ICD-10-CM

## 2023-09-01 ENCOUNTER — Ambulatory Visit
Admission: RE | Admit: 2023-09-01 | Discharge: 2023-09-01 | Disposition: A | Discharge status: Home / Self Care | Source: Ambulatory Visit | Attending: Nurse Practitioner | Admitting: Nurse Practitioner

## 2023-09-01 ENCOUNTER — Ambulatory Visit

## 2023-09-01 DIAGNOSIS — I2510 Atherosclerotic heart disease of native coronary artery without angina pectoris: Secondary | ICD-10-CM | POA: Insufficient documentation

## 2023-09-01 DIAGNOSIS — R931 Abnormal findings on diagnostic imaging of heart and coronary circulation: Secondary | ICD-10-CM | POA: Diagnosis not present

## 2023-09-01 DIAGNOSIS — R0609 Other forms of dyspnea: Secondary | ICD-10-CM | POA: Diagnosis not present

## 2023-09-01 DIAGNOSIS — I25118 Atherosclerotic heart disease of native coronary artery with other forms of angina pectoris: Secondary | ICD-10-CM | POA: Diagnosis not present

## 2023-09-01 LAB — NM PET CT CARDIAC PERFUSION MULTI W/ABSOLUTE BLOODFLOW
LV dias vol: 51 mL (ref 62–150)
MBFR: 2.12
Nuc Rest EF: 59 %
Nuc Stress EF: 66 %
Peak HR: 89 {beats}/min
Rest HR: 66 {beats}/min
Rest MBF: 0.67 ml/g/min
Rest Nuclear Isotope Dose: 23.1 mCi
SRS: 0
SSS: 8
ST Depression (mm): 0 mm
Stress MBF: 1.42 ml/g/min
Stress Nuclear Isotope Dose: 23.3 mCi
TID: 1.04

## 2023-09-01 MED ORDER — REGADENOSON 0.4 MG/5ML IV SOLN
0.4000 mg | Freq: Once | INTRAVENOUS | Status: AC
  Administered 2023-09-01 (×2): 0.4 mg via INTRAVENOUS
  Filled 2023-09-01: qty 5

## 2023-09-01 MED ORDER — RUBIDIUM RB82 GENERATOR (RUBYFILL)
25.0000 | PACK | Freq: Once | INTRAVENOUS | Status: AC
  Administered 2023-09-01 (×2): 23.25 via INTRAVENOUS

## 2023-09-01 MED ORDER — REGADENOSON 0.4 MG/5ML IV SOLN
INTRAVENOUS | Status: AC
  Filled 2023-09-01: qty 5

## 2023-09-01 MED ORDER — RUBIDIUM RB82 GENERATOR (RUBYFILL)
25.0000 | PACK | Freq: Once | INTRAVENOUS | Status: AC
  Administered 2023-09-01 (×2): 23.05 via INTRAVENOUS

## 2023-09-01 NOTE — Progress Notes (Signed)
 Pt tolerated lexiscan . Some stomach discomfort, resolved at end of exam. PIV removed and PO caffeine given.

## 2023-09-02 ENCOUNTER — Ambulatory Visit: Payer: Self-pay | Admitting: Nurse Practitioner

## 2023-09-02 ENCOUNTER — Ambulatory Visit (HOSPITAL_COMMUNITY): Admission: RE | Admit: 2023-09-02 | Payer: Medicare (Managed Care) | Source: Ambulatory Visit

## 2023-09-02 NOTE — Progress Notes (Signed)
 Cardiology Office Note:  .   Date:  09/03/2023 ID:  Maurice VEAR Schaffer Sr., DOB 02/09/1950, MRN [id number] PCP: [name], MD [facility] Providers Cardiologist:  None   Patient Profile: .      PMH Aortic atherosclerosis Coronary artery calcification noted on CT Hypertension Hyperlipidemia Diabetes OSA on CPAP Tobacco abuse 53 pack year history  Referred to cardiology and seen by me on 05/13/2023 as a new patient for coronary calcification noted on abdominal CT. Is largely sedentary, spending most of his day at home on the computer, watching TV, or sleeping. 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 Lives alone and does his own cooking, primarily frying fish or chicken tenders. He admits to limited intake of fruits and vegetables and often snacks on chips and salsa. He has a history of smoking, currently about a pack a day, since the age of 39. No chest pain but does experience shortness of breath at times when walking. He uses a CPAP machine for sleep and reports no issues with breathing at night. He is on insulin  for diabetes and reports BP is generally well controlled. Had lab work with PCP the day prior.  Family history is significant for colon cancer, prostate cancer, and diabetes, but no significant coronary disease.  ASCVD risk score elevated at 50.9%       History of Present Illness: .     History of Present Illness Maurice TRUMBULL Sr. is a very pleasant 73 y.o. male who is here today for follow-up of CAD. His son Caron is listening on the phone. He reports he is overall feeling well.  He is noting pain under his right breast that he describes as stabbing. Pain is occurring at rest. Admits he is not very active but has not noticed increased pain with exertion. We reviewed results of cardiac PET CT which was low risk with no evidence of ischemia. He experiences shortness of breath, but no orthopnea or PND. He continues to smoke  approximately ten cigars daily. PCP, Dr. Waylan, changed him from aspirin to Plavix and increased his rosuvastatin  to 40 mg daily recently. No bleeding issues or melena. He has experienced unintentional weight loss due to decreased appetite. His diet mainly consists of fried seafood, with occasional fruits and vegetables. His ability to cook has been affected by the demolition of his house. He denies palpitations, presyncope, syncope.    No results found for: LIPOA   ROS: See HPI       Studies Reviewed: .          Risk Assessment/Calculations:             Physical Exam:   VS: BP 114/60 (BP Location: Left Arm, Patient Position: Sitting, Cuff Size: Large)   Pulse 96   Ht 5' 10 (1.778 m)   Wt 242 lb 9.6 oz (110 kg)   SpO2 97%   BMI 34.81 kg/m   Wt Readings from Last 3 Encounters:  09/03/23 242 lb 9.6 oz (110 kg)  07/23/23 242 lb 8 oz (110 kg)  06/25/23 245 lb (111.1 kg)     GEN: obese, well developed in no acute distress NECK: No JVD; No carotid bruits CARDIAC: RRR, no murmurs, rubs, gallops RESPIRATORY:  Clear to auscultation without rales, wheezing or rhonchi  ABDOMEN: Soft, non-tender, non-distended EXTREMITIES:  No edema; No deformity     ASSESSMENT AND PLAN: .    Assessment & Plan Coronary artery  disease Aortic atherosclerosis  Shortness of breath CT calcium  score of 3058 (95th percentile) with significant calcification across all 4 major coronary arteries.  Cardiac PET/CT completed 09/01/2023 with no evidence of ischemia or infarction, low risk, global MBF normal. He has some shortness of breath that he feels is stable. No orthopnea, PND or edema. Admits to sedentary lifestyle but SOB does not worsen with exertion. Right sided chest pain under right breast not concerning for angina. No indication for further ischemic evaluation at this time. ASA was changed to clopidogrel by PCP, no bleeding concerns. BP is well controlled.  He has a gym in his apartment complex that he  can use.  - Check lipids today for surveillance on higher dose rosuvastatin  - Aim to increase physical activity with goal of at least 150 minutes of moderate intensity exercise each week - Encouraged heart healthy, mostly whole food diet limiting saturated fat, processed foods, simple carbohydrates, sugar - Continue clopidogrel, Diovan HCT, rosuvastatin , amlodipine   Nicotine dependence   He has been smoking for many years. His son does not think he is willing to quit. Smoking cessation is critical to reduce cardiovascular risk.  -Complete cessation advised  Hypertension BP is well controlled.  Renal function stable on labs completed 08/19/23.  No changes in antihypertensive therapy today. - Management per PCP  Hyperlipidemia LDL goal < 70 Lipid profile completed 05/12/2023 revealed total cholesterol 138, HDL 49, triglycerides 58, LDL-C 76. PCP increased rosuvastatin  to 40 mg daily. He denies concerning side effects. -We will get NMR, ALT and LP(a) today for surveillance  Type 2 diabetes mellitus Poor appetite   Slight improvement in A1c to 8.6% on 08/19/2023 down from A1c 9.0% on 05/12/23. He admits to poor appetite. Dietary habits may contribute to poor glycemic control.  -Encouraged dietary changes to include more fruits, vegetables, and whole grains -Avoid prolonged periods of fasting -Recommend regular physical activity to potentially improve glucose control       Disposition: 6 months with Dr. Lonni or APP  Signed, Rosaline Bane, NP-C

## 2023-09-03 ENCOUNTER — Encounter (HOSPITAL_BASED_OUTPATIENT_CLINIC_OR_DEPARTMENT_OTHER): Payer: Self-pay | Admitting: Nurse Practitioner

## 2023-09-03 ENCOUNTER — Ambulatory Visit (INDEPENDENT_AMBULATORY_CARE_PROVIDER_SITE_OTHER): Admitting: Nurse Practitioner

## 2023-09-03 VITALS — BP 114/60 | HR 96 | Ht 70.0 in | Wt 242.6 lb

## 2023-09-03 DIAGNOSIS — F17210 Nicotine dependence, cigarettes, uncomplicated: Secondary | ICD-10-CM | POA: Diagnosis not present

## 2023-09-03 DIAGNOSIS — R931 Abnormal findings on diagnostic imaging of heart and coronary circulation: Secondary | ICD-10-CM | POA: Diagnosis not present

## 2023-09-03 DIAGNOSIS — Z794 Long term (current) use of insulin: Secondary | ICD-10-CM

## 2023-09-03 DIAGNOSIS — R0609 Other forms of dyspnea: Secondary | ICD-10-CM

## 2023-09-03 DIAGNOSIS — I700 Atherosclerosis of aorta: Secondary | ICD-10-CM | POA: Diagnosis not present

## 2023-09-03 DIAGNOSIS — E1165 Type 2 diabetes mellitus with hyperglycemia: Secondary | ICD-10-CM | POA: Diagnosis not present

## 2023-09-03 DIAGNOSIS — R630 Anorexia: Secondary | ICD-10-CM

## 2023-09-03 DIAGNOSIS — I25118 Atherosclerotic heart disease of native coronary artery with other forms of angina pectoris: Secondary | ICD-10-CM

## 2023-09-03 DIAGNOSIS — E785 Hyperlipidemia, unspecified: Secondary | ICD-10-CM | POA: Diagnosis not present

## 2023-09-03 NOTE — Patient Instructions (Signed)
 Medication Instructions:   Your physician recommends that you continue on your current medications as directed. Please refer to the Current Medication list given to you today.   *If you need a refill on your cardiac medications before your next appointment, please call your pharmacy*  Lab Work:  TODAY!!!!! NMR/LPA/ALT  If you have labs (blood work) drawn today and your tests are completely normal, you will receive your results only by: MyChart Message (if you have MyChart) OR A paper copy in the mail If you have any lab test that is abnormal or we need to change your treatment, we will call you to review the results.  Testing/Procedures:  None ordered.  Follow-Up: At [facility], you and your health needs are our priority.  As part of our continuing mission to provide you with exceptional heart care, our providers are all part of one team.  This team includes your primary Cardiologist (physician) and Advanced Practice Providers or APPs (Physician Assistants and Nurse Practitioners) who all work together to provide you with the care you need, when you need it.  Your next appointment:   6 month(s)  Provider:   Shelda Bruckner, MD, Rosaline Bane, NP, or Reche Finder, NP    We recommend signing up for the patient portal called MyChart.  Sign up information is provided on this After Visit Summary.  MyChart is used to connect with patients for Virtual Visits (Telemedicine).  Patients are able to view lab/test results, encounter notes, upcoming appointments, etc.  Non-urgent messages can be sent to your provider as well.   To learn more about what you can do with MyChart, go to [email or web address].   Other Instructions  Your physician wants you to follow-up in: 6 months.  You will receive a reminder letter in the mail two months in advance. If you don't receive a letter, please call our office to schedule the follow-up appointment.

## 2023-09-04 LAB — NMR, LIPOPROFILE
Cholesterol, Total: 161 mg/dL (ref 100–199)
HDL Particle Number: 29.5 umol/L — ABNORMAL LOW
HDL-C: 63 mg/dL
LDL Particle Number: 880 nmol/L
LDL Size: 20.4 nm — ABNORMAL LOW
LDL-C (NIH Calc): 83 mg/dL (ref 0–99)
LP-IR Score: <25
Small LDL Particle Number: 368 nmol/L
Triglycerides: 81 mg/dL (ref 0–149)

## 2023-09-04 LAB — ALT: ALT: 40 IU/L (ref 0–44)

## 2023-09-04 LAB — LIPOPROTEIN A (LPA): Lipoprotein (a): 84.5 nmol/L — ABNORMAL HIGH (ref <75.0)

## 2023-09-08 ENCOUNTER — Ambulatory Visit: Payer: Self-pay | Admitting: Nurse Practitioner

## 2023-09-18 ENCOUNTER — Other Ambulatory Visit (HOSPITAL_BASED_OUTPATIENT_CLINIC_OR_DEPARTMENT_OTHER): Payer: Self-pay

## 2023-09-19 ENCOUNTER — Other Ambulatory Visit (HOSPITAL_BASED_OUTPATIENT_CLINIC_OR_DEPARTMENT_OTHER): Payer: Self-pay

## 2023-09-22 ENCOUNTER — Other Ambulatory Visit (HOSPITAL_BASED_OUTPATIENT_CLINIC_OR_DEPARTMENT_OTHER): Payer: Self-pay

## 2023-09-23 ENCOUNTER — Other Ambulatory Visit (HOSPITAL_BASED_OUTPATIENT_CLINIC_OR_DEPARTMENT_OTHER): Payer: Self-pay

## 2023-09-23 MED ORDER — LANTUS SOLOSTAR 100 UNIT/ML ~~LOC~~ SOPN
40.0000 [IU] | PEN_INJECTOR | Freq: Every day | SUBCUTANEOUS | 2 refills | Status: DC
  Filled 2023-09-23: qty 9, 22d supply, fill #0
  Filled 2023-10-23: qty 9, 22d supply, fill #1

## 2023-09-23 MED ORDER — LEVOFLOXACIN 750 MG PO TABS: ORAL_TABLET | ORAL | 0 refills | Status: DC

## 2023-09-23 MED ORDER — INSULIN PEN NEEDLE 32G X 4 MM MISC
11 refills | Status: AC
  Filled 2023-09-23: qty 100, 90d supply, fill #0

## 2023-09-23 MED ORDER — ROSUVASTATIN CALCIUM 40 MG PO TABS: 40.0000 mg | ORAL_TABLET | Freq: Every day | ORAL | 3 refills | Status: DC

## 2023-09-23 MED ORDER — VALSARTAN-HYDROCHLOROTHIAZIDE 320-25 MG PO TABS: 1.0000 | ORAL_TABLET | Freq: Every day | ORAL | 3 refills | Status: DC

## 2023-09-23 MED ORDER — AMLODIPINE BESYLATE 2.5 MG PO TABS: 2.5000 mg | ORAL_TABLET | Freq: Every day | ORAL | 3 refills | Status: DC

## 2023-09-23 MED ORDER — ROSUVASTATIN CALCIUM 20 MG PO TABS: 20.0000 mg | ORAL_TABLET | Freq: Every day | ORAL | 11 refills | Status: DC

## 2023-09-23 MED ORDER — AZELASTINE HCL 0.1 % NA SOLN: 2.0000 | Freq: Two times a day (BID) | NASAL | 11 refills | Status: DC

## 2023-09-23 MED ORDER — AZELASTINE HCL 0.1 % NA SOLN: 2.0000 | Freq: Two times a day (BID) | NASAL | 11 refills | Status: AC | PRN

## 2023-09-23 MED ORDER — FREESTYLE LIBRE 3 PLUS SENSOR MISC
1 refills | Status: DC
  Filled 2023-09-23: qty 2, 30d supply, fill #0
  Filled 2023-10-26: qty 2, 30d supply, fill #1

## 2023-09-23 MED ORDER — VALSARTAN-HYDROCHLOROTHIAZIDE 320-25 MG PO TABS: 1.0000 | ORAL_TABLET | Freq: Every day | ORAL | 1 refills | Status: DC

## 2023-09-23 MED ORDER — SYNJARDY 12.5-500 MG PO TABS: 1.0000 | ORAL_TABLET | Freq: Two times a day (BID) | ORAL | 3 refills | Status: DC

## 2023-09-23 MED ORDER — CLOPIDOGREL BISULFATE 75 MG PO TABS
75.0000 mg | ORAL_TABLET | Freq: Every day | ORAL | 3 refills | Status: DC
  Filled 2023-12-23: qty 90, 90d supply, fill #0

## 2023-09-23 MED ORDER — AIRSUPRA 90-80 MCG/ACT IN AERO: 2.0000 | INHALATION_SPRAY | RESPIRATORY_TRACT | 3 refills | Status: DC | PRN

## 2023-09-23 MED ORDER — DICLOFENAC SODIUM 1 % EX GEL: 4.0000 g | Freq: Four times a day (QID) | CUTANEOUS | 3 refills | Status: AC

## 2023-09-23 MED ORDER — TRULICITY 0.75 MG/0.5ML ~~LOC~~ SOAJ: 0.7500 mg | SUBCUTANEOUS | 1 refills | Status: AC

## 2023-09-23 MED ORDER — ALBUTEROL SULFATE HFA 108 (90 BASE) MCG/ACT IN AERS: 2.0000 | INHALATION_SPRAY | RESPIRATORY_TRACT | 0 refills | Status: DC | PRN

## 2023-09-23 MED ORDER — ROSUVASTATIN CALCIUM 10 MG PO TABS: 10.0000 mg | ORAL_TABLET | Freq: Every day | ORAL | 3 refills | Status: AC

## 2023-09-23 MED ORDER — AMLODIPINE BESYLATE 2.5 MG PO TABS: 2.5000 mg | ORAL_TABLET | Freq: Every day | ORAL | 1 refills | Status: AC

## 2023-09-24 ENCOUNTER — Other Ambulatory Visit (HOSPITAL_BASED_OUTPATIENT_CLINIC_OR_DEPARTMENT_OTHER): Payer: Self-pay

## 2023-09-29 ENCOUNTER — Other Ambulatory Visit (HOSPITAL_BASED_OUTPATIENT_CLINIC_OR_DEPARTMENT_OTHER): Payer: Self-pay

## 2023-09-29 DIAGNOSIS — M542 Cervicalgia: Secondary | ICD-10-CM | POA: Diagnosis not present

## 2023-09-29 DIAGNOSIS — K219 Gastro-esophageal reflux disease without esophagitis: Secondary | ICD-10-CM | POA: Diagnosis not present

## 2023-09-29 DIAGNOSIS — Z23 Encounter for immunization: Secondary | ICD-10-CM | POA: Diagnosis not present

## 2023-09-29 DIAGNOSIS — E1165 Type 2 diabetes mellitus with hyperglycemia: Secondary | ICD-10-CM | POA: Diagnosis not present

## 2023-09-29 DIAGNOSIS — I10 Essential (primary) hypertension: Secondary | ICD-10-CM | POA: Diagnosis not present

## 2023-09-29 DIAGNOSIS — Z720 Tobacco use: Secondary | ICD-10-CM | POA: Diagnosis not present

## 2023-09-29 MED ORDER — BUPROPION HCL ER (XL) 150 MG PO TB24
150.0000 mg | ORAL_TABLET | Freq: Every morning | ORAL | 3 refills | Status: DC
  Filled 2023-09-29: qty 90, 90d supply, fill #0

## 2023-10-15 DIAGNOSIS — R972 Elevated prostate specific antigen [PSA]: Secondary | ICD-10-CM | POA: Diagnosis not present

## 2023-10-16 DIAGNOSIS — Z1331 Encounter for screening for depression: Secondary | ICD-10-CM | POA: Diagnosis not present

## 2023-10-16 DIAGNOSIS — Z1339 Encounter for screening examination for other mental health and behavioral disorders: Secondary | ICD-10-CM | POA: Diagnosis not present

## 2023-10-16 DIAGNOSIS — Z0000 Encounter for general adult medical examination without abnormal findings: Secondary | ICD-10-CM | POA: Diagnosis not present

## 2023-10-21 ENCOUNTER — Encounter: Payer: Self-pay | Admitting: *Deleted

## 2023-10-21 NOTE — Progress Notes (Signed)
 Maurice DEL Everett Sr.                                          MRN: [id number]   10/21/2023   The [facility]BCI Quality Team Specialist reviewed this patient medical record for the purposes of chart review for care gap closure. The following were reviewed: chart review for care gap closure-kidney health evaluation for diabetes:eGFR  and uACR.    VBCI Quality Team

## 2023-10-26 ENCOUNTER — Other Ambulatory Visit (HOSPITAL_BASED_OUTPATIENT_CLINIC_OR_DEPARTMENT_OTHER): Payer: Self-pay

## 2023-10-28 ENCOUNTER — Other Ambulatory Visit (HOSPITAL_BASED_OUTPATIENT_CLINIC_OR_DEPARTMENT_OTHER): Payer: Self-pay

## 2023-10-29 DIAGNOSIS — C61 Malignant neoplasm of prostate: Secondary | ICD-10-CM | POA: Diagnosis not present

## 2023-10-29 DIAGNOSIS — R399 Unspecified symptoms and signs involving the genitourinary system: Secondary | ICD-10-CM | POA: Diagnosis not present

## 2023-11-08 ENCOUNTER — Encounter (HOSPITAL_BASED_OUTPATIENT_CLINIC_OR_DEPARTMENT_OTHER): Payer: Self-pay | Admitting: Emergency Medicine

## 2023-11-08 ENCOUNTER — Emergency Department (HOSPITAL_BASED_OUTPATIENT_CLINIC_OR_DEPARTMENT_OTHER)
Admission: EM | Admit: 2023-11-08 | Discharge: 2023-11-08 | Disposition: A | Discharge status: Home / Self Care | Attending: Emergency Medicine | Admitting: Emergency Medicine

## 2023-11-08 ENCOUNTER — Telehealth (HOSPITAL_BASED_OUTPATIENT_CLINIC_OR_DEPARTMENT_OTHER): Payer: Self-pay | Admitting: Emergency Medicine

## 2023-11-08 ENCOUNTER — Other Ambulatory Visit: Payer: Self-pay

## 2023-11-08 DIAGNOSIS — Z7901 Long term (current) use of anticoagulants: Secondary | ICD-10-CM | POA: Diagnosis not present

## 2023-11-08 DIAGNOSIS — I10 Essential (primary) hypertension: Secondary | ICD-10-CM | POA: Diagnosis not present

## 2023-11-08 DIAGNOSIS — Z8546 Personal history of malignant neoplasm of prostate: Secondary | ICD-10-CM | POA: Diagnosis not present

## 2023-11-08 DIAGNOSIS — R21 Rash and other nonspecific skin eruption: Secondary | ICD-10-CM | POA: Diagnosis not present

## 2023-11-08 DIAGNOSIS — Z79899 Other long term (current) drug therapy: Secondary | ICD-10-CM | POA: Diagnosis not present

## 2023-11-08 DIAGNOSIS — Z794 Long term (current) use of insulin: Secondary | ICD-10-CM | POA: Insufficient documentation

## 2023-11-08 DIAGNOSIS — Z7984 Long term (current) use of oral hypoglycemic drugs: Secondary | ICD-10-CM | POA: Diagnosis not present

## 2023-11-08 DIAGNOSIS — E119 Type 2 diabetes mellitus without complications: Secondary | ICD-10-CM | POA: Insufficient documentation

## 2023-11-08 LAB — CBC WITH DIFFERENTIAL/PLATELET
Abs Immature Granulocytes: 0.01 K/uL (ref 0.00–0.07)
Basophils Absolute: 0.1 K/uL (ref 0.0–0.1)
Basophils Relative: 1 %
Eosinophils Absolute: 0.3 K/uL (ref 0.0–0.5)
Eosinophils Relative: 6 %
HCT: 38.9 % — ABNORMAL LOW (ref 39.0–52.0)
Hemoglobin: 13 g/dL (ref 13.0–17.0)
Immature Granulocytes: 0 %
Lymphocytes Relative: 29 %
Lymphs Abs: 1.4 K/uL (ref 0.7–4.0)
MCH: 29.4 pg (ref 26.0–34.0)
MCHC: 33.4 g/dL (ref 30.0–36.0)
MCV: 88 fL (ref 80.0–100.0)
Monocytes Absolute: 0.6 K/uL (ref 0.1–1.0)
Monocytes Relative: 11 %
Neutro Abs: 2.6 K/uL (ref 1.7–7.7)
Neutrophils Relative %: 53 %
Platelets: 230 K/uL (ref 150–400)
RBC: 4.42 MIL/uL (ref 4.22–5.81)
RDW: 14.5 % (ref 11.5–15.5)
WBC: 4.9 K/uL (ref 4.0–10.5)
nRBC: 0 % (ref 0.0–0.2)

## 2023-11-08 LAB — COMPREHENSIVE METABOLIC PANEL WITH GFR
ALT: 40 U/L (ref 0–44)
AST: 39 U/L (ref 15–41)
Albumin: 3.9 g/dL (ref 3.5–5.0)
Alkaline Phosphatase: 80 U/L (ref 38–126)
Anion gap: 12 (ref 5–15)
BUN: 21 mg/dL (ref 8–23)
CO2: 24 mmol/L (ref 22–32)
Calcium: 9.7 mg/dL (ref 8.9–10.3)
Chloride: 103 mmol/L (ref 98–111)
Creatinine, Ser: 1.18 mg/dL (ref 0.61–1.24)
GFR, Estimated: >60 mL/min (ref >60)
Glucose, Bld: 126 mg/dL — ABNORMAL HIGH (ref 70–99)
Potassium: 4 mmol/L (ref 3.5–5.1)
Sodium: 139 mmol/L (ref 135–145)
Total Bilirubin: 0.6 mg/dL (ref 0.0–1.2)
Total Protein: 7.3 g/dL (ref 6.5–8.1)

## 2023-11-08 MED ORDER — PREDNISONE 10 MG PO TABS: ORAL_TABLET | ORAL | 0 refills | Status: DC

## 2023-11-08 NOTE — ED Triage Notes (Signed)
 Pt caox4, ambulatory NAD c/o rash and itching on legs bilat x2 days, is now on torso. Pt noticed rash after moving things out of a house that had been sitting for 3 months. No change in toiletries, laundry detergent ect. No hx allergies.

## 2023-11-08 NOTE — Telephone Encounter (Signed)
 Sent to different pharmacy

## 2023-11-08 NOTE — ED Notes (Signed)
 DC paperwork given and verbally understood.

## 2023-11-08 NOTE — ED Provider Notes (Signed)
 [place] EMERGENCY DEPARTMENT AT [facility] Provider Note   CSN: [id number] Arrival date & time: 11/08/23  9255     Patient presents with: Rash   Maurice VEAR Schaffer Sr. is a 73 y.o. male.  {Add pertinent medical, surgical, social history, OB history to HPI:32947} HPI      73yo male with history of hypertension, hyperlipidemia,type II DM, T cell lymphoma, anaplastic large cell lymphoma 2022-2023,   Past Medical History:  Diagnosis Date   Arthritis    Cancer (HCC)    Hyperlipidemia    Hypertension    followed by pcp  (11-23-2019  per pt had stress test greater than 20 yrs ago, told ok)   Nocturia    OSA on CPAP    per pt uses nightly   Phimosis    Sleep apnea    Type 2 diabetes mellitus (HCC)    followed by pcp   (11-23-2019 per pt does not check blood sugar at home)   Wears glasses     Prior to Admission medications   Medication Sig Start Date End Date Taking? Authorizing Provider  albuterol  (VENTOLIN  HFA) 108 (90 Base) MCG/ACT inhaler Inhale 2 puffs into the lungs every 4 (four) hours as needed for shortness of breath or wheezing. 07/21/23   [provider]  albuterol  (VENTOLIN  HFA) 108 (90 Base) MCG/ACT inhaler Inhale 2 puffs into the lungs every 4 (four) hours as needed. Rinse mouth with water after each use. 06/29/23     Albuterol -Budesonide (AIRSUPRA ) 90-80 MCG/ACT AERO Take 2 puffs by mouth every 4 (four) to 6 (six) hours as needed for shortness of breath. Rinse mouth out with water after each use. 06/25/23     amLODipine  (NORVASC ) 2.5 MG tablet Take 2.5 mg by mouth daily. 12/24/22   [provider]  amLODipine  (NORVASC ) 2.5 MG tablet Take 1 tablet (2.5 mg total) by mouth daily. 02/27/23     amLODipine  (NORVASC ) 2.5 MG tablet Take 1 tablet (2.5 mg total) by mouth daily. 12/24/22     amLODipine  (NORVASC ) 2.5 MG tablet Take 1 tablet (2.5 mg total) by mouth daily. 07/21/23     azelastine  (ASTELIN ) 0.1 % nasal spray     [provider]   azelastine  (ASTELIN ) 0.1 % nasal spray Place 2 sprays into both nostrils 2 (two) times daily. 06/18/23     azelastine  (ASTELIN ) 0.1 % nasal spray Place 2 sprays into both nostrils 2 (two) times daily as needed. 02/11/23     benzonatate (TESSALON) 200 MG capsule Take 200 mg by mouth 3 (three) times daily as needed. 06/24/23   [provider]  buPROPion  (WELLBUTRIN  XL) 150 MG 24 hr tablet Take 150 mg by mouth every morning. Patient not taking: Reported on 09/03/2023 02/14/23   [provider]  buPROPion  (WELLBUTRIN  XL) 150 MG 24 hr tablet TAKE 1 TABLET BY MOUTH DAILY IN THE MORNING 09/29/23     clopidogrel  (PLAVIX ) 75 MG tablet Take 75 mg by mouth daily.    [provider]  clopidogrel  (PLAVIX ) 75 MG tablet Take 1 tablet (75 mg total) by mouth daily. 06/18/23     Continuous Glucose Sensor (FREESTYLE LIBRE 3 PLUS SENSOR) MISC Use as directed. Replace sensor every 15 days 09/23/23     Continuous Glucose Sensor (FREESTYLE LIBRE 3 SENSOR) MISC USE AS DIRECTED. CHANGE EVERY 14 DAYS 02/04/23   [provider]  diclofenac  Sodium (VOLTAREN ) 1 % GEL Apply 4 g topically to the affected area(s) 4 (four) times daily. 06/18/23  Dulaglutide  (TRULICITY ) 0.75 MG/0.5ML SOAJ Inject 0.75 mg into the skin once a week. 10/09/22     Empagliflozin-metFORMIN HCl (SYNJARDY ) 12.5-500 MG TABS Take 1 tablet by mouth 2 (two) times daily with meals. 01/02/23     fluticasone  (FLONASE ) 50 MCG/ACT nasal spray Place 2 sprays into both nostrils in the morning and at bedtime.    [provider]  insulin  glargine (LANTUS  SOLOSTAR) 100 UNIT/ML Solostar Pen Inject 40 Units into the skin daily. 08/21/23     Insulin  Pen Needle 32G X 4 MM MISC Use with Lantus  every day. 03/08/23     LANTUS  SOLOSTAR 100 UNIT/ML Solostar Pen Inject 40 Units into the skin daily. Patient taking differently: Inject 30 Units into the skin daily. 02/27/23   [provider]  levofloxacin  (LEVAQUIN ) 750 MG tablet Take 750 mg by  mouth daily. 08/25/23   [provider]  levofloxacin  (LEVAQUIN ) 750 MG tablet Take 1 tablet by mouth 1 hour before your scheduled procedure as directed. 08/25/23     metFORMIN (GLUCOPHAGE) 1000 MG tablet Take 1,000 mg by mouth 2 (two) times daily. 02/11/23   [provider]  Multiple Vitamins-Minerals (CENTRUM SILVER PO) Take 1 tablet by mouth at bedtime.    [provider]  olopatadine (PATANOL) 0.1 % ophthalmic solution Apply to eye. 07/28/22   [provider]  rosuvastatin  (CRESTOR ) 10 MG tablet Take 1 tablet (10 mg total) by mouth at bedtime for cholesterol. 01/01/23     rosuvastatin  (CRESTOR ) 20 MG tablet Take 1 tablet (20 mg total) by mouth at bedtime. 05/14/23     rosuvastatin  (CRESTOR ) 40 MG tablet Take 40 mg by mouth at bedtime. 06/02/23   [provider]  rosuvastatin  (CRESTOR ) 40 MG tablet Take 1 tablet (40 mg total) by mouth at bedtime for cholesterol. 02/25/23     RYBELSUS 7 MG TABS  12/17/21   [provider]  tamsulosin  (FLOMAX ) 0.4 MG CAPS capsule Take 0.4 mg by mouth daily. 06/06/23   [provider]  valsartan -hydrochlorothiazide  (DIOVAN -HCT) 320-25 MG tablet Take 1 tablet by mouth daily. 03/09/23   [provider]  valsartan -hydrochlorothiazide  (DIOVAN -HCT) 320-25 MG tablet Take 1 tablet by mouth daily. 07/21/23   [name], MD  valsartan -hydrochlorothiazide  (DIOVAN -HCT) 320-25 MG tablet Take 1 tablet by mouth daily. 02/25/23     valsartan -hydrochlorothiazide  (DIOVAN -HCT) 320-25 MG tablet Take 1 tablet by mouth daily. 12/24/22       Allergies: Patient has no known allergies.    Review of Systems  Updated Vital Signs BP 133/74   Pulse 92   Temp 98.3 F (36.8 C) (Oral)   Resp 18   SpO2 99%   Physical Exam  (all labs ordered are listed, but only abnormal results are displayed) Labs Reviewed - No data to display  EKG: None  Radiology: No results found.  {Document cardiac monitor, telemetry  assessment procedure when appropriate:32947} Procedures   Medications Ordered in the ED - No data to display    {Click here for ABCD2, HEART and other calculators REFRESH Note before signing:1}                              Medical Decision Making  ***  {Document critical care time when appropriate  Document review of labs and clinical decision tools ie CHADS2VASC2, etc  Document your independent review of radiology images and any outside records  Document your discussion with family members, caretakers and with consultants  Document social determinants of health affecting pt's care  Document your decision making why or why not admission, treatments were needed:32947:::1}   Final diagnoses:  None    ED Discharge Orders     None

## 2023-11-10 NOTE — Progress Notes (Signed)
 GU Location of Tumor / Histology: Prostate Ca  If Prostate Cancer, Gleason Score is (3 + 4) and PSA is (4.31)  Maurice VEAR Schaffer Sr. presented as referral from Dr. Lonni Han [facility]) elevated PSA.  Biopsies      08/12/2023 Dr. Lonni Han MR Prostate with/without Contrast CLINICAL DATA:  Elevated PSA equal 4.31   IMPRESSION: 1. No evidence of high-grade carcinoma in peripheral zone. Heterogeneity of signal intensity in the peripheral zone suggest prior prostate information. PI-RADS (v2.1): 2 2. Enlarged nodular transitional zone most consistent with benign prostate hypertrophy. PI-RADS(v2.1): 2, 3. Enhancing metastasis within the bones of the pelvis correspond to metastatic skeletal lesions on comparison CT from 01/07/2023    Past/Anticipated interventions by urology, if any: NA  Past/Anticipated interventions by medical oncology, if any: NA  Weight changes, if any:  No  IPSS:  11 SHIM:  5  Bowel/Bladder complaints, if any:  No  Nausea/Vomiting, if any: No  Pain issues, if any:  No  SAFETY ISSUES: Prior radiation? No Pacemaker/ICD? No Possible current pregnancy? Male Is the patient on methotrexate? No  Current Complaints / other details:  None  30 minutes spent total, including time for meaningful use questions, reviewing medication, as well as spent in face-to-face time in nurse evaluation with the patient.

## 2023-11-15 NOTE — Progress Notes (Signed)
 Radiation Oncology         [phone] ________________________________  Initial Outpatient Consultation  Name: Maurice NICKOLSON Sr. MRN: [id number]  Date: 11/16/2023  DOB: Feb 03, 1950  RR:Iztzb, Almarie SAUNDERS, MD  [name] Lonni Fire*   REFERRING PHYSICIAN: [name] Lonni Fire*  DIAGNOSIS: 73 y.o. gentleman with Stage T1c adenocarcinoma of the prostate with Gleason score of 3+4, and PSA of 4.4.  No diagnosis found.  HISTORY OF PRESENT ILLNESS: Maurice RISDON Sr. is a 73 y.o. male with a diagnosis of prostate cancer. He also has a history of T-cell lymphoma, diagnosed in 06/2020 in the liver. He was treated with 6 cycles of BV-CHP and subsequent scans have shown stable osseous lesions. He was noted to have an elevated PSA of 4.31 by his primary care physician, Dr. Waylan.  Accordingly, he was referred for evaluation in urology by Dr. [name] on 04/16/23. A repeat PSA obtained that day showed persistent elevation at 4.4. He underwent a prostate MRI on 08/12/23 showing: no evidence of high grade carcinoma; enhancing metastasis within bones of pelvis correspond to skeletal lesions on comparison CT from 12/2022. The patient proceeded to transrectal ultrasound with 12 biopsies of the prostate on 10/15/23.  The prostate volume measured 23 cc.  Out of 12 core biopsies, 6 were positive.  The maximum Gleason score was 3+4, and this was seen in left apex lateral (small focus), right apex, and right apex lateral. Additionally, Gleason 3+3 was seen in left apex (small focus), left mid (small focus), and left mid lateral.  The patient reviewed the biopsy results with his urologist and he has kindly been referred today for discussion of potential radiation treatment options.   PREVIOUS RADIATION THERAPY: No  PAST MEDICAL HISTORY:  Past Medical History:  Diagnosis Date   Arthritis    Cancer (HCC)    Hyperlipidemia    Hypertension    followed by pcp  (11-23-2019  per pt had stress test greater than  20 yrs ago, told ok)   Nocturia    OSA on CPAP    per pt uses nightly   Phimosis    Sleep apnea    Type 2 diabetes mellitus (HCC)    followed by pcp   (11-23-2019 per pt does not check blood sugar at home)   Wears glasses       PAST SURGICAL HISTORY: Past Surgical History:  Procedure Laterality Date   APPENDECTOMY  child   CHOLECYSTECTOMY N/A 07/09/2020   Procedure: LAPAROSCOPIC CHOLECYSTECTOMY WITH INTRAOPERATIVE CHOLANGIOGRAM,;  Surgeon: Tanda Locus, MD;  Location: [facility];  Service: General;  Laterality: N/A;   CIRCUMCISION N/A 11/29/2019   Procedure: CIRCUMCISION ADULT;  Surgeon: Rosalind Zachary NOVAK, MD;  Location: [facility];  Service: Urology;  Laterality: N/A;   IR IMAGING GUIDED PORT INSERTION  08/06/2020   IR REMOVAL TUN ACCESS W/ PORT W/O FL MOD SED  04/09/2021   LIVER BIOPSY N/A 07/09/2020   Procedure: LAP LIVER BIOPSY;  Surgeon: Tanda Locus, MD;  Location: [facility];  Service: General;  Laterality: N/A;   ROTATOR CUFF REPAIR Left chld   SUPRA-UMBILICAL HERNIA N/A 07/09/2020   Procedure: PRIMARY REPAIR SUPRA-UMBILICAL HERNIA;  Surgeon: Tanda Locus, MD;  Location: [facility];  Service: General;  Laterality: N/A;    FAMILY HISTORY:  Family History  Problem Relation Age of Onset   Hypotension Mother    Diabetes Mellitus II Father    Colon cancer Sister    Prostate cancer Brother    Prostate  cancer Brother     SOCIAL HISTORY:  Social History   Socioeconomic History   Marital status: Divorced    Spouse name: Not on file   Number of children: Not on file   Years of education: Not on file   Highest education level: Not on file  Occupational History   Not on file  Tobacco Use   Smoking status: Every Day    Current packs/day: 1.00    Average packs/day: 1 pack/day for 49.0 years (49.0 ttl pk-yrs)    Types: Cigars, Cigarettes   Smokeless tobacco: Never   Tobacco comments:    09/03/2023 Patient smokes 10 little cigars daily  Vaping Use   Vaping status:  Never Used  Substance and Sexual Activity   Alcohol  use: Not Currently   Drug use: Never   Sexual activity: Not on file  Other Topics Concern   Not on file  Social History Narrative   Not on file   Social Drivers of Health   Financial Resource Strain: Not on file  Food Insecurity: Not on file  Transportation Needs: Not on file  Physical Activity: Not on file  Stress: Not on file  Social Connections: Unknown (06/03/2021)   Received from [facility]   Social Network    Social Network: Not on file  Intimate Partner Violence: Unknown (04/25/2021)   Received from [facility]   HITS    Physically Hurt: Not on file    Insult or Talk Down To: Not on file    Threaten Physical Harm: Not on file    Scream or Curse: Not on file    ALLERGIES: Patient has no known allergies.  MEDICATIONS:  Current Outpatient Medications  Medication Sig Dispense Refill   albuterol  (VENTOLIN  HFA) 108 (90 Base) MCG/ACT inhaler Inhale 2 puffs into the lungs every 6 (six) hours as needed.     albuterol  (VENTOLIN  HFA) 108 (90 Base) MCG/ACT inhaler Inhale 2 puffs into the lungs every 4 (four) hours as needed for shortness of breath or wheezing.     Albuterol -Budesonide (AIRSUPRA ) 90-80 MCG/ACT AERO Take 2 puffs by mouth every 4 (four) to 6 (six) hours as needed for shortness of breath. Rinse mouth out with water after each use. 10.7 g 3   amLODipine  (NORVASC ) 2.5 MG tablet Take 1 tablet (2.5 mg total) by mouth daily. 90 tablet 1   azelastine  (ASTELIN ) 0.1 % nasal spray Place 2 sprays into both nostrils 2 (two) times daily as needed. 30 mL 11   buPROPion  (WELLBUTRIN  XL) 150 MG 24 hr tablet TAKE 1 TABLET BY MOUTH DAILY IN THE MORNING 90 tablet 3   clopidogrel  (PLAVIX ) 75 MG tablet Take 1 tablet (75 mg total) by mouth daily. 90 tablet 3   Continuous Glucose Sensor (FREESTYLE LIBRE 3 PLUS SENSOR) MISC Use as directed. Replace sensor every 15 days 2 each 1   diclofenac  Sodium (VOLTAREN ) 1 % GEL Apply 4 g topically  to the affected area(s) 4 (four) times daily. 500 g 3   Dulaglutide  (TRULICITY ) 0.75 MG/0.5ML SOAJ Inject 0.75 mg into the skin once a week. 2 mL 1   Empagliflozin-metFORMIN HCl (SYNJARDY ) 12.5-500 MG TABS Take 1 tablet by mouth 2 (two) times daily with meals. 145 tablet 3   fluticasone  (FLONASE ) 50 MCG/ACT nasal spray Place 2 sprays into both nostrils in the morning and at bedtime.     insulin  glargine (LANTUS  SOLOSTAR) 100 UNIT/ML Solostar Pen Inject 40 Units into the skin daily. 9 mL 2  Insulin  Pen Needle 32G X 4 MM MISC Use with Lantus  every day. 100 each 11   metFORMIN (GLUCOPHAGE) 1000 MG tablet Take 1,000 mg by mouth 2 (two) times daily.     Multiple Vitamins-Minerals (CENTRUM SILVER PO) Take 1 tablet by mouth at bedtime.     olopatadine (PATANOL) 0.1 % ophthalmic solution Apply to eye.     predniSONE  (DELTASONE ) 10 MG tablet Use 4 tablets for 4 days, 2 tablets for 3 days, 1 tablet for 3 days 35 tablet 0   rosuvastatin  (CRESTOR ) 10 MG tablet Take 1 tablet (10 mg total) by mouth at bedtime for cholesterol. 90 tablet 3   rosuvastatin  (CRESTOR ) 20 MG tablet Take 1 tablet (20 mg total) by mouth at bedtime. 30 tablet 11   rosuvastatin  (CRESTOR ) 40 MG tablet Take 40 mg by mouth at bedtime.     RYBELSUS 7 MG TABS      tamsulosin  (FLOMAX ) 0.4 MG CAPS capsule Take 0.4 mg by mouth daily.     valsartan -hydrochlorothiazide  (DIOVAN -HCT) 320-25 MG tablet Take 1 tablet by mouth daily. 90 tablet 3   No current facility-administered medications for this encounter.    REVIEW OF SYSTEMS:  On review of systems, the patient reports that he is doing well overall. He denies any chest pain, shortness of breath, cough, fevers, chills, night sweats, unintended weight changes. He denies any bowel disturbances, and denies abdominal pain, nausea or vomiting. He denies any new musculoskeletal or joint aches or pains. His IPSS was ***, indicating *** urinary symptoms. His SHIM was ***, indicating he {does not have/has  mild/moderate/severe} erectile dysfunction. A complete review of systems is obtained and is otherwise negative.    PHYSICAL EXAM:  Wt Readings from Last 3 Encounters:  09/03/23 242 lb 9.6 oz (110 kg)  07/23/23 242 lb 8 oz (110 kg)  06/25/23 245 lb (111.1 kg)   Temp Readings from Last 3 Encounters:  11/08/23 98.3 F (36.8 C) (Oral)  07/23/23 (!) 97.4 F (36.3 C) (Temporal)  06/25/23 98.1 F (36.7 C)   BP Readings from Last 3 Encounters:  11/08/23 128/83  09/03/23 114/60  09/01/23 (!) 116/52   Pulse Readings from Last 3 Encounters:  11/08/23 69  09/03/23 96  09/01/23 85    /10  In general this is a well appearing *** male in no acute distress. He's alert and oriented x4 and appropriate throughout the examination. Cardiopulmonary assessment is negative for acute distress, and he exhibits normal effort.     KPS = ***  100 - Normal; no complaints; no evidence of disease. 90   - Able to carry on normal activity; minor signs or symptoms of disease. 80   - Normal activity with effort; some signs or symptoms of disease. 42   - Cares for self; unable to carry on normal activity or to do active work. 60   - Requires occasional assistance, but is able to care for most of his personal needs. 50   - Requires considerable assistance and frequent medical care. 40   - Disabled; requires special care and assistance. 30   - Severely disabled; hospital admission is indicated although death not imminent. 20   - Very sick; hospital admission necessary; active supportive treatment necessary. 10   - Moribund; fatal processes progressing rapidly. 0     - Dead  Karnofsky DA, Abelmann WH, Craver LS and Burchenal [facility] [phone]) The use of the nitrogen mustards in the palliative treatment of carcinoma: with particular reference to bronchogenic  carcinoma Cancer 1 634-56  LABORATORY DATA:  Lab Results  Component Value Date   WBC 4.9 11/08/2023   HGB 13.0 11/08/2023   HCT 38.9 (L) 11/08/2023   MCV  88.0 11/08/2023   PLT 230 11/08/2023   Lab Results  Component Value Date   NA 139 11/08/2023   K 4.0 11/08/2023   CL 103 11/08/2023   CO2 24 11/08/2023   Lab Results  Component Value Date   ALT 40 11/08/2023   AST 39 11/08/2023   ALKPHOS 80 11/08/2023   BILITOT 0.6 11/08/2023     RADIOGRAPHY: No results found.    IMPRESSION/PLAN: 1. 73 y.o. gentleman with Stage T1c adenocarcinoma of the prostate with Gleason Score of 3+4, and PSA of 4.4. We discussed the patient's workup and outlined the nature of prostate cancer in this setting. The patient's T stage, Gleason's score, and PSA put him into the favorable intermediate risk group. Accordingly, he is eligible for a variety of potential treatment options including brachytherapy, 5.5 weeks of external radiation, or prostatectomy. We discussed the available radiation techniques, and focused on the details and logistics of delivery. We discussed and outlined the risks, benefits, short and long-term effects associated with radiotherapy and compared and contrasted these with prostatectomy. We discussed the role of SpaceOAR gel in reducing the rectal toxicity associated with radiotherapy. He appears to have a good understanding of his disease and our treatment recommendations which are of curative intent.  He was encouraged to ask questions that were answered to his stated satisfaction.  At the conclusion of our conversation, the patient is interested in moving forward with ***.  We personally spent *** minutes in this encounter including chart review, reviewing radiological studies, meeting face-to-face with the patient, entering orders and completing documentation.    Sabra MICAEL Rusk, PA-C    Donnice Barge, MD  [facility]  Radiation Oncology Direct Dial: [phone]  Fax: [phone] [email or web address]  Skype  [email or web address]   This document serves as a record of services personally performed by Donnice Barge, MD and Sabra Rusk,  PA-C. It was created on their behalf by Izetta Neither, a trained medical scribe. The creation of this record is based on the scribe's personal observations and the provider's statements to them. This document has been checked and approved by the attending provider.

## 2023-11-16 ENCOUNTER — Ambulatory Visit
Admission: RE | Admit: 2023-11-16 | Discharge: 2023-11-16 | Disposition: A | Discharge status: Home / Self Care | Source: Ambulatory Visit | Attending: Radiation Oncology | Admitting: Radiation Oncology

## 2023-11-16 ENCOUNTER — Encounter: Payer: Self-pay | Admitting: Radiation Oncology

## 2023-11-16 ENCOUNTER — Encounter: Payer: Self-pay | Admitting: Urology

## 2023-11-16 VITALS — BP 116/69 | Temp 97.4°F | Resp 20 | Ht 70.0 in | Wt 248.0 lb

## 2023-11-16 DIAGNOSIS — Z7985 Long-term (current) use of injectable non-insulin antidiabetic drugs: Secondary | ICD-10-CM | POA: Insufficient documentation

## 2023-11-16 DIAGNOSIS — Z7984 Long term (current) use of oral hypoglycemic drugs: Secondary | ICD-10-CM | POA: Diagnosis not present

## 2023-11-16 DIAGNOSIS — G4730 Sleep apnea, unspecified: Secondary | ICD-10-CM | POA: Diagnosis not present

## 2023-11-16 DIAGNOSIS — C61 Malignant neoplasm of prostate: Secondary | ICD-10-CM | POA: Diagnosis not present

## 2023-11-16 DIAGNOSIS — Z7902 Long term (current) use of antithrombotics/antiplatelets: Secondary | ICD-10-CM | POA: Diagnosis not present

## 2023-11-16 DIAGNOSIS — Z8572 Personal history of non-Hodgkin lymphomas: Secondary | ICD-10-CM | POA: Insufficient documentation

## 2023-11-16 DIAGNOSIS — Z79899 Other long term (current) drug therapy: Secondary | ICD-10-CM | POA: Insufficient documentation

## 2023-11-16 DIAGNOSIS — F17210 Nicotine dependence, cigarettes, uncomplicated: Secondary | ICD-10-CM | POA: Insufficient documentation

## 2023-11-16 DIAGNOSIS — M129 Arthropathy, unspecified: Secondary | ICD-10-CM | POA: Diagnosis not present

## 2023-11-16 DIAGNOSIS — I10 Essential (primary) hypertension: Secondary | ICD-10-CM | POA: Insufficient documentation

## 2023-11-16 DIAGNOSIS — Z800 Family history of malignant neoplasm of digestive organs: Secondary | ICD-10-CM | POA: Diagnosis not present

## 2023-11-16 DIAGNOSIS — Z791 Long term (current) use of non-steroidal anti-inflammatories (NSAID): Secondary | ICD-10-CM | POA: Insufficient documentation

## 2023-11-16 DIAGNOSIS — E119 Type 2 diabetes mellitus without complications: Secondary | ICD-10-CM | POA: Diagnosis not present

## 2023-11-16 DIAGNOSIS — Z9221 Personal history of antineoplastic chemotherapy: Secondary | ICD-10-CM | POA: Diagnosis not present

## 2023-11-16 DIAGNOSIS — Z8042 Family history of malignant neoplasm of prostate: Secondary | ICD-10-CM | POA: Diagnosis not present

## 2023-11-16 DIAGNOSIS — Z191 Hormone sensitive malignancy status: Secondary | ICD-10-CM | POA: Diagnosis not present

## 2023-11-16 DIAGNOSIS — E785 Hyperlipidemia, unspecified: Secondary | ICD-10-CM | POA: Diagnosis not present

## 2023-11-16 DIAGNOSIS — Z794 Long term (current) use of insulin: Secondary | ICD-10-CM | POA: Insufficient documentation

## 2023-11-16 DIAGNOSIS — C7951 Secondary malignant neoplasm of bone: Secondary | ICD-10-CM | POA: Insufficient documentation

## 2023-11-17 ENCOUNTER — Encounter: Payer: Self-pay | Admitting: Hematology and Oncology

## 2023-11-23 DIAGNOSIS — R739 Hyperglycemia, unspecified: Secondary | ICD-10-CM | POA: Diagnosis not present

## 2023-11-23 DIAGNOSIS — E1165 Type 2 diabetes mellitus with hyperglycemia: Secondary | ICD-10-CM | POA: Diagnosis not present

## 2023-11-23 DIAGNOSIS — I10 Essential (primary) hypertension: Secondary | ICD-10-CM | POA: Diagnosis not present

## 2023-11-26 ENCOUNTER — Other Ambulatory Visit (HOSPITAL_BASED_OUTPATIENT_CLINIC_OR_DEPARTMENT_OTHER): Payer: Self-pay

## 2023-11-26 DIAGNOSIS — F331 Major depressive disorder, recurrent, moderate: Secondary | ICD-10-CM | POA: Diagnosis not present

## 2023-11-26 DIAGNOSIS — I129 Hypertensive chronic kidney disease with stage 1 through stage 4 chronic kidney disease, or unspecified chronic kidney disease: Secondary | ICD-10-CM | POA: Diagnosis not present

## 2023-11-26 DIAGNOSIS — Z7984 Long term (current) use of oral hypoglycemic drugs: Secondary | ICD-10-CM | POA: Diagnosis not present

## 2023-11-26 DIAGNOSIS — E1165 Type 2 diabetes mellitus with hyperglycemia: Secondary | ICD-10-CM | POA: Diagnosis not present

## 2023-11-26 DIAGNOSIS — E1122 Type 2 diabetes mellitus with diabetic chronic kidney disease: Secondary | ICD-10-CM | POA: Diagnosis not present

## 2023-11-26 DIAGNOSIS — E1143 Type 2 diabetes mellitus with diabetic autonomic (poly)neuropathy: Secondary | ICD-10-CM | POA: Diagnosis not present

## 2023-11-26 DIAGNOSIS — G4700 Insomnia, unspecified: Secondary | ICD-10-CM | POA: Diagnosis not present

## 2023-11-26 DIAGNOSIS — E1121 Type 2 diabetes mellitus with diabetic nephropathy: Secondary | ICD-10-CM | POA: Diagnosis not present

## 2023-11-26 MED ORDER — LANTUS SOLOSTAR 100 UNIT/ML ~~LOC~~ SOPN
20.0000 [IU] | PEN_INJECTOR | Freq: Every day | SUBCUTANEOUS | 2 refills | Status: DC
  Filled 2023-11-26: qty 15, 75d supply, fill #0

## 2023-11-26 MED ORDER — BUPROPION HCL ER (XL) 150 MG PO TB24
150.0000 mg | ORAL_TABLET | Freq: Every morning | ORAL | 3 refills | Status: DC
  Filled 2023-11-26: qty 90, 90d supply, fill #0

## 2023-11-26 MED ORDER — VALSARTAN-HYDROCHLOROTHIAZIDE 320-25 MG PO TABS
1.0000 | ORAL_TABLET | Freq: Every day | ORAL | 1 refills | Status: AC
  Filled 2023-11-26: qty 90, 90d supply, fill #0

## 2023-12-02 ENCOUNTER — Other Ambulatory Visit: Payer: Self-pay | Admitting: Urology

## 2023-12-04 ENCOUNTER — Other Ambulatory Visit (HOSPITAL_BASED_OUTPATIENT_CLINIC_OR_DEPARTMENT_OTHER): Payer: Self-pay

## 2023-12-04 DIAGNOSIS — C61 Malignant neoplasm of prostate: Secondary | ICD-10-CM | POA: Diagnosis not present

## 2023-12-04 DIAGNOSIS — R3915 Urgency of urination: Secondary | ICD-10-CM | POA: Diagnosis not present

## 2023-12-04 DIAGNOSIS — N401 Enlarged prostate with lower urinary tract symptoms: Secondary | ICD-10-CM | POA: Diagnosis not present

## 2023-12-04 DIAGNOSIS — R3912 Poor urinary stream: Secondary | ICD-10-CM | POA: Diagnosis not present

## 2023-12-04 MED ORDER — TAMSULOSIN HCL 0.4 MG PO CAPS
0.4000 mg | ORAL_CAPSULE | Freq: Every day | ORAL | 3 refills | Status: DC
  Filled 2023-12-04: qty 90, 90d supply, fill #0

## 2023-12-06 ENCOUNTER — Other Ambulatory Visit: Payer: Self-pay | Admitting: Urology

## 2023-12-06 DIAGNOSIS — C61 Malignant neoplasm of prostate: Secondary | ICD-10-CM

## 2023-12-07 ENCOUNTER — Other Ambulatory Visit (HOSPITAL_BASED_OUTPATIENT_CLINIC_OR_DEPARTMENT_OTHER): Payer: Self-pay

## 2023-12-08 NOTE — Progress Notes (Signed)
 RN left message to review next steps with fiducial marker's, spaceOAR gel placement, and CT Sim/MR.  Direct contact left for call back.

## 2023-12-09 ENCOUNTER — Other Ambulatory Visit: Payer: Self-pay | Admitting: Urology

## 2023-12-09 ENCOUNTER — Encounter (HOSPITAL_COMMUNITY): Payer: Self-pay | Admitting: Urology

## 2023-12-09 NOTE — Progress Notes (Signed)
 Spoke w/ via phone for pre-op interview---Maurice Fox needs dos----  BMP, CBG and A1C per anesthesia.       Fox results------Current EKG in Epic dated 06/25/23. COVID test -----patient states asymptomatic no test needed Arrive at -------0630 NPO after MN NO Solid Food.   Pre-Surgery Ensure or G2:  Med rec completed Medications to take morning of surgery ----- Bring Albuterol  inhaler. Airsupra  inhaler. Diabetic medication -----NO insulin  AM or PO Diabetes meds AM of surgery. Hold Rybelsus at least 24hrs prior to surgery.  GLP1 agonist last dose: GLP1 instructions:  Patient instructed no nail polish to be worn day of surgery Patient instructed to bring photo id and insurance card day of surgery Patient aware to have Driver (ride ) / caregiver    for 24 hours after surgery - Grandson Maurice Fox Patient Special Instructions ----- Fleet enema per surgeons instructions. Pre-Op special Instructions ----- Plavix  is listed as current medication on med list. Per patient he is adamant he no longer takes Plavix .  Patient verbalized understanding of instructions that were given at this phone interview. Patient denies chest pain, sob, fever, cough at the interview.

## 2023-12-16 ENCOUNTER — Ambulatory Visit (HOSPITAL_COMMUNITY): Admission: RE | Admit: 2023-12-16 | Source: Ambulatory Visit

## 2023-12-16 ENCOUNTER — Ambulatory Visit: Admitting: Radiation Oncology

## 2023-12-23 ENCOUNTER — Other Ambulatory Visit (HOSPITAL_BASED_OUTPATIENT_CLINIC_OR_DEPARTMENT_OTHER): Payer: Self-pay

## 2023-12-23 MED ORDER — FREESTYLE LIBRE 3 PLUS SENSOR MISC
11 refills | Status: AC
  Filled 2023-12-23: qty 2, 30d supply, fill #0

## 2023-12-29 ENCOUNTER — Encounter (HOSPITAL_COMMUNITY): Payer: Self-pay | Admitting: Urology

## 2023-12-29 ENCOUNTER — Encounter (HOSPITAL_COMMUNITY): Payer: Self-pay

## 2023-12-29 NOTE — Progress Notes (Signed)
 Spoke w/ via phone for pre-op interview--- Maurice Fox needs dos---- A1C, BMP, CBG, per anesthesia.        Fox results------ Current EKG in Epic dated 06/25/23 COVID test -----patient states asymptomatic no test needed Arrive at -------0530 NPO after MN NO Solid Food.   Pre-Surgery Ensure or G2:  Med rec completed Medications to take morning of surgery ----- Bring Albuterol  inhaler, Airsupra  inhaler. Diabetic medication ----- No diabetes medication or insulin  AM of surgery. Hold Rybelsus at least 24hrs prior to procedure.  GLP1 agonist last dose: GLP1 instructions:  Patient instructed no nail polish to be worn day of surgery Patient instructed to bring photo id and insurance card day of surgery Patient aware to have Driver (ride ) / caregiver    for 24 hours after surgery - Grandson Maurice Fox Patient Special Instructions -----Fleet enema per surgeon instructions. Pre-Op special Instructions -----  Patient verbalized understanding of instructions that were given at this phone interview. Patient denies chest pain, sob, fever, cough at the interview.

## 2023-12-30 ENCOUNTER — Ambulatory Visit

## 2023-12-31 ENCOUNTER — Ambulatory Visit

## 2024-01-01 ENCOUNTER — Ambulatory Visit

## 2024-01-04 ENCOUNTER — Ambulatory Visit

## 2024-01-04 NOTE — Anesthesia Preprocedure Evaluation (Signed)
 Anesthesia Evaluation  Patient identified by MRN, date of birth, ID band Patient awake    Reviewed: Allergy & Precautions, NPO status , Patient's Chart, lab work & pertinent test results  Airway Mallampati: III  TM Distance: >3 FB Neck ROM: Full    Dental  (+) Dental Advisory Given, Missing   Pulmonary sleep apnea , Current SmokerPatient did not abstain from smoking.   Pulmonary exam normal breath sounds clear to auscultation       Cardiovascular hypertension, Pt. on medications Normal cardiovascular exam Rhythm:Regular Rate:Normal     Neuro/Psych negative neurological ROS     GI/Hepatic negative GI ROS, Neg liver ROS,,,  Endo/Other  diabetes, Type 2, Oral Hypoglycemic Agents, Insulin  Dependent  Obesity   Renal/GU negative Renal ROS   PROSTATE CANCER    Musculoskeletal  (+) Arthritis ,    Abdominal   Peds  Hematology negative hematology ROS (+)   Anesthesia Other Findings   Reproductive/Obstetrics                              Anesthesia Physical Anesthesia Plan  ASA: 3  Anesthesia Plan: MAC   Post-op Pain Management: Tylenol  PO (pre-op)*   Induction: Intravenous  PONV Risk Score and Plan: 0 and TIVA, Treatment may vary due to age or medical condition, Dexamethasone  and Ondansetron   Airway Management Planned: Natural Airway and Simple Face Mask  Additional Equipment:   Intra-op Plan:   Post-operative Plan:   Informed Consent: I have reviewed the patients History and Physical, chart, labs and discussed the procedure including the risks, benefits and alternatives for the proposed anesthesia with the patient or authorized representative who has indicated his/her understanding and acceptance.     Dental advisory given  Plan Discussed with: CRNA  Anesthesia Plan Comments:          Anesthesia Quick Evaluation

## 2024-01-04 NOTE — Consult Note (Signed)
 Urology Preoperative H&P   Chief Complaint: prostate cancer  History of Present Illness: Maurice KNOLL Sr. is a 73 y.o. male with grade 2 prostate cancer.  He has elected to proceed with external beam radiation therapy as primary treatment of his prostate cancer.  He is here today for gold seed fiducial marker and SpaceOAR placement.  Leading up to surgery, the patient reports that he is voiding without difficulty and denies interval UTIs, dysuria or hematuria.   Past Medical History:  Diagnosis Date   Arthritis    Cancer (HCC)    Hyperlipidemia    Hypertension    followed by pcp  (11-23-2019  per pt had stress test greater than 20 yrs ago, told ok)   Nocturia    OSA on CPAP    per pt uses nightly   Phimosis    Sleep apnea    Type 2 diabetes mellitus (HCC)    followed by pcp   (11-23-2019 per pt does not check blood sugar at home)   Wears glasses     Past Surgical History:  Procedure Laterality Date   APPENDECTOMY  child   CHOLECYSTECTOMY N/A 07/09/2020   Procedure: LAPAROSCOPIC CHOLECYSTECTOMY WITH INTRAOPERATIVE CHOLANGIOGRAM,;  Surgeon: Tanda Locus, MD;  Location: [facility];  Service: General;  Laterality: N/A;   CIRCUMCISION N/A 11/29/2019   Procedure: CIRCUMCISION ADULT;  Surgeon: Rosalind Zachary NOVAK, MD;  Location[facility];  Service: Urology;  Laterality: N/A;   IR IMAGING GUIDED PORT INSERTION  08/06/2020   IR REMOVAL TUN ACCESS W/ PORT W/O FL MOD SED  04/09/2021   LIVER BIOPSY N/A 07/09/2020   Procedure: LAP LIVER BIOPSY;  Surgeon: Tanda Locus, MD;  Location: [facility];  Service: General;  Laterality: N/A;   ROTATOR CUFF REPAIR Left chld   SUPRA-UMBILICAL HERNIA N/A 07/09/2020   Procedure: PRIMARY REPAIR SUPRA-UMBILICAL HERNIA;  Surgeon: Tanda Locus, MD;  Location: [facility];  Service: General;  Laterality: N/A;    Allergies: Allergies[1]  Family History  Problem Relation Age of Onset   Hypotension Mother    Diabetes Mellitus II Father    Colon cancer  Sister    Prostate cancer Brother    Prostate cancer Brother     Social History:  reports that he has been smoking cigars and cigarettes. He has a 49 pack-year smoking history. He has never used smokeless tobacco. He reports that he does not currently use alcohol . He reports that he does not use drugs.  ROS: A complete review of systems was performed.  All systems are negative except for pertinent findings as noted.  Physical Exam:  Vital signs in last 24 hours:   Constitutional:  Alert and oriented, No acute distress Cardiovascular: Regular rate and rhythm, No JVD Respiratory: Normal respiratory effort, Lungs clear bilaterally GI: Abdomen is soft, nontender, nondistended, no abdominal masses GU: No CVA tenderness Lymphatic: No lymphadenopathy Neurologic: Grossly intact, no focal deficits Psychiatric: Normal mood and affect  Laboratory Data:  No results for input(s): WBC, HGB, HCT, PLT in the last 72 hours.  No results for input(s): NA, K, CL, GLUCOSE, BUN, CALCIUM , CREATININE in the last 72 hours.  Invalid input(s): CO3   No results found for this or any previous visit (from the past 24 hours). No results found for this or any previous visit (from the past 240 hours).  Renal Function: No results for input(s): CREATININE in the last 168 hours. CrCl cannot be calculated (Patient's most recent lab result is older than  the maximum 21 days allowed.).  Radiologic Imaging: No results found.  I independently reviewed the above imaging studies.  Assessment and Plan Maurice GAEDE Sr. is a 73 y.o. male with grade 2 prostate cancer  The risk, benefits and alternatives of gold seed fiducial marker and SpaceOAR placement was discussed with the patient.  Risk include, but are not limited to, bleeding, urinary tract infection, perineal infection, urethral injury, rectal irritation/ulceration, MI, CVA, DVT and the inherent risk of general anesthesia.  He  voices understanding and wishes to proceed.   Lonni Han, MD 01/04/2024, 5:02 PM  [facility] Pager: [phone]      [1] No Known Allergies

## 2024-01-05 ENCOUNTER — Telehealth: Payer: Self-pay | Admitting: *Deleted

## 2024-01-05 ENCOUNTER — Encounter (HOSPITAL_COMMUNITY): Admission: RE | Disposition: A | Payer: Self-pay | Source: Home / Self Care | Attending: Urology

## 2024-01-05 ENCOUNTER — Ambulatory Visit (HOSPITAL_COMMUNITY): Payer: Self-pay | Admitting: Anesthesiology

## 2024-01-05 ENCOUNTER — Ambulatory Visit

## 2024-01-05 ENCOUNTER — Ambulatory Visit (HOSPITAL_COMMUNITY): Admission: RE | Admit: 2024-01-05 | Discharge: 2024-01-05 | Disposition: A | Attending: Urology | Admitting: Urology

## 2024-01-05 ENCOUNTER — Encounter (HOSPITAL_COMMUNITY): Payer: Self-pay | Admitting: Urology

## 2024-01-05 DIAGNOSIS — E119 Type 2 diabetes mellitus without complications: Secondary | ICD-10-CM | POA: Diagnosis not present

## 2024-01-05 DIAGNOSIS — F17290 Nicotine dependence, other tobacco product, uncomplicated: Secondary | ICD-10-CM | POA: Insufficient documentation

## 2024-01-05 DIAGNOSIS — E669 Obesity, unspecified: Secondary | ICD-10-CM | POA: Insufficient documentation

## 2024-01-05 DIAGNOSIS — Z79899 Other long term (current) drug therapy: Secondary | ICD-10-CM | POA: Insufficient documentation

## 2024-01-05 DIAGNOSIS — Z833 Family history of diabetes mellitus: Secondary | ICD-10-CM | POA: Insufficient documentation

## 2024-01-05 DIAGNOSIS — Z6835 Body mass index (BMI) 35.0-35.9, adult: Secondary | ICD-10-CM | POA: Diagnosis not present

## 2024-01-05 DIAGNOSIS — Z7984 Long term (current) use of oral hypoglycemic drugs: Secondary | ICD-10-CM | POA: Insufficient documentation

## 2024-01-05 DIAGNOSIS — Z8042 Family history of malignant neoplasm of prostate: Secondary | ICD-10-CM | POA: Insufficient documentation

## 2024-01-05 DIAGNOSIS — I10 Essential (primary) hypertension: Secondary | ICD-10-CM | POA: Diagnosis not present

## 2024-01-05 DIAGNOSIS — Z794 Long term (current) use of insulin: Secondary | ICD-10-CM | POA: Diagnosis not present

## 2024-01-05 DIAGNOSIS — G4733 Obstructive sleep apnea (adult) (pediatric): Secondary | ICD-10-CM | POA: Insufficient documentation

## 2024-01-05 DIAGNOSIS — M1990 Unspecified osteoarthritis, unspecified site: Secondary | ICD-10-CM | POA: Diagnosis not present

## 2024-01-05 DIAGNOSIS — C61 Malignant neoplasm of prostate: Secondary | ICD-10-CM | POA: Diagnosis present

## 2024-01-05 DIAGNOSIS — E1165 Type 2 diabetes mellitus with hyperglycemia: Secondary | ICD-10-CM

## 2024-01-05 HISTORY — PX: GOLD SEED IMPLANT: SHX6343

## 2024-01-05 HISTORY — PX: SPACE OAR INSTILLATION: SHX6769

## 2024-01-05 LAB — BASIC METABOLIC PANEL WITH GFR
Anion gap: 5 (ref 5–15)
BUN: 23 mg/dL (ref 8–23)
CO2: 25 mmol/L (ref 22–32)
Calcium: 8.7 mg/dL — ABNORMAL LOW (ref 8.9–10.3)
Chloride: 109 mmol/L (ref 98–111)
Creatinine, Ser: 1.09 mg/dL (ref 0.61–1.24)
GFR, Estimated: >60 mL/min (ref >60)
Glucose, Bld: 190 mg/dL — ABNORMAL HIGH (ref 70–99)
Potassium: 4.1 mmol/L (ref 3.5–5.1)
Sodium: 139 mmol/L (ref 135–145)

## 2024-01-05 LAB — GLUCOSE, CAPILLARY
Glucose-Capillary: 183 mg/dL — ABNORMAL HIGH (ref 70–99)
Glucose-Capillary: 190 mg/dL — ABNORMAL HIGH (ref 70–99)
Glucose-Capillary: 180 mg/dL — ABNORMAL HIGH (ref 70–99)

## 2024-01-05 LAB — HEMOGLOBIN A1C
Hgb A1c MFr Bld: 8.9 % — ABNORMAL HIGH (ref 4.8–5.6)
Mean Plasma Glucose: 208.73 mg/dL

## 2024-01-05 SURGERY — INSERTION, GOLD SEEDS
Anesthesia: Monitor Anesthesia Care | Site: Prostate

## 2024-01-05 MED ORDER — LIDOCAINE 2% (20 MG/ML) 5 ML SYRINGE
INTRAMUSCULAR | Status: DC | PRN
  Administered 2024-01-05: 08:00:00 60 mg via INTRAVENOUS

## 2024-01-05 MED ORDER — LACTATED RINGERS IV SOLN: INTRAVENOUS | Status: DC

## 2024-01-05 MED ORDER — ACETAMINOPHEN 500 MG PO TABS
1000.0000 mg | ORAL_TABLET | Freq: Once | ORAL | Status: AC
  Administered 2024-01-05: 07:00:00 1000 mg via ORAL

## 2024-01-05 MED ORDER — PROPOFOL 500 MG/50ML IV EMUL
INTRAVENOUS | Status: DC | PRN
  Administered 2024-01-05: 08:00:00 150 ug/kg/min via INTRAVENOUS

## 2024-01-05 MED ORDER — FENTANYL CITRATE (PF) 100 MCG/2ML IJ SOLN
INTRAMUSCULAR | Status: AC
  Filled 2024-01-05: qty 2

## 2024-01-05 MED ORDER — CEFAZOLIN SODIUM-DEXTROSE 2-4 GM/100ML-% IV SOLN
2.0000 g | INTRAVENOUS | Status: AC
  Administered 2024-01-05: 08:00:00 2 g via INTRAVENOUS

## 2024-01-05 MED ORDER — LIDOCAINE HCL 1 % IJ SOLN
INTRAMUSCULAR | Status: AC
  Filled 2024-01-05: qty 20

## 2024-01-05 MED ORDER — SODIUM CHLORIDE (PF) 0.9 % IJ SOLN
INTRAMUSCULAR | Status: DC | PRN
  Administered 2024-01-05: 08:00:00 10 mL

## 2024-01-05 MED ORDER — ONDANSETRON HCL 4 MG/2ML IJ SOLN: 4.0000 mg | Freq: Once | INTRAMUSCULAR | Status: DC | PRN

## 2024-01-05 MED ORDER — FLEET ENEMA RE ENEM
1.0000 | ENEMA | Freq: Once | RECTAL | Status: DC
  Filled 2024-01-05: qty 1

## 2024-01-05 MED ORDER — GLYCOPYRROLATE 0.2 MG/ML IJ SOLN
INTRAMUSCULAR | Status: DC | PRN
  Administered 2024-01-05: 08:00:00 .1 mg via INTRAVENOUS

## 2024-01-05 MED ORDER — INSULIN ASPART 100 UNIT/ML IJ SOLN
INTRAMUSCULAR | Status: AC
  Filled 2024-01-05: qty 2

## 2024-01-05 MED ORDER — CHLORHEXIDINE GLUCONATE 0.12 % MT SOLN
15.0000 mL | Freq: Once | OROMUCOSAL | Status: AC
  Administered 2024-01-05: 07:00:00 15 mL via OROMUCOSAL

## 2024-01-05 MED ORDER — LIDOCAINE HCL (PF) 1 % IJ SOLN
INTRAMUSCULAR | Status: DC | PRN
  Administered 2024-01-05: 08:00:00 10 mL

## 2024-01-05 MED ORDER — FENTANYL CITRATE (PF) 100 MCG/2ML IJ SOLN: 25.0000 ug | INTRAMUSCULAR | Status: DC | PRN

## 2024-01-05 MED ORDER — FENTANYL CITRATE (PF) 100 MCG/2ML IJ SOLN
INTRAMUSCULAR | Status: DC | PRN
  Administered 2024-01-05: 08:00:00 25 ug via INTRAVENOUS

## 2024-01-05 MED ORDER — MIDAZOLAM HCL (PF) 2 MG/2ML IJ SOLN
INTRAMUSCULAR | Status: DC | PRN
  Administered 2024-01-05: 08:00:00 2 mg via INTRAVENOUS

## 2024-01-05 MED ORDER — ORAL CARE MOUTH RINSE: 15.0000 mL | Freq: Once | OROMUCOSAL | Status: AC

## 2024-01-05 MED ORDER — MIDAZOLAM HCL 2 MG/2ML IJ SOLN
INTRAMUSCULAR | Status: AC
  Filled 2024-01-05: qty 2

## 2024-01-05 MED ORDER — SODIUM CHLORIDE (PF) 0.9 % IJ SOLN
INTRAMUSCULAR | Status: AC
  Filled 2024-01-05: qty 10

## 2024-01-05 MED ORDER — INSULIN ASPART 100 UNIT/ML IJ SOLN
0.0000 [IU] | INTRAMUSCULAR | Status: DC | PRN
  Administered 2024-01-05: 07:00:00 2 [IU] via SUBCUTANEOUS

## 2024-01-05 SURGICAL SUPPLY — 21 items
BLADE CLIPPER SENSICLIP SURGIC (BLADE) ×1 IMPLANT
CNTNR URN SCR LID CUP LEK RST (MISCELLANEOUS) ×1 IMPLANT
COVER BACK TABLE 60X90IN (DRAPES) ×1 IMPLANT
DRSG TEGADERM 4X4.5 CHG (GAUZE/BANDAGES/DRESSINGS) IMPLANT
DRSG TEGADERM 4X4.75 (GAUZE/BANDAGES/DRESSINGS) ×1 IMPLANT
DRSG TEGADERM 8X12 (GAUZE/BANDAGES/DRESSINGS) ×1 IMPLANT
GAUZE SPONGE 4X4 12PLY STRL (GAUZE/BANDAGES/DRESSINGS) ×1 IMPLANT
GLOVE BIO SURGEON STRL SZ8 (GLOVE) ×1 IMPLANT
IMPL SPACEOAR SYSTEM 10ML (Spacer) ×1 IMPLANT
MARKER GOLD PRELOAD 1.2X3 (Urological Implant) ×1 IMPLANT
MARKER SKIN DUAL TIP RULER LAB (MISCELLANEOUS) ×1 IMPLANT
NDL SPNL 22GX3.5 QUINCKE BK (NEEDLE) ×1 IMPLANT
NEEDLE SPNL 22GX3.5 QUINCKE BK (NEEDLE) ×1 IMPLANT
SHEATH ULTRASOUND LF (SHEATH) IMPLANT
SHEATH ULTRASOUND LTX NONSTRL (SHEATH) ×1 IMPLANT
SLEEVE SCD COMPRESS KNEE MED (STOCKING) ×1 IMPLANT
SURGILUBE 2OZ TUBE FLIPTOP (MISCELLANEOUS) ×1 IMPLANT
SYR 10ML LL (SYRINGE) IMPLANT
SYR CONTROL 10ML LL (SYRINGE) ×1 IMPLANT
TOWEL GREEN STERILE (TOWEL DISPOSABLE) ×1 IMPLANT
UNDERPAD 30X36 HEAVY ABSORB (UNDERPADS AND DIAPERS) ×1 IMPLANT

## 2024-01-05 NOTE — Op Note (Signed)
 Operative Note   Preoperative diagnosis:  1.  Grade 2 prostate cancer    Postoperative diagnosis: 1.  Same    Procedure(s): 1.  Transrectal ultrasound guided prostatic gold seed fiducial marker and Space OAR placement   Surgeon: Lonni Han, MD   Assistants:  None    Anesthesia:  MAC   Complications:  None   EBL:  <5 mL   Specimens: 1. None   Drains/Catheters: 1.  None   Intraoperative findings:   Fiducial markers were placed at the prostatic base, mid-gland and apex.  SpaceOAR was placed with good separation of the prostate and rectum.    Indication: Maurice Fox is a 73 y.o. male with grade 2 prostate cancer.  He has elected to proceed with XRT as primary treatment and is here today for gold seed fiducial marker and SpaceOAR placement.  He has been consented for the above procedures, voices understanding and wishes to proceed.    Description of procedure:   After informed consent the patient was brought to the major OR, placed on the table and administered general anesthesia. He was then moved to the modified lithotomy position with his perineum perpendicular to the floor. His perineum and genitalia were then sterilely prepped. An official timeout was then performed.    Real time transrectal ultrasonography was used visualize the prostate.  Gold seed fiducial markers were then placed transperineally at the prostatic base, mid gland and apex.   I then proceeded with placement of SpaceOAR by introducing a needle with the bevel angled inferiorly approximately 2 cm superior to the anus. This was angled downward and under direct ultrasound was placed within the space between the prostatic capsule and rectum. This was confirmed with a small amount of sterile saline injected and this was performed under direct ultrasound. I then attached the SpaceOAR to the needle and injected this in the space between the prostate and rectum with good placement noted.  The patient tolerated  the procedure well and was transferred to the postanesthesia in stable condition.   Plan: Follow-up in 4 months with PSA

## 2024-01-05 NOTE — Telephone Encounter (Signed)
 Called patient to remind of sim and MRI appts. for 01-07-24- arrival time- @ [facility]- 2:15 PM, informed patient to arrive with a full bladder, patient to report to Radiology @ [facility] after sim for MRI, spoke with patient and he is aware of these appts. and the instructions

## 2024-01-05 NOTE — Transfer of Care (Signed)
 Immediate Anesthesia Transfer of Care Note  Patient: Maurice VEAR Schaffer Sr.  Procedure(s) Performed: INSERTION, GOLD SEEDS (Prostate) INJECTION, HYDROGEL SPACER (Prostate)  Patient Location: PACU  Anesthesia Type:MAC  Level of Consciousness: awake, alert , and oriented  Airway & Oxygen Therapy: Patient Spontanous Breathing  Post-op Assessment: Report given to RN and Post -op Vital signs reviewed and stable  Post vital signs: Reviewed and stable  Last Vitals:  Vitals Value Taken Time  BP 114/70 01/05/24 08:25  Temp 36.5 C 01/05/24 08:25  Pulse 75 01/05/24 08:27  Resp 20 01/05/24 08:27  SpO2 97 % 01/05/24 08:27  Vitals shown include unfiled device data.  Last Pain:  Vitals:   01/05/24 0825  TempSrc:   PainSc: Asleep      Patients Stated Pain Goal: 5 (01/05/24 0558)  Complications: No notable events documented.

## 2024-01-06 ENCOUNTER — Encounter (HOSPITAL_COMMUNITY): Payer: Self-pay | Admitting: Urology

## 2024-01-06 ENCOUNTER — Ambulatory Visit

## 2024-01-06 LAB — GLUCOSE, CAPILLARY: Glucose-Capillary: 224 mg/dL — ABNORMAL HIGH (ref 70–99)

## 2024-01-06 NOTE — Anesthesia Postprocedure Evaluation (Signed)
 Anesthesia Post Note  Patient: Maurice DOLLINS Sr.  Procedure(s) Performed: INSERTION, GOLD SEEDS (Prostate) INJECTION, HYDROGEL SPACER (Prostate)     Patient location during evaluation: PACU Anesthesia Type: MAC Level of consciousness: awake and alert Pain management: pain level controlled Vital Signs Assessment: post-procedure vital signs reviewed and stable Respiratory status: spontaneous breathing, nonlabored ventilation and respiratory function stable Cardiovascular status: stable and blood pressure returned to baseline Postop Assessment: no apparent nausea or vomiting Anesthetic complications: no   No notable events documented.  Last Vitals:  Vitals:   01/05/24 0943 01/05/24 0945  BP: (!) 143/84 (!) 143/84  Pulse: (!) 54 (!) 56  Resp: 16   Temp:    SpO2: 100% 100%    Last Pain:  Vitals:   01/05/24 0945  TempSrc:   PainSc: 0-No pain                 Garnette FORBES Skillern

## 2024-01-07 ENCOUNTER — Ambulatory Visit
Admission: RE | Admit: 2024-01-07 | Discharge: 2024-01-07 | Disposition: A | Discharge status: Home / Self Care | Source: Ambulatory Visit | Attending: Radiation Oncology | Admitting: Radiation Oncology

## 2024-01-07 ENCOUNTER — Ambulatory Visit

## 2024-01-07 ENCOUNTER — Ambulatory Visit (HOSPITAL_COMMUNITY): Admission: RE | Admit: 2024-01-07 | Discharge: 2024-01-07 | Attending: Urology | Admitting: Urology

## 2024-01-07 DIAGNOSIS — C61 Malignant neoplasm of prostate: Secondary | ICD-10-CM | POA: Diagnosis present

## 2024-01-07 NOTE — Progress Notes (Signed)
°  Radiation Oncology         [phone]) [phone] ________________________________  Name: Maurice VEAR Schaffer Sr. MRN: [id number]  Date: 01/07/2024  DOB: 1950-07-18  SIMULATION AND TREATMENT PLANNING NOTE    ICD-10-CM   1. Malignant neoplasm of prostate (HCC)  C61       DIAGNOSIS:  73 y.o. gentleman with Stage T1c adenocarcinoma of the prostate with Gleason score of 3+4, and PSA of 4.4.   NARRATIVE:  The patient was brought to the CT Simulation planning suite.  Identity was confirmed.  All relevant records and images related to the planned course of therapy were reviewed.  The patient freely provided informed written consent to proceed with treatment after reviewing the details related to the planned course of therapy. The consent form was witnessed and verified by the simulation staff.  Then, the patient was set-up in a stable reproducible supine position for radiation therapy.  A vacuum lock pillow device was custom fabricated to position his legs in a reproducible immobilized position.  Then, I performed a urethrogram under sterile conditions to identify the prostatic apex.  CT images were obtained.  Surface markings were placed.  The CT images were loaded into the planning software.  Then the prostate target and avoidance structures including the rectum, bladder, bowel and hips were contoured.  Treatment planning then occurred.  The radiation prescription was entered and confirmed.  A total of one complex treatment devices was fabricated. I have requested : Intensity Modulated Radiotherapy (IMRT) is medically necessary for this case for the following reason:  Rectal sparing.SABRA  PLAN:  The patient will receive 70 Gy in 28 fractions.  ________________________________  Maurice Fox, M.D.

## 2024-01-08 ENCOUNTER — Ambulatory Visit

## 2024-01-10 ENCOUNTER — Ambulatory Visit

## 2024-01-11 ENCOUNTER — Ambulatory Visit

## 2024-01-11 ENCOUNTER — Encounter: Payer: Self-pay | Admitting: Hematology and Oncology

## 2024-01-12 ENCOUNTER — Other Ambulatory Visit (HOSPITAL_BASED_OUTPATIENT_CLINIC_OR_DEPARTMENT_OTHER): Payer: Self-pay

## 2024-01-12 ENCOUNTER — Ambulatory Visit

## 2024-01-12 ENCOUNTER — Other Ambulatory Visit: Payer: Self-pay

## 2024-01-12 MED ORDER — BUPROPION HCL ER (XL) 300 MG PO TB24
300.0000 mg | ORAL_TABLET | Freq: Every morning | ORAL | 3 refills | Status: AC
  Filled 2024-01-12: qty 90, 90d supply, fill #0

## 2024-01-12 MED ORDER — CLOPIDOGREL BISULFATE 75 MG PO TABS: 75.0000 mg | ORAL_TABLET | Freq: Every day | ORAL | 3 refills | Status: DC

## 2024-01-12 MED ORDER — LANTUS SOLOSTAR 100 UNIT/ML ~~LOC~~ SOPN
36.0000 [IU] | PEN_INJECTOR | Freq: Every day | SUBCUTANEOUS | 2 refills | Status: AC
  Filled 2024-01-12 – 2024-01-22 (×2): qty 15, 41d supply, fill #0

## 2024-01-12 MED ORDER — OZEMPIC (0.25 OR 0.5 MG/DOSE) 2 MG/3ML ~~LOC~~ SOPN
0.2500 mg | PEN_INJECTOR | SUBCUTANEOUS | 2 refills | Status: DC
  Filled 2024-01-12: qty 3, 28d supply, fill #0

## 2024-01-13 ENCOUNTER — Ambulatory Visit

## 2024-01-18 ENCOUNTER — Telehealth: Payer: Self-pay | Admitting: Hematology and Oncology

## 2024-01-18 ENCOUNTER — Ambulatory Visit

## 2024-01-18 ENCOUNTER — Telehealth (HOSPITAL_COMMUNITY): Payer: Self-pay

## 2024-01-18 ENCOUNTER — Ambulatory Visit (HOSPITAL_COMMUNITY)
Admission: RE | Admit: 2024-01-18 | Discharge: 2024-01-18 | Disposition: A | Source: Ambulatory Visit | Attending: Hematology and Oncology

## 2024-01-18 ENCOUNTER — Inpatient Hospital Stay (HOSPITAL_COMMUNITY)
Admission: EM | Admit: 2024-01-18 | Discharge: 2024-01-21 | DRG: 175 | Disposition: A | Discharge status: Home / Self Care | Attending: Internal Medicine | Admitting: Internal Medicine

## 2024-01-18 ENCOUNTER — Other Ambulatory Visit (HOSPITAL_COMMUNITY): Payer: Self-pay

## 2024-01-18 ENCOUNTER — Other Ambulatory Visit: Payer: Self-pay

## 2024-01-18 ENCOUNTER — Encounter (HOSPITAL_COMMUNITY): Payer: Self-pay | Admitting: Internal Medicine

## 2024-01-18 DIAGNOSIS — N289 Disorder of kidney and ureter, unspecified: Secondary | ICD-10-CM

## 2024-01-18 DIAGNOSIS — E119 Type 2 diabetes mellitus without complications: Secondary | ICD-10-CM

## 2024-01-18 DIAGNOSIS — I2699 Other pulmonary embolism without acute cor pulmonale: Secondary | ICD-10-CM | POA: Diagnosis not present

## 2024-01-18 DIAGNOSIS — I82432 Acute embolism and thrombosis of left popliteal vein: Secondary | ICD-10-CM | POA: Diagnosis present

## 2024-01-18 DIAGNOSIS — Z8042 Family history of malignant neoplasm of prostate: Secondary | ICD-10-CM

## 2024-01-18 DIAGNOSIS — C8442 Peripheral T-cell lymphoma, not classified, intrathoracic lymph nodes: Secondary | ICD-10-CM | POA: Insufficient documentation

## 2024-01-18 DIAGNOSIS — E1165 Type 2 diabetes mellitus with hyperglycemia: Secondary | ICD-10-CM | POA: Diagnosis present

## 2024-01-18 DIAGNOSIS — G4733 Obstructive sleep apnea (adult) (pediatric): Secondary | ICD-10-CM | POA: Diagnosis present

## 2024-01-18 DIAGNOSIS — Z7985 Long-term (current) use of injectable non-insulin antidiabetic drugs: Secondary | ICD-10-CM

## 2024-01-18 DIAGNOSIS — Z8572 Personal history of non-Hodgkin lymphomas: Secondary | ICD-10-CM | POA: Diagnosis not present

## 2024-01-18 DIAGNOSIS — C61 Malignant neoplasm of prostate: Secondary | ICD-10-CM | POA: Diagnosis not present

## 2024-01-18 DIAGNOSIS — Z720 Tobacco use: Secondary | ICD-10-CM | POA: Diagnosis not present

## 2024-01-18 DIAGNOSIS — I82412 Acute embolism and thrombosis of left femoral vein: Secondary | ICD-10-CM | POA: Diagnosis present

## 2024-01-18 DIAGNOSIS — I2609 Other pulmonary embolism with acute cor pulmonale: Principal | ICD-10-CM | POA: Diagnosis present

## 2024-01-18 DIAGNOSIS — R7989 Other specified abnormal findings of blood chemistry: Secondary | ICD-10-CM | POA: Diagnosis not present

## 2024-01-18 DIAGNOSIS — Z833 Family history of diabetes mellitus: Secondary | ICD-10-CM

## 2024-01-18 DIAGNOSIS — Z7901 Long term (current) use of anticoagulants: Secondary | ICD-10-CM

## 2024-01-18 DIAGNOSIS — Z794 Long term (current) use of insulin: Secondary | ICD-10-CM | POA: Diagnosis not present

## 2024-01-18 DIAGNOSIS — I82409 Acute embolism and thrombosis of unspecified deep veins of unspecified lower extremity: Secondary | ICD-10-CM

## 2024-01-18 DIAGNOSIS — Z7984 Long term (current) use of oral hypoglycemic drugs: Secondary | ICD-10-CM

## 2024-01-18 DIAGNOSIS — I10 Essential (primary) hypertension: Secondary | ICD-10-CM | POA: Diagnosis not present

## 2024-01-18 DIAGNOSIS — Z79899 Other long term (current) drug therapy: Secondary | ICD-10-CM

## 2024-01-18 DIAGNOSIS — F17290 Nicotine dependence, other tobacco product, uncomplicated: Secondary | ICD-10-CM | POA: Diagnosis present

## 2024-01-18 DIAGNOSIS — I82452 Acute embolism and thrombosis of left peroneal vein: Secondary | ICD-10-CM | POA: Diagnosis present

## 2024-01-18 DIAGNOSIS — Z800 Family history of malignant neoplasm of digestive organs: Secondary | ICD-10-CM

## 2024-01-18 DIAGNOSIS — E785 Hyperlipidemia, unspecified: Secondary | ICD-10-CM | POA: Diagnosis present

## 2024-01-18 DIAGNOSIS — Z7902 Long term (current) use of antithrombotics/antiplatelets: Secondary | ICD-10-CM

## 2024-01-18 DIAGNOSIS — I82442 Acute embolism and thrombosis of left tibial vein: Secondary | ICD-10-CM | POA: Diagnosis present

## 2024-01-18 LAB — BASIC METABOLIC PANEL WITH GFR
Anion gap: 12 (ref 5–15)
BUN: 25 mg/dL — ABNORMAL HIGH (ref 8–23)
CO2: 19 mmol/L — ABNORMAL LOW (ref 22–32)
Calcium: 9 mg/dL (ref 8.9–10.3)
Chloride: 99 mmol/L (ref 98–111)
Creatinine, Ser: 1.35 mg/dL — ABNORMAL HIGH (ref 0.61–1.24)
GFR, Estimated: 55 mL/min — ABNORMAL LOW
Glucose, Bld: 262 mg/dL — ABNORMAL HIGH (ref 70–99)
Potassium: 4.5 mmol/L (ref 3.5–5.1)
Sodium: 131 mmol/L — ABNORMAL LOW (ref 135–145)

## 2024-01-18 LAB — PRO BRAIN NATRIURETIC PEPTIDE: Pro Brain Natriuretic Peptide: 129 pg/mL

## 2024-01-18 LAB — APTT: aPTT: 37 s — ABNORMAL HIGH (ref 24–36)

## 2024-01-18 LAB — CBC
HCT: 40.9 % (ref 39.0–52.0)
Hemoglobin: 13.6 g/dL (ref 13.0–17.0)
MCH: 29.4 pg (ref 26.0–34.0)
MCHC: 33.3 g/dL (ref 30.0–36.0)
MCV: 88.5 fL (ref 80.0–100.0)
Platelets: 215 K/uL (ref 150–400)
RBC: 4.62 MIL/uL (ref 4.22–5.81)
RDW: 14 % (ref 11.5–15.5)
WBC: 6.7 K/uL (ref 4.0–10.5)
nRBC: 0 % (ref 0.0–0.2)

## 2024-01-18 LAB — HEPARIN LEVEL (UNFRACTIONATED): Heparin Unfractionated: 0.65 [IU]/mL (ref 0.30–0.70)

## 2024-01-18 LAB — PROTIME-INR
INR: 1.1 (ref 0.8–1.2)
Prothrombin Time: 14.7 s (ref 11.4–15.2)

## 2024-01-18 LAB — TROPONIN T, HIGH SENSITIVITY: Troponin T High Sensitivity: 31 ng/L — ABNORMAL HIGH (ref 0–19)

## 2024-01-18 LAB — I-STAT CG4 LACTIC ACID, ED: Lactic Acid, Venous: 1.3 mmol/L (ref 0.5–1.9)

## 2024-01-18 MED ORDER — DICLOFENAC SODIUM 1 % EX GEL
4.0000 g | Freq: Four times a day (QID) | CUTANEOUS | Status: DC
  Filled 2024-01-18: qty 100

## 2024-01-18 MED ORDER — HYDROCHLOROTHIAZIDE 25 MG PO TABS: 25.0000 mg | ORAL_TABLET | Freq: Every day | ORAL | Status: DC

## 2024-01-18 MED ORDER — ALBUTEROL SULFATE (2.5 MG/3ML) 0.083% IN NEBU: 2.5000 mg | INHALATION_SOLUTION | Freq: Four times a day (QID) | RESPIRATORY_TRACT | Status: DC | PRN

## 2024-01-18 MED ORDER — VALSARTAN-HYDROCHLOROTHIAZIDE 320-25 MG PO TABS: 1.0000 | ORAL_TABLET | Freq: Every day | ORAL | Status: DC

## 2024-01-18 MED ORDER — SODIUM CHLORIDE 0.9% FLUSH
3.0000 mL | Freq: Two times a day (BID) | INTRAVENOUS | Status: DC
  Administered 2024-01-18 – 2024-01-21 (×5): 3 mL via INTRAVENOUS

## 2024-01-18 MED ORDER — HEPARIN BOLUS VIA INFUSION
6500.0000 [IU] | Freq: Once | INTRAVENOUS | Status: AC
  Administered 2024-01-18: 6500 [IU] via INTRAVENOUS
  Filled 2024-01-18: qty 6500

## 2024-01-18 MED ORDER — IOHEXOL 9 MG/ML PO SOLN
500.0000 mL | ORAL | Status: AC
  Administered 2024-01-18 (×2): 500 mL via ORAL

## 2024-01-18 MED ORDER — AZELASTINE HCL 0.1 % NA SOLN: 2.0000 | Freq: Two times a day (BID) | NASAL | Status: DC | PRN

## 2024-01-18 MED ORDER — ACETAMINOPHEN 325 MG PO TABS: 650.0000 mg | ORAL_TABLET | Freq: Four times a day (QID) | ORAL | Status: DC | PRN

## 2024-01-18 MED ORDER — HEPARIN (PORCINE) 25000 UT/250ML-% IV SOLN
1600.0000 [IU]/h | INTRAVENOUS | Status: DC
  Administered 2024-01-18 – 2024-01-21 (×5): 1600 [IU]/h via INTRAVENOUS
  Filled 2024-01-18 (×5): qty 250

## 2024-01-18 MED ORDER — AMLODIPINE BESYLATE 5 MG PO TABS
2.5000 mg | ORAL_TABLET | Freq: Every day | ORAL | Status: DC
  Administered 2024-01-19 – 2024-01-21 (×3): 2.5 mg via ORAL
  Filled 2024-01-18 (×3): qty 1

## 2024-01-18 MED ORDER — IOHEXOL 300 MG/ML  SOLN
100.0000 mL | Freq: Once | INTRAMUSCULAR | Status: AC | PRN
  Administered 2024-01-18: 100 mL via INTRAVENOUS

## 2024-01-18 MED ORDER — ACETAMINOPHEN 650 MG RE SUPP: 650.0000 mg | Freq: Four times a day (QID) | RECTAL | Status: DC | PRN

## 2024-01-18 MED ORDER — ROSUVASTATIN CALCIUM 5 MG PO TABS
10.0000 mg | ORAL_TABLET | Freq: Every day | ORAL | Status: DC
  Administered 2024-01-19 – 2024-01-20 (×2): 10 mg via ORAL
  Filled 2024-01-18 (×2): qty 2

## 2024-01-18 MED ORDER — BUPROPION HCL ER (XL) 150 MG PO TB24
300.0000 mg | ORAL_TABLET | Freq: Every morning | ORAL | Status: DC
  Administered 2024-01-19 – 2024-01-21 (×3): 300 mg via ORAL
  Filled 2024-01-18 (×3): qty 2

## 2024-01-18 MED ORDER — IRBESARTAN 300 MG PO TABS: 300.0000 mg | ORAL_TABLET | Freq: Every day | ORAL | Status: DC

## 2024-01-18 MED ORDER — INSULIN GLARGINE 100 UNIT/ML ~~LOC~~ SOLN
36.0000 [IU] | Freq: Every day | SUBCUTANEOUS | Status: DC
  Administered 2024-01-19 – 2024-01-21 (×3): 36 [IU] via SUBCUTANEOUS
  Filled 2024-01-18 (×4): qty 0.36

## 2024-01-18 MED ORDER — IOHEXOL 350 MG/ML SOLN: 100.0000 mL | Freq: Once | INTRAVENOUS | Status: DC | PRN

## 2024-01-18 NOTE — TOC CM/SW Note (Signed)
 TOC consult for medication cost. Please see Pharmacy note (12/29). Per note, patient copay will be $47/month for either Eliquis 5mg  or Xarelto 20mg .   If patient was unable to use their insurance, private pay pricing through GoodRx is >$600 a month for Eliquis 5mg  and >$590 a month for Xarelto 20mg  or >$220/month for the generic rivaroxaban (2.5mg , 240 tablets/month).  Merilee Batty, MSN, RN Case Management [phone]

## 2024-01-18 NOTE — ED Triage Notes (Signed)
 Patient sent here by oncologist for a blood clot in his lung. He is reporting shob. Denies chest pain. He reports that he had a CT this morning that showed the clot.

## 2024-01-18 NOTE — ED Notes (Signed)
 Pt refused external purewick. Per pt he will use urinal at bedside

## 2024-01-18 NOTE — Telephone Encounter (Signed)
 Called [name] to discuss the results of his CT scan.  Unfortunately findings are consistent with a massive pulmonary embolism with concern for right heart strain.  Called [name] who voices understanding of our findings.  I strongly encouraged him to go to [facility] emergency department for evaluation. He noted he could be there within the hour.  In the emergency department I would recommend echocardiogram and initiation of anticoagulation therapy with consideration of thrombolysis therapy if patient is having heart strain.  Called emergency department to notify them of patient's impending arrival.  Norleen IVAR Kidney, MD Department of Hematology/Oncology [facility] Phone: [phone] Pager: [phone] Email: [email or web address]

## 2024-01-18 NOTE — ED Provider Notes (Signed)
 " [place] [facility]PART[facility] AT [facility] Provider Note   CSN: [id number] Arrival date & time: 01/18/24  1245     Patient presents with: pulmonary embolism    Maurice VEAR Schaffer Sr. is a 73 y.o. male.   HPI 73 year old male presents with a PE found on outpatient CT scan.  He has a history of prostate cancer and follows with Dr. Federico.  He started feeling poorly yesterday morning when he woke up, was lightheaded and weak and felt short of breath.  No chest pain.  He then had a regular scheduled CT today and continues to feel short of breath and weak but no longer lightheaded.  Was called by his oncologist due to extensive PE found on CT.  No recent leg swelling or traveling.  Prior to Admission medications  Medication Sig Start Date End Date Taking? Authorizing Provider  albuterol  (VENTOLIN  HFA) 108 (90 Base) MCG/ACT inhaler Inhale 2 puffs into the lungs every 4 (four) hours as needed for shortness of breath or wheezing. 07/21/23  Yes [provider]  amLODipine  (NORVASC ) 2.5 MG tablet Take 1 tablet (2.5 mg total) by mouth daily. 07/21/23  Yes   azelastine  (ASTELIN ) 0.1 % nasal spray Place 2 sprays into both nostrils 2 (two) times daily as needed. 02/11/23  Yes   buPROPion  (WELLBUTRIN  XL) 300 MG 24 hr tablet Take 1 tablet (300 mg total) by mouth every morning. 01/12/24  Yes   clopidogrel  (PLAVIX ) 75 MG tablet Take 1 tablet (75 mg total) by mouth daily. 01/12/24  Yes   diclofenac  Sodium (VOLTAREN ) 1 % GEL Apply 4 g topically to the affected area(s) 4 (four) times daily. 06/18/23  Yes   Dulaglutide  (TRULICITY ) 0.75 MG/0.5ML SOAJ Inject 0.75 mg into the skin once a week. 10/09/22  Yes   insulin  glargine (LANTUS  SOLOSTAR) 100 UNIT/ML Solostar Pen Inject 36 Units into the skin daily. 01/12/24  Yes   metFORMIN (GLUCOPHAGE) 1000 MG tablet Take 1,000 mg by mouth 2 (two) times daily. 02/11/23  Yes [provider]  Multiple Vitamins-Minerals (CENTRUM SILVER PO) Take 1 tablet  by mouth at bedtime.   Yes [provider]  olopatadine (PATANOL) 0.1 % ophthalmic solution Apply to eye. 07/28/22  Yes [provider]  ORTHOVISC 30 MG/2ML SOSY intra-articular injection  08/08/23  Yes [provider]  rosuvastatin  (CRESTOR ) 10 MG tablet Take 1 tablet (10 mg total) by mouth at bedtime for cholesterol. 01/01/23  Yes   RYBELSUS  7 MG TABS  12/17/21  Yes [provider]  valsartan -hydrochlorothiazide  (DIOVAN -HCT) 320-25 MG tablet Take 1 tablet by mouth daily. 11/26/23  Yes   Albuterol -Budesonide (AIRSUPRA ) 90-80 MCG/ACT AERO Take 2 puffs by mouth every 4 (four) to 6 (six) hours as needed for shortness of breath. Rinse mouth out with water after each use. Patient not taking: Reported on 01/18/2024 06/25/23     buPROPion  (WELLBUTRIN  XL) 150 MG 24 hr tablet TAKE 1 TABLET BY MOUTH DAILY IN THE MORNING Patient not taking: Reported on 01/18/2024 09/29/23     Continuous Glucose Sensor (FREESTYLE LIBRE 3 PLUS SENSOR) MISC Use as directed. Replace sensor every 15 days 12/23/23     Empagliflozin-metFORMIN HCl (SYNJARDY ) 12.5-500 MG TABS Take 1 tablet by mouth 2 (two) times daily with meals. Patient not taking: Reported on 01/18/2024 01/02/23     insulin  glargine (LANTUS  SOLOSTAR) 100 UNIT/ML Solostar Pen Inject 40 Units into the skin daily. Patient not taking: Reported on 01/18/2024 08/21/23  insulin  glargine (LANTUS  SOLOSTAR) 100 UNIT/ML Solostar Pen Inject 20 Units into the skin daily. Patient not taking: Reported on 01/18/2024 11/26/23     Insulin  Pen Needle 32G X 4 MM MISC Use with Lantus  every day. 03/08/23     Semaglutide ,0.25 or 0.5MG /DOS, (OZEMPIC , 0.25 OR 0.5 MG/DOSE,) 2 MG/3ML SOPN Inject 0.25 mg into the skin every 7 (seven) days. Patient not taking: Reported on 01/18/2024 01/12/24     tamsulosin  (FLOMAX ) 0.4 MG CAPS capsule Take 0.4 mg by mouth daily. Patient not taking: Reported on 01/18/2024 06/06/23   [provider]  tamsulosin  (FLOMAX ) 0.4  MG CAPS capsule Take 1 capsule (0.4 mg total) by mouth daily. Patient not taking: Reported on 01/18/2024 12/04/23       Allergies: Patient has no known allergies.    Review of Systems  Respiratory:  Positive for shortness of breath.   Cardiovascular:  Negative for chest pain and leg swelling.  Neurological:  Positive for weakness and light-headedness.    Updated Vital Signs BP 119/71 (BP Location: Right Arm)   Pulse (!) 118   Temp 98.4 F (36.9 C) (Oral)   Resp (!) 21   Ht 5' 10 (1.778 m)   Wt 111.1 kg   SpO2 98%   BMI 35.15 kg/m   Physical Exam Vitals and nursing note reviewed.  Constitutional:      General: He is not in acute distress.    Appearance: He is well-developed. He is not ill-appearing or diaphoretic.  HENT:     Head: Normocephalic and atraumatic.  Cardiovascular:     Rate and Rhythm: Regular rhythm. Tachycardia present.     Heart sounds: Normal heart sounds.  Pulmonary:     Effort: Pulmonary effort is normal.     Breath sounds: Normal breath sounds.  Abdominal:     Palpations: Abdomen is soft.     Tenderness: There is no abdominal tenderness.  Skin:    General: Skin is warm and dry.  Neurological:     Mental Status: He is alert.     (all labs ordered are listed, but only abnormal results are displayed) Labs Reviewed  BASIC METABOLIC PANEL WITH GFR - Abnormal; Notable for the following components:      Result Value   Sodium 131 (*)    CO2 19 (*)    Glucose, Bld 262 (*)    BUN 25 (*)    Creatinine, Ser 1.35 (*)    GFR, Estimated 55 (*)    All other components within normal limits  APTT - Abnormal; Notable for the following components:   aPTT 37 (*)    All other components within normal limits  TROPONIN T, HIGH SENSITIVITY - Abnormal; Notable for the following components:   Troponin T High Sensitivity 31 (*)    All other components within normal limits  CBC  PRO BRAIN NATRIURETIC PEPTIDE  PROTIME-INR  HEPARIN  LEVEL (UNFRACTIONATED)   I-STAT CG4 LACTIC ACID, ED    EKG: None  Radiology: CT CHEST ABDOMEN PELVIS W CONTRAST Addendum Date: 01/18/2024 ** ADDENDUM #1 ** ADDENDUM: Critical findings discussed by telephone with Dr. Norleen Kidney at 11:20 am on 01/18/2024. ---------------------------------------------------- Electronically signed by: Ryan Salvage MD 01/18/2024 11:22 AM EST RP Workstation: [id number]   Result Date: 01/18/2024 ** ORIGINAL REPORT ** EXAM: CT CHEST, ABDOMEN AND PELVIS WITH CONTRAST 01/18/2024 10:28:13 AM TECHNIQUE: CT of the chest, abdomen and pelvis was performed with the administration of 100 cc iohexol  (OMNIPAQUE ) 300 MG/ML solution. Multiplanar reformatted images are  provided for review. Automated exposure control, iterative reconstruction, and/or weight based adjustment of the mA/kV was utilized to reduce the radiation dose to as low as reasonably achievable. COMPARISON: CT scan 01/07/2023. CLINICAL HISTORY: Peripheral T cell lymphoma restaging, recent diagnosis of prostate cancer, shortness of breath. * Tracking Code: BO * FINDINGS: CHEST: MEDIASTINUM AND LYMPH NODES: Heart and pericardium are unremarkable. The central airways are clear. No mediastinal, hilar or axillary lymphadenopathy. Extensive filling defect compatible with embolus in the left main and left upper and lower lobe pulmonary arteries and in the right lower lobe and right middle lobe pulmonary arteries compatible with acute pulmonary embolus. Right ventricular to left ventricular ratio 1.17, suspicious for right heart strain. This is compatible with at least submassive pulmonary embolus. Chest atherosclerosis. LUNGS AND PLEURA: Mild scarring and superimposed atelectasis in the right lower lobe. Mild scarring in the left lower lobe. No pleural effusion or pneumothorax. ABDOMEN AND PELVIS: LIVER: The liver is unremarkable. GALLBLADDER AND BILE DUCTS: Cholecystectomy. No biliary ductal dilatation. SPLEEN: No acute abnormality. PANCREAS: No  acute abnormality. ADRENAL GLANDS: 1.2 x 1.8 x 1.5 cm left adrenal mass was present but not hypermetabolic on 12/26/2020, favoring benign etiology. KIDNEYS, URETERS AND BLADDER: Simple appearing bilateral renal cysts warrant no further imaging workup. No stones in the kidneys or ureters. No hydronephrosis. No perinephric or periureteral stranding. Urinary bladder is unremarkable. GI AND BOWEL: Stomach demonstrates no acute abnormality. There is no bowel obstruction. REPRODUCTIVE ORGANS: Fiducials along the right side of the prostate gland with low density material along the posterior margin of the prostate gland possibly reflecting hydrogel. PERITONEUM AND RETROPERITONEUM: No ascites. No free air. VASCULATURE: Aorta is normal in caliber. Systemic atherosclerosis is present, including the aorta and iliac arteries. ABDOMINAL AND PELVIS LYMPH NODES: No lymphadenopathy. BONES AND SOFT TISSUES: Stable scattered lucent and sclerotic bony lesions throughout the thoracic skeleton. Scattered mixed density lesions in the skeleton appear unchanged. Degenerative arthropathy of both hips with osteochondral lesions posteriorly along the femoral heads and questionable small osteochondral fragments in the right acetabular fossa. Fatty mass of the left quadratus femoris muscle with associated internal calcification, favoring benign lipoma. IMPRESSION: 1. Extensive filling defects compatible with acute pulmonary emboli in the left main, left upper and lower lobe, right middle lobe, and right lower lobe pulmonary arteries, with right ventricular to left ventricular ratio 1.17 concerning for right heart strain consistent with at least submassive pulmonary embolus. 2. Stable scattered lucent and sclerotic osseous lesions throughout the thoracic skeleton, compatible with known metastatic or chronic osseous disease. 3. 1.2 x 1.8 x 1.5 cm left adrenal mass, previously non-hypermetabolic on 12/26/20, favoring benign etiology. 4. Mild  scarring and superimposed atelectasis in the right lower lobe and mild scarring in the left lower lobe. 5. Degenerative arthropathy of both hips with osteochondral lesions posteriorly along the femoral heads and possible small osteochondral fragments in the right acetabular fossa. 6. Simple-appearing bilateral renal cysts and fatty mass of the left quadratus femoris muscle with internal calcification, favoring benign lipoma. 7. Systemic atherosclerosis involving the chest aorta and iliac arteries and prior cholecystectomy. 8. Fiducial markers along the right side of the prostate gland with adjacent low density material along the posterior margin, possibly reflecting hydrogel. Professional radiology assistant personnel have been engaged to place me in direct telephone contact with the referring physician or the referring physician's covering provider. Once that has been established, I will addend this report with documentation of the communication. Electronically signed by: Ryan Salvage MD 01/18/2024 11:15  AM EST RP Workstation: [id number]     .Critical Care  Performed by: Freddi Hamilton, MD Authorized by: Freddi Hamilton, MD   Critical care provider statement:    Critical care time (minutes):  30   Critical care time was exclusive of:  Separately billable procedures and treating other patients   Critical care was necessary to treat or prevent imminent or life-threatening deterioration of the following conditions:  Respiratory failure and cardiac failure   Critical care was time spent personally by me on the following activities:  Development of treatment plan with patient or surrogate, discussions with consultants, evaluation of patient's response to treatment, examination of patient, ordering and review of laboratory studies, ordering and review of radiographic studies, ordering and performing treatments and interventions, pulse oximetry, re-evaluation of patient's condition and review of old  charts    Medications Ordered in the ED  heparin  ADULT infusion 100 units/mL (25000 units/250mL) (1,600 Units/hr Intravenous New Bag/Given 01/18/24 1453)  heparin  bolus via infusion 6,500 Units (6,500 Units Intravenous Bolus from Bag 01/18/24 1453)                                    Medical Decision Making Amount and/or Complexity of Data Reviewed External Data Reviewed: radiology. Labs: ordered.    Details: Mild troponin elevation ECG/medicine tests: ordered and independent interpretation performed.    Details: Sinus tachycardia  Risk Prescription drug management. Decision regarding hospitalization.   Patient is found to have extensive PE on outpatient CT this morning.  I personally viewed/interpreted these images and agree with radiology about bilateral PEs and probable right heart strain.  He was started on heparin .  Fortunately he is not ill-appearing but does have some what is likely compensatory tachycardia.  Otherwise is well-appearing.  Will need admission, discussed with Dr. Claudene.     Final diagnoses:  Acute pulmonary embolism with acute cor pulmonale, unspecified pulmonary embolism type [facility])    ED Discharge Orders     None          Freddi Hamilton, MD 01/18/24 1551  "

## 2024-01-18 NOTE — H&P (Addendum)
 " History and Physical    Patient: Maurice DING Sr. FMW:[id number] DOB: 13-Aug-1950 DOA: 01/18/2024 DOS: the patient was seen and examined on 01/18/2024 PCP: [name], MD  Patient coming from: Home  Chief Complaint:  Chief Complaint  Patient presents with   pulmonary embolism    HPI: Maurice CHAPPUIS Sr. is a 73 y.o. male with medical history significant of hypertension, hyperlipidemia, diabetes mellitus type 2 T-cell lymphoma s/p treatment, prostate cancer, OSA on CPAP presents after having abnormal CT.  He recently underwent a procedure involving Gold seed placement and Hydrogel spacer for prostate cancer on January 05, 2024. He reports that he has not been sitting for prolonged periods since the procedure.  He experiences shortness of breath that began yesterday. No leg swelling or pain reported.  He had follow-up CT scan of the chest abdomen pelvis performed today for restaging along with recent diagnosis of prostate cancer and reports of shortness of breath.  CT revealed extensive filling defects compatible with acute pulmonary emboli in the left main, left upper and lower lobe, right middle lobe, and right lower lobe pulmonary arteries with right ventricular to left ventricular ratio 1.17 concerning for right heart strain.  He was called by his oncologist and advised to come to the hospital for further evaluation.  He has a history of smoking, currently smoking about a pack a day, and uses a CPAP machine at night.   In the emergency department patient was noted to be afebrile with pulse of 118, respirations 21, blood pressure 119/71, and O2 saturation maintained on room air.  Labs significant for BUN 25, creatinine 1.35, glucose 262, and INR 1.1.  Review of Systems: As mentioned in the history of present illness. All other systems reviewed and are negative. Past Medical History:  Diagnosis Date   Arthritis    Cancer (HCC)    Hyperlipidemia    Hypertension    followed by  pcp  (11-23-2019  per pt had stress test greater than 20 yrs ago, told ok)   Nocturia    OSA on CPAP    per pt uses nightly   Phimosis    Sleep apnea    Type 2 diabetes mellitus (HCC)    followed by pcp   (11-23-2019 per pt does not check blood sugar at home)   Wears glasses    Past Surgical History:  Procedure Laterality Date   APPENDECTOMY  child   CHOLECYSTECTOMY N/A 07/09/2020   Procedure: LAPAROSCOPIC CHOLECYSTECTOMY WITH INTRAOPERATIVE CHOLANGIOGRAM,;  Surgeon: Tanda Locus, MD;  Location: [facility];  Service: General;  Laterality: N/A;   CIRCUMCISION N/A 11/29/2019   Procedure: CIRCUMCISION ADULT;  Surgeon: Rosalind Zachary NOVAK, MD;  Location: [facility];  Service: Urology;  Laterality: N/A;   GOLD SEED IMPLANT N/A 01/05/2024   Procedure: INSERTION, GOLD SEEDS;  Surgeon: [name] Lonni Righter, MD;  Location: [facility] OR;  Service: Urology;  Laterality: N/A;  GOLD SEED IMPLANTS AND SPACEOAR   IR IMAGING GUIDED PORT INSERTION  08/06/2020   IR REMOVAL TUN ACCESS W/ PORT W/O FL MOD SED  04/09/2021   LIVER BIOPSY N/A 07/09/2020   Procedure: LAP LIVER BIOPSY;  Surgeon: Tanda Locus, MD;  Location: [facility];  Service: General;  Laterality: N/A;   ROTATOR CUFF REPAIR Left chld   SPACE OAR INSTILLATION N/A 01/05/2024   Procedure: INJECTION, HYDROGEL SPACER;  Surgeon: [name] Lonni Righter, MD;  Location: [facility] OR;  Service: Urology;  Laterality: N/A;   SUPRA-UMBILICAL HERNIA N/A  07/09/2020   Procedure: PRIMARY REPAIR SUPRA-UMBILICAL HERNIA;  Surgeon: Tanda Locus, MD;  Location: [facility];  Service: General;  Laterality: N/A;   Social History:  reports that he has been smoking cigars and cigarettes. He has a 49 pack-year smoking history. He has never used smokeless tobacco. He reports that he does not currently use alcohol . He reports that he does not use drugs.  Allergies[1]  Family History  Problem Relation Age of Onset   Hypotension Mother    Diabetes Mellitus II Father    Colon  cancer Sister    Prostate cancer Brother    Prostate cancer Brother     Prior to Admission medications  Medication Sig Start Date End Date Taking? Authorizing Provider  albuterol  (VENTOLIN  HFA) 108 (90 Base) MCG/ACT inhaler Inhale 2 puffs into the lungs every 4 (four) hours as needed for shortness of breath or wheezing. 07/21/23  Yes [provider]  amLODipine  (NORVASC ) 2.5 MG tablet Take 1 tablet (2.5 mg total) by mouth daily. 07/21/23  Yes   azelastine  (ASTELIN ) 0.1 % nasal spray Place 2 sprays into both nostrils 2 (two) times daily as needed. 02/11/23  Yes   buPROPion  (WELLBUTRIN  XL) 300 MG 24 hr tablet Take 1 tablet (300 mg total) by mouth every morning. 01/12/24  Yes   clopidogrel  (PLAVIX ) 75 MG tablet Take 1 tablet (75 mg total) by mouth daily. 01/12/24  Yes   diclofenac  Sodium (VOLTAREN ) 1 % GEL Apply 4 g topically to the affected area(s) 4 (four) times daily. 06/18/23  Yes   Dulaglutide  (TRULICITY ) 0.75 MG/0.5ML SOAJ Inject 0.75 mg into the skin once a week. 10/09/22  Yes   insulin  glargine (LANTUS  SOLOSTAR) 100 UNIT/ML Solostar Pen Inject 36 Units into the skin daily. 01/12/24  Yes   metFORMIN (GLUCOPHAGE) 1000 MG tablet Take 1,000 mg by mouth 2 (two) times daily. 02/11/23  Yes [provider]  Multiple Vitamins-Minerals (CENTRUM SILVER PO) Take 1 tablet by mouth at bedtime.   Yes [provider]  olopatadine (PATANOL) 0.1 % ophthalmic solution Apply to eye. 07/28/22  Yes [provider]  ORTHOVISC 30 MG/2ML SOSY intra-articular injection  08/08/23  Yes [provider]  rosuvastatin  (CRESTOR ) 10 MG tablet Take 1 tablet (10 mg total) by mouth at bedtime for cholesterol. 01/01/23  Yes   RYBELSUS  7 MG TABS  12/17/21  Yes [provider]  valsartan -hydrochlorothiazide  (DIOVAN -HCT) 320-25 MG tablet Take 1 tablet by mouth daily. 11/26/23  Yes   Albuterol -Budesonide (AIRSUPRA ) 90-80 MCG/ACT AERO Take 2 puffs by mouth every 4 (four) to 6 (six) hours  as needed for shortness of breath. Rinse mouth out with water after each use. Patient not taking: Reported on 01/18/2024 06/25/23     buPROPion  (WELLBUTRIN  XL) 150 MG 24 hr tablet TAKE 1 TABLET BY MOUTH DAILY IN THE MORNING Patient not taking: Reported on 01/18/2024 09/29/23     Continuous Glucose Sensor (FREESTYLE LIBRE 3 PLUS SENSOR) MISC Use as directed. Replace sensor every 15 days 12/23/23     Empagliflozin-metFORMIN HCl (SYNJARDY ) 12.5-500 MG TABS Take 1 tablet by mouth 2 (two) times daily with meals. Patient not taking: Reported on 01/18/2024 01/02/23     insulin  glargine (LANTUS  SOLOSTAR) 100 UNIT/ML Solostar Pen Inject 40 Units into the skin daily. Patient not taking: Reported on 01/18/2024 08/21/23     insulin  glargine (LANTUS  SOLOSTAR) 100 UNIT/ML Solostar Pen Inject 20 Units into the skin daily. Patient not taking: Reported on 01/18/2024 11/26/23     Insulin   Pen Needle 32G X 4 MM MISC Use with Lantus  every day. 03/08/23     Semaglutide ,0.25 or 0.5MG /DOS, (OZEMPIC , 0.25 OR 0.5 MG/DOSE,) 2 MG/3ML SOPN Inject 0.25 mg into the skin every 7 (seven) days. Patient not taking: Reported on 01/18/2024 01/12/24     tamsulosin  (FLOMAX ) 0.4 MG CAPS capsule Take 0.4 mg by mouth daily. Patient not taking: Reported on 01/18/2024 06/06/23   [provider]  tamsulosin  (FLOMAX ) 0.4 MG CAPS capsule Take 1 capsule (0.4 mg total) by mouth daily. Patient not taking: Reported on 01/18/2024 12/04/23       Physical Exam: Vitals:   01/18/24 1257 01/18/24 1400  BP: 119/71   Pulse: (!) 118   Resp: (!) 21   Temp: 98.4 F (36.9 C)   TempSrc: Oral   SpO2: 98%   Weight:  111.1 kg  Height:  5' 10 (1.778 m)   Constitutional: Elderly male currently in no acute distress Eyes: PERRL, lids and conjunctivae normal ENMT: Mucous membranes are moist.  Normal dentition.  Neck: normal, supple  Respiratory: clear to auscultation bilaterally, no wheezing, no crackles. Normal respiratory effort. No accessory  muscle use.  Cardiovascular: Regular rate and rhythm, no murmurs / rubs / gallops.  Trace left leg edema.    Abdomen: no tenderness, no masses palpated.  Bowel sounds positive.  Musculoskeletal: no clubbing / cyanosis. No joint deformity upper and lower extremities. Good ROM, no contractures. Normal muscle tone.  Skin: no rashes, lesions, ulcers. No induration Neurologic: CN 2-12 grossly intact.   Strength 5/5 in all 4.  Psychiatric: Normal judgment and insight. Alert and oriented x 3. Normal mood.   Data Reviewed:  EKG reveals sinus rhythm at 99 bpm reviewed labs, imaging, and pertinent records as documented.  Assessment and Plan:  Pulmonary embolism Acute.  Patient had follow-up CT scan chest abdomen pelvis revealed extensive filling defects compatible with acute pulmonary emboli in the left main, left upper and lower lobe, right middle lobe, and right lower lobe pulmonary arteries with right ventricular to left ventricular ratio 1.17 concerning for right heart strain.  On physical exam patient with some swelling noted on the left lower extremity.  He was started on heparin  drip.  Patient noted to be currently hemodynamically stable.  Risk factors include prostate cancer as well as recent procedure. - Admit to a telemetry bed - Continue heparin  per pharmacy.  Plan to transition to oral coagulation when deemed medically appropriate. - Strict bedrest until able to rule out possibility of DVT - Check Doppler ultrasound of lower extremity  - Check echocardiogram.  Consult to PCCM/IR if found to have concern for significant right heart strain  Elevated troponin Acute.  High-sensitivity troponin mildly elevated at 31. - Continue to monitor  Prostate cancer Patient was diagnosed with grade 2 prostate cancer.  He had gold seed fiduciary marker and SpaceOAR placement with Dr. [name] on 12/16.  He is status post to start radiation treatments in the coming week. - Continue outpatient follow-up  radiation oncology  Uncontrolled diabetes mellitus type 2, with long-term use of insulin  On admission glucose noted to be 262.  Last hemoglobin A1c noted to be 8.9 when checked on 12/16. - Hypoglycemia protocols - Hold metformin and semaglutide  - Continue pharmacy substitution of Semglee  36 units daily. - CBGs before every meal with moderate SSI - Adjust regimen as deemed medically appropriate  Essential hypertension Blood pressures are currently maintained. - Continue amlodipine  - Held valsartan -hydrochlorothiazide   Renal insufficiency Creatinine noted to be  1.35 with BUN 25 baseline creatinine previously noted to be 1-1.2. - Continue to monitor  History of T-cell lymphoma Patient with a history of T-cell lymphoma, status post 6 cycles of BVCHP back in 2022.  Bone marrow biopsy in 2023 showed no evidence of residual disease.  Last CT scan showed no residual disease.  Tobacco abuse Patient reports smoking a pack cigarettes per day on average.  Declined nicotine patch when offered. - Continue to counsel on need of cessation of tobacco use  OSA - Continue CPAP nightly  DVT prophylaxis: Lovenox  Advance Care Planning:   Code Status: Full Code  Consults: None  Family Communication: Son updated at bedside  Severity of Illness: The appropriate patient status for this patient is OBSERVATION. Observation status is judged to be reasonable and necessary in order to provide the required intensity of service to ensure the patient's safety. The patient's presenting symptoms, physical exam findings, and initial radiographic and laboratory data in the context of their medical condition is felt to place them at decreased risk for further clinical deterioration. Furthermore, it is anticipated that the patient will be medically stable for discharge from the hospital within 2 midnights of admission.   Author: Maximino DELENA Sharps, MD 01/18/2024 3:41 PM  For on call review [email or web address].       [1] No Known Allergies  "

## 2024-01-18 NOTE — Progress Notes (Signed)
 PHARMACY - ANTICOAGULATION CONSULT NOTE  Pharmacy Consult for IV heparin  Indication: pulmonary embolus  Allergies[1]  Patient Measurements: Height: 5' 10 (177.8 cm) Weight: 111.1 kg (245 lb) IBW/kg (Calculated) : 73 HEPARIN  DW (KG): 97.2  Vital Signs: Temp: 98.4 F (36.9 C) (12/29 1257) Temp Source: Oral (12/29 1257) BP: 119/71 (12/29 1257) Pulse Rate: 118 (12/29 1257)  Labs: No results for input(s): HGB, HCT, PLT, APTT, LABPROT, INR, HEPARINUNFRC, HEPRLOWMOCWT, CREATININE, CKTOTAL, CKMB, TROPONINIHS in the last 72 hours.  Estimated Creatinine Clearance: 75.3 mL/min (by C-G formula based on SCr of 1.09 mg/dL).   Medical History: Past Medical History:  Diagnosis Date   Arthritis    Cancer (HCC)    Hyperlipidemia    Hypertension    followed by pcp  (11-23-2019  per pt had stress test greater than 20 yrs ago, told ok)   Nocturia    OSA on CPAP    per pt uses nightly   Phimosis    Sleep apnea    Type 2 diabetes mellitus (HCC)    followed by pcp   (11-23-2019 per pt does not check blood sugar at home)   Wears glasses    Assessment: Maurice EVETTS Sr. is a 73 y.o. year old male admitted on 01/18/2024 with concern for shortness of breath found to have PE. CTA with extensive clot burden noted and RHS present.No anticoagulation prior to admission. Pharmacy consulted to dose heparin .  Goal of Therapy:  Heparin  level 0.3-0.7 units/ml Monitor platelets by anticoagulation protocol: Yes   Plan:  Heparin  6500 units x 1 as bolus followed by heparin  infusion at 1600 units/hr 6h heparin  level  Daily heparin  level, CBC, and monitoring for bleeding F/u plans for anticoagulation   Thank you for allowing pharmacy to participate in this patient's care.  Leonor GORMAN Bash, PharmD Emergency Medicine Clinical Pharmacist 01/18/2024,2:19 PM      [1] No Known Allergies

## 2024-01-18 NOTE — Telephone Encounter (Signed)
 Pharmacy Patient Advocate Encounter  Insurance verification completed.    The patient is insured through [employer]. Patient has Medicare and is not eligible for a copay card, but may be able to apply for patient assistance or Medicare RX Payment Plan (Patient Must reach out to their plan, if eligible for payment plan), if available.    Ran test claim for Eliquis 5mg  tablet and the current 30 day co-pay is $47.  Ran test claim for Xarelto 20mg  tablet and the current 30 day co-pay is $47.  This test claim was processed through [employer]- copay amounts may vary at other pharmacies due to [employer], or as the patient moves through the different stages of their insurance plan.

## 2024-01-19 ENCOUNTER — Ambulatory Visit

## 2024-01-19 ENCOUNTER — Observation Stay (HOSPITAL_COMMUNITY)

## 2024-01-19 ENCOUNTER — Telehealth: Payer: Self-pay

## 2024-01-19 DIAGNOSIS — I2609 Other pulmonary embolism with acute cor pulmonale: Secondary | ICD-10-CM | POA: Diagnosis present

## 2024-01-19 DIAGNOSIS — Z7984 Long term (current) use of oral hypoglycemic drugs: Secondary | ICD-10-CM | POA: Diagnosis not present

## 2024-01-19 DIAGNOSIS — Z7901 Long term (current) use of anticoagulants: Secondary | ICD-10-CM | POA: Diagnosis not present

## 2024-01-19 DIAGNOSIS — E785 Hyperlipidemia, unspecified: Secondary | ICD-10-CM | POA: Diagnosis present

## 2024-01-19 DIAGNOSIS — I82492 Acute embolism and thrombosis of other specified deep vein of left lower extremity: Secondary | ICD-10-CM | POA: Diagnosis not present

## 2024-01-19 DIAGNOSIS — Z7985 Long-term (current) use of injectable non-insulin antidiabetic drugs: Secondary | ICD-10-CM | POA: Diagnosis not present

## 2024-01-19 DIAGNOSIS — Z8042 Family history of malignant neoplasm of prostate: Secondary | ICD-10-CM | POA: Diagnosis not present

## 2024-01-19 DIAGNOSIS — I82442 Acute embolism and thrombosis of left tibial vein: Secondary | ICD-10-CM | POA: Diagnosis present

## 2024-01-19 DIAGNOSIS — Z800 Family history of malignant neoplasm of digestive organs: Secondary | ICD-10-CM | POA: Diagnosis not present

## 2024-01-19 DIAGNOSIS — I10 Essential (primary) hypertension: Secondary | ICD-10-CM | POA: Diagnosis present

## 2024-01-19 DIAGNOSIS — Z79899 Other long term (current) drug therapy: Secondary | ICD-10-CM | POA: Diagnosis not present

## 2024-01-19 DIAGNOSIS — E1165 Type 2 diabetes mellitus with hyperglycemia: Secondary | ICD-10-CM | POA: Diagnosis present

## 2024-01-19 DIAGNOSIS — F17290 Nicotine dependence, other tobacco product, uncomplicated: Secondary | ICD-10-CM | POA: Diagnosis present

## 2024-01-19 DIAGNOSIS — G4733 Obstructive sleep apnea (adult) (pediatric): Secondary | ICD-10-CM | POA: Diagnosis present

## 2024-01-19 DIAGNOSIS — I82409 Acute embolism and thrombosis of unspecified deep veins of unspecified lower extremity: Secondary | ICD-10-CM

## 2024-01-19 DIAGNOSIS — I82432 Acute embolism and thrombosis of left popliteal vein: Secondary | ICD-10-CM | POA: Diagnosis present

## 2024-01-19 DIAGNOSIS — I82412 Acute embolism and thrombosis of left femoral vein: Secondary | ICD-10-CM | POA: Diagnosis present

## 2024-01-19 DIAGNOSIS — C61 Malignant neoplasm of prostate: Secondary | ICD-10-CM | POA: Diagnosis present

## 2024-01-19 DIAGNOSIS — Z86711 Personal history of pulmonary embolism: Secondary | ICD-10-CM | POA: Diagnosis not present

## 2024-01-19 DIAGNOSIS — I824Y2 Acute embolism and thrombosis of unspecified deep veins of left proximal lower extremity: Secondary | ICD-10-CM

## 2024-01-19 DIAGNOSIS — Z7902 Long term (current) use of antithrombotics/antiplatelets: Secondary | ICD-10-CM | POA: Diagnosis not present

## 2024-01-19 DIAGNOSIS — Z794 Long term (current) use of insulin: Secondary | ICD-10-CM | POA: Diagnosis not present

## 2024-01-19 DIAGNOSIS — I2699 Other pulmonary embolism without acute cor pulmonale: Secondary | ICD-10-CM | POA: Diagnosis present

## 2024-01-19 DIAGNOSIS — I82452 Acute embolism and thrombosis of left peroneal vein: Secondary | ICD-10-CM | POA: Diagnosis present

## 2024-01-19 DIAGNOSIS — Z8572 Personal history of non-Hodgkin lymphomas: Secondary | ICD-10-CM | POA: Diagnosis not present

## 2024-01-19 DIAGNOSIS — Z833 Family history of diabetes mellitus: Secondary | ICD-10-CM | POA: Diagnosis not present

## 2024-01-19 LAB — BASIC METABOLIC PANEL WITH GFR
Anion gap: 10 (ref 5–15)
BUN: 21 mg/dL (ref 8–23)
CO2: 23 mmol/L (ref 22–32)
Calcium: 9.1 mg/dL (ref 8.9–10.3)
Chloride: 104 mmol/L (ref 98–111)
Creatinine, Ser: 1.19 mg/dL (ref 0.61–1.24)
GFR, Estimated: >60 mL/min
Glucose, Bld: 218 mg/dL — ABNORMAL HIGH (ref 70–99)
Potassium: 4.2 mmol/L (ref 3.5–5.1)
Sodium: 137 mmol/L (ref 135–145)

## 2024-01-19 LAB — CBC
HCT: 39.9 % (ref 39.0–52.0)
Hemoglobin: 13.4 g/dL (ref 13.0–17.0)
MCH: 29.1 pg (ref 26.0–34.0)
MCHC: 33.6 g/dL (ref 30.0–36.0)
MCV: 86.7 fL (ref 80.0–100.0)
Platelets: 230 K/uL (ref 150–400)
RBC: 4.6 MIL/uL (ref 4.22–5.81)
RDW: 14.1 % (ref 11.5–15.5)
WBC: 5.8 K/uL (ref 4.0–10.5)
nRBC: 0 % (ref 0.0–0.2)

## 2024-01-19 LAB — HEPARIN LEVEL (UNFRACTIONATED): Heparin Unfractionated: 0.65 [IU]/mL (ref 0.30–0.70)

## 2024-01-19 LAB — ECHOCARDIOGRAM COMPLETE
Area-P 1/2: 6.9 cm2
Calc EF: 56.3 %
Height: 70 in
S' Lateral: 2.9 cm
Single Plane A2C EF: 57.7 %
Single Plane A4C EF: 55.3 %
Weight: 3920 [oz_av]

## 2024-01-19 LAB — GLUCOSE, CAPILLARY
Glucose-Capillary: 161 mg/dL — ABNORMAL HIGH (ref 70–99)
Glucose-Capillary: 163 mg/dL — ABNORMAL HIGH (ref 70–99)

## 2024-01-19 LAB — CBG MONITORING, ED: Glucose-Capillary: 223 mg/dL — ABNORMAL HIGH (ref 70–99)

## 2024-01-19 MED ORDER — INSULIN ASPART 100 UNIT/ML IJ SOLN
0.0000 [IU] | Freq: Three times a day (TID) | INTRAMUSCULAR | Status: DC
  Administered 2024-01-19 – 2024-01-20 (×2): 2 [IU] via SUBCUTANEOUS
  Administered 2024-01-20 (×2): 3 [IU] via SUBCUTANEOUS
  Administered 2024-01-20 – 2024-01-21 (×2): 1 [IU] via SUBCUTANEOUS
  Filled 2024-01-19: qty 2
  Filled 2024-01-19: qty 3
  Filled 2024-01-19: qty 1
  Filled 2024-01-19: qty 3
  Filled 2024-01-19: qty 2

## 2024-01-19 NOTE — Care Management Obs Status (Signed)
 MEDICARE OBSERVATION STATUS NOTIFICATION   Patient Details  Name: Maurice GARCIALOPEZ Sr. MRN: [id number] Date of Birth: [birth year]   Medicare Observation Status Notification Given:  Yes    Debarah Saunas, RN 01/19/2024, 2:04 PM

## 2024-01-19 NOTE — Progress Notes (Signed)
 PHARMACY - ANTICOAGULATION CONSULT NOTE  Pharmacy Consult for IV heparin  Indication: pulmonary embolus  Allergies[1]  Patient Measurements: Height: 5' 10 (177.8 cm) Weight: 111.1 kg (245 lb) IBW/kg (Calculated) : 73 HEPARIN  DW (KG): 97.2  Vital Signs: Temp: 97.5 F (36.4 C) (12/30 0620) Temp Source: Oral (12/30 0620) BP: 131/78 (12/30 0620) Pulse Rate: 81 (12/30 0620)  Labs: Recent Labs    01/18/24 1435 01/18/24 2329 01/19/24 0549  HGB 13.6  --  13.4  HCT 40.9  --  39.9  PLT 215  --  230  APTT 37*  --   --   LABPROT 14.7  --   --   INR 1.1  --   --   HEPARINUNFRC  --  0.65 0.65  CREATININE 1.35*  --  1.19    Estimated Creatinine Clearance: 69 mL/min (by C-G formula based on SCr of 1.19 mg/dL).   Medical History: Past Medical History:  Diagnosis Date   Arthritis    Cancer (HCC)    Hyperlipidemia    Hypertension    followed by pcp  (11-23-2019  per pt had stress test greater than 20 yrs ago, told ok)   Nocturia    OSA on CPAP    per pt uses nightly   Phimosis    Sleep apnea    Type 2 diabetes mellitus (HCC)    followed by pcp   (11-23-2019 per pt does not check blood sugar at home)   Wears glasses    Assessment: Maurice Fox Sr. is a 73 y.o. year old male admitted on 01/18/2024 with concern for shortness of breath found to have PE. CTA with extensive clot burden noted and RHS present.No anticoagulation prior to admission. Pharmacy consulted to dose heparin .  Heparin  level 0.65, therapeutic  No overt s/sx of bleeding - cbc stable. No issues with infusion noted.   Goal of Therapy:  Heparin  level 0.3-0.7 units/ml Monitor platelets by anticoagulation protocol: Yes   Plan:  Continue heparin  infusion at 1600 units/hr Daily heparin  level, CBC, and monitoring for bleeding F/u plans for anticoagulation  F/u echo results  Thank you for allowing pharmacy to participate in this patient's care.  Leonor GORMAN Bash, PharmD Emergency Medicine Clinical  Pharmacist 01/19/2024,7:14 AM       [1] No Known Allergies

## 2024-01-19 NOTE — Progress Notes (Signed)
2D echo complete 

## 2024-01-19 NOTE — Progress Notes (Signed)
 PHARMACY - ANTICOAGULATION Pharmacy Consult for heparin  Indication: pulmonary embolus Brief A/P: Heparin  level within goal range Continue Heparin  at current rate   Allergies[1]  Patient Measurements: Height: 5' 10 (177.8 cm) Weight: 111.1 kg (245 lb) IBW/kg (Calculated) : 73 HEPARIN  DW (KG): 97.2  Vital Signs: Temp: 98.3 F (36.8 C) (12/29 1844) Temp Source: Oral (12/29 1844) BP: 180/79 (12/29 2328) Pulse Rate: 85 (12/29 2328)  Labs: Recent Labs    01/18/24 1435 01/18/24 2329  HGB 13.6  --   HCT 40.9  --   PLT 215  --   APTT 37*  --   LABPROT 14.7  --   INR 1.1  --   HEPARINUNFRC  --  0.65  CREATININE 1.35*  --     Estimated Creatinine Clearance: 60.8 mL/min (A) (by C-G formula based on SCr of 1.35 mg/dL (H)).  Assessment: 73 y.o. male with PE for heparin   Goal of Therapy:  Heparin  level 0.3-0.7 units/ml Monitor platelets by anticoagulation protocol: Yes   Plan:  No change to heparin  Follow-up am labs.  Cathlyn Arrant, PharmD, BCPS   01/19/2024,12:13 AM       [1] No Known Allergies

## 2024-01-19 NOTE — Discharge Planning (Signed)
 Transition of Care [facility]) - Inpatient Brief Assessment   Patient Details  Name: Maurice TUCH Sr. MRN: [id number] Date of Birth: September 27, 1950  Transition of Care [facility]) CM/SW Contact:    Debarah Saunas, RN Phone Number: 01/19/2024, 2:10 PM   Clinical Narrative:  Transition of Care Department [facility]) has reviewed patient and no TOC needs have been identified at this time. We will continue to monitor patient advancement through interdisciplinary progression rounds. If new patient transition needs arise, please place a TOC consult.     Transition of Care Asessment: Insurance and Status: Insurance coverage has been reviewed Patient has primary care physician: Yes Home environment has been reviewed: from home alone (apartment) Prior level of function:: independent, has CPAP Prior/Current Home Services: No current home services Social Drivers of Health Review: SDOH reviewed needs interventions Readmission risk has been reviewed: Yes Transition of care needs: no transition of care needs at this time

## 2024-01-19 NOTE — ED Notes (Signed)
Patient ambulate to the bathroom.

## 2024-01-19 NOTE — Hospital Course (Signed)
 Maurice VEAR Schaffer Sr. is a 73 y.o. male with medical history significant of hypertension, hyperlipidemia, diabetes mellitus type 2, T-cell lymphoma s/p treatment, prostate cancer, OSA on CPAP presented after having abnormal CT.   He recently underwent a procedure involving Gold seed placement and Hydrogel spacer for prostate cancer on January 05, 2024.   He experiences shortness of breath that began just prior to admission. No leg swelling or pain reported.  He had follow-up CT scan of the chest abdomen pelvis performed for restaging along with recent diagnosis of prostate cancer and reports of shortness of breath.  CT revealed extensive filling defects compatible with acute pulmonary emboli in the left main, left upper and lower lobe, right middle lobe, and right lower lobe pulmonary arteries with right ventricular to left ventricular ratio 1.17 concerning for right heart strain.   He was called by his oncologist and advised to come to the hospital for further evaluation.  He has a history of smoking, currently smoking about a pack a day, and uses a CPAP machine at night.     In the emergency department patient was noted to be afebrile with pulse of 118, respirations 21, blood pressure 119/71, and O2 saturation maintained on room air.  Labs significant for BUN 25, creatinine 1.35, glucose 262, and INR 1.1.   Assessment and Plan:   Acute PE  LLE DVT - provoked in setting of malignancy and some immobility post surgery  -  left main, left upper and lower lobe, right middle lobe, and right lower lobe - continue heparin  drip thru today; likely can transition to DOAC tomorrow - RLE duplex negative - LLE DVT involving left femoral vein, popliteal vein, posterior tibial vein, peroneal vein - follow up echo; still pending  Acute superficial vein thrombosis - Left small saphenous vein - asymptomatic    Elevated troponin Acute.  No CP. Presumed demand in setting of PE - Continue to monitor    Prostate cancer Patient was diagnosed with grade 2 prostate cancer.  He had gold seed fiduciary marker and SpaceOAR placement with Dr. [name] on 12/16.  He is status post to start radiation treatments in the coming week. - Continue outpatient follow-up radiation oncology   Uncontrolled diabetes mellitus type 2, with long-term use of insulin  Last hemoglobin A1c noted to be 8.9 when checked on 12/16. - continue lantus  and SSI    Essential hypertension Blood pressures are currently maintained. - Continue amlodipine  - Held valsartan -hydrochlorothiazide    History of T-cell lymphoma Patient with a history of T-cell lymphoma, status post 6 cycles of BVCHP back in 2022.  Bone marrow biopsy in 2023 showed no evidence of residual disease.  Last CT scan showed no residual disease.   Tobacco abuse Patient reports smoking a pack cigarettes per day on average.  Declined nicotine patch when offered. - Continue to counsel on need of cessation of tobacco use   OSA - Continue CPAP nightly

## 2024-01-19 NOTE — Progress Notes (Signed)
 " Progress Note    Maurice HEHIR Sr.   FMW:[id number]  DOB: January 14, 1951  DOA: 01/18/2024     0 PCP: [name], MD  Initial CC: Abnormal CT  Hospital Course: Maurice VEAR Schaffer Sr. is a 73 y.o. male with medical history significant of hypertension, hyperlipidemia, diabetes mellitus type 2, T-cell lymphoma s/p treatment, prostate cancer, OSA on CPAP presented after having abnormal CT.   He recently underwent a procedure involving Gold seed placement and Hydrogel spacer for prostate cancer on January 05, 2024.   He experiences shortness of breath that began just prior to admission. No leg swelling or pain reported.  He had follow-up CT scan of the chest abdomen pelvis performed for restaging along with recent diagnosis of prostate cancer and reports of shortness of breath.  CT revealed extensive filling defects compatible with acute pulmonary emboli in the left main, left upper and lower lobe, right middle lobe, and right lower lobe pulmonary arteries with right ventricular to left ventricular ratio 1.17 concerning for right heart strain.   He was called by his oncologist and advised to come to the hospital for further evaluation.  He has a history of smoking, currently smoking about a pack a day, and uses a CPAP machine at night.     In the emergency department patient was noted to be afebrile with pulse of 118, respirations 21, blood pressure 119/71, and O2 saturation maintained on room air.  Labs significant for BUN 25, creatinine 1.35, glucose 262, and INR 1.1.   Assessment and Plan:   Acute PE  LLE DVT - provoked in setting of malignancy and some immobility post surgery  -  left main, left upper and lower lobe, right middle lobe, and right lower lobe - continue heparin  drip thru today; likely can transition to DOAC tomorrow - RLE duplex negative - LLE DVT involving left femoral vein, popliteal vein, posterior tibial vein, peroneal vein - follow up echo; still pending  Acute  superficial vein thrombosis - Left small saphenous vein - asymptomatic    Elevated troponin Acute.  No CP. Presumed demand in setting of PE - Continue to monitor   Prostate cancer Patient was diagnosed with grade 2 prostate cancer.  He had gold seed fiduciary marker and SpaceOAR placement with Dr. [name] on 12/16.  He is status post to start radiation treatments in the coming week. - Continue outpatient follow-up radiation oncology   Uncontrolled diabetes mellitus type 2, with long-term use of insulin  Last hemoglobin A1c noted to be 8.9 when checked on 12/16. - continue lantus  and SSI    Essential hypertension Blood pressures are currently maintained. - Continue amlodipine  - Held valsartan -hydrochlorothiazide    History of T-cell lymphoma Patient with a history of T-cell lymphoma, status post 6 cycles of BVCHP back in 2022.  Bone marrow biopsy in 2023 showed no evidence of residual disease.  Last CT scan showed no residual disease.   Tobacco abuse Patient reports smoking a pack cigarettes per day on average.  Declined nicotine patch when offered. - Continue to counsel on need of cessation of tobacco use   OSA - Continue CPAP nightly  Interval History:  No events overnight.  Seen in the ER this morning.  Denies any pleuritic pains or shortness of breath.  Frustrated on waiting on bed upstairs.   Antimicrobials:   DVT prophylaxis:  Heparin  drip    Code Status:   Code Status: Full Code  Mobility Assessment (Last 72 Hours)  Mobility Assessment     Row Name 01/19/24 1628           Does the patient have exclusion criteria? No- Perform mobility assessment       What is the highest level of mobility based on the mobility assessment? Level 5 (Ambulates independently) - Balance while walking independently - Complete          Diet: Diet Orders (From admission, onward)     Start     Ordered   01/18/24 1620  Diet Carb Modified  Diet effective now       Question  Answer Comment  Calorie Level Medium 1600-2000   Fluid consistency: Thin      01/18/24 1619            Barriers to discharge: none Disposition Plan:  Home  HH orders placed: n/a Status is: Inpt  Objective: Blood pressure 126/78, pulse 99, temperature 98 F (36.7 C), temperature source Oral, resp. rate 20, height 5' 10 (1.778 m), weight 111.1 kg, SpO2 99%.  Examination:  Physical Exam Constitutional:      General: He is not in acute distress.    Appearance: Normal appearance.  HENT:     Head: Normocephalic and atraumatic.     Mouth/Throat:     Mouth: Mucous membranes are moist.  Eyes:     Extraocular Movements: Extraocular movements intact.  Cardiovascular:     Rate and Rhythm: Normal rate and regular rhythm.  Pulmonary:     Effort: Pulmonary effort is normal. No respiratory distress.     Breath sounds: Normal breath sounds. No wheezing.  Abdominal:     General: Bowel sounds are normal. There is no distension.     Palpations: Abdomen is soft.     Tenderness: There is no abdominal tenderness.  Musculoskeletal:        General: Normal range of motion.     Cervical back: Normal range of motion and neck supple.  Skin:    General: Skin is warm and dry.  Neurological:     General: No focal deficit present.     Mental Status: He is alert.  Psychiatric:        Mood and Affect: Mood normal.        Behavior: Behavior normal.      Consultants:    Procedures:    Data Reviewed: Results for orders placed or performed during the hospital encounter of 01/18/24 (from the past 24 hours)  Heparin  level (unfractionated)     Status: None   Collection Time: 01/18/24 11:29 PM  Result Value Ref Range   Heparin  Unfractionated 0.65 0.30 - 0.70 IU/mL  Heparin  level (unfractionated)     Status: None   Collection Time: 01/19/24  5:49 AM  Result Value Ref Range   Heparin  Unfractionated 0.65 0.30 - 0.70 IU/mL  CBC     Status: None   Collection Time: 01/19/24  5:49 AM  Result  Value Ref Range   WBC 5.8 4.0 - 10.5 K/uL   RBC 4.60 4.22 - 5.81 MIL/uL   Hemoglobin 13.4 13.0 - 17.0 g/dL   HCT 60.0 60.9 - 47.9 %   MCV 86.7 80.0 - 100.0 fL   MCH 29.1 26.0 - 34.0 pg   MCHC 33.6 30.0 - 36.0 g/dL   RDW 85.8 88.4 - 84.4 %   Platelets 230 150 - 400 K/uL   nRBC 0.0 0.0 - 0.2 %  Basic metabolic panel     Status: Abnormal  Collection Time: 01/19/24  5:49 AM  Result Value Ref Range   Sodium 137 135 - 145 mmol/L   Potassium 4.2 3.5 - 5.1 mmol/L   Chloride 104 98 - 111 mmol/L   CO2 23 22 - 32 mmol/L   Glucose, Bld 218 (H) 70 - 99 mg/dL   BUN 21 8 - 23 mg/dL   Creatinine, Ser 8.80 0.61 - 1.24 mg/dL   Calcium  9.1 8.9 - 10.3 mg/dL   GFR, Estimated >39 >39 mL/min   Anion gap 10 5 - 15  CBG monitoring, ED     Status: Abnormal   Collection Time: 01/19/24 10:16 AM  Result Value Ref Range   Glucose-Capillary 223 (H) 70 - 99 mg/dL  Glucose, capillary     Status: Abnormal   Collection Time: 01/19/24  4:37 PM  Result Value Ref Range   Glucose-Capillary 161 (H) 70 - 99 mg/dL    I have reviewed pertinent nursing notes, vitals, labs, and images as necessary. I have ordered labwork to follow up on as indicated.  I have reviewed the last notes from staff over past 24 hours. I have discussed patient's care plan and test results with nursing staff, CM/SW, and other staff as appropriate.  Old records reviewed in assessment of this patient  Time spent: Greater than 50% of the 55 minute visit was spent in counseling/coordination of care for the patient as laid out in the A&P.   LOS: 0 days   Alm Apo, MD [facility] 01/19/2024, 4:46 PM "

## 2024-01-19 NOTE — Telephone Encounter (Signed)
 Spoke with patient who had left a message that he is in the [facility] waiting for a room and hopes Dr Federico could help him get a room quickly. Pt reported that he is being taken care of and appreciated his call returned.

## 2024-01-19 NOTE — Progress Notes (Signed)
 Venous duplex lower ext  has been completed. Refer to [facility] under chart review to view preliminary results.   01/19/2024  11:49 AM Damiel Barthold, Ricka BIRCH

## 2024-01-20 ENCOUNTER — Ambulatory Visit

## 2024-01-20 ENCOUNTER — Telehealth: Payer: Self-pay

## 2024-01-20 DIAGNOSIS — I82492 Acute embolism and thrombosis of other specified deep vein of left lower extremity: Secondary | ICD-10-CM

## 2024-01-20 DIAGNOSIS — I2699 Other pulmonary embolism without acute cor pulmonale: Secondary | ICD-10-CM | POA: Diagnosis not present

## 2024-01-20 LAB — CBC
HCT: 38.8 % — ABNORMAL LOW (ref 39.0–52.0)
Hemoglobin: 13.1 g/dL (ref 13.0–17.0)
MCH: 29 pg (ref 26.0–34.0)
MCHC: 33.8 g/dL (ref 30.0–36.0)
MCV: 86 fL (ref 80.0–100.0)
Platelets: 218 K/uL (ref 150–400)
RBC: 4.51 MIL/uL (ref 4.22–5.81)
RDW: 13.9 % (ref 11.5–15.5)
WBC: 8.4 K/uL (ref 4.0–10.5)
nRBC: 0 % (ref 0.0–0.2)

## 2024-01-20 LAB — HEPARIN LEVEL (UNFRACTIONATED): Heparin Unfractionated: 0.54 [IU]/mL (ref 0.30–0.70)

## 2024-01-20 LAB — GLUCOSE, CAPILLARY
Glucose-Capillary: 133 mg/dL — ABNORMAL HIGH (ref 70–99)
Glucose-Capillary: 153 mg/dL — ABNORMAL HIGH (ref 70–99)
Glucose-Capillary: 153 mg/dL — ABNORMAL HIGH (ref 70–99)
Glucose-Capillary: 160 mg/dL — ABNORMAL HIGH (ref 70–99)
Glucose-Capillary: 205 mg/dL — ABNORMAL HIGH (ref 70–99)
Glucose-Capillary: 227 mg/dL — ABNORMAL HIGH (ref 70–99)
Glucose-Capillary: 69 mg/dL — ABNORMAL LOW (ref 70–99)

## 2024-01-20 LAB — TROPONIN T, HIGH SENSITIVITY: Troponin T High Sensitivity: 22 ng/L — ABNORMAL HIGH (ref 0–19)

## 2024-01-20 LAB — PRO BRAIN NATRIURETIC PEPTIDE: Pro Brain Natriuretic Peptide: 136 pg/mL

## 2024-01-20 LAB — LACTIC ACID, PLASMA: Lactic Acid, Venous: 1.2 mmol/L (ref 0.5–1.9)

## 2024-01-20 NOTE — Progress Notes (Signed)
 " Progress Note    Maurice FANCHER Sr.   FMW:[id number]  DOB: 1950/06/19  DOA: 01/18/2024     1 PCP: [name], MD  Initial CC: Abnormal CT  Hospital Course: Maurice VEAR Schaffer Sr. is a 73 y.o. male with medical history significant of hypertension, hyperlipidemia, diabetes mellitus type 2, T-cell lymphoma s/p treatment, prostate cancer, OSA on CPAP presented after having abnormal CT.   He recently underwent a procedure involving Gold seed placement and Hydrogel spacer for prostate cancer on January 05, 2024.   He experiences shortness of breath that began just prior to admission. No leg swelling or pain reported.  He had follow-up CT scan of the chest abdomen pelvis performed for restaging along with recent diagnosis of prostate cancer and reports of shortness of breath.  CT revealed extensive filling defects compatible with acute pulmonary emboli in the left main, left upper and lower lobe, right middle lobe, and right lower lobe pulmonary arteries with right ventricular to left ventricular ratio 1.17 concerning for right heart strain.   He was called by his oncologist and advised to come to the hospital for further evaluation.  He has a history of smoking, currently smoking about a pack a day, and uses a CPAP machine at night.     In the emergency department patient was noted to be afebrile with pulse of 118, respirations 21, blood pressure 119/71, and O2 saturation maintained on room air.  Labs significant for BUN 25, creatinine 1.35, glucose 262, and INR 1.1.   Assessment and Plan:   Acute PE and acute left lower extremity DVT. LLE DVT  - provoked in setting of malignancy and some immobility post surgery, PE is large with significant left lower extremity DVT as well, echocardiogram noted with some evidence of RV strain, currently on heparin  drip, thankfully hemodynamically stable and relatively symptom-free, will obtain PCCM input question if he requires thrombectomy etc.  Plan  will be eventually to transition him to Eliquis with outpatient follow-up with PCP.  Acute superficial vein thrombosis - Left small saphenous vein - asymptomatic    Elevated troponin Acute.  No CP. Presumed demand in setting of PE - Continue to monitor   Prostate cancer Patient was diagnosed with grade 2 prostate cancer.  He had gold seed fiduciary marker and SpaceOAR placement with Dr. [name] on 12/16.  He is status post to start radiation treatments in the coming week.  Postdischarge follow-up with Dr. [name] and his oncologist.   Essential hypertension Blood pressures are currently maintained. - Continue amlodipine  - Held valsartan -hydrochlorothiazide    History of T-cell lymphoma Patient with a history of T-cell lymphoma, status post 6 cycles of BVCHP back in 2022.  Bone marrow biopsy in 2023 showed no evidence of residual disease.  Last CT scan showed no residual disease.   Tobacco abuse Patient reports smoking a pack cigarettes per day on average.  Declined nicotine patch when offered. - Continue to counsel on need of cessation of tobacco use   OSA - Continue CPAP nightly  Uncontrolled diabetes mellitus type 2, with long-term use of insulin  Last hemoglobin A1c noted to be 8.9 when checked on 12/16. - continue lantus  and SSI   CBG (last 3)  Recent Labs    01/20/24 0126 01/20/24 0640 01/20/24 0822  GLUCAP 153* 133* 153*   Lab Results  Component Value Date   HGBA1C 8.9 (H) 01/05/2024      Interval History:  Patient in bed, appears comfortable, denies any headache,  no fever, no chest pain or pressure, no shortness of breath , no abdominal pain. No new focal weakness.   Antimicrobials:   Code Status:   Code Status: Full Code  Diet: Diet Orders (From admission, onward)     Start     Ordered   01/18/24 1620  Diet Carb Modified  Diet effective now       Question Answer Comment  Calorie Level Medium 1600-2000   Fluid consistency: Thin      01/18/24 1619             Barriers to discharge: none Disposition Plan:  Home  HH orders placed: n/a Status is: Inpt  Objective: Blood pressure 97/62, pulse 74, temperature (!) 97.4 F (36.3 C), temperature source Oral, resp. rate 17, height 5' 10 (1.778 m), weight 111.1 kg, SpO2 98%.   Examination:    Awake Alert, No new F.N deficits, Normal affect [place].AT,PERRAL Supple Neck, No JVD,   Symmetrical Chest wall movement, Good air movement bilaterally, CTAB RRR,No Gallops, Rubs or new Murmurs,  +ve B.Sounds, Abd Soft, No tenderness,   No Cyanosis, Clubbing or edema    Consultants:  PCCM    Data Review:   Patient Lines/Drains/Airways Status     Active Line/Drains/Airways     Name Placement date Placement time Site Days   Peripheral IV 01/18/24 20 G 1 Posterior;Right Hand 01/18/24  1422  Hand  2   Wound 01/05/24 0819 Surgical Closed Surgical Incision Perineum Other (Comment) 01/05/24  0819  Perineum  15             Inpatient Medications  Scheduled Meds:  amLODipine   2.5 mg Oral Daily   buPROPion   300 mg Oral q morning   diclofenac  Sodium  4 g Topical QID   insulin  aspart  0-9 Units Subcutaneous TID AC & HS   insulin  glargine  36 Units Subcutaneous Daily   rosuvastatin   10 mg Oral QHS   sodium chloride  flush  3 mL Intravenous Q12H   Continuous Infusions:  heparin  1,600 Units/hr (01/19/24 1644)   PRN Meds:.acetaminophen  **OR** acetaminophen , albuterol , azelastine     Recent Labs  Lab 01/18/24 1435 01/19/24 0549 01/20/24 0322  WBC 6.7 5.8 8.4  HGB 13.6 13.4 13.1  HCT 40.9 39.9 38.8*  PLT 215 230 218  MCV 88.5 86.7 86.0  MCH 29.4 29.1 29.0  MCHC 33.3 33.6 33.8  RDW 14.0 14.1 13.9    Recent Labs  Lab 01/18/24 1435 01/18/24 1439 01/19/24 0549  NA 131*  --  137  K 4.5  --  4.2  CL 99  --  104  CO2 19*  --  23  ANIONGAP 12  --  10  GLUCOSE 262*  --  218*  BUN 25*  --  21  CREATININE 1.35*  --  1.19  LATICACIDVEN  --  1.3  --   INR 1.1  --   --    CALCIUM  9.0  --  9.1      Recent Labs  Lab 01/18/24 1435 01/18/24 1439 01/19/24 0549  LATICACIDVEN  --  1.3  --   INR 1.1  --   --   CALCIUM  9.0  --  9.1    --------------------------------------------------------------------------------------------------------------- No results found for: CHOL, HDL, LDLCALC, LDLDIRECT, TRIG, CHOLHDL  Lab Results  Component Value Date   HGBA1C 8.9 (H) 01/05/2024   No results for input(s): TSH, T4TOTAL, FREET4, T3FREE, THYROIDAB in the last 72 hours. No results for input(s): VITAMINB12, FOLATE, FERRITIN,  TIBC, IRON , RETICCTPCT in the last 72 hours. ------------------------------------------------------------------------------------------------------------------ Cardiac Enzymes No results for input(s): CKMB, TROPONINI, MYOGLOBIN in the last 168 hours.  Invalid input(s): CK  Micro Results No results found for this or any previous visit (from the past 240 hours).  Radiology Reports  ECHOCARDIOGRAM COMPLETE Result Date: 01/19/2024    ECHOCARDIOGRAM REPORT   Patient Name:   TITAN KARNER Sr. Date of Exam: 01/19/2024 Medical Rec #:  [id number]           Height:       70.0 in Accession #:    [id number]          Weight:       245.0 lb Date of Birth:  [birth year]           BSA:          2.275 m Patient Age:    73 years            BP:           127/85 mmHg Patient Gender: M                   HR:           91 bpm. Exam Location:  Inpatient Procedure: 2D Echo, Color Doppler and Cardiac Doppler (Both Spectral and Color            Flow Doppler were utilized during procedure). Indications:    Pulmonary Embolus I26.09  History:        Patient has no prior history of Echocardiogram examinations.                 Risk Factors:Hypertension and Diabetes.  Sonographer:    Sydnee Wilson RDCS Referring Phys: MAXIMINO LABOR SMITH IMPRESSIONS  1. Left ventricular ejection fraction, by estimation, is 60 to 65%. The left ventricle has  normal function. The left ventricle has no regional wall motion abnormalities. There is mild concentric left ventricular hypertrophy. Left ventricular diastolic parameters are consistent with Grade I diastolic dysfunction (impaired relaxation).  2. The mid RV free wall appears severely hypokinetic to akinetic with preserved RV apical function, McConnell's sign appears to be present and is concerning for PE. Right ventricular systolic function is moderately reduced. The right ventricular size is  normal. Tricuspid regurgitation signal is inadequate for assessing PA pressure.  3. Right atrial size was mildly dilated.  4. The mitral valve is normal in structure. No evidence of mitral valve regurgitation. No evidence of mitral stenosis.  5. The aortic valve is tricuspid. There is moderate calcification of the aortic valve. Aortic valve regurgitation is not visualized. Aortic valve sclerosis/calcification is present, without any evidence of aortic stenosis.  6. Aortic dilatation noted. There is borderline dilatation of the aortic root, measuring 38 mm.  7. The inferior vena cava is normal in size with greater than 50% respiratory variability, suggesting right atrial pressure of 3 mmHg. FINDINGS  Left Ventricle: Left ventricular ejection fraction, by estimation, is 60 to 65%. The left ventricle has normal function. The left ventricle has no regional wall motion abnormalities. The left ventricular internal cavity size was normal in size. There is  mild concentric left ventricular hypertrophy. Left ventricular diastolic parameters are consistent with Grade I diastolic dysfunction (impaired relaxation). Right Ventricle: The mid RV free wall appears severely hypokinetic to akinetic with preserved RV apical function, McConnell's sign appears to be present and is concerning for PE. The right ventricular size is normal. No increase in right ventricular  wall  thickness. Right ventricular systolic function is moderately reduced.  Tricuspid regurgitation signal is inadequate for assessing PA pressure. Left Atrium: Left atrial size was normal in size. Right Atrium: Right atrial size was mildly dilated. Pericardium: Trivial pericardial effusion is present. Mitral Valve: The mitral valve is normal in structure. Mild mitral annular calcification. No evidence of mitral valve regurgitation. No evidence of mitral valve stenosis. Tricuspid Valve: The tricuspid valve is normal in structure. Tricuspid valve regurgitation is not demonstrated. Aortic Valve: The aortic valve is tricuspid. There is moderate calcification of the aortic valve. Aortic valve regurgitation is not visualized. Aortic valve sclerosis/calcification is present, without any evidence of aortic stenosis. Pulmonic Valve: The pulmonic valve was normal in structure. Pulmonic valve regurgitation is not visualized. Aorta: Aortic dilatation noted. There is borderline dilatation of the aortic root, measuring 38 mm. Venous: The inferior vena cava is normal in size with greater than 50% respiratory variability, suggesting right atrial pressure of 3 mmHg. IAS/Shunts: No atrial level shunt detected by color flow Doppler.  LEFT VENTRICLE PLAX 2D LVIDd:         4.20 cm      Diastology LVIDs:         2.90 cm      LV e' medial:    3.59 cm/s LV PW:         1.40 cm      LV E/e' medial:  28.7 LV IVS:        1.40 cm      LV e' lateral:   4.68 cm/s LVOT diam:     2.00 cm      LV E/e' lateral: 22.0 LV SV:         63 LV SV Index:   28 LVOT Area:     3.14 cm  LV Volumes (MOD) LV vol d, MOD A2C: 113.0 ml LV vol d, MOD A4C: 93.3 ml LV vol s, MOD A2C: 47.8 ml LV vol s, MOD A4C: 41.7 ml LV SV MOD A2C:     65.2 ml LV SV MOD A4C:     93.3 ml LV SV MOD BP:      58.1 ml RIGHT VENTRICLE RV S prime:     11.00 cm/s TAPSE (M-mode): 1.8 cm LEFT ATRIUM             Index        RIGHT ATRIUM           Index LA diam:        2.40 cm 1.05 cm/m   RA Area:     19.30 cm LA Vol (A2C):   63.4 ml 27.86 ml/m  RA Volume:   53.10  ml  23.34 ml/m LA Vol (A4C):   42.4 ml 18.63 ml/m LA Biplane Vol: 53.3 ml 23.43 ml/m  AORTIC VALVE LVOT Vmax:   81.40 cm/s LVOT Vmean:  59.900 cm/s LVOT VTI:    0.202 m  AORTA Ao Root diam: 3.80 cm Ao Asc diam:  3.60 cm MITRAL VALVE MV Area (PHT): 6.90 cm     SHUNTS MV Decel Time: 110 msec     Systemic VTI:  0.20 m MV E velocity: 103.00 cm/s  Systemic Diam: 2.00 cm MV A velocity: 51.40 cm/s MV E/A ratio:  2.00 Dalton McleanMD Electronically signed by Ezra Kanner Signature Date/Time: 01/19/2024/6:41:17 PM    Final    VAS US  LOWER EXTREMITY VENOUS (DVT) Result Date: 01/19/2024  Lower Venous DVT Study Patient Name:  JAHI ROZA  Sr.  Date of Exam:   01/19/2024 Medical Rec #: [id number]            Accession #:    [id number] Date of Birth: 1950/01/28            Patient Gender: M Patient Age:   16 years Exam Location:  [facility] [place] Procedure:      VAS US  LOWER EXTREMITY VENOUS (DVT) Referring Phys: MAXIMINO SHARPS --------------------------------------------------------------------------------  Indications: Pulmonary embolism.  Anticoagulation: Heparin . Comparison Study: No priors. Performing Technologist: Ricka Sturdivant-Jones RDMS, RVT  Examination Guidelines: A complete evaluation includes B-mode imaging, spectral Doppler, color Doppler, and power Doppler as needed of all accessible portions of each vessel. Bilateral testing is considered an integral part of a complete examination. Limited examinations for reoccurring indications may be performed as noted. The reflux portion of the exam is performed with the patient in reverse Trendelenburg.  +---------+---------------+---------+-----------+----------+--------------+ RIGHT    CompressibilityPhasicitySpontaneityPropertiesThrombus Aging +---------+---------------+---------+-----------+----------+--------------+ CFV      Full           Yes      Yes                                  +---------+---------------+---------+-----------+----------+--------------+ SFJ      Full                                                        +---------+---------------+---------+-----------+----------+--------------+ FV Prox  Full                                                        +---------+---------------+---------+-----------+----------+--------------+ FV Mid   Full           Yes      Yes                                 +---------+---------------+---------+-----------+----------+--------------+ FV DistalFull                                                        +---------+---------------+---------+-----------+----------+--------------+ PFV      Full                                                        +---------+---------------+---------+-----------+----------+--------------+ POP      Full           Yes      Yes                                 +---------+---------------+---------+-----------+----------+--------------+ PTV      Full                                                        +---------+---------------+---------+-----------+----------+--------------+  PERO     Full                                                        +---------+---------------+---------+-----------+----------+--------------+   +---------+---------------+---------+-----------+----------+--------------+ LEFT     CompressibilityPhasicitySpontaneityPropertiesThrombus Aging +---------+---------------+---------+-----------+----------+--------------+ CFV      Full           Yes      Yes                                 +---------+---------------+---------+-----------+----------+--------------+ SFJ      Full                                                        +---------+---------------+---------+-----------+----------+--------------+ FV Prox  Full                                                         +---------+---------------+---------+-----------+----------+--------------+ FV Mid   None           No       No                   Acute          +---------+---------------+---------+-----------+----------+--------------+ FV DistalNone           No       No                   Acute          +---------+---------------+---------+-----------+----------+--------------+ PFV      Full                                                        +---------+---------------+---------+-----------+----------+--------------+ POP      None           No       No                   Acute          +---------+---------------+---------+-----------+----------+--------------+ PTV      None                                         Acute          +---------+---------------+---------+-----------+----------+--------------+ PERO     None                                         Acute          +---------+---------------+---------+-----------+----------+--------------+ Gastroc  None  Acute          +---------+---------------+---------+-----------+----------+--------------+ SSV      None                                         Acute          +---------+---------------+---------+-----------+----------+--------------+   Summary: RIGHT: - There is no evidence of deep vein thrombosis in the lower extremity.  - No cystic structure found in the popliteal fossa.  LEFT: - Findings consistent with acute deep vein thrombosis involving the left femoral vein, left popliteal vein, left posterior tibial veins, and left peroneal veins. - Findings consistent with acute superficial vein thrombosis involving the left small saphenous vein.  Findings consistent with age indeterminate intramuscular thrombus involving the left gastrocnemius veins.  *See table(s) above for measurements and observations. Electronically signed by Penne Colorado MD on 01/19/2024 at 4:09:54 PM.    Final     CT CHEST ABDOMEN PELVIS W CONTRAST  EXAM: CT CHEST, ABDOMEN AND PELVIS WITH CONTRAST 01/18/2024 10:28:13 AM TECHNIQUE: CT of the chest, abdomen and pelvis was performed with the administration of 100 cc iohexol  (OMNIPAQUE ) 300 MG/ML solution. Multiplanar reformatted images are provided for review. Automated exposure control, iterative reconstruction, and/or weight based adjustment of the mA/kV was utilized to reduce the radiation dose to as low as reasonably achievable. COMPARISON: CT scan 01/07/2023. CLINICAL HISTORY: Peripheral T cell lymphoma restaging, recent diagnosis of prostate cancer, shortness of breath. * Tracking Code: BO * FINDINGS: CHEST: MEDIASTINUM AND LYMPH NODES: Heart and pericardium are unremarkable. The central airways are clear. No mediastinal, hilar or axillary lymphadenopathy. Extensive filling defect compatible with embolus in the left main and left upper and lower lobe pulmonary arteries and in the right lower lobe and right middle lobe pulmonary arteries compatible with acute pulmonary embolus. Right ventricular to left ventricular ratio 1.17, suspicious for right heart strain. This is compatible with at least submassive pulmonary embolus. Chest atherosclerosis. LUNGS AND PLEURA: Mild scarring and superimposed atelectasis in the right lower lobe. Mild scarring in the left lower lobe. No pleural effusion or pneumothorax. ABDOMEN AND PELVIS: LIVER: The liver is unremarkable. GALLBLADDER AND BILE DUCTS: Cholecystectomy. No biliary ductal dilatation. SPLEEN: No acute abnormality. PANCREAS: No acute abnormality. ADRENAL GLANDS: 1.2 x 1.8 x 1.5 cm left adrenal mass was present but not hypermetabolic on 12/26/2020, favoring benign etiology. KIDNEYS, URETERS AND BLADDER: Simple appearing bilateral renal cysts warrant no further imaging workup. No stones in the kidneys or ureters. No hydronephrosis. No perinephric or periureteral stranding. Urinary bladder is unremarkable. GI AND BOWEL: Stomach  demonstrates no acute abnormality. There is no bowel obstruction. REPRODUCTIVE ORGANS: Fiducials along the right side of the prostate gland with low density material along the posterior margin of the prostate gland possibly reflecting hydrogel. PERITONEUM AND RETROPERITONEUM: No ascites. No free air. VASCULATURE: Aorta is normal in caliber. Systemic atherosclerosis is present, including the aorta and iliac arteries. ABDOMINAL AND PELVIS LYMPH NODES: No lymphadenopathy. BONES AND SOFT TISSUES: Stable scattered lucent and sclerotic bony lesions throughout the thoracic skeleton. Scattered mixed density lesions in the skeleton appear unchanged. Degenerative arthropathy of both hips with osteochondral lesions posteriorly along the femoral heads and questionable small osteochondral fragments in the right acetabular fossa. Fatty mass of the left quadratus femoris muscle with associated internal calcification, favoring benign lipoma. IMPRESSION: 1. Extensive filling defects compatible with acute pulmonary emboli in the left  main, left upper and lower lobe, right middle lobe, and right lower lobe pulmonary arteries, with right ventricular to left ventricular ratio 1.17 concerning for right heart strain consistent with at least submassive pulmonary embolus. 2. Stable scattered lucent and sclerotic osseous lesions throughout the thoracic skeleton, compatible with known metastatic or chronic osseous disease. 3. 1.2 x 1.8 x 1.5 cm left adrenal mass, previously non-hypermetabolic on 12/26/20, favoring benign etiology. 4. Mild scarring and superimposed atelectasis in the right lower lobe and mild scarring in the left lower lobe. 5. Degenerative arthropathy of both hips with osteochondral lesions posteriorly along the femoral heads and possible small osteochondral fragments in the right acetabular fossa. 6. Simple-appearing bilateral renal cysts and fatty mass of the left quadratus femoris muscle with internal calcification,  favoring benign lipoma. 7. Systemic atherosclerosis involving the chest aorta and iliac arteries and prior cholecystectomy. 8. Fiducial markers along the right side of the prostate gland with adjacent low density material along the posterior margin, possibly reflecting hydrogel. Professional radiology assistant personnel have been engaged to place me in direct telephone contact with the referring physician or the referring physician's covering provider. Once that has been established, I will addend this report with documentation of the communication. Electronically signed by: Ryan Salvage MD 01/18/2024 11:15 AM EST RP Workstation: [id number]      Signature  -   Lavada Stank M.D on 01/20/2024 at 9:15 AM   -  To page go to [email or web address]      "

## 2024-01-20 NOTE — Consult Note (Signed)
 "  NAME:  Maurice Maniaci., MRN:  [id number], DOB:  Feb 24, 1950, LOS: 1 ADMISSION DATE:  01/18/2024, CONSULTATION DATE:  01/20/24 REFERRING MD:  Dennise CHIEF COMPLAINT:  Dyspnea   History of Present Illness:  Maurice KAPAUN Sr. is a 73 y.o. male who has a PMH as outlined below including but not limited to hypertension, hyperlipidemia, DM2, Stage III/IV T-cell lymphoma 2022 status post 6 cycles BV-CHP treatment, (followed by Dr. Federico) prostate cancer s/p gold seed placement and spaceOAR placement on 01/05/2024 (procedure by Dr. [name], follows with Dr. Patrcia of oncology) , OSA on CPAP.    He went for follow-up staging imaging on 01/18/2024.  This demonstrated bilateral PEs with RV/LV 1.17.  He was notified to come to the ED for further evaluation and management.  He was admitted that day for management of PE.  He had some dyspnea in the preceding days which was new for him.  He did not have any lightheadedness, chest pain, hemoptysis, syncope.  He was started on a heparin  infusion.  Lower extremity Dopplers 12/30 were obtained and demonstrated left lower extremity DVT involving the left femoral vein, left popliteal vein, left posterior tibial veins, left peroneal vein as well as a SVT in the left saphenous vein.  An echocardiogram 12/30 demonstrated EF 60-65%, G1 DD, moderately reduced RV systolic function.  12/31, PCCM was asked to see in consultation to confirm patient did not require any interventions.  He has been hemodynamically stable, he is on room air, he denies any symptoms at rest.  His troponin and lactic acid on admission were normal.  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  Significant Hospital Events: Including procedures, antibiotic start and stop dates in addition to other pertinent events   12/29 admit 12/31 PCCM consult  Interim History / Subjective:  Comfortable, sitting in recliner, talking on phone. No symptoms.  Objective:  Blood pressure 111/72, pulse 94, temperature 98 F (36.7 C), temperature source Oral, resp. rate 18, height 5' 10 (1.778 m), weight 111.1 kg, SpO2 94%.       No intake or output data in the 24 hours ending 01/20/24 1037 Filed Weights   01/18/24 1400  Weight: 111.1 kg     Physical Exam: General: Adult male, sitting up in recliner, in NAD. Neuro: A&O x 3, no deficits. HEENT: [place]/AT. Sclerae anicteric. EOMI. Cardiovascular: RRR, no M/R/G.  Lungs: Respirations even and unlabored.  CTA bilaterally, No W/R/R. Abdomen: BS x 4, soft, NT/ND.  Musculoskeletal: No gross deformities, no edema.   Labs/imaging personally reviewed:  CT chest/abd/pelv 12/29 > bilateral PE with RV/LV 1.17, stable scattered lucent and sclerotic osseous lesions throughout the thoracic skeleton compatible with known metastatic disease. LE duplex 12/30 >  left lower extremity DVT involving the left femoral vein, left popliteal vein, left posterior tibial veins, left peroneal vein as well as a SVT in the left saphenous vein.  Echo 12/30 > EF 60-65%, G1 DD, moderately reduced RV systolic function. Assessment & Plan:   Bilateral PE, LLE DVT, LLE SVT - provoked in setting of known malignancy as well as recent brief immobilization following his surgery 01/05/24 .  He is stable on  room air with normal hemodynamics since admission, troponin lactic acid were normal.   - Continue heparin  infusion. - TRH, plan to transition to Eliquis and have outpatient follow-up with repeat limited echo in 3 - 6 months. - Echo did show moderately reduced RV function however with  negative biomarkers on admission and asymptomatic state probably reasonable to continue with standard conservative therapies. Will reorder biomarkers now 12/31 to ensure they are still flat/negative. - Would continue lifelong anticoagulation given his known malignancy.  Hx OSA on nocturnal CPAP. - Continue nocturnal CPAP.  Tobacco dependence. - Tobacco cessation counseling.  Rest per primary team.  Labs   CBC: Recent Labs  Lab 01/18/24 1435 01/19/24 0549 01/20/24 0322  WBC 6.7 5.8 8.4  HGB 13.6 13.4 13.1  HCT 40.9 39.9 38.8*  MCV 88.5 86.7 86.0  PLT 215 230 218    Basic Metabolic Panel: Recent Labs  Lab 01/18/24 1435 01/19/24 0549  NA 131* 137  K 4.5 4.2  CL 99 104  CO2 19* 23  GLUCOSE 262* 218*  BUN 25* 21  CREATININE 1.35* 1.19  CALCIUM  9.0 9.1   GFR: Estimated Creatinine Clearance: 69 mL/min (by C-G formula based on SCr of 1.19 mg/dL). Recent Labs  Lab 01/18/24 1435 01/18/24 1439 01/19/24 0549 01/20/24 0322  WBC 6.7  --  5.8 8.4  LATICACIDVEN  --  1.3  --   --     Liver Function Tests: No results for input(s): AST, ALT, ALKPHOS, BILITOT, PROT, ALBUMIN  in the last 168 hours. No results for input(s): LIPASE, AMYLASE in the last 168 hours. No results for input(s): AMMONIA in the last 168 hours.  ABG    Component Value Date/Time   TCO2 23 11/29/2019 1103     Coagulation Profile: Recent Labs  Lab 01/18/24 1435  INR 1.1    Cardiac Enzymes: No results for input(s): CKTOTAL, CKMB, CKMBINDEX, TROPONINI in the last 168 hours.  HbA1C: Hgb A1c MFr Bld  Date/Time Value Ref Range Status  01/05/2024 07:00 AM 8.9 (H) 4.8 - 5.6 % Final    Comment:    (NOTE) Diagnosis of Diabetes The following HbA1c ranges recommended by the American Diabetes Association (ADA) may be used as an aid in the diagnosis of diabetes mellitus.  Hemoglobin             Suggested A1C NGSP%              Diagnosis  <5.7                   Non  Diabetic  5.7-6.4                Pre-Diabetic  >6.4                   Diabetic  <7.0                   Glycemic control for                       adults with diabetes.    07/08/2020 05:27 AM 7.1 (H) 4.8 - 5.6 % Final    Comment:    (NOTE)         Prediabetes: 5.7 - 6.4         Diabetes: >6.4         Glycemic control for adults with diabetes: <7.0     CBG: Recent Labs  Lab 01/19/24 2129 01/20/24 0043 01/20/24  0126 01/20/24 0640 01/20/24 0822  GLUCAP 163* 69* 153* 133* 153*    Review of Systems:   All negative; except for those that are bolded, which indicate positives.  Constitutional: weight loss, weight gain, night sweats, fevers, chills, fatigue, weakness.  HEENT: headaches, sore throat, sneezing, nasal congestion, post nasal drip, difficulty swallowing, tooth/dental problems, visual complaints, visual changes, ear aches. Neuro: difficulty with speech, weakness, numbness, ataxia. CV:  chest pain, orthopnea, PND, swelling in lower extremities, dizziness, palpitations, syncope.  Resp: cough, hemoptysis, dyspnea, wheezing. GI: heartburn, indigestion, abdominal pain, nausea, vomiting, diarrhea, constipation, change in bowel habits, loss of appetite, hematemesis, melena, hematochezia.  GU: dysuria, change in color of urine, urgency or frequency, flank pain, hematuria. MSK: joint pain or swelling, decreased range of motion. Psych: change in mood or affect, depression, anxiety, suicidal ideations, homicidal ideations. Skin: rash, itching, bruising.   Past Medical History:  He,  has a past medical history of Arthritis, Cancer (HCC), Hyperlipidemia, Hypertension, Nocturia, OSA on CPAP, Phimosis, Sleep apnea, Type 2 diabetes mellitus (HCC), and Wears glasses.   Surgical History:   Past Surgical History:  Procedure Laterality Date   APPENDECTOMY  child   CHOLECYSTECTOMY N/A 07/09/2020   Procedure: LAPAROSCOPIC CHOLECYSTECTOMY WITH INTRAOPERATIVE CHOLANGIOGRAM,;  Surgeon:  Tanda Locus, MD;  Location: [facility];  Service: General;  Laterality: N/A;   CIRCUMCISION N/A 11/29/2019   Procedure: CIRCUMCISION ADULT;  Surgeon: Rosalind Zachary NOVAK, MD;  Location: [facility];  Service: Urology;  Laterality: N/A;   GOLD SEED IMPLANT N/A 01/05/2024   Procedure: INSERTION, GOLD SEEDS;  Surgeon: [name] Lonni Righter, MD;  Location: [facility];  Service: Urology;  Laterality: N/A;  GOLD SEED IMPLANTS AND SPACEOAR   IR IMAGING GUIDED PORT INSERTION  08/06/2020   IR REMOVAL TUN ACCESS W/ PORT W/O FL MOD SED  04/09/2021   LIVER BIOPSY N/A 07/09/2020   Procedure: LAP LIVER BIOPSY;  Surgeon: Tanda Locus, MD;  Location: [facility];  Service: General;  Laterality: N/A;   ROTATOR CUFF REPAIR Left chld   SPACE OAR INSTILLATION N/A 01/05/2024   Procedure: INJECTION, HYDROGEL SPACER;  Surgeon: [name] Lonni Righter, MD;  Location: [facility];  Service: Urology;  Laterality: N/A;   SUPRA-UMBILICAL HERNIA N/A 07/09/2020   Procedure: PRIMARY REPAIR SUPRA-UMBILICAL HERNIA;  Surgeon: Tanda Locus, MD;  Location: [facility];  Service: General;  Laterality: N/A;     Social History:   reports that he has been smoking cigars and cigarettes. He has a 49 pack-year smoking history. He has never used smokeless tobacco. He reports that he does not currently use alcohol . He reports that he does not use drugs.   Family History:  His family history includes Colon cancer in his sister; Diabetes Mellitus II in his father; Hypotension in his mother; Prostate cancer in his brother and brother.   Allergies Allergies[1]   Home Medications  Prior to Admission medications  Medication Sig Start Date End Date Taking? Authorizing Provider  albuterol  (VENTOLIN  HFA) 108 (90 Base) MCG/ACT inhaler Inhale 2 puffs into the lungs every 4 (four) hours as needed for shortness of breath or wheezing. 07/21/23  Yes [provider]  amLODipine  (NORVASC ) 2.5 MG tablet Take 1 tablet (2.5 mg total) by mouth daily.  07/21/23  Yes   azelastine  (ASTELIN ) 0.1 % nasal spray Place 2 sprays into both nostrils 2 (two) times daily as needed. 02/11/23  Yes   buPROPion  (WELLBUTRIN  XL) 300 MG 24 hr tablet Take 1 tablet (300 mg total) by mouth  every morning. 01/12/24  Yes   clopidogrel  (PLAVIX ) 75 MG tablet Take 1 tablet (75 mg total) by mouth daily. 01/12/24  Yes   diclofenac  Sodium (VOLTAREN ) 1 % GEL Apply 4 g topically to the affected area(s) 4 (four) times daily. 06/18/23  Yes   Dulaglutide  (TRULICITY ) 0.75 MG/0.5ML SOAJ Inject 0.75 mg into the skin once a week. 10/09/22  Yes   insulin  glargine (LANTUS  SOLOSTAR) 100 UNIT/ML Solostar Pen Inject 36 Units into the skin daily. 01/12/24  Yes   metFORMIN (GLUCOPHAGE) 1000 MG tablet Take 1,000 mg by mouth 2 (two) times daily. 02/11/23  Yes [provider]  Multiple Vitamins-Minerals (CENTRUM SILVER PO) Take 1 tablet by mouth at bedtime.   Yes [provider]  olopatadine (PATANOL) 0.1 % ophthalmic solution Apply to eye. 07/28/22  Yes [provider]  ORTHOVISC 30 MG/2ML SOSY intra-articular injection  08/08/23  Yes [provider]  rosuvastatin  (CRESTOR ) 10 MG tablet Take 1 tablet (10 mg total) by mouth at bedtime for cholesterol. 01/01/23  Yes   RYBELSUS  7 MG TABS  12/17/21  Yes [provider]  valsartan -hydrochlorothiazide  (DIOVAN -HCT) 320-25 MG tablet Take 1 tablet by mouth daily. 11/26/23  Yes   Albuterol -Budesonide (AIRSUPRA ) 90-80 MCG/ACT AERO Take 2 puffs by mouth every 4 (four) to 6 (six) hours as needed for shortness of breath. Rinse mouth out with water after each use. Patient not taking: Reported on 01/18/2024 06/25/23     buPROPion  (WELLBUTRIN  XL) 150 MG 24 hr tablet TAKE 1 TABLET BY MOUTH DAILY IN THE MORNING Patient not taking: Reported on 01/18/2024 09/29/23     Continuous Glucose Sensor (FREESTYLE LIBRE 3 PLUS SENSOR) MISC Use as directed. Replace sensor every 15 days 12/23/23     Empagliflozin-metFORMIN HCl (SYNJARDY ) 12.5-500  MG TABS Take 1 tablet by mouth 2 (two) times daily with meals. Patient not taking: Reported on 01/18/2024 01/02/23     insulin  glargine (LANTUS  SOLOSTAR) 100 UNIT/ML Solostar Pen Inject 40 Units into the skin daily. Patient not taking: Reported on 01/18/2024 08/21/23     insulin  glargine (LANTUS  SOLOSTAR) 100 UNIT/ML Solostar Pen Inject 20 Units into the skin daily. Patient not taking: Reported on 01/18/2024 11/26/23     Insulin  Pen Needle 32G X 4 MM MISC Use with Lantus  every day. 03/08/23     Semaglutide ,0.25 or 0.5MG /DOS, (OZEMPIC , 0.25 OR 0.5 MG/DOSE,) 2 MG/3ML SOPN Inject 0.25 mg into the skin every 7 (seven) days. Patient not taking: Reported on 01/18/2024 01/12/24     tamsulosin  (FLOMAX ) 0.4 MG CAPS capsule Take 0.4 mg by mouth daily. Patient not taking: Reported on 01/18/2024 06/06/23   [provider]  tamsulosin  (FLOMAX ) 0.4 MG CAPS capsule Take 1 capsule (0.4 mg total) by mouth daily. Patient not taking: Reported on 01/18/2024 12/04/23         Sammi Gore, PA - [facility] For pager details, please see AMION or use Epic chat  After 1900, please call [facility] for cross coverage needs 01/20/2024, 10:37 AM       [1] No Known Allergies  "

## 2024-01-20 NOTE — Plan of Care (Signed)

## 2024-01-20 NOTE — Discharge Instructions (Addendum)
 Information on my medicine - ELIQUIS (apixaban)  This medication education was reviewed with me or my healthcare representative as part of my discharge preparation.    Why was Eliquis prescribed for you? Eliquis was prescribed to treat blood clots that may have been found in the veins of your legs (deep vein thrombosis) or in your lungs (pulmonary embolism) and to reduce the risk of them occurring again.  What do You need to know about Eliquis ? The starting dose is 10 mg (two 5 mg tablets) taken TWICE daily for the FIRST SEVEN (7) DAYS, then on  [DATE]  the dose is reduced to ONE 5 mg tablet taken TWICE daily.  Eliquis may be taken with or without food.   Try to take the dose about the same time in the morning and in the evening. If you have difficulty swallowing the tablet whole please discuss with your pharmacist how to take the medication safely.  Take Eliquis exactly as prescribed and DO NOT stop taking Eliquis without talking to the doctor who prescribed the medication.  Stopping may increase your risk of developing a new blood clot.  Refill your prescription before you run out.  After discharge, you should have regular check-up appointments with your healthcare provider that is prescribing your Eliquis.    What do you do if you miss a dose? If a dose of ELIQUIS is not taken at the scheduled time, take it as soon as possible on the same day and twice-daily administration should be resumed. The dose should not be doubled to make up for a missed dose.  Important Safety Information A possible side effect of Eliquis is bleeding. You should call your healthcare provider right away if you experience any of the following: Bleeding from an injury or your nose that does not stop. Unusual colored urine (red or dark brown) or unusual colored stools (red or black). Unusual bruising for unknown reasons. A serious fall or if you hit your head (even if there is no bleeding).  Some  medicines may interact with Eliquis and might increase your risk of bleeding or clotting while on Eliquis. To help avoid this, consult your healthcare provider or pharmacist prior to using any new prescription or non-prescription medications, including herbals, vitamins, non-steroidal anti-inflammatory drugs (NSAIDs) and supplements.  This website has more information on Eliquis (apixaban): http://www.eliquis.com/eliquis/home   Kindly follow-up with your oncologist in 7 to 10 days as well in addition to your PCP, urologist and pulmonary physician.    Follow with Primary MD [name], MD in 7 days   Get CBC, CMP, Magnesium, 2 view Chest X ray -  checked next visit with your primary MD    Activity: As tolerated with Full fall precautions use walker/cane & assistance as needed  Disposition Home   Diet: Heart Healthy low carbohydrate, check CBGs q. ACHS  Special Instructions: If you have smoked or chewed Tobacco  in the last 2 yrs please stop smoking, stop any regular Alcohol   and or any Recreational drug use.  On your next visit with your primary care physician please Get Medicines reviewed and adjusted.  Please request your Prim.MD to go over all Hospital Tests and Procedure/Radiological results at the follow up, please get all Hospital records sent to your Prim MD by signing hospital release before you go home.  If you experience worsening of your admission symptoms, develop shortness of breath, life threatening emergency, suicidal or homicidal thoughts you must seek medical attention immediately by  calling 911 or calling your MD immediately  if symptoms less severe.  You Must read complete instructions/literature along with all the possible adverse reactions/side effects for all the Medicines you take and that have been prescribed to you. Take any new Medicines after you have completely understood and accpet all the possible adverse reactions/side effects.   Do not drive when  taking Pain medications.  Do not take more than prescribed Pain, Sleep and Anxiety Medications  Wear Seat belts while driving.

## 2024-01-20 NOTE — Progress Notes (Signed)
 Hypoglycemic Event  CBG: 69  Treatment: 8 oz juice/soda  Symptoms: None  Follow-up CBG: Time: 0126 CBG Result: 153  Possible Reasons for Event: Unknown  Comments/MD notified: Opyd MD notified .  Pt received a notification from cont glucose monitor on phone that glucose was 69 , verified with glucometer , glucose reading of 69. Pt asymptomatic.     Renda Gavel

## 2024-01-20 NOTE — Telephone Encounter (Signed)
 Outpatient pulmonary follow-up requested.

## 2024-01-20 NOTE — Progress Notes (Signed)
 PHARMACY - ANTICOAGULATION CONSULT NOTE  Pharmacy Consult for IV heparin  Indication: pulmonary embolus  Allergies[1]  Patient Measurements: Height: 5' 10 (177.8 cm) Weight: 111.1 kg (245 lb) IBW/kg (Calculated) : 73 HEPARIN  DW (KG): 97.2  Vital Signs: Temp: 97.4 F (36.3 C) (12/31 0437) Temp Source: Oral (12/31 0437) BP: 97/62 (12/31 0437) Pulse Rate: 74 (12/31 0437)  Labs: Recent Labs    01/18/24 1435 01/18/24 2329 01/19/24 0549 01/20/24 0322  HGB 13.6  --  13.4 13.1  HCT 40.9  --  39.9 38.8*  PLT 215  --  230 218  APTT 37*  --   --   --   LABPROT 14.7  --   --   --   INR 1.1  --   --   --   HEPARINUNFRC  --  0.65 0.65 0.54  CREATININE 1.35*  --  1.19  --     Estimated Creatinine Clearance: 69 mL/min (by C-G formula based on SCr of 1.19 mg/dL).   Medical History: Past Medical History:  Diagnosis Date   Arthritis    Cancer (HCC)    Hyperlipidemia    Hypertension    followed by pcp  (11-23-2019  per pt had stress test greater than 20 yrs ago, told ok)   Nocturia    OSA on CPAP    per pt uses nightly   Phimosis    Sleep apnea    Type 2 diabetes mellitus (HCC)    followed by pcp   (11-23-2019 per pt does not check blood sugar at home)   Wears glasses    Assessment: Maurice MCNAIRY Sr. is a 73 y.o. year old male admitted on 01/18/2024 with concern for shortness of breath found to have PE. CTA with extensive clot burden noted and RHS present.No anticoagulation prior to admission. Pharmacy consulted to dose heparin .  12/31 AM update: HL 0.54 No overt s/sx of bleeding - cbc stable. No issues with infusion noted.   Goal of Therapy:  Heparin  level 0.3-0.7 units/ml Monitor platelets by anticoagulation protocol: Yes   Plan:  Continue heparin  infusion at 1600 units/hr Daily heparin  level, CBC, and monitoring for bleeding F/u plans for anticoagulation   Thank you for allowing pharmacy to participate in this patient's care.   Benedetta Heath BS, PharmD,  BCPS Clinical Pharmacist 01/20/2024 7:18 AM  Contact: [phone] after 3 PM    [1] No Known Allergies

## 2024-01-21 DIAGNOSIS — I2699 Other pulmonary embolism without acute cor pulmonale: Secondary | ICD-10-CM | POA: Diagnosis not present

## 2024-01-21 LAB — CBC WITH DIFFERENTIAL/PLATELET
Abs Immature Granulocytes: 0.02 K/uL (ref 0.00–0.07)
Basophils Absolute: 0.1 K/uL (ref 0.0–0.1)
Basophils Relative: 1 %
Eosinophils Absolute: 0.3 K/uL (ref 0.0–0.5)
Eosinophils Relative: 4 %
HCT: 37.4 % — ABNORMAL LOW (ref 39.0–52.0)
Hemoglobin: 12.5 g/dL — ABNORMAL LOW (ref 13.0–17.0)
Immature Granulocytes: 0 %
Lymphocytes Relative: 27 %
Lymphs Abs: 1.6 K/uL (ref 0.7–4.0)
MCH: 29.3 pg (ref 26.0–34.0)
MCHC: 33.4 g/dL (ref 30.0–36.0)
MCV: 87.6 fL (ref 80.0–100.0)
Monocytes Absolute: 0.6 K/uL (ref 0.1–1.0)
Monocytes Relative: 10 %
Neutro Abs: 3.5 K/uL (ref 1.7–7.7)
Neutrophils Relative %: 58 %
Platelets: 219 K/uL (ref 150–400)
RBC: 4.27 MIL/uL (ref 4.22–5.81)
RDW: 13.9 % (ref 11.5–15.5)
WBC: 6 K/uL (ref 4.0–10.5)
nRBC: 0 % (ref 0.0–0.2)

## 2024-01-21 LAB — BASIC METABOLIC PANEL WITH GFR
Anion gap: 12 (ref 5–15)
BUN: 21 mg/dL (ref 8–23)
CO2: 21 mmol/L — ABNORMAL LOW (ref 22–32)
Calcium: 8.8 mg/dL — ABNORMAL LOW (ref 8.9–10.3)
Chloride: 104 mmol/L (ref 98–111)
Creatinine, Ser: 1.22 mg/dL (ref 0.61–1.24)
GFR, Estimated: >60 mL/min
Glucose, Bld: 162 mg/dL — ABNORMAL HIGH (ref 70–99)
Potassium: 3.9 mmol/L (ref 3.5–5.1)
Sodium: 137 mmol/L (ref 135–145)

## 2024-01-21 LAB — HEPARIN LEVEL (UNFRACTIONATED): Heparin Unfractionated: 0.52 [IU]/mL (ref 0.30–0.70)

## 2024-01-21 LAB — GLUCOSE, CAPILLARY: Glucose-Capillary: 134 mg/dL — ABNORMAL HIGH (ref 70–99)

## 2024-01-21 LAB — MAGNESIUM: Magnesium: 2.5 mg/dL — ABNORMAL HIGH (ref 1.7–2.4)

## 2024-01-21 MED ORDER — APIXABAN 5 MG PO TABS: ORAL_TABLET | ORAL | 0 refills | Status: AC

## 2024-01-21 MED ORDER — APIXABAN 5 MG PO TABS: 5.0000 mg | ORAL_TABLET | Freq: Two times a day (BID) | ORAL | Status: DC

## 2024-01-21 MED ORDER — APIXABAN 5 MG PO TABS
10.0000 mg | ORAL_TABLET | Freq: Two times a day (BID) | ORAL | Status: DC
  Administered 2024-01-21: 10 mg via ORAL
  Filled 2024-01-21: qty 2

## 2024-01-21 NOTE — Discharge Summary (Signed)
 "                                                                                                                                                                               Discharge summary note.  Maurice DEL Armijo Sr. FMW:[id number] DOB: Aug 20, 1950 DOA: 01/18/2024  PCP: [name], MD  Admit date: 01/18/2024  Discharge date: 01/21/2024  Admitted From: Home   Disposition:  Home   Recommendations for Outpatient Follow-up:   Follow up with PCP in 1-2 weeks  PCP Please obtain BMP/CBC, 2 view CXR in 1week,  (see Discharge instructions)   PCP Please follow up on the following pending results:    Home Health: None   Equipment/Devices: None  Consultations: PCCM Discharge Condition: Stable    CODE STATUS: Full    Diet Recommendation: Heart Healthy Low Carb    Chief Complaint  Patient presents with   pulmonary embolism      Brief history of present illness from the day of admission and additional interim summary    74 y.o. male with medical history significant of hypertension, hyperlipidemia, diabetes mellitus type 2, T-cell lymphoma s/p treatment, prostate cancer, OSA on CPAP presented after having abnormal CT.   He recently underwent a procedure involving Gold seed placement and Hydrogel spacer for prostate cancer on January 05, 2024.   He experiences shortness of breath that began just prior to admission.  Was found to have moderate to large PE and DVT and admitted to the hospital.                                                                 Hospital Course   Acute PE and acute left lower extremity DVT. LLE DVT  - provoked in setting of malignancy and some immobility post surgery, PE is large with significant left lower extremity DVT as well, echocardiogram noted with some evidence of RV strain, currently on heparin  drip, thankfully hemodynamically stable and relatively symptom-free, and by PCCM cleared for transition to Eliquis and discharge with outpatient  follow-up with PCCM and PCP in 7 to 10 days.  Also requesting patient to follow-up with his primary oncologist in 1 to 2 weeks.  Will discontinue his Plavix  which was started for CAD prevention according to the patient, no history of CAD.   Acute superficial vein thrombosis - Left small saphenous vein - asymptomatic    Elevated troponin Acute.  No CP. Presumed demand in setting of PE -  Stable remains chest pain-free.   Prostate cancer Patient was diagnosed with grade 2 prostate cancer.  He had gold seed fiduciary marker and SpaceOAR placement with Dr. [name] on 12/16.  He is status post to start radiation treatments in the coming week.  Postdischarge follow-up with Dr. [name] and his oncologist in 7 to 10 days, patient requested to follow-up.   Essential hypertension Blood pressures are currently stable continue home regimen   History of T-cell lymphoma Patient with a history of T-cell lymphoma, status post 6 cycles of BVCHP back in 2022.  Bone marrow biopsy in 2023 showed no evidence of residual disease.  Last CT scan showed no residual disease.  Requested to follow-up with his primary oncologist within 7 to 10 days of discharge in the light of new diagnosis of prostate cancer and now large DVT PE.   Tobacco abuse Consult to quit.   OSA - Continue CPAP nightly   Uncontrolled diabetes mellitus type 2, with long-term use of insulin  Last hemoglobin A1c noted to be 8.9 when checked on 12/16. For now continue home regimen and follow-up with PCP for glycemic control.  Discharge diagnosis     Principal Problem:   Pulmonary embolus (HCC) Active Problems:   Elevated troponin   DVT (deep venous thrombosis) (HCC)   Malignant neoplasm of prostate (HCC)   Type 2 diabetes mellitus (HCC)   Hypertension   Renal insufficiency   History of lymphoma   Tobacco abuse   OSA on CPAP    Discharge instructions    Discharge Instructions     Ambulatory Referral for Lung Cancer Scre    Complete by: As directed    Discharge instructions   Complete by: As directed    Kindly follow-up with your oncologist in 7 to 10 days as well in addition to your PCP, urologist and pulmonary physician.     Follow with Primary MD [name], MD in 7 days   Get CBC, CMP, Magnesium, 2 view Chest X ray -  checked next visit with your primary MD    Activity: As tolerated with Full fall precautions use walker/cane & assistance as needed  Disposition Home   Diet: Heart Healthy low carbohydrate, check CBGs q. ACHS  Special Instructions: If you have smoked or chewed Tobacco  in the last 2 yrs please stop smoking, stop any regular Alcohol   and or any Recreational drug use.  On your next visit with your primary care physician please Get Medicines reviewed and adjusted.  Please request your Prim.MD to go over all Hospital Tests and Procedure/Radiological results at the follow up, please get all Hospital records sent to your Prim MD by signing hospital release before you go home.  If you experience worsening of your admission symptoms, develop shortness of breath, life threatening emergency, suicidal or homicidal thoughts you must seek medical attention immediately by calling 911 or calling your MD immediately  if symptoms less severe.  You Must read complete instructions/literature along with all the possible adverse reactions/side effects for all the Medicines you take and that have been prescribed to you. Take any new Medicines after you have completely understood and accpet all the possible adverse reactions/side effects.   Do not drive when taking Pain medications.  Do not take more than prescribed Pain, Sleep and Anxiety Medications  Wear Seat belts while driving.   Increase activity slowly   Complete by: As directed    No wound care   Complete by: As directed  Discharge Medications   Allergies as of 01/21/2024   No Known Allergies      Medication List     STOP  taking these medications    Airsupra  90-80 MCG/ACT Aero Generic drug: Albuterol -Budesonide   clopidogrel  75 MG tablet Commonly known as: PLAVIX    Synjardy  12.5-500 MG Tabs Generic drug: Empagliflozin-metFORMIN HCl   tamsulosin  0.4 MG Caps capsule Commonly known as: FLOMAX        TAKE these medications    albuterol  108 (90 Base) MCG/ACT inhaler Commonly known as: VENTOLIN  HFA Inhale 2 puffs into the lungs every 4 (four) hours as needed for shortness of breath or wheezing.   amLODipine  2.5 MG tablet Commonly known as: NORVASC  Take 1 tablet (2.5 mg total) by mouth daily.   apixaban 5 MG Tabs tablet Commonly known as: ELIQUIS Take 2 tablets (10 mg total) by mouth 2 (two) times daily for 7 days, THEN 1 tablet (5 mg total) 2 (two) times daily for 23 days. Start taking on: January 21, 2024   azelastine  0.1 % nasal spray Commonly known as: ASTELIN  Place 2 sprays into both nostrils 2 (two) times daily as needed.   buPROPion  300 MG 24 hr tablet Commonly known as: WELLBUTRIN  XL Take 1 tablet (300 mg total) by mouth every morning. What changed: Another medication with the same name was removed. Continue taking this medication, and follow the directions you see here.   CENTRUM SILVER PO Take 1 tablet by mouth at bedtime.   diclofenac  Sodium 1 % Gel Commonly known as: VOLTAREN  Apply 4 g topically to the affected area(s) 4 (four) times daily.   Embecta Pen Needle Nano 32G X 4 MM Misc Generic drug: Insulin  Pen Needle Use with Lantus  every day.   FreeStyle Libre 3 Plus Sensor Misc Use as directed. Replace sensor every 15 days   Lantus  SoloStar 100 UNIT/ML Solostar Pen Generic drug: insulin  glargine Inject 36 Units into the skin daily. What changed: Another medication with the same name was removed. Continue taking this medication, and follow the directions you see here.   metFORMIN 1000 MG tablet Commonly known as: GLUCOPHAGE Take 1,000 mg by mouth 2 (two) times daily.    olopatadine 0.1 % ophthalmic solution Commonly known as: PATANOL Apply to eye.   OrthoVisc 30 MG/2ML Sosy intra-articular injection Generic drug: Hyaluronan   rosuvastatin  10 MG tablet Commonly known as: CRESTOR  Take 1 tablet (10 mg total) by mouth at bedtime for cholesterol.   Rybelsus  7 MG Tabs Generic drug: Semaglutide  What changed: Another medication with the same name was removed. Continue taking this medication, and follow the directions you see here.   Trulicity  0.75 MG/0.5ML Soaj Generic drug: Dulaglutide  Inject 0.75 mg into the skin once a week.   valsartan -hydrochlorothiazide  320-25 MG tablet Commonly known as: DIOVAN -HCT Take 1 tablet by mouth daily.         Follow-up Information     [name], MD. Schedule an appointment as soon as possible for a visit in 1 week(s).   Specialty: Family Medicine Why: And your urologist within a week of discharge. Contact information: [address] [place] [address] [phone]         Hunsucker, Donnice SAUNDERS, MD. Schedule an appointment as soon as possible for a visit in 1 week(s).   Specialty: Pulmonary Disease Contact information: [address] [place] [address] [phone]                 Major procedures and  Radiology Reports - PLEASE review detailed and final reports thoroughly  -       ECHOCARDIOGRAM COMPLETE Result Date: 01/19/2024    ECHOCARDIOGRAM REPORT   Patient Name:   Maurice KINER Sr. Date of Exam: 01/19/2024 Medical Rec #:  [id number]           Height:       70.0 in Accession #:    [id number]          Weight:       245.0 lb Date of Birth:  Jun 09, 1950           BSA:          2.275 m Patient Age:    73 years            BP:           127/85 mmHg Patient Gender: M                   HR:           91 bpm. Exam Location:  Inpatient Procedure: 2D Echo, Color Doppler and Cardiac Doppler (Both Spectral and Color            Flow Doppler were utilized during  procedure). Indications:    Pulmonary Embolus I26.09  History:        Patient has no prior history of Echocardiogram examinations.                 Risk Factors:Hypertension and Diabetes.  Sonographer:    Sydnee Wilson RDCS Referring Phys: MAXIMINO LABOR SMITH IMPRESSIONS  1. Left ventricular ejection fraction, by estimation, is 60 to 65%. The left ventricle has normal function. The left ventricle has no regional wall motion abnormalities. There is mild concentric left ventricular hypertrophy. Left ventricular diastolic parameters are consistent with Grade I diastolic dysfunction (impaired relaxation).  2. The mid RV free wall appears severely hypokinetic to akinetic with preserved RV apical function, McConnell's sign appears to be present and is concerning for PE. Right ventricular systolic function is moderately reduced. The right ventricular size is  normal. Tricuspid regurgitation signal is inadequate for assessing PA pressure.  3. Right atrial size was mildly dilated.  4. The mitral valve is normal in structure. No evidence of mitral valve regurgitation. No evidence of mitral stenosis.  5. The aortic valve is tricuspid. There is moderate calcification of the aortic valve. Aortic valve regurgitation is not visualized. Aortic valve sclerosis/calcification is present, without any evidence of aortic stenosis.  6. Aortic dilatation noted. There is borderline dilatation of the aortic root, measuring 38 mm.  7. The inferior vena cava is normal in size with greater than 50% respiratory variability, suggesting right atrial pressure of 3 mmHg. FINDINGS  Left Ventricle: Left ventricular ejection fraction, by estimation, is 60 to 65%. The left ventricle has normal function. The left ventricle has no regional wall motion abnormalities. The left ventricular internal cavity size was normal in size. There is  mild concentric left ventricular hypertrophy. Left ventricular diastolic parameters are consistent with Grade I diastolic  dysfunction (impaired relaxation). Right Ventricle: The mid RV free wall appears severely hypokinetic to akinetic with preserved RV apical function, McConnell's sign appears to be present and is concerning for PE. The right ventricular size is normal. No increase in right ventricular wall  thickness. Right ventricular systolic function is moderately reduced. Tricuspid regurgitation signal is inadequate for assessing PA pressure. Left Atrium: Left atrial size was normal in size. Right  Atrium: Right atrial size was mildly dilated. Pericardium: Trivial pericardial effusion is present. Mitral Valve: The mitral valve is normal in structure. Mild mitral annular calcification. No evidence of mitral valve regurgitation. No evidence of mitral valve stenosis. Tricuspid Valve: The tricuspid valve is normal in structure. Tricuspid valve regurgitation is not demonstrated. Aortic Valve: The aortic valve is tricuspid. There is moderate calcification of the aortic valve. Aortic valve regurgitation is not visualized. Aortic valve sclerosis/calcification is present, without any evidence of aortic stenosis. Pulmonic Valve: The pulmonic valve was normal in structure. Pulmonic valve regurgitation is not visualized. Aorta: Aortic dilatation noted. There is borderline dilatation of the aortic root, measuring 38 mm. Venous: The inferior vena cava is normal in size with greater than 50% respiratory variability, suggesting right atrial pressure of 3 mmHg. IAS/Shunts: No atrial level shunt detected by color flow Doppler.  LEFT VENTRICLE PLAX 2D LVIDd:         4.20 cm      Diastology LVIDs:         2.90 cm      LV e' medial:    3.59 cm/s LV PW:         1.40 cm      LV E/e' medial:  28.7 LV IVS:        1.40 cm      LV e' lateral:   4.68 cm/s LVOT diam:     2.00 cm      LV E/e' lateral: 22.0 LV SV:         63 LV SV Index:   28 LVOT Area:     3.14 cm  LV Volumes (MOD) LV vol d, MOD A2C: 113.0 ml LV vol d, MOD A4C: 93.3 ml LV vol s, MOD A2C: 47.8  ml LV vol s, MOD A4C: 41.7 ml LV SV MOD A2C:     65.2 ml LV SV MOD A4C:     93.3 ml LV SV MOD BP:      58.1 ml RIGHT VENTRICLE RV S prime:     11.00 cm/s TAPSE (M-mode): 1.8 cm LEFT ATRIUM             Index        RIGHT ATRIUM           Index LA diam:        2.40 cm 1.05 cm/m   RA Area:     19.30 cm LA Vol (A2C):   63.4 ml 27.86 ml/m  RA Volume:   53.10 ml  23.34 ml/m LA Vol (A4C):   42.4 ml 18.63 ml/m LA Biplane Vol: 53.3 ml 23.43 ml/m  AORTIC VALVE LVOT Vmax:   81.40 cm/s LVOT Vmean:  59.900 cm/s LVOT VTI:    0.202 m  AORTA Ao Root diam: 3.80 cm Ao Asc diam:  3.60 cm MITRAL VALVE MV Area (PHT): 6.90 cm     SHUNTS MV Decel Time: 110 msec     Systemic VTI:  0.20 m MV E velocity: 103.00 cm/s  Systemic Diam: 2.00 cm MV A velocity: 51.40 cm/s MV E/A ratio:  2.00 Dalton McleanMD Electronically signed by Ezra Kanner Signature Date/Time: 01/19/2024/6:41:17 PM    Final    VAS US  LOWER EXTREMITY VENOUS (DVT) Result Date: 01/19/2024  Lower Venous DVT Study Patient Name:  Maurice SILGUERO Sr.  Date of Exam:   01/19/2024 Medical Rec #: [id number]            Accession #:    [id number] Date  of Birth: 1950-02-02            Patient Gender: M Patient Age:   22 years Exam Location:  [facility] Procedure:      VAS US  LOWER EXTREMITY VENOUS (DVT) Referring Phys: MAXIMINO SHARPS --------------------------------------------------------------------------------  Indications: Pulmonary embolism.  Anticoagulation: Heparin . Comparison Study: No priors. Performing Technologist: Ricka Sturdivant-Jones RDMS, RVT  Examination Guidelines: A complete evaluation includes B-mode imaging, spectral Doppler, color Doppler, and power Doppler as needed of all accessible portions of each vessel. Bilateral testing is considered an integral part of a complete examination. Limited examinations for reoccurring indications may be performed as noted. The reflux portion of the exam is performed with the patient in reverse Trendelenburg.   +---------+---------------+---------+-----------+----------+--------------+ RIGHT    CompressibilityPhasicitySpontaneityPropertiesThrombus Aging +---------+---------------+---------+-----------+----------+--------------+ CFV      Full           Yes      Yes                                 +---------+---------------+---------+-----------+----------+--------------+ SFJ      Full                                                        +---------+---------------+---------+-----------+----------+--------------+ FV Prox  Full                                                        +---------+---------------+---------+-----------+----------+--------------+ FV Mid   Full           Yes      Yes                                 +---------+---------------+---------+-----------+----------+--------------+ FV DistalFull                                                        +---------+---------------+---------+-----------+----------+--------------+ PFV      Full                                                        +---------+---------------+---------+-----------+----------+--------------+ POP      Full           Yes      Yes                                 +---------+---------------+---------+-----------+----------+--------------+ PTV      Full                                                        +---------+---------------+---------+-----------+----------+--------------+  PERO     Full                                                        +---------+---------------+---------+-----------+----------+--------------+   +---------+---------------+---------+-----------+----------+--------------+ LEFT     CompressibilityPhasicitySpontaneityPropertiesThrombus Aging +---------+---------------+---------+-----------+----------+--------------+ CFV      Full           Yes      Yes                                  +---------+---------------+---------+-----------+----------+--------------+ SFJ      Full                                                        +---------+---------------+---------+-----------+----------+--------------+ FV Prox  Full                                                        +---------+---------------+---------+-----------+----------+--------------+ FV Mid   None           No       No                   Acute          +---------+---------------+---------+-----------+----------+--------------+ FV DistalNone           No       No                   Acute          +---------+---------------+---------+-----------+----------+--------------+ PFV      Full                                                        +---------+---------------+---------+-----------+----------+--------------+ POP      None           No       No                   Acute          +---------+---------------+---------+-----------+----------+--------------+ PTV      None                                         Acute          +---------+---------------+---------+-----------+----------+--------------+ PERO     None                                         Acute          +---------+---------------+---------+-----------+----------+--------------+ Gastroc  None  Acute          +---------+---------------+---------+-----------+----------+--------------+ SSV      None                                         Acute          +---------+---------------+---------+-----------+----------+--------------+   Summary: RIGHT: - There is no evidence of deep vein thrombosis in the lower extremity.  - No cystic structure found in the popliteal fossa.  LEFT: - Findings consistent with acute deep vein thrombosis involving the left femoral vein, left popliteal vein, left posterior tibial veins, and left peroneal veins. - Findings consistent with acute  superficial vein thrombosis involving the left small saphenous vein.  Findings consistent with age indeterminate intramuscular thrombus involving the left gastrocnemius veins.  *See table(s) above for measurements and observations. Electronically signed by Penne Colorado MD on 01/19/2024 at 4:09:54 PM.    Final    CT CHEST ABDOMEN PELVIS W CONTRAST  EXAM: CT CHEST, ABDOMEN AND PELVIS WITH CONTRAST 01/18/2024 10:28:13 AM TECHNIQUE: CT of the chest, abdomen and pelvis was performed with the administration of 100 cc iohexol  (OMNIPAQUE ) 300 MG/ML solution. Multiplanar reformatted images are provided for review. Automated exposure control, iterative reconstruction, and/or weight based adjustment of the mA/kV was utilized to reduce the radiation dose to as low as reasonably achievable. COMPARISON: CT scan 01/07/2023. CLINICAL HISTORY: Peripheral T cell lymphoma restaging, recent diagnosis of prostate cancer, shortness of breath. * Tracking Code: BO * FINDINGS: CHEST: MEDIASTINUM AND LYMPH NODES: Heart and pericardium are unremarkable. The central airways are clear. No mediastinal, hilar or axillary lymphadenopathy. Extensive filling defect compatible with embolus in the left main and left upper and lower lobe pulmonary arteries and in the right lower lobe and right middle lobe pulmonary arteries compatible with acute pulmonary embolus. Right ventricular to left ventricular ratio 1.17, suspicious for right heart strain. This is compatible with at least submassive pulmonary embolus. Chest atherosclerosis. LUNGS AND PLEURA: Mild scarring and superimposed atelectasis in the right lower lobe. Mild scarring in the left lower lobe. No pleural effusion or pneumothorax. ABDOMEN AND PELVIS: LIVER: The liver is unremarkable. GALLBLADDER AND BILE DUCTS: Cholecystectomy. No biliary ductal dilatation. SPLEEN: No acute abnormality. PANCREAS: No acute abnormality. ADRENAL GLANDS: 1.2 x 1.8 x 1.5 cm left adrenal mass was present but not  hypermetabolic on 12/26/2020, favoring benign etiology. KIDNEYS, URETERS AND BLADDER: Simple appearing bilateral renal cysts warrant no further imaging workup. No stones in the kidneys or ureters. No hydronephrosis. No perinephric or periureteral stranding. Urinary bladder is unremarkable. GI AND BOWEL: Stomach demonstrates no acute abnormality. There is no bowel obstruction. REPRODUCTIVE ORGANS: Fiducials along the right side of the prostate gland with low density material along the posterior margin of the prostate gland possibly reflecting hydrogel. PERITONEUM AND RETROPERITONEUM: No ascites. No free air. VASCULATURE: Aorta is normal in caliber. Systemic atherosclerosis is present, including the aorta and iliac arteries. ABDOMINAL AND PELVIS LYMPH NODES: No lymphadenopathy. BONES AND SOFT TISSUES: Stable scattered lucent and sclerotic bony lesions throughout the thoracic skeleton. Scattered mixed density lesions in the skeleton appear unchanged. Degenerative arthropathy of both hips with osteochondral lesions posteriorly along the femoral heads and questionable small osteochondral fragments in the right acetabular fossa. Fatty mass of the left quadratus femoris muscle with associated internal calcification, favoring benign lipoma. IMPRESSION: 1. Extensive filling defects compatible with acute pulmonary emboli in the left  main, left upper and lower lobe, right middle lobe, and right lower lobe pulmonary arteries, with right ventricular to left ventricular ratio 1.17 concerning for right heart strain consistent with at least submassive pulmonary embolus. 2. Stable scattered lucent and sclerotic osseous lesions throughout the thoracic skeleton, compatible with known metastatic or chronic osseous disease. 3. 1.2 x 1.8 x 1.5 cm left adrenal mass, previously non-hypermetabolic on 12/26/20, favoring benign etiology. 4. Mild scarring and superimposed atelectasis in the right lower lobe and mild scarring in the left lower  lobe. 5. Degenerative arthropathy of both hips with osteochondral lesions posteriorly along the femoral heads and possible small osteochondral fragments in the right acetabular fossa. 6. Simple-appearing bilateral renal cysts and fatty mass of the left quadratus femoris muscle with internal calcification, favoring benign lipoma. 7. Systemic atherosclerosis involving the chest aorta and iliac arteries and prior cholecystectomy. 8. Fiducial markers along the right side of the prostate gland with adjacent low density material along the posterior margin, possibly reflecting hydrogel. Professional radiology assistant personnel have been engaged to place me in direct telephone contact with the referring physician or the referring physician's covering provider. Once that has been established, I will addend this report with documentation of the communication. Electronically signed by: Ryan Salvage MD 01/18/2024 11:15 AM EST RP Workstation: [id number]    Micro Results     No results found for this or any previous visit (from the past 240 hours).  Today   Subjective    Maurice Fox today has no headache,no chest abdominal pain,no new weakness tingling or numbness, feels much better wants to go home today.     Objective   Blood pressure 122/73, pulse 75, temperature 98.3 F (36.8 C), temperature source Oral, resp. rate (!) 21, height 5' 10 (1.778 m), weight 111.1 kg, SpO2 98%.   Intake/Output Summary (Last 24 hours) at 01/21/2024 0956 Last data filed at 01/21/2024 0325 Gross per 24 hour  Intake 240 ml  Output 250 ml  Net -10 ml    Exam  Awake Alert, No new F.N deficits,    [place].AT,PERRAL Supple Neck,   Symmetrical Chest wall movement, Good air movement bilaterally, CTAB RRR,No Gallops,   +ve B.Sounds, Abd Soft, Non tender,  No Cyanosis, Clubbing or edema    Data Review   Recent Labs  Lab 01/18/24 1435 01/19/24 0549 01/20/24 0322 01/21/24 0321  WBC 6.7 5.8 8.4 6.0  HGB 13.6 13.4  13.1 12.5*  HCT 40.9 39.9 38.8* 37.4*  PLT 215 230 218 219  MCV 88.5 86.7 86.0 87.6  MCH 29.4 29.1 29.0 29.3  MCHC 33.3 33.6 33.8 33.4  RDW 14.0 14.1 13.9 13.9  LYMPHSABS  --   --   --  1.6  MONOABS  --   --   --  0.6  EOSABS  --   --   --  0.3  BASOSABS  --   --   --  0.1    Recent Labs  Lab 01/18/24 1435 01/18/24 1439 01/19/24 0549 01/20/24 1109 01/21/24 0321  NA 131*  --  137  --  137  K 4.5  --  4.2  --  3.9  CL 99  --  104  --  104  CO2 19*  --  23  --  21*  ANIONGAP 12  --  10  --  12  GLUCOSE 262*  --  218*  --  162*  BUN 25*  --  21  --  21  CREATININE 1.35*  --  1.19  --  1.22  LATICACIDVEN  --  1.3  --  1.2  --   INR 1.1  --   --   --   --   MG  --   --   --   --  2.5*  CALCIUM  9.0  --  9.1  --  8.8*    Total Time in preparing paper work, data evaluation and todays exam - 35 minutes  Signature  -    Lavada Stank M.D on 01/21/2024 at 9:56 AM   -  To page go to [email or web address]      "

## 2024-01-21 NOTE — Progress Notes (Addendum)
 PHARMACY - ANTICOAGULATION CONSULT NOTE  Pharmacy Consult for IV heparin  > Eliquis Indication: pulmonary embolus  Allergies[1]  Patient Measurements: Height: 5' 10 (177.8 cm) Weight: 111.1 kg (245 lb) IBW/kg (Calculated) : 73 HEPARIN  DW (KG): 97.2  Vital Signs: Temp: 97.5 F (36.4 C) (01/01 0416) Temp Source: Oral (01/01 0416) BP: 138/84 (01/01 0416) Pulse Rate: 79 (01/01 0416)  Labs: Recent Labs    01/18/24 1435 01/18/24 2329 01/19/24 0549 01/20/24 0322 01/21/24 0321  HGB 13.6  --  13.4 13.1 12.5*  HCT 40.9  --  39.9 38.8* 37.4*  PLT 215  --  230 218 219  APTT 37*  --   --   --   --   LABPROT 14.7  --   --   --   --   INR 1.1  --   --   --   --   HEPARINUNFRC  --    < > 0.65 0.54 0.52  CREATININE 1.35*  --  1.19  --  1.22   < > = values in this interval not displayed.    Estimated Creatinine Clearance: 67.3 mL/min (by C-G formula based on SCr of 1.22 mg/dL).   Medical History: Past Medical History:  Diagnosis Date   Arthritis    Cancer (HCC)    Hyperlipidemia    Hypertension    followed by pcp  (11-23-2019  per pt had stress test greater than 20 yrs ago, told ok)   Nocturia    OSA on CPAP    per pt uses nightly   Phimosis    Sleep apnea    Type 2 diabetes mellitus (HCC)    followed by pcp   (11-23-2019 per pt does not check blood sugar at home)   Wears glasses    Assessment: Maurice MADDY Sr. is a 74 y.o. year old male admitted on 01/18/2024 with concern for shortness of breath found to have PE. CTA with extensive clot burden noted and RHS present.No anticoagulation prior to admission. Pharmacy consulted to dose heparin .  Heparin  level this morning is within goal range.  No overt bleeding or complications noted.  Pharmacy asked to transition to Eliquis.  Goal of Therapy:  Monitor platelets by anticoagulation protocol: Yes   Plan:  Stop IV heparin  Start Eliquis 10 mg BID x 7 days, then Eliquis 5 mg BID> Will educate patient prior to  dc.  Harlene Barlow, Pharm D, BCPS, BCCP Clinical Pharmacist  01/21/2024 6:07 AM   [facility] pharmacy phone numbers are listed on [email or web address]      [1] No Known Allergies

## 2024-01-21 NOTE — Plan of Care (Signed)
  Problem: Education: Goal: Knowledge of General Education information will improve Description: Including pain rating scale, medication(s)/side effects and non-pharmacologic comfort measures Outcome: Progressing   Problem: Health Behavior/Discharge Planning: Goal: Ability to manage health-related needs will improve Outcome: Progressing   Problem: Clinical Measurements: Goal: Respiratory complications will improve Outcome: Progressing Goal: Cardiovascular complication will be avoided Outcome: Progressing   Problem: Nutrition: Goal: Adequate nutrition will be maintained Outcome: Progressing   Problem: Pain Managment: Goal: General experience of comfort will improve and/or be controlled Outcome: Progressing

## 2024-01-21 NOTE — Progress Notes (Signed)
" °   01/20/24 2300  BiPAP/CPAP/SIPAP  Reason BIPAP/CPAP not in use Non-compliant (Pt. refused. Pt. educated on why to wear machine.)    "

## 2024-01-21 NOTE — TOC Transition Note (Addendum)
 Transition of Care [facility]) - Discharge Note   Patient Details  Name: Maurice Fox. MRN: [id number] Date of Birth: [birth year]  [facility] CM/SW Contact:  Corean JAYSON Canary, RN Phone Number: 01/21/2024, 9:46 AM   Clinical Narrative:    Patient transitioning to eliquis ,pharmacy consult 47.00 per month Pulmonology outpatient appt, call the office on Monday  Noted SDOH food insecurity, housing and utilities, resources placed on AVS Final next level of care: Home/Self Care Barriers to Discharge: No Barriers Identified   Patient Goals and CMS Choice            Discharge Placement                       Discharge Plan and Services Additional resources added to the After Visit Summary for                                       Social Drivers of Health (SDOH) Interventions SDOH Screenings   Food Insecurity: Food Insecurity Present (01/18/2024)  Housing: Low Risk (01/18/2024)  Recent Concern: Housing - High Risk (11/16/2023)  Transportation Needs: No Transportation Needs (01/18/2024)  Utilities: Not At Risk (01/19/2024)  Recent Concern: Utilities - At Risk (11/16/2023)  Alcohol  Screen: Low Risk (11/16/2023)  Depression (PHQ2-9): Low Risk (11/16/2023)  Social Connections: Moderately Integrated (01/18/2024)  Tobacco Use: High Risk (01/18/2024)     Readmission Risk Interventions     No data to display

## 2024-01-22 ENCOUNTER — Other Ambulatory Visit (HOSPITAL_COMMUNITY): Payer: Self-pay

## 2024-01-22 ENCOUNTER — Ambulatory Visit

## 2024-01-22 ENCOUNTER — Other Ambulatory Visit (HOSPITAL_COMMUNITY): Payer: Self-pay | Admitting: Pharmacist

## 2024-01-22 ENCOUNTER — Encounter: Payer: Self-pay | Admitting: Hematology and Oncology

## 2024-01-22 ENCOUNTER — Other Ambulatory Visit (HOSPITAL_BASED_OUTPATIENT_CLINIC_OR_DEPARTMENT_OTHER): Payer: Self-pay

## 2024-01-22 MED ORDER — ELIQUIS DVT/PE STARTER PACK 5 MG PO TBPK
ORAL_TABLET | ORAL | 0 refills | Status: AC
  Filled 2024-01-22: qty 74, 30d supply, fill #0

## 2024-01-22 NOTE — Telephone Encounter (Signed)
 Copied from CRM #[id number]. Topic: Clinical - Prescription Issue >> Jan 22, 2024  9:10 AM Isabell A wrote: Reason for CRM:  Patient is requesting a coupon for the blood thinner her was prescribed.   Callback number: [phone]    ATC x1. Phone line kept beeping. Pt has not been seen here, has ov 04/13/2024.  Pt will need to go online for coupons of contact PCP. E2C2 PLEASE RELAY MY NOTE TO THE PATIENT IF HE CALLS BACK.

## 2024-01-22 NOTE — Telephone Encounter (Signed)
ATC x1.  LMTCB. 

## 2024-01-24 ENCOUNTER — Other Ambulatory Visit: Payer: Self-pay | Admitting: Hematology and Oncology

## 2024-01-24 ENCOUNTER — Ambulatory Visit

## 2024-01-24 DIAGNOSIS — C8442 Peripheral T-cell lymphoma, not classified, intrathoracic lymph nodes: Secondary | ICD-10-CM

## 2024-01-24 NOTE — Progress Notes (Unsigned)
 " [facility] [phone]   [phone]  PROGRESS NOTE  Patient Care Team: Maurice Almarie SAUNDERS, MD as PCP - General (Family Medicine) Maurice Slain, MD as PCP - Cardiology (Cardiology) Maurice Pont, RN as Oncology Nurse Navigator  Hematological/Oncological History # CD30-Positive T-cell Lymphoma, Stage III/IV 07/09/2020: patient underwent cholecystectomy. Liver wedge biopsy during the procedure revealed involvement of a CD30-positive T-cell lymphoproliferative disorder 07/25/2020: establish care with Dr. Federico  08/08/2020: PET CT scan showed innumerable hypermetabolic lesions (primarily Deauville 4) in the liver and skeleton. Left neck adenopathy including a dominant level IB lymph node which is Deauville 5. 08/15/2020: Cycle 1 Day 1 of BV-CHP  09/04/2020: Cycle 2 Day 1 of BV-CHP 09/27/2020:  Cycle 3 Day 1 of BV-CHP 10/18/2020: Cycle 4 Day 1 of BV-CHP 11/08/2020: Cycle 5 Day 1 of BV-CHP 11/29/2020: Cycle 6 Day 1 of BV-CHP 12/26/2020: PET CT scan showed further improvement compared to the 11/05/2020 exam, with mild reduction in activity in the extensive scattered mixed density skeletal lesions (although still Deauville 4) and no substantial hypermetabolic adenopathy currently identified 02/11/2021: Bone marrow biopsy performed due to concern for FDG avidity of bone marrow.  No evidence of residual disease noted on bone marrow biopsy.  Interval History:  Maurice TANZI Sr. 74 y.o. male with medical history significant for T cell lymphoma (Stage III/IV) presents for a follow up visit. The patient's last visit was on 07/23/2023.  In the interim since that time he underwent routine CT scan on 01/18/2024 and was found to have an incidental massive pulmonary embolus.  He was started on Eliquis  therapy since that time.  On exam today Maurice Fox reports he is feeling much better now that he is under going anticoagulation therapy for his blood clot.  He reports he is  taking the Eliquis  as a starter pack right now with 2 in the morning and 2 at night.  He will be transitioning shortly to the 5 mg twice daily.  He reports that the first starter pack was free but that the subsequent medication will cost him to a $97 until he reaches his deductible.  Unfortunately [facility] and his insurance were not able to help bridge the gap.  He reports that he otherwise feels like his energy levels are okay.  He is not having any chest pain or shortness of breath.  He did get his flu shot this year.  He said no other major changes in his health and denies any nausea, Oni, or diarrhea.  He reports that there were no clear provoking factors before he developed his blood clot.  He reports that he had not had any surgeries, immobility, or prolonged travel.  At this time findings are most consistent with an unprovoked blood clot.  14 point ROS was otherwise negative.  MEDICAL HISTORY:  Past Medical History:  Diagnosis Date   Arthritis    Cancer (HCC)    Hyperlipidemia    Hypertension    followed by pcp  (11-23-2019  per pt had stress test greater than 20 yrs ago, told ok)   Nocturia    OSA on CPAP    per pt uses nightly   Phimosis    Sleep apnea    Type 2 diabetes mellitus (HCC)    followed by pcp   (11-23-2019 per pt does not check blood sugar at home)   Wears glasses     SURGICAL HISTORY: Past Surgical History:  Procedure Laterality Date   APPENDECTOMY  child  CHOLECYSTECTOMY N/A 07/09/2020   Procedure: LAPAROSCOPIC CHOLECYSTECTOMY WITH INTRAOPERATIVE CHOLANGIOGRAM,;  Surgeon: Tanda Locus, MD;  Location: [facility];  Service: General;  Laterality: N/A;   CIRCUMCISION N/A 11/29/2019   Procedure: CIRCUMCISION ADULT;  Surgeon: Rosalind Zachary NOVAK, MD;  Location: [facility];  Service: Urology;  Laterality: N/A;   GOLD SEED IMPLANT N/A 01/05/2024   Procedure: INSERTION, GOLD SEEDS;  Surgeon: [name] Maurice Righter, MD;  Location[facility];  Service: Urology;   Laterality: N/A;  GOLD SEED IMPLANTS AND SPACEOAR   IR IMAGING GUIDED PORT INSERTION  08/06/2020   IR REMOVAL TUN ACCESS W/ PORT W/O FL MOD SED  04/09/2021   LIVER BIOPSY N/A 07/09/2020   Procedure: LAP LIVER BIOPSY;  Surgeon: Tanda Locus, MD;  Location: [facility];  Service: General;  Laterality: N/A;   ROTATOR CUFF REPAIR Left chld   SPACE OAR INSTILLATION N/A 01/05/2024   Procedure: INJECTION, HYDROGEL SPACER;  Surgeon: [name] Maurice Righter, MD;  Location: [facility] OR;  Service: Urology;  Laterality: N/A;   SUPRA-UMBILICAL HERNIA N/A 07/09/2020   Procedure: PRIMARY REPAIR SUPRA-UMBILICAL HERNIA;  Surgeon: Tanda Locus, MD;  Location: [facility];  Service: General;  Laterality: N/A;    SOCIAL HISTORY: Social History   Socioeconomic History   Marital status: Divorced    Spouse name: Not on file   Number of children: Not on file   Years of education: Not on file   Highest education level: Not on file  Occupational History   Not on file  Tobacco Use   Smoking status: Every Day    Current packs/day: 1.00    Average packs/day: 1 pack/day for 49.0 years (49.0 ttl pk-yrs)    Types: Cigars, Cigarettes   Smokeless tobacco: Never   Tobacco comments:    09/03/2023 Patient smokes 10 little cigars daily  Vaping Use   Vaping status: Never Used  Substance and Sexual Activity   Alcohol  use: Not Currently   Drug use: Never   Sexual activity: Not on file  Other Topics Concern   Not on file  Social History Narrative   Not on file   Social Drivers of Health   Tobacco Use: High Risk (01/18/2024)   Patient History    Smoking Tobacco Use: Every Day    Smokeless Tobacco Use: Never    Passive Exposure: Not on file  Financial Resource Strain: Not on file  Food Insecurity: Food Insecurity Present (01/18/2024)   Epic    Worried About Programme Researcher, Broadcasting/film/video in the Last Year: Often true    Ran Out of Food in the Last Year: Often true  Transportation Needs: No Transportation Needs (01/18/2024)   Epic     Lack of Transportation (Medical): No    Lack of Transportation (Non-Medical): No  Physical Activity: Not on file  Stress: Not on file  Social Connections: Moderately Integrated (01/18/2024)   Social Connection and Isolation Panel    Frequency of Communication with Friends and Family: More than three times a week    Frequency of Social Gatherings with Friends and Family: More than three times a week    Attends Religious Services: More than 4 times per year    Active Member of [employer] or Organizations: Yes    Attends Banker Meetings: More than 4 times per year    Marital Status: Divorced  Intimate Partner Violence: Not At Risk (01/18/2024)   Epic    Fear of Current or Ex-Partner: No    Emotionally Abused: No  Physically Abused: No    Sexually Abused: No  Depression (PHQ2-9): Low Risk (01/25/2024)   Depression (PHQ2-9)    PHQ-2 Score: 0  Alcohol  Screen: Low Risk (11/16/2023)   Alcohol  Screen    Last Alcohol  Screening Score (AUDIT): 0  Housing: Low Risk (01/18/2024)   Epic    Unable to Pay for Housing in the Last Year: No    Number of Times Moved in the Last Year: 0    Homeless in the Last Year: No  Recent Concern: Housing - High Risk (11/16/2023)   Epic    Unable to Pay for Housing in the Last Year: Yes    Number of Times Moved in the Last Year: 1    Homeless in the Last Year: No  Utilities: Not At Risk (01/19/2024)   Epic    Threatened with loss of utilities: No  Recent Concern: Utilities - At Risk (11/16/2023)   Epic    Threatened with loss of utilities: Yes  Health Literacy: Not on file    FAMILY HISTORY: Family History  Problem Relation Age of Onset   Hypotension Mother    Diabetes Mellitus II Father    Colon cancer Sister    Prostate cancer Brother    Prostate cancer Brother     ALLERGIES:  has no known allergies.  MEDICATIONS:  Current Outpatient Medications  Medication Sig Dispense Refill   albuterol  (VENTOLIN  HFA) 108 (90 Base) MCG/ACT  inhaler Inhale 2 puffs into the lungs every 4 (four) hours as needed for shortness of breath or wheezing.     amLODipine  (NORVASC ) 2.5 MG tablet Take 1 tablet (2.5 mg total) by mouth daily. 90 tablet 1   apixaban  (ELIQUIS ) 5 MG TABS tablet Take 2 tablets (10 mg total) by mouth 2 (two) times daily for 7 days, THEN 1 tablet (5 mg total) 2 (two) times daily for 23 days. 74 tablet 0   Apixaban  Starter Pack, 10mg  and 5mg , (ELIQUIS  DVT/PE STARTER PACK) Take as directed per package insructions 74 each 0   azelastine  (ASTELIN ) 0.1 % nasal spray Place 2 sprays into both nostrils 2 (two) times daily as needed. 30 mL 11   buPROPion  (WELLBUTRIN  XL) 300 MG 24 hr tablet Take 1 tablet (300 mg total) by mouth every morning. 90 tablet 3   Continuous Glucose Sensor (FREESTYLE LIBRE 3 PLUS SENSOR) MISC Use as directed. Replace sensor every 15 days 2 each 11   diclofenac  Sodium (VOLTAREN ) 1 % GEL Apply 4 g topically to the affected area(s) 4 (four) times daily. 500 g 3   Dulaglutide  (TRULICITY ) 0.75 MG/0.5ML SOAJ Inject 0.75 mg into the skin once a week. 2 mL 1   insulin  glargine (LANTUS  SOLOSTAR) 100 UNIT/ML Solostar Pen Inject 36 Units into the skin daily. 15 mL 2   Insulin  Pen Needle 32G X 4 MM MISC Use with Lantus  every day. 100 each 11   metFORMIN (GLUCOPHAGE) 1000 MG tablet Take 1,000 mg by mouth 2 (two) times daily.     Multiple Vitamins-Minerals (CENTRUM SILVER PO) Take 1 tablet by mouth at bedtime.     olopatadine (PATANOL) 0.1 % ophthalmic solution Apply to eye.     ORTHOVISC 30 MG/2ML SOSY intra-articular injection      rosuvastatin  (CRESTOR ) 10 MG tablet Take 1 tablet (10 mg total) by mouth at bedtime for cholesterol. 90 tablet 3   RYBELSUS  7 MG TABS      valsartan -hydrochlorothiazide  (DIOVAN -HCT) 320-25 MG tablet Take 1 tablet by mouth daily. 90 tablet  1   No current facility-administered medications for this visit.    REVIEW OF SYSTEMS:   Constitutional: ( - ) fevers, ( - )  chills , ( - ) night  sweats Eyes: ( - ) blurriness of vision, ( - ) double vision, ( - ) watery eyes Ears, nose, mouth, throat, and face: ( - ) mucositis, ( - ) sore throat Respiratory: ( - ) cough, ( - ) dyspnea, ( - ) wheezes Cardiovascular: ( - ) palpitation, ( - ) chest discomfort, ( - ) lower extremity swelling Gastrointestinal:  ( - ) nausea, ( - ) heartburn, ( - ) change in bowel habits Skin: ( - ) abnormal skin rashes Lymphatics: ( - ) new lymphadenopathy, ( - ) easy bruising Neurological: ( - ) numbness, ( - ) tingling, ( - ) new weaknesses Behavioral/Psych: ( - ) mood change, ( - ) new changes  All other systems were reviewed with the patient and are negative.  PHYSICAL EXAMINATION: ECOG PERFORMANCE STATUS: 1 - Symptomatic but completely ambulatory  Vitals:   01/25/24 1003  BP: 133/80  Pulse: 84  Resp: 17  Temp: (!) 97.1 F (36.2 C)  SpO2: 100%    Filed Weights   01/25/24 1003  Weight: 249 lb 14.4 oz (113.4 kg)   GENERAL: Well-appearing elderly African-American male, alert, no distress and comfortable SKIN: skin color, texture, turgor are normal, no rashes or significant lesions EYES: conjunctiva are pink and non-injected, sclera clear NECK: supple, non-tender LUNGS: clear to auscultation and percussion with normal breathing effort HEART: regular rate & rhythm and no murmurs and no lower extremity edema PSYCH: alert & oriented x 3, fluent speech NEURO: no focal motor/sensory deficits  LABORATORY DATA:  I have reviewed the data as listed    Latest Ref Rng & Units 01/25/2024    9:12 AM 01/21/2024    3:21 AM 01/20/2024    3:22 AM  CBC  WBC 4.0 - 10.5 K/uL 5.5  6.0  8.4   Hemoglobin 13.0 - 17.0 g/dL 87.0  87.4  86.8   Hematocrit 39.0 - 52.0 % 38.9  37.4  38.8   Platelets 150 - 400 K/uL 259  219  218        Latest Ref Rng & Units 01/25/2024    9:12 AM 01/21/2024    3:21 AM 01/19/2024    5:49 AM  CMP  Glucose 70 - 99 mg/dL 862  837  781   BUN 8 - 23 mg/dL 19  21  21    Creatinine  0.61 - 1.24 mg/dL 8.81  8.77  8.80   Sodium 135 - 145 mmol/L 140  137  137   Potassium 3.5 - 5.1 mmol/L 4.3  3.9  4.2   Chloride 98 - 111 mmol/L 105  104  104   CO2 22 - 32 mmol/L 26  21  23    Calcium  8.9 - 10.3 mg/dL 9.4  8.8  9.1   Total Protein 6.5 - 8.1 g/dL 7.5     Total Bilirubin 0.0 - 1.2 mg/dL 0.4     Alkaline Phos 38 - 126 U/L 96     AST 15 - 41 U/L 32     ALT 0 - 44 U/L 35      RADIOGRAPHIC STUDIES: No images were reviewed during this visit.   ASSESSMENT & PLAN Maurice Fox is a 74 y.o. male with medical history significant for T cell lymphoma (Stage III/IV) presents for a follow up  visit.  After review of the labs, review of the records, and discussion with the patient the patients findings are most consistent with a peripheral CD30 positive T-cell lymphoma.  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  The patient has been staged with a PET CT scan which showed clear involvement of the skeleton and liver, most consistent with stage IV disease.  Given these findings we will plan for 6 cycles of BV CHP chemotherapy.  The regimen of BV CHP consists of brentuximab 1.8 mg/kg IV on day 1, cyclophosphamide  750 mg per metered squared on day 1, doxorubicin  50 mg per metered squared on day 1, and prednisone  60 mg p.o. day 1 through 5.  This is to be continued every 21 days with a plan for up to 6 cycles.  After the third or fourth cycle we will consider restaging PET CT scan in order to evaluate for response.  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 --Received 6 cycles of BV-CHP from 08/15/2020-11/29/2020.  --PET CT scan performed on 11/05/2020, findings show Deauville 2 disease with some Deauville 4 noted in the skeleton. Post treatment PET after the last cycle  showed diminished FDG avidity, but still Deauville 4.  -- Bone marrow biopsy on 02/11/2021 shows no evidence of residual disease. -- Per NCCN recommendations we will have the patient return to clinic every 3 to 6 months with imaging on a 1-month basis for the first 2 years PLAN: --Labs today show white blood cell count 5.5, hemoglobin 12.9, MCV 87, platelets 259 --last CT scan in Dec 2025 showed no evidence of residual/recurrent disease, though massive PE was noted.  --Return to clinic in  6 months  # Submassive Pulmonary Embolism without Core Pulmonale -- Routine follow-up CT scan of the chest abdomen pelvis on 01/18/2024 showed a submassive pulmonary embolism -- Went to the emergency department and was not found to have heart strain on echocardiogram.  No thrombolysis required. -- Started on Eliquis  therapy, recommend to continue for at least 6 months time.  #Dark Tarry Stools # Hemoglobin Drop  --Underwent EGD and colonoscopy on 03/17/2023. Colonoscopy found several polyps in the ascending colon, transverse colon, descending colon and rectum. In addition there were non-bleeding internal hemorrhoids. EGD showed esophgeal mucosal variant, small hiatal hernia and gastritis with hemorrhage, dudenitis.  -- Responded well to IV iron  therapy, hemoglobin normalized.  #Erectile Dysfunction -- Patient notes Viagra is not working for him.  We provided prescription for Cialis .  -- If this remains ineffective would make referral to urology.  #Supportive Care -- port removed 04/09/2021  No orders of the defined types were placed in this encounter.  All questions were answered. The patient knows to call the clinic with any problems, questions or concerns.  A total of more than 30 minutes were spent on this encounter with face-to-face time and non-face-to-face time, including preparing to see the patient, ordering medications, counseling the patient and coordination of care as outlined above.   Norleen IVAR Kidney, MD Department of Hematology/Oncology [facility] Phone: [phone] Pager: [phone] Email: [email or web address]    01/25/2024 4:42 PM "

## 2024-01-25 ENCOUNTER — Inpatient Hospital Stay (HOSPITAL_BASED_OUTPATIENT_CLINIC_OR_DEPARTMENT_OTHER): Admitting: Hematology and Oncology

## 2024-01-25 ENCOUNTER — Ambulatory Visit
Admission: RE | Admit: 2024-01-25 | Discharge: 2024-01-25 | Disposition: A | Discharge status: Home / Self Care | Source: Ambulatory Visit | Attending: Radiation Oncology | Admitting: Radiation Oncology

## 2024-01-25 ENCOUNTER — Inpatient Hospital Stay

## 2024-01-25 ENCOUNTER — Other Ambulatory Visit: Payer: Self-pay

## 2024-01-25 ENCOUNTER — Ambulatory Visit

## 2024-01-25 VITALS — BP 133/80 | HR 84 | Temp 97.1°F | Resp 17 | Wt 249.9 lb

## 2024-01-25 DIAGNOSIS — C8442 Peripheral T-cell lymphoma, not classified, intrathoracic lymph nodes: Secondary | ICD-10-CM

## 2024-01-25 DIAGNOSIS — C8470 Anaplastic large cell lymphoma, ALK-negative, unspecified site: Secondary | ICD-10-CM | POA: Insufficient documentation

## 2024-01-25 DIAGNOSIS — Z7901 Long term (current) use of anticoagulants: Secondary | ICD-10-CM | POA: Insufficient documentation

## 2024-01-25 DIAGNOSIS — F17210 Nicotine dependence, cigarettes, uncomplicated: Secondary | ICD-10-CM | POA: Insufficient documentation

## 2024-01-25 DIAGNOSIS — Z510 Encounter for antineoplastic radiation therapy: Secondary | ICD-10-CM | POA: Insufficient documentation

## 2024-01-25 DIAGNOSIS — I2699 Other pulmonary embolism without acute cor pulmonale: Secondary | ICD-10-CM | POA: Diagnosis not present

## 2024-01-25 DIAGNOSIS — Z86711 Personal history of pulmonary embolism: Secondary | ICD-10-CM | POA: Insufficient documentation

## 2024-01-25 DIAGNOSIS — N529 Male erectile dysfunction, unspecified: Secondary | ICD-10-CM | POA: Insufficient documentation

## 2024-01-25 DIAGNOSIS — C8499 Mature T/NK-cell lymphomas, unspecified, extranodal and solid organ sites: Secondary | ICD-10-CM

## 2024-01-25 DIAGNOSIS — Z79899 Other long term (current) drug therapy: Secondary | ICD-10-CM | POA: Insufficient documentation

## 2024-01-25 DIAGNOSIS — C61 Malignant neoplasm of prostate: Secondary | ICD-10-CM | POA: Insufficient documentation

## 2024-01-25 LAB — CMP (CANCER CENTER ONLY)
ALT: 35 U/L (ref 0–44)
AST: 32 U/L (ref 15–41)
Albumin: 4.2 g/dL (ref 3.5–5.0)
Alkaline Phosphatase: 96 U/L (ref 38–126)
Anion gap: 10 (ref 5–15)
BUN: 19 mg/dL (ref 8–23)
CO2: 26 mmol/L (ref 22–32)
Calcium: 9.4 mg/dL (ref 8.9–10.3)
Chloride: 105 mmol/L (ref 98–111)
Creatinine: 1.18 mg/dL (ref 0.61–1.24)
GFR, Estimated: >60 mL/min
Glucose, Bld: 137 mg/dL — ABNORMAL HIGH (ref 70–99)
Potassium: 4.3 mmol/L (ref 3.5–5.1)
Sodium: 140 mmol/L (ref 135–145)
Total Bilirubin: 0.4 mg/dL (ref 0.0–1.2)
Total Protein: 7.5 g/dL (ref 6.5–8.1)

## 2024-01-25 LAB — CBC WITH DIFFERENTIAL (CANCER CENTER ONLY)
Abs Immature Granulocytes: 0.01 K/uL (ref 0.00–0.07)
Basophils Absolute: 0.1 K/uL (ref 0.0–0.1)
Basophils Relative: 1 %
Eosinophils Absolute: 0.2 K/uL (ref 0.0–0.5)
Eosinophils Relative: 3 %
HCT: 38.9 % — ABNORMAL LOW (ref 39.0–52.0)
Hemoglobin: 12.9 g/dL — ABNORMAL LOW (ref 13.0–17.0)
Immature Granulocytes: 0 %
Lymphocytes Relative: 31 %
Lymphs Abs: 1.7 K/uL (ref 0.7–4.0)
MCH: 28.9 pg (ref 26.0–34.0)
MCHC: 33.2 g/dL (ref 30.0–36.0)
MCV: 87 fL (ref 80.0–100.0)
Monocytes Absolute: 0.5 K/uL (ref 0.1–1.0)
Monocytes Relative: 9 %
Neutro Abs: 3 K/uL (ref 1.7–7.7)
Neutrophils Relative %: 56 %
Platelet Count: 259 K/uL (ref 150–400)
RBC: 4.47 MIL/uL (ref 4.22–5.81)
RDW: 14.4 % (ref 11.5–15.5)
WBC Count: 5.5 K/uL (ref 4.0–10.5)
nRBC: 0 % (ref 0.0–0.2)

## 2024-01-25 LAB — RAD ONC ARIA SESSION SUMMARY
Course Elapsed Days: 0
Plan Fractions Treated to Date: 1
Plan Prescribed Dose Per Fraction: 2.5 Gy
Plan Total Fractions Prescribed: 28
Plan Total Prescribed Dose: 70 Gy
Reference Point Dosage Given to Date: 2.5 Gy
Reference Point Session Dosage Given: 2.5 Gy
Session Number: 1

## 2024-01-25 LAB — LACTATE DEHYDROGENASE: LDH: 230 U/L (ref 105–235)

## 2024-01-25 LAB — RETIC PANEL
Immature Retic Fract: 21.4 % — ABNORMAL HIGH (ref 2.3–15.9)
RBC.: 4.45 MIL/uL (ref 4.22–5.81)
Retic Count, Absolute: 71.2 K/uL (ref 19.0–186.0)
Retic Ct Pct: 1.6 % (ref 0.4–3.1)
Reticulocyte Hemoglobin: 33 pg

## 2024-01-25 LAB — IRON AND IRON BINDING CAPACITY (CC-WL,HP ONLY)
Iron: 60 ug/dL (ref 45–182)
Saturation Ratios: 18 % (ref 17.9–39.5)
TIBC: 329 ug/dL (ref 250–450)
UIBC: 269 ug/dL

## 2024-01-25 LAB — FERRITIN: Ferritin: 382 ng/mL — ABNORMAL HIGH (ref 24–336)

## 2024-01-26 ENCOUNTER — Ambulatory Visit
Admission: RE | Admit: 2024-01-26 | Discharge: 2024-01-26 | Disposition: A | Source: Ambulatory Visit | Attending: Radiation Oncology

## 2024-01-26 ENCOUNTER — Ambulatory Visit

## 2024-01-26 ENCOUNTER — Other Ambulatory Visit: Payer: Self-pay

## 2024-01-26 ENCOUNTER — Other Ambulatory Visit (HOSPITAL_BASED_OUTPATIENT_CLINIC_OR_DEPARTMENT_OTHER): Payer: Self-pay

## 2024-01-26 LAB — RAD ONC ARIA SESSION SUMMARY
Course Elapsed Days: 1
Plan Fractions Treated to Date: 2
Plan Prescribed Dose Per Fraction: 2.5 Gy
Plan Total Fractions Prescribed: 28
Plan Total Prescribed Dose: 70 Gy
Reference Point Dosage Given to Date: 5 Gy
Reference Point Session Dosage Given: 2.5 Gy
Session Number: 2

## 2024-01-26 MED ORDER — ELIQUIS 5 MG PO TABS
5.0000 mg | ORAL_TABLET | Freq: Two times a day (BID) | ORAL | 3 refills | Status: AC
  Filled 2024-01-26: qty 60, 30d supply, fill #0

## 2024-01-26 MED ORDER — OZEMPIC (0.25 OR 0.5 MG/DOSE) 2 MG/3ML ~~LOC~~ SOPN
0.2500 mg | PEN_INJECTOR | SUBCUTANEOUS | 2 refills | Status: AC
  Filled 2024-01-26: qty 3, 28d supply, fill #0

## 2024-01-27 ENCOUNTER — Ambulatory Visit
Admission: RE | Admit: 2024-01-27 | Discharge: 2024-01-27 | Disposition: A | Discharge status: Home / Self Care | Source: Ambulatory Visit | Attending: Radiation Oncology | Admitting: Radiation Oncology

## 2024-01-27 ENCOUNTER — Ambulatory Visit

## 2024-01-27 ENCOUNTER — Other Ambulatory Visit: Payer: Self-pay

## 2024-01-27 LAB — RAD ONC ARIA SESSION SUMMARY
Course Elapsed Days: 2
Plan Fractions Treated to Date: 3
Plan Prescribed Dose Per Fraction: 2.5 Gy
Plan Total Fractions Prescribed: 28
Plan Total Prescribed Dose: 70 Gy
Reference Point Dosage Given to Date: 7.5 Gy
Reference Point Session Dosage Given: 2.5 Gy
Session Number: 3

## 2024-01-28 ENCOUNTER — Other Ambulatory Visit: Payer: Self-pay

## 2024-01-28 ENCOUNTER — Ambulatory Visit

## 2024-01-28 ENCOUNTER — Ambulatory Visit
Admission: RE | Admit: 2024-01-28 | Discharge: 2024-01-28 | Disposition: A | Discharge status: Home / Self Care | Source: Ambulatory Visit | Attending: Radiation Oncology | Admitting: Radiation Oncology

## 2024-01-28 LAB — RAD ONC ARIA SESSION SUMMARY
Course Elapsed Days: 3
Plan Fractions Treated to Date: 4
Plan Prescribed Dose Per Fraction: 2.5 Gy
Plan Total Fractions Prescribed: 28
Plan Total Prescribed Dose: 70 Gy
Reference Point Dosage Given to Date: 10 Gy
Reference Point Session Dosage Given: 2.5 Gy
Session Number: 4

## 2024-01-29 ENCOUNTER — Ambulatory Visit
Admission: RE | Admit: 2024-01-29 | Discharge: 2024-01-29 | Disposition: A | Source: Ambulatory Visit | Attending: Radiation Oncology

## 2024-01-29 ENCOUNTER — Other Ambulatory Visit: Payer: Self-pay

## 2024-01-29 ENCOUNTER — Ambulatory Visit

## 2024-01-29 ENCOUNTER — Ambulatory Visit: Payer: Self-pay | Admitting: *Deleted

## 2024-01-29 LAB — RAD ONC ARIA SESSION SUMMARY
Course Elapsed Days: 4
Plan Fractions Treated to Date: 5
Plan Prescribed Dose Per Fraction: 2.5 Gy
Plan Total Fractions Prescribed: 28
Plan Total Prescribed Dose: 70 Gy
Reference Point Dosage Given to Date: 12.5 Gy
Reference Point Session Dosage Given: 2.5 Gy
Session Number: 5

## 2024-02-01 ENCOUNTER — Ambulatory Visit

## 2024-02-01 ENCOUNTER — Ambulatory Visit
Admission: RE | Admit: 2024-02-01 | Discharge: 2024-02-01 | Disposition: A | Source: Ambulatory Visit | Attending: Radiation Oncology

## 2024-02-01 ENCOUNTER — Telehealth: Payer: Self-pay | Admitting: Physician Assistant

## 2024-02-01 ENCOUNTER — Other Ambulatory Visit: Payer: Self-pay

## 2024-02-01 LAB — RAD ONC ARIA SESSION SUMMARY
Course Elapsed Days: 7
Plan Fractions Treated to Date: 6
Plan Prescribed Dose Per Fraction: 2.5 Gy
Plan Total Fractions Prescribed: 28
Plan Total Prescribed Dose: 70 Gy
Reference Point Dosage Given to Date: 15 Gy
Reference Point Session Dosage Given: 2.5 Gy
Session Number: 6

## 2024-02-01 NOTE — Telephone Encounter (Signed)
 spoke with pt regarding scheduling appt. Pt is aware of date and time of new appt.

## 2024-02-02 ENCOUNTER — Ambulatory Visit
Admission: RE | Admit: 2024-02-02 | Discharge: 2024-02-02 | Disposition: A | Source: Ambulatory Visit | Attending: Radiation Oncology

## 2024-02-02 ENCOUNTER — Other Ambulatory Visit: Payer: Self-pay

## 2024-02-02 ENCOUNTER — Ambulatory Visit

## 2024-02-02 LAB — RAD ONC ARIA SESSION SUMMARY
Course Elapsed Days: 8
Plan Fractions Treated to Date: 7
Plan Prescribed Dose Per Fraction: 2.5 Gy
Plan Total Fractions Prescribed: 28
Plan Total Prescribed Dose: 70 Gy
Reference Point Dosage Given to Date: 17.5 Gy
Reference Point Session Dosage Given: 2.5 Gy
Session Number: 7

## 2024-02-03 ENCOUNTER — Ambulatory Visit
Admission: RE | Admit: 2024-02-03 | Discharge: 2024-02-03 | Disposition: A | Discharge status: Home / Self Care | Source: Ambulatory Visit | Attending: Radiation Oncology | Admitting: Radiation Oncology

## 2024-02-03 ENCOUNTER — Ambulatory Visit

## 2024-02-03 ENCOUNTER — Other Ambulatory Visit: Payer: Self-pay

## 2024-02-03 LAB — RAD ONC ARIA SESSION SUMMARY
Course Elapsed Days: 9
Plan Fractions Treated to Date: 8
Plan Prescribed Dose Per Fraction: 2.5 Gy
Plan Total Fractions Prescribed: 28
Plan Total Prescribed Dose: 70 Gy
Reference Point Dosage Given to Date: 20 Gy
Reference Point Session Dosage Given: 2.5 Gy
Session Number: 8

## 2024-02-04 ENCOUNTER — Other Ambulatory Visit: Payer: Self-pay

## 2024-02-04 ENCOUNTER — Ambulatory Visit
Admission: RE | Admit: 2024-02-04 | Discharge: 2024-02-04 | Disposition: A | Source: Ambulatory Visit | Attending: Radiation Oncology

## 2024-02-04 ENCOUNTER — Ambulatory Visit

## 2024-02-04 LAB — RAD ONC ARIA SESSION SUMMARY
Course Elapsed Days: 10
Plan Fractions Treated to Date: 9
Plan Prescribed Dose Per Fraction: 2.5 Gy
Plan Total Fractions Prescribed: 28
Plan Total Prescribed Dose: 70 Gy
Reference Point Dosage Given to Date: 22.5 Gy
Reference Point Session Dosage Given: 2.5 Gy
Session Number: 9

## 2024-02-05 ENCOUNTER — Ambulatory Visit
Admission: RE | Admit: 2024-02-05 | Discharge: 2024-02-05 | Disposition: A | Discharge status: Home / Self Care | Source: Ambulatory Visit | Attending: Radiation Oncology | Admitting: Radiation Oncology

## 2024-02-05 ENCOUNTER — Ambulatory Visit

## 2024-02-05 ENCOUNTER — Other Ambulatory Visit: Payer: Self-pay

## 2024-02-05 LAB — RAD ONC ARIA SESSION SUMMARY
Course Elapsed Days: 11
Plan Fractions Treated to Date: 10
Plan Prescribed Dose Per Fraction: 2.5 Gy
Plan Total Fractions Prescribed: 28
Plan Total Prescribed Dose: 70 Gy
Reference Point Dosage Given to Date: 25 Gy
Reference Point Session Dosage Given: 2.5 Gy
Session Number: 10

## 2024-02-06 ENCOUNTER — Other Ambulatory Visit (HOSPITAL_BASED_OUTPATIENT_CLINIC_OR_DEPARTMENT_OTHER): Payer: Self-pay

## 2024-02-08 ENCOUNTER — Other Ambulatory Visit: Payer: Self-pay

## 2024-02-08 ENCOUNTER — Ambulatory Visit
Admission: RE | Admit: 2024-02-08 | Discharge: 2024-02-08 | Disposition: A | Source: Ambulatory Visit | Attending: Radiation Oncology

## 2024-02-08 ENCOUNTER — Other Ambulatory Visit (HOSPITAL_BASED_OUTPATIENT_CLINIC_OR_DEPARTMENT_OTHER): Payer: Self-pay

## 2024-02-08 ENCOUNTER — Ambulatory Visit

## 2024-02-08 LAB — RAD ONC ARIA SESSION SUMMARY
Course Elapsed Days: 14
Plan Fractions Treated to Date: 11
Plan Prescribed Dose Per Fraction: 2.5 Gy
Plan Total Fractions Prescribed: 28
Plan Total Prescribed Dose: 70 Gy
Reference Point Dosage Given to Date: 27.5 Gy
Reference Point Session Dosage Given: 2.5 Gy
Session Number: 11

## 2024-02-09 ENCOUNTER — Other Ambulatory Visit: Payer: Self-pay

## 2024-02-09 ENCOUNTER — Ambulatory Visit

## 2024-02-09 ENCOUNTER — Ambulatory Visit
Admission: RE | Admit: 2024-02-09 | Discharge: 2024-02-09 | Disposition: A | Source: Ambulatory Visit | Attending: Radiation Oncology

## 2024-02-09 LAB — RAD ONC ARIA SESSION SUMMARY
Course Elapsed Days: 15
Plan Fractions Treated to Date: 12
Plan Prescribed Dose Per Fraction: 2.5 Gy
Plan Total Fractions Prescribed: 28
Plan Total Prescribed Dose: 70 Gy
Reference Point Dosage Given to Date: 30 Gy
Reference Point Session Dosage Given: 2.5 Gy
Session Number: 12

## 2024-02-10 ENCOUNTER — Ambulatory Visit
Admission: RE | Admit: 2024-02-10 | Discharge: 2024-02-10 | Disposition: A | Source: Ambulatory Visit | Attending: Radiation Oncology

## 2024-02-10 ENCOUNTER — Ambulatory Visit

## 2024-02-10 ENCOUNTER — Other Ambulatory Visit: Payer: Self-pay

## 2024-02-10 LAB — RAD ONC ARIA SESSION SUMMARY
Course Elapsed Days: 16
Plan Fractions Treated to Date: 13
Plan Prescribed Dose Per Fraction: 2.5 Gy
Plan Total Fractions Prescribed: 28
Plan Total Prescribed Dose: 70 Gy
Reference Point Dosage Given to Date: 32.5 Gy
Reference Point Session Dosage Given: 2.5 Gy
Session Number: 13

## 2024-02-11 ENCOUNTER — Other Ambulatory Visit: Payer: Self-pay

## 2024-02-11 ENCOUNTER — Other Ambulatory Visit (HOSPITAL_BASED_OUTPATIENT_CLINIC_OR_DEPARTMENT_OTHER): Payer: Self-pay

## 2024-02-11 ENCOUNTER — Ambulatory Visit
Admission: RE | Admit: 2024-02-11 | Discharge: 2024-02-11 | Disposition: A | Discharge status: Home / Self Care | Source: Ambulatory Visit | Attending: Radiation Oncology | Admitting: Radiation Oncology

## 2024-02-11 LAB — RAD ONC ARIA SESSION SUMMARY
Course Elapsed Days: 17
Plan Fractions Treated to Date: 14
Plan Prescribed Dose Per Fraction: 2.5 Gy
Plan Total Fractions Prescribed: 28
Plan Total Prescribed Dose: 70 Gy
Reference Point Dosage Given to Date: 35 Gy
Reference Point Session Dosage Given: 2.5 Gy
Session Number: 14

## 2024-02-11 MED ORDER — LANTUS SOLOSTAR 100 UNIT/ML ~~LOC~~ SOPN
44.0000 [IU] | PEN_INJECTOR | Freq: Every day | SUBCUTANEOUS | 2 refills | Status: AC
  Filled 2024-02-11: qty 15, 34d supply, fill #0

## 2024-02-11 MED ORDER — XARELTO 20 MG PO TABS
20.0000 mg | ORAL_TABLET | Freq: Every day | ORAL | 2 refills | Status: AC
  Filled 2024-02-11: qty 30, 30d supply, fill #0

## 2024-02-12 ENCOUNTER — Ambulatory Visit
Admission: RE | Admit: 2024-02-12 | Discharge: 2024-02-12 | Disposition: A | Source: Ambulatory Visit | Attending: Radiation Oncology

## 2024-02-12 ENCOUNTER — Other Ambulatory Visit: Payer: Self-pay

## 2024-02-12 LAB — RAD ONC ARIA SESSION SUMMARY
Course Elapsed Days: 18
Plan Fractions Treated to Date: 15
Plan Prescribed Dose Per Fraction: 2.5 Gy
Plan Total Fractions Prescribed: 28
Plan Total Prescribed Dose: 70 Gy
Reference Point Dosage Given to Date: 37.5 Gy
Reference Point Session Dosage Given: 2.5 Gy
Session Number: 15

## 2024-02-15 ENCOUNTER — Ambulatory Visit

## 2024-02-16 ENCOUNTER — Ambulatory Visit

## 2024-02-16 ENCOUNTER — Encounter: Payer: Self-pay | Admitting: Family Medicine

## 2024-02-17 ENCOUNTER — Ambulatory Visit
Admission: RE | Admit: 2024-02-17 | Discharge: 2024-02-17 | Disposition: A | Source: Ambulatory Visit | Attending: Radiation Oncology

## 2024-02-17 ENCOUNTER — Other Ambulatory Visit: Payer: Self-pay

## 2024-02-17 LAB — RAD ONC ARIA SESSION SUMMARY
Course Elapsed Days: 23
Plan Fractions Treated to Date: 16
Plan Prescribed Dose Per Fraction: 2.5 Gy
Plan Total Fractions Prescribed: 28
Plan Total Prescribed Dose: 70 Gy
Reference Point Dosage Given to Date: 40 Gy
Reference Point Session Dosage Given: 2.5 Gy
Session Number: 16

## 2024-02-18 ENCOUNTER — Ambulatory Visit
Admission: RE | Admit: 2024-02-18 | Discharge: 2024-02-18 | Disposition: A | Discharge status: Home / Self Care | Source: Ambulatory Visit | Attending: Radiation Oncology | Admitting: Radiation Oncology

## 2024-02-18 ENCOUNTER — Other Ambulatory Visit: Payer: Self-pay

## 2024-02-18 LAB — RAD ONC ARIA SESSION SUMMARY
Course Elapsed Days: 24
Plan Fractions Treated to Date: 17
Plan Prescribed Dose Per Fraction: 2.5 Gy
Plan Total Fractions Prescribed: 28
Plan Total Prescribed Dose: 70 Gy
Reference Point Dosage Given to Date: 42.5 Gy
Reference Point Session Dosage Given: 2.5 Gy
Session Number: 17

## 2024-02-19 ENCOUNTER — Ambulatory Visit
Admission: RE | Admit: 2024-02-19 | Discharge: 2024-02-19 | Disposition: A | Source: Ambulatory Visit | Attending: Radiation Oncology

## 2024-02-19 ENCOUNTER — Encounter: Payer: Self-pay | Admitting: Hematology and Oncology

## 2024-02-19 ENCOUNTER — Other Ambulatory Visit: Payer: Self-pay

## 2024-02-19 LAB — RAD ONC ARIA SESSION SUMMARY
Course Elapsed Days: 25
Plan Fractions Treated to Date: 18
Plan Prescribed Dose Per Fraction: 2.5 Gy
Plan Total Fractions Prescribed: 28
Plan Total Prescribed Dose: 70 Gy
Reference Point Dosage Given to Date: 45 Gy
Reference Point Session Dosage Given: 2.5 Gy
Session Number: 18

## 2024-02-22 ENCOUNTER — Ambulatory Visit

## 2024-02-22 ENCOUNTER — Other Ambulatory Visit (HOSPITAL_BASED_OUTPATIENT_CLINIC_OR_DEPARTMENT_OTHER): Payer: Self-pay

## 2024-02-22 ENCOUNTER — Encounter: Payer: Self-pay | Admitting: Hematology and Oncology

## 2024-02-23 ENCOUNTER — Other Ambulatory Visit: Payer: Self-pay

## 2024-02-23 ENCOUNTER — Ambulatory Visit: Admission: RE | Admit: 2024-02-23 | Discharge: 2024-02-23 | Attending: Radiation Oncology

## 2024-02-23 LAB — RAD ONC ARIA SESSION SUMMARY
Course Elapsed Days: 29
Plan Fractions Treated to Date: 19
Plan Prescribed Dose Per Fraction: 2.5 Gy
Plan Total Fractions Prescribed: 28
Plan Total Prescribed Dose: 70 Gy
Reference Point Dosage Given to Date: 47.5 Gy
Reference Point Session Dosage Given: 2.5 Gy
Session Number: 19

## 2024-02-24 ENCOUNTER — Other Ambulatory Visit: Payer: Self-pay

## 2024-02-24 ENCOUNTER — Ambulatory Visit
Admission: RE | Admit: 2024-02-24 | Discharge: 2024-02-24 | Disposition: A | Source: Ambulatory Visit | Attending: Radiation Oncology

## 2024-02-24 LAB — RAD ONC ARIA SESSION SUMMARY
Course Elapsed Days: 30
Plan Fractions Treated to Date: 20
Plan Prescribed Dose Per Fraction: 2.5 Gy
Plan Total Fractions Prescribed: 28
Plan Total Prescribed Dose: 70 Gy
Reference Point Dosage Given to Date: 50 Gy
Reference Point Session Dosage Given: 2.5 Gy
Session Number: 20

## 2024-02-25 ENCOUNTER — Other Ambulatory Visit: Payer: Self-pay

## 2024-02-25 ENCOUNTER — Encounter: Payer: Self-pay | Admitting: Hematology and Oncology

## 2024-02-25 ENCOUNTER — Ambulatory Visit

## 2024-02-25 LAB — RAD ONC ARIA SESSION SUMMARY
Course Elapsed Days: 31
Plan Fractions Treated to Date: 21
Plan Prescribed Dose Per Fraction: 2.5 Gy
Plan Total Fractions Prescribed: 28
Plan Total Prescribed Dose: 70 Gy
Reference Point Dosage Given to Date: 52.5 Gy
Reference Point Session Dosage Given: 2.5 Gy
Session Number: 21

## 2024-02-26 ENCOUNTER — Other Ambulatory Visit: Payer: Self-pay

## 2024-02-26 ENCOUNTER — Ambulatory Visit: Admission: RE | Admit: 2024-02-26

## 2024-02-26 LAB — RAD ONC ARIA SESSION SUMMARY
Course Elapsed Days: 32
Plan Fractions Treated to Date: 22
Plan Prescribed Dose Per Fraction: 2.5 Gy
Plan Total Fractions Prescribed: 28
Plan Total Prescribed Dose: 70 Gy
Reference Point Dosage Given to Date: 55 Gy
Reference Point Session Dosage Given: 2.5 Gy
Session Number: 22

## 2024-02-29 ENCOUNTER — Ambulatory Visit

## 2024-03-01 ENCOUNTER — Ambulatory Visit

## 2024-03-02 ENCOUNTER — Ambulatory Visit

## 2024-03-03 ENCOUNTER — Ambulatory Visit

## 2024-03-04 ENCOUNTER — Ambulatory Visit

## 2024-03-07 ENCOUNTER — Ambulatory Visit

## 2024-04-13 ENCOUNTER — Inpatient Hospital Stay

## 2024-08-01 ENCOUNTER — Inpatient Hospital Stay: Admitting: Hematology and Oncology

## 2024-08-01 ENCOUNTER — Inpatient Hospital Stay
